# Patient Record
Sex: Female | Born: 1950 | Race: White | Hispanic: No | Marital: Married | Smoking: Never smoker
Health system: Southern US, Community
[De-identification: ages and names within clinical notes are randomized; demographics above are authoritative.]

## PROBLEM LIST (undated history)

## (undated) DIAGNOSIS — E119 Type 2 diabetes mellitus without complications: Secondary | ICD-10-CM

## (undated) DIAGNOSIS — IMO0001 Reserved for inherently not codable concepts without codable children: Secondary | ICD-10-CM

## (undated) DIAGNOSIS — M1990 Unspecified osteoarthritis, unspecified site: Secondary | ICD-10-CM

## (undated) DIAGNOSIS — I5189 Other ill-defined heart diseases: Secondary | ICD-10-CM

## (undated) DIAGNOSIS — F329 Major depressive disorder, single episode, unspecified: Secondary | ICD-10-CM

## (undated) DIAGNOSIS — F419 Anxiety disorder, unspecified: Secondary | ICD-10-CM

## (undated) DIAGNOSIS — Z789 Other specified health status: Secondary | ICD-10-CM

## (undated) DIAGNOSIS — I10 Essential (primary) hypertension: Secondary | ICD-10-CM

## (undated) DIAGNOSIS — G4730 Sleep apnea, unspecified: Secondary | ICD-10-CM

## (undated) DIAGNOSIS — G7000 Myasthenia gravis without (acute) exacerbation: Secondary | ICD-10-CM

## (undated) DIAGNOSIS — H269 Unspecified cataract: Secondary | ICD-10-CM

## (undated) DIAGNOSIS — F32A Depression, unspecified: Secondary | ICD-10-CM

## (undated) DIAGNOSIS — E785 Hyperlipidemia, unspecified: Secondary | ICD-10-CM

## (undated) DIAGNOSIS — T7840XA Allergy, unspecified, initial encounter: Secondary | ICD-10-CM

## (undated) DIAGNOSIS — K219 Gastro-esophageal reflux disease without esophagitis: Secondary | ICD-10-CM

## (undated) HISTORY — DX: Essential (primary) hypertension: I10

## (undated) HISTORY — PX: MANDIBLE SURGERY: SHX707

## (undated) HISTORY — DX: Unspecified osteoarthritis, unspecified site: M19.90

## (undated) HISTORY — DX: Gastro-esophageal reflux disease without esophagitis: K21.9

## (undated) HISTORY — PX: TONSILECTOMY, ADENOIDECTOMY, BILATERAL MYRINGOTOMY AND TUBES: SHX2538

## (undated) HISTORY — DX: Myasthenia gravis without (acute) exacerbation: G70.00

## (undated) HISTORY — PX: EYE SURGERY: SHX253

## (undated) HISTORY — PX: TUBAL LIGATION: SHX77

## (undated) HISTORY — PX: BUNIONECTOMY: SHX129

## (undated) HISTORY — DX: Anxiety disorder, unspecified: F41.9

## (undated) HISTORY — DX: Other specified health status: Z78.9

## (undated) HISTORY — DX: Allergy, unspecified, initial encounter: T78.40XA

## (undated) HISTORY — DX: Other ill-defined heart diseases: I51.89

## (undated) HISTORY — DX: Depression, unspecified: F32.A

## (undated) HISTORY — DX: Sleep apnea, unspecified: G47.30

## (undated) HISTORY — DX: Type 2 diabetes mellitus without complications: E11.9

## (undated) HISTORY — DX: Unspecified cataract: H26.9

## (undated) HISTORY — DX: Major depressive disorder, single episode, unspecified: F32.9

## (undated) HISTORY — PX: APPENDECTOMY: SHX54

## (undated) HISTORY — DX: Hyperlipidemia, unspecified: E78.5

## (undated) HISTORY — DX: Reserved for inherently not codable concepts without codable children: IMO0001

---

## 2014-02-25 LAB — HM COLONOSCOPY

## 2016-01-26 LAB — HM MAMMOGRAPHY

## 2017-08-15 ENCOUNTER — Encounter: Payer: Self-pay | Admitting: Family Medicine

## 2017-10-30 ENCOUNTER — Other Ambulatory Visit: Payer: Self-pay

## 2017-10-30 ENCOUNTER — Encounter: Payer: Self-pay | Admitting: Family Medicine

## 2017-10-30 ENCOUNTER — Ambulatory Visit (INDEPENDENT_AMBULATORY_CARE_PROVIDER_SITE_OTHER): Payer: Medicare Other | Admitting: Family Medicine

## 2017-10-30 VITALS — BP 134/72 | HR 70 | Temp 98.2°F | Resp 16 | Ht 63.0 in | Wt 199.0 lb

## 2017-10-30 DIAGNOSIS — E782 Mixed hyperlipidemia: Secondary | ICD-10-CM | POA: Diagnosis not present

## 2017-10-30 DIAGNOSIS — E119 Type 2 diabetes mellitus without complications: Secondary | ICD-10-CM | POA: Diagnosis not present

## 2017-10-30 DIAGNOSIS — I519 Heart disease, unspecified: Secondary | ICD-10-CM | POA: Diagnosis not present

## 2017-10-30 DIAGNOSIS — Z6835 Body mass index (BMI) 35.0-35.9, adult: Secondary | ICD-10-CM

## 2017-10-30 DIAGNOSIS — G4730 Sleep apnea, unspecified: Secondary | ICD-10-CM | POA: Diagnosis not present

## 2017-10-30 DIAGNOSIS — E1169 Type 2 diabetes mellitus with other specified complication: Secondary | ICD-10-CM | POA: Insufficient documentation

## 2017-10-30 DIAGNOSIS — D229 Melanocytic nevi, unspecified: Secondary | ICD-10-CM | POA: Diagnosis not present

## 2017-10-30 DIAGNOSIS — Z0000 Encounter for general adult medical examination without abnormal findings: Secondary | ICD-10-CM | POA: Diagnosis not present

## 2017-10-30 DIAGNOSIS — Z789 Other specified health status: Secondary | ICD-10-CM

## 2017-10-30 DIAGNOSIS — I10 Essential (primary) hypertension: Secondary | ICD-10-CM | POA: Diagnosis not present

## 2017-10-30 DIAGNOSIS — R8290 Unspecified abnormal findings in urine: Secondary | ICD-10-CM | POA: Diagnosis not present

## 2017-10-30 DIAGNOSIS — F339 Major depressive disorder, recurrent, unspecified: Secondary | ICD-10-CM | POA: Diagnosis not present

## 2017-10-30 DIAGNOSIS — R1310 Dysphagia, unspecified: Secondary | ICD-10-CM

## 2017-10-30 DIAGNOSIS — G7000 Myasthenia gravis without (acute) exacerbation: Secondary | ICD-10-CM | POA: Diagnosis not present

## 2017-10-30 DIAGNOSIS — E669 Obesity, unspecified: Secondary | ICD-10-CM | POA: Insufficient documentation

## 2017-10-30 DIAGNOSIS — IMO0001 Reserved for inherently not codable concepts without codable children: Secondary | ICD-10-CM | POA: Insufficient documentation

## 2017-10-30 DIAGNOSIS — I5189 Other ill-defined heart diseases: Secondary | ICD-10-CM | POA: Insufficient documentation

## 2017-10-30 DIAGNOSIS — E785 Hyperlipidemia, unspecified: Secondary | ICD-10-CM | POA: Insufficient documentation

## 2017-10-30 LAB — URINALYSIS, ROUTINE W REFLEX MICROSCOPIC
BACTERIA UA: NONE SEEN /HPF
Bilirubin Urine: NEGATIVE
GLUCOSE, UA: NEGATIVE
HGB URINE DIPSTICK: NEGATIVE
KETONES UR: NEGATIVE
NITRITE: NEGATIVE
PH: 7 (ref 5.0–8.0)
PROTEIN: NEGATIVE
RBC / HPF: NONE SEEN /HPF (ref 0–2)
Specific Gravity, Urine: 1.015 (ref 1.001–1.03)

## 2017-10-30 LAB — MICROSCOPIC MESSAGE

## 2017-10-30 MED ORDER — LOSARTAN POTASSIUM 25 MG PO TABS: 25.0000 mg | ORAL_TABLET | Freq: Every day | ORAL | 1 refills | Status: DC

## 2017-10-30 MED ORDER — BUPROPION HCL ER (XL) 150 MG PO TB24: ORAL_TABLET | ORAL | 1 refills | Status: DC

## 2017-10-30 MED ORDER — METFORMIN HCL ER 500 MG PO TB24: 500.0000 mg | ORAL_TABLET | Freq: Every day | ORAL | 1 refills | Status: DC

## 2017-10-30 MED ORDER — METOPROLOL SUCCINATE ER 25 MG PO TB24: 25.0000 mg | ORAL_TABLET | Freq: Every day | ORAL | 3 refills | Status: DC

## 2017-10-30 MED ORDER — HYOSCYAMINE SULFATE 0.125 MG SL SUBL
0.1250 mg | SUBLINGUAL_TABLET | Freq: Four times a day (QID) | SUBLINGUAL | 0 refills | Status: DC | PRN
Start: 2017-10-30 — End: 2018-02-06

## 2017-10-30 MED ORDER — BLOOD GLUCOSE SYSTEM PAK KIT: PACK | 1 refills | Status: DC

## 2017-10-30 MED ORDER — BLOOD GLUCOSE TEST VI STRP
ORAL_STRIP | 1 refills | Status: DC
Start: 2017-10-30 — End: 2018-04-07

## 2017-10-30 NOTE — Assessment & Plan Note (Signed)
Sleep apnea referral to the pulmonary continue with CPAP therapy.

## 2017-10-30 NOTE — Patient Instructions (Addendum)
Previous PCP - [facility]- Dr. Donneta [facility]berg[facility] [place] We will call with urine results  Referral to GI  Referral to Dermatology  Dr. [place] Crane  Referral to Dr. Halford Chessman ( April)  New diabetes meter  email your [email or web address] F/U 3 months

## 2017-10-30 NOTE — Assessment & Plan Note (Signed)
Diabetes mellitus we will check her fasting fasting labs.  She is currently on metformin.  She is open to going back on statin if needed.  She does have underlying hyperlipidemia.

## 2017-10-30 NOTE — Progress Notes (Signed)
Subjective:   Patient presents for Medicare Annual/Subsequent preventive examination.    Pt here for annual wellness and to establish care  Previous PCP - [name]- [facility] [name]   Cardiologist- Diagnosed with diastolic dysfunction, after episodes of chest pain, had Echo and stress test which did not show any CAD.  Was prescribed losartan and Metoprolol. Also has mild HTN, diagnosed 3 years ago      Neurology-  Seen due to Diplopia, Myasthenia Gravis diagnosed 4-5 years, was on Mestinon but stopped this year, wants to see how vision has been . Has been following with eye doctor, now has appt to establish with Dr. [name] in [place], also has dry syndrome   Over past 1-2 years, had had episodes of spasm in throat, will often choke on food or her pills  has not had EGD performed.       Dermatology- Gets skin check every 2 years, no previous cancer   Gastroenterology- Had colonoscopy  In past few years, has history of polyps, no family history of colon cancer, has chronic rectal pain, has internal hemorrhoids, given  hyoscyomine  Takes magnesium tablets for constipation     Diabetes Mellitus- taking Metformin 500mg  once a day , CBG range 135, needs new meter Hyperlipidemia- has known very high Triglycerides, comes down with diet, runs in family  Was on Livalo, stopped due to concern for side effects    Has had some cloudiness with urine and urine feels warm   Depression- intermittantly on wellbutrin for past 5 years for stressors ,currently taking     OSA- has Sleep apnea on CPAP ,needs referral to Dr. [name] on toe   History of Bells Palsy   Prevention- does Thermogram for mammography, has history cyst   Previous PAP Smears were normal , last done 1-2 years ago    Bone Density- UTD   Immunizations Declines   Review Past Medical/Family/Social: Per EMR   Risk Factors  Current exercise habits: walking  Dietary issues discussed: Yes   Cardiac risk  factors: Obesity (BMI >= 30 kg/m2). DM. Diastolic dysfunction   Depression Screen  (Note: if answer to either of the following is "Yes", a more complete depression screening is indicated)  Over the past two weeks, have you felt down, depressed or hopeless? No Over the past two weeks, have you felt little interest or pleasure in doing things? No Have you lost interest or pleasure in daily life? No Do you often feel hopeless? No Do you cry easily over simple problems? No   Activities of Daily Living  In your present state of health, do you have any difficulty performing the following activities?:  Driving? No  Managing money? No  Feeding yourself? No  Getting from bed to chair? No  Climbing a flight of stairs? No  Preparing food and eating?: No  Bathing or showering? No  Getting dressed: No  Getting to the toilet? No  Using the toilet:No  Moving around from place to place: No  In the past year have you fallen or had a near fall?:No  Are you sexually active? No  Do you have more than one partner? No   Hearing Difficulties: No  Do you often ask people to speak up or repeat themselves? No  Do you experience ringing or noises in your ears? No Do you have difficulty understanding soft or whispered voices? No  Do you feel that you have a problem with memory? No Do  you often misplace items? No  Do you feel safe at home? Yes  Cognitive Testing  Alert? Yes Normal Appearance?Yes  Oriented to person? Yes Place? Yes  Time? Yes  Recall of three objects? Yes  Can perform simple calculations? Yes  Displays appropriate judgment?Yes  Can read the correct time from a watch face?Yes      Screening Tests / Date  DECLINES SHOTS Colonoscopy UTD per patient                     Zostavax  Mammogram  Per above  Influenza Vaccine  Tetanus/tdap  ROS: GEN- denies fatigue, fever, weight loss,weakness, recent illness HEENT- denies eye drainage, change in vision, nasal discharge, CVS- denies  chest pain, palpitations RESP- denies SOB, cough, wheeze ABD- denies N/V, change in stools, abd pain GU- denies dysuria, hematuria, dribbling, incontinence MSK- denies joint pain, muscle aches, injury Neuro- denies headache, dizziness, syncope, seizure activity  PHYSICAL: GEN- NAD, alert and oriented x3 HEENT- PERRL, EOMI, non injected sclera, pink conjunctiva, MMM, oropharynx clear Neck- Supple, no thryomegaly CVS- RRR, no murmur RESP-CTAB ABD-NABS,soft,NT,ND Skin- multiple Nevi, Seb Ker on back Psych- normal affect and mood  EXT- No edema, Right foot- corn 3rd digit mild TTP Pulses- Radial, DP- 2+   Assessment:    Annual wellness medicare exam   Plan:    During the course of the visit the patient was educated and counseled about appropriate screening and preventive services including:  Obtain records from previous PCP Declines immunizations.  She does not like traditional mammograms therefore will be getting thermograms.  She is going to email me the results of her previous thermograms  Diabetes mellitus we will check her fasting fasting labs.  She is currently on metformin.  She is open to going back on statin if needed.  She does have underlying hyperlipidemia.  Hypertension blood pressure is controlled.  Diastolic dysfunction no sign of heart failure.  Depression mood is stable with the Wellbutrin will continue current dose.  Sleep apnea referral to the pulmonary continue with CPAP therapy.  Multiple nevi will set up with dermatologist for her yearly checks.  She is a corn on her toe we discussed going to podiatry she wants to hold off on this.  She is going to use moleskin she has already try the salicylic acid it has not helped.  Concerned about the dysphagia, possible releated to Myasthenia, but needs EGD , may have stricture, or barrets   Advanced directives placed on file, Jehovah Witness  Diet review for nutrition referral? Yes ____ Not Indicated __x__   Patient Instructions (the written plan) was given to the patient.  Medicare Attestation  I have personally reviewed:  The patient's medical and social history  Their use of alcohol, tobacco or illicit drugs  Their current medications and supplements  The patient's functional ability including ADLs,fall risks, home safety risks, cognitive, and hearing and visual impairment  Diet and physical activities  Evidence for depression or mood disorders  The patient's weight, height, BMI, and visual acuity have been recorded in the chart. I have made referrals, counseling, and provided education to the patient based on review of the above and I have provided the patient with a written personalized care plan for preventive services.

## 2017-10-30 NOTE — Assessment & Plan Note (Signed)
Depression mood is stable with the Wellbutrin will continue current dose.

## 2017-10-30 NOTE — Assessment & Plan Note (Signed)
Diastolic dysfunction no sign of heart failure.

## 2017-10-31 LAB — COMPREHENSIVE METABOLIC PANEL
AG RATIO: 2.1 (calc) (ref 1.0–2.5)
ALKALINE PHOSPHATASE (APISO): 74 U/L (ref 33–130)
ALT: 23 U/L (ref 6–29)
AST: 22 U/L (ref 10–35)
Albumin: 4.6 g/dL (ref 3.6–5.1)
BILIRUBIN TOTAL: 0.6 mg/dL (ref 0.2–1.2)
BUN: 10 mg/dL (ref 7–25)
CALCIUM: 9.6 mg/dL (ref 8.6–10.4)
CO2: 26 mmol/L (ref 20–32)
Chloride: 103 mmol/L (ref 98–110)
Creat: 0.88 mg/dL (ref 0.50–0.99)
Globulin: 2.2 g/dL (calc) (ref 1.9–3.7)
Glucose, Bld: 111 mg/dL — ABNORMAL HIGH (ref 65–99)
POTASSIUM: 4 mmol/L (ref 3.5–5.3)
SODIUM: 141 mmol/L (ref 135–146)
TOTAL PROTEIN: 6.8 g/dL (ref 6.1–8.1)

## 2017-10-31 LAB — CBC WITH DIFFERENTIAL/PLATELET
BASOS PCT: 0.9 %
Basophils Absolute: 50 cells/uL (ref 0–200)
EOS PCT: 2.8 %
Eosinophils Absolute: 157 cells/uL (ref 15–500)
HEMATOCRIT: 41.9 % (ref 35.0–45.0)
Hemoglobin: 14.6 g/dL (ref 11.7–15.5)
LYMPHS ABS: 1770 {cells}/uL (ref 850–3900)
MCH: 31.8 pg (ref 27.0–33.0)
MCHC: 34.8 g/dL (ref 32.0–36.0)
MCV: 91.3 fL (ref 80.0–100.0)
MPV: 9.6 fL (ref 7.5–12.5)
Monocytes Relative: 9.1 %
NEUTROS ABS: 3114 {cells}/uL (ref 1500–7800)
NEUTROS PCT: 55.6 %
Platelets: 231 10*3/uL (ref 140–400)
RBC: 4.59 10*6/uL (ref 3.80–5.10)
RDW: 12.7 % (ref 11.0–15.0)
Total Lymphocyte: 31.6 %
WBC mixed population: 510 cells/uL (ref 200–950)
WBC: 5.6 10*3/uL (ref 3.8–10.8)

## 2017-10-31 LAB — URINE CULTURE
MICRO NUMBER: 81463529
SPECIMEN QUALITY: ADEQUATE

## 2017-10-31 LAB — HEMOGLOBIN A1C
Hgb A1c MFr Bld: 5.8 % of total Hgb — ABNORMAL HIGH (ref <5.7)
Mean Plasma Glucose: 120 (calc)
eAG (mmol/L): 6.6 (calc)

## 2017-10-31 LAB — SPECIMEN COMPROMISED

## 2017-10-31 LAB — LIPID PANEL
CHOL/HDL RATIO: 5.3 (calc) — AB (ref <5.0)
Cholesterol: 210 mg/dL — ABNORMAL HIGH (ref <200)
HDL: 40 mg/dL — ABNORMAL LOW (ref >50)
LDL CHOLESTEROL (CALC): 116 mg/dL — AB
NON-HDL CHOLESTEROL (CALC): 170 mg/dL — AB (ref <130)
TRIGLYCERIDES: 377 mg/dL — AB (ref <150)

## 2017-11-01 ENCOUNTER — Other Ambulatory Visit: Payer: Self-pay | Admitting: *Deleted

## 2017-11-01 MED ORDER — PITAVASTATIN CALCIUM 2 MG PO TABS: 1.0000 | ORAL_TABLET | Freq: Two times a day (BID) | ORAL | Status: DC

## 2017-11-02 ENCOUNTER — Encounter (INDEPENDENT_AMBULATORY_CARE_PROVIDER_SITE_OTHER): Payer: Self-pay | Admitting: Internal Medicine

## 2017-11-03 ENCOUNTER — Encounter: Payer: Self-pay | Admitting: Family Medicine

## 2017-11-07 DIAGNOSIS — H5213 Myopia, bilateral: Secondary | ICD-10-CM | POA: Diagnosis not present

## 2017-11-07 DIAGNOSIS — E119 Type 2 diabetes mellitus without complications: Secondary | ICD-10-CM | POA: Diagnosis not present

## 2017-11-07 DIAGNOSIS — Z7984 Long term (current) use of oral hypoglycemic drugs: Secondary | ICD-10-CM | POA: Diagnosis not present

## 2017-11-07 DIAGNOSIS — H52203 Unspecified astigmatism, bilateral: Secondary | ICD-10-CM | POA: Diagnosis not present

## 2017-11-07 DIAGNOSIS — H2513 Age-related nuclear cataract, bilateral: Secondary | ICD-10-CM | POA: Diagnosis not present

## 2017-11-16 ENCOUNTER — Encounter: Payer: Self-pay | Admitting: *Deleted

## 2017-11-16 DIAGNOSIS — Z1283 Encounter for screening for malignant neoplasm of skin: Secondary | ICD-10-CM | POA: Diagnosis not present

## 2017-11-16 DIAGNOSIS — M7130 Other bursal cyst, unspecified site: Secondary | ICD-10-CM | POA: Diagnosis not present

## 2017-11-16 DIAGNOSIS — D225 Melanocytic nevi of trunk: Secondary | ICD-10-CM | POA: Diagnosis not present

## 2017-11-16 DIAGNOSIS — L820 Inflamed seborrheic keratosis: Secondary | ICD-10-CM | POA: Diagnosis not present

## 2017-12-12 ENCOUNTER — Encounter: Payer: Self-pay | Admitting: Family Medicine

## 2017-12-12 ENCOUNTER — Ambulatory Visit (INDEPENDENT_AMBULATORY_CARE_PROVIDER_SITE_OTHER): Payer: Medicare Other | Admitting: Family Medicine

## 2017-12-12 ENCOUNTER — Other Ambulatory Visit: Payer: Self-pay

## 2017-12-12 ENCOUNTER — Ambulatory Visit (HOSPITAL_COMMUNITY)
Admission: RE | Admit: 2017-12-12 | Discharge: 2017-12-12 | Disposition: A | Discharge status: Home / Self Care | Payer: Medicare Other | Source: Ambulatory Visit | Attending: Family Medicine | Admitting: Family Medicine

## 2017-12-12 VITALS — BP 130/72 | HR 82 | Temp 98.1°F | Resp 16 | Ht 63.0 in | Wt 199.0 lb

## 2017-12-12 DIAGNOSIS — R0781 Pleurodynia: Secondary | ICD-10-CM | POA: Insufficient documentation

## 2017-12-12 DIAGNOSIS — R0789 Other chest pain: Secondary | ICD-10-CM

## 2017-12-12 MED ORDER — TIZANIDINE HCL 4 MG PO TABS: 4.0000 mg | ORAL_TABLET | Freq: Four times a day (QID) | ORAL | 0 refills | Status: DC | PRN

## 2017-12-12 MED ORDER — NAPROXEN 500 MG PO TABS
500.0000 mg | ORAL_TABLET | Freq: Two times a day (BID) | ORAL | 0 refills | Status: DC
Start: 2017-12-12 — End: 2017-12-19

## 2017-12-12 NOTE — Patient Instructions (Signed)
Get xray of ribs/lungs

## 2017-12-12 NOTE — Progress Notes (Signed)
    Subjective:    Patient ID: [name], female    DOB: May 15, 1951, 67 y.o.   MRN: [id number]  Patient presents for Rib Pain (x1week- mostly on L side, but is on R side as well)  Left side and back pain for the past 3 weeks, mostly over her ribs,  past few days pain has been constant   No fall, npo injury, , minimal coughing She has also had intermittant pain on right side in same region   No OTC meds taken , no use of heat or ice   She does get some pain when she takes a deep breath in the same area     Review Of Systems:  GEN- denies fatigue, fever, weight loss,weakness, recent illness HEENT- denies eye drainage, change in vision, nasal discharge, CVS- denies chest pain, palpitations RESP- denies SOB, cough, wheeze ABD- denies N/V, change in stools, abd pain GU- denies dysuria, hematuria, dribbling, incontinence MSK- + joint pain, muscle aches, injury Neuro- denies headache, dizziness, syncope, seizure activity       Objective:    BP 130/72   Pulse 82   Temp 98.1 F (36.7 C) (Oral)   Resp 16   Ht 5\' 3"  (1.6 m)   Wt 199 lb (90.3 kg)   SpO2 98%   BMI 35.25 kg/m  GEN- NAD, alert and oriented x3 CVS- RRR, no murmur RESP-CTAB MSK- TTP post left lower ribs around T10, point tenderness posteriorly, mild TTP anterior along same bone, non tender on right side, ABD-NABS,soft,NT,ND MSK- Spine NT, neg SLR EXT- No edema Pulses- Radial 2+        Assessment & Plan:      Problem List Items Addressed This Visit    None    Visit Diagnoses    Rib pain on left side    -  Primary   MSK pain greater on left side with point tenderness and no injury, obtain Xray of ribs, with pain with SOB, may be some spliting, will view lungs as well, treatment pending evaluation   Relevant Orders   DG Ribs Unilateral Left   DG Chest 2 View   Pain, chest wall       Relevant Orders   DG Chest 2 View      Note: This dictation was prepared with Dragon dictation along with  smaller phrase technology. Any transcriptional errors that result from this process are unintentional.

## 2017-12-12 NOTE — Addendum Note (Signed)
Addended by: Vic Blackbird F on: 12/12/2017 05:14 PM   Modules accepted: Orders

## 2017-12-19 ENCOUNTER — Ambulatory Visit (INDEPENDENT_AMBULATORY_CARE_PROVIDER_SITE_OTHER): Payer: Medicare Other | Admitting: Internal Medicine

## 2017-12-19 ENCOUNTER — Encounter (INDEPENDENT_AMBULATORY_CARE_PROVIDER_SITE_OTHER): Payer: Self-pay | Admitting: Internal Medicine

## 2017-12-19 ENCOUNTER — Encounter (INDEPENDENT_AMBULATORY_CARE_PROVIDER_SITE_OTHER): Payer: Self-pay | Admitting: *Deleted

## 2017-12-19 VITALS — BP 130/94 | HR 66 | Temp 98.1°F | Resp 18 | Ht 63.0 in | Wt 198.3 lb

## 2017-12-19 DIAGNOSIS — R1319 Other dysphagia: Secondary | ICD-10-CM

## 2017-12-19 DIAGNOSIS — R1310 Dysphagia, unspecified: Secondary | ICD-10-CM

## 2017-12-19 NOTE — Progress Notes (Signed)
Reason for consultation;  Throat spasm and dysphagia.  History of present illness:   Patient is 67 year old Caucasian female who is referred through courtesy of Dr. Vic Blackbird for GI evaluation. Patient states her symptoms started 1 year ago.  She has no difficulty with breakfast or lunch but at dinnertime she feels as if she has throat spasm and she is not able to swallow.  She has had few episodes of impaction relieved with regurgitation.  Usually food bolus passes down.  She experiences heartburn occasionally.  She denies chronic cough hoarseness or sore throat.  She has good appetite and has not lost any weight.  She denies melena or rectal bleeding.  Her bowels move daily. She has lost 15 pounds in last 1 year by trying. She has history of myasthenia gravis.  She was on therapy previous to be a presently on none.  She denies progressive weakness. She has a history of colonic polyps.  Her third colonoscopy was April 2015.   She takes hyoscyamine for rectal spasm. She is physically very active.  She walks 6-10 miles every week.   Current Medications: Outpatient Encounter Medications as of 12/19/2017  Medication Sig  . BIOTIN PO Take 2,500 mcg by mouth 2 (two) times daily.  . Blood Glucose Monitoring Suppl (BLOOD GLUCOSE SYSTEM PAK) KIT Use as directed to monitor FSBS 2x daily. Dx: E11.9  . buPROPion (WELLBUTRIN XL) 150 MG 24 hr tablet Wellbutrin XL 150 mg 24 hr tablet, extended release (Patient taking differently: Take 150 mg by mouth daily. Wellbutrin XL 150 mg 24 hr tablet, extended release)  . Cholecalciferol (VITAMIN D3) 1000 units CAPS Take by mouth daily.  . Glucose Blood (BLOOD GLUCOSE TEST STRIPS) STRP Use as directed to monitor FSBS 2x daily. Dx: E11.9  . hyoscyamine (LEVSIN SL) 0.125 MG SL tablet Place 1 tablet (0.125 mg total) under the tongue every 6 (six) hours as needed.  Marland Kitchen losartan (COZAAR) 25 MG tablet Take 1 tablet (25 mg total) by mouth daily.  . metoprolol succinate  (TOPROL-XL) 25 MG 24 hr tablet Take 1 tablet (25 mg total) by mouth daily.  . Multiple Vitamins-Minerals (MULTI COMPLETE PO) Take by mouth daily.  . NON FORMULARY Take by mouth daily.  . NON FORMULARY Take by mouth daily.  . NON FORMULARY Take by mouth.  . NON FORMULARY Take by mouth daily.  . NON FORMULARY Take by mouth daily.  . NON FORMULARY Take by mouth daily.  . Pitavastatin Calcium (LIVALO) 2 MG TABS Take 1 tablet (2 mg total) by mouth 2 (two) times daily.  Marland Kitchen tiZANidine (ZANAFLEX) 4 MG tablet Take 1 tablet (4 mg total) by mouth every 6 (six) hours as needed for muscle spasms. (Patient not taking: Reported on 12/19/2017)  . [DISCONTINUED] naproxen (NAPROSYN) 500 MG tablet Take 1 tablet (500 mg total) by mouth 2 (two) times daily with a meal. (Patient not taking: Reported on 12/19/2017)   No facility-administered encounter medications on file as of 12/19/2017.    Past medical history:  Diabetes mellitus diagnosed 10 years ago.  She is presently doing well on diet.  Recent A1c was 5.8. Myasthenia gravis was diagnosed 5 years ago when she presented with diplopia.  She is been off therapy for 8 months. Sleep apnea diagnosed 15 years ago. History of depression. Hypertension. Hyperlipidemia. Diastolic dysfunction.   Obesity. IBS/rectal spasm. History of colonic adenomas.  Appendectomy at age 78. Tonsillectomy at age 71. Bilateral bunionectomy 20 years ago. She also has undergone gum  surgery.   Allergies: Allergies  Allergen Reactions  . Prozac [Fluoxetine Hcl] Anaphylaxis  . Codeine Other (See Comments)    Intolerance   . Other     -mycin medications: has myasthenia gravis   . Sulfa Antibiotics Hives    Family history:   Father died of MI at age 67.. Mother lived to be 40.  She had chronic kidney disease and CHF. She has 2 brothers living.  One brother has type 1 diabetes.  One brother has been treated for lung carcinoma.  Third brother is disease.  He was  diabetic.  Social history:  Patient is married.  She has 4 children.  1 of her daughters has multiple health issues including diabetes mellitus.  She retired last year.  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  She has never smoked cigarettes and drinks alcohol occasionally.   Physical examination:  Blood pressure (!) 130/94, pulse 66, temperature 98.1 F (36.7 C), temperature source Oral, resp. rate 18, height '5\' 3"'$  (1.6 m), weight 198 lb 4.8 oz (89.9 kg). Patient is alert and in no acute distress. Conjunctiva is pink. Sclera is nonicteric Oropharyngeal mucosa is normal. Dentition in satisfactory condition. No neck masses or thyromegaly noted. Cardiac exam with regular rhythm normal S1 and S2. No murmur or gallop noted. Lungs are clear to auscultation. Abdomen is full.  Bowel sounds are normal.  On palpation abdomen is soft and nontender without organomegaly or masses. No LE edema or clubbing noted.  Labs/studies Results:  Lab data from 10/30/2017 reviewed WBC 5.6, H&H 14.6 and 41.9 and platelet count 231K.  Serum sodium 141, potassium 4.0, chloride 103, CO2 26, calcium 9.6 Bilirubin 0.6, AP 74, AST 22, ALT 23, total protein 6.8 with albumin of 4.6.  Hemoglobin A1c 5.9.   Assessment:  Patient is 67 year old Caucasian female who presents with 1 year history of throat spasm and dysphagia which almost always occurs with the evening meal and not with breakfast or lunch if any.  She does not have symptoms of GERD.  She has history of myasthenia gravis and presently on no therapy but not having any musculoskeletal or pharyngeal symptoms.  Nevertheless her pharyngeal/esophageal symptom may be manifestation of myasthenia gravis.  Less likely would be esophageal motility disorder or Zenker's diverticulum.  History of colonic polyps.  Last colonoscopy was in April  History of colonic polyps.  Last colonoscopy was in April 2015.Marland Kitchen  Next surveillance colonoscopy due in April  2020.   Recommendations:  Barium pill esophagogram. Further recommendations to follow.

## 2017-12-19 NOTE — Patient Instructions (Signed)
Barium Pill Esophagogram be scheduled. Physician will call with results of study and further recommendations.

## 2017-12-29 ENCOUNTER — Ambulatory Visit (HOSPITAL_COMMUNITY)
Admission: RE | Admit: 2017-12-29 | Discharge: 2017-12-29 | Disposition: A | Discharge status: Home / Self Care | Payer: Medicare Other | Source: Ambulatory Visit | Attending: Internal Medicine | Admitting: Internal Medicine

## 2017-12-29 DIAGNOSIS — K224 Dyskinesia of esophagus: Secondary | ICD-10-CM | POA: Diagnosis not present

## 2017-12-29 DIAGNOSIS — R1310 Dysphagia, unspecified: Secondary | ICD-10-CM | POA: Diagnosis not present

## 2017-12-29 DIAGNOSIS — R1319 Other dysphagia: Secondary | ICD-10-CM

## 2017-12-29 DIAGNOSIS — T17308A Unspecified foreign body in larynx causing other injury, initial encounter: Secondary | ICD-10-CM | POA: Diagnosis not present

## 2018-01-01 ENCOUNTER — Other Ambulatory Visit (INDEPENDENT_AMBULATORY_CARE_PROVIDER_SITE_OTHER): Payer: Self-pay | Admitting: Internal Medicine

## 2018-01-01 MED ORDER — PANTOPRAZOLE SODIUM 40 MG PO TBEC: 40.0000 mg | DELAYED_RELEASE_TABLET | Freq: Every day | ORAL | 5 refills | Status: DC

## 2018-01-29 ENCOUNTER — Ambulatory Visit: Payer: Medicare Other | Admitting: Family Medicine

## 2018-02-06 ENCOUNTER — Encounter: Payer: Self-pay | Admitting: Family Medicine

## 2018-02-06 ENCOUNTER — Other Ambulatory Visit: Payer: Self-pay

## 2018-02-06 ENCOUNTER — Ambulatory Visit (INDEPENDENT_AMBULATORY_CARE_PROVIDER_SITE_OTHER): Payer: Medicare Other | Admitting: Family Medicine

## 2018-02-06 VITALS — BP 128/64 | HR 80 | Temp 98.3°F | Resp 14 | Ht 63.0 in | Wt 200.0 lb

## 2018-02-06 DIAGNOSIS — F339 Major depressive disorder, recurrent, unspecified: Secondary | ICD-10-CM | POA: Diagnosis not present

## 2018-02-06 DIAGNOSIS — E119 Type 2 diabetes mellitus without complications: Secondary | ICD-10-CM

## 2018-02-06 DIAGNOSIS — Z6835 Body mass index (BMI) 35.0-35.9, adult: Secondary | ICD-10-CM | POA: Diagnosis not present

## 2018-02-06 DIAGNOSIS — K224 Dyskinesia of esophagus: Secondary | ICD-10-CM | POA: Diagnosis not present

## 2018-02-06 DIAGNOSIS — I10 Essential (primary) hypertension: Secondary | ICD-10-CM | POA: Diagnosis not present

## 2018-02-06 DIAGNOSIS — E782 Mixed hyperlipidemia: Secondary | ICD-10-CM | POA: Diagnosis not present

## 2018-02-06 DIAGNOSIS — R5383 Other fatigue: Secondary | ICD-10-CM | POA: Diagnosis not present

## 2018-02-06 NOTE — Progress Notes (Signed)
   Subjective:    Patient ID: [name], female    DOB: 05-Dec-1950, 67 y.o.   MRN: [id number]  Patient presents for Follow-up (is not fasting)  Here to follow-up chronic medical problems.  She establish care back in December Diabetes mellitus her last A1c was 5.8% in January so I discontinue the metformin she is not on any current medications is diet controlled  Hypertension blood pressure has been well controlled she is taking losartan 25 mg as well as metoprolol  Depression she is maintained on Wellbutrin which helps, but always tired, daughter has thyroid cancer, would like labs done  Lipidemia she is taking Livalo as prescribed now, last LDL 116 TG 377 in Jan   Eye exam UTD- Dr. Griffin Dakin by Dr. Laural Golden for eshopheal dysmotlity Given Protonix made her nauseous   Has appt in a few weeks for Sleep apnea   Thermogram done for breast cancer screening    Review Of Systems:  GEN- +fatigue, denies  fever, weight loss,weakness, recent illness HEENT- denies eye drainage, change in vision, nasal discharge, CVS- denies chest pain, palpitations RESP- denies SOB, cough, wheeze ABD- denies N/V, change in stools, abd pain GU- denies dysuria, hematuria, dribbling, incontinence MSK- denies joint pain, muscle aches, injury Neuro- denies headache, dizziness, syncope, seizure activity       Objective:    BP 128/64   Pulse 80   Temp 98.3 F (36.8 C) (Oral)   Resp 14   Ht 5\' 3"  (1.6 m)   Wt 200 lb (90.7 kg)   SpO2 97%   BMI 35.43 kg/m  GEN- NAD, alert and oriented x3 HEENT- PERRL, EOMI, non injected sclera, pink conjunctiva, MMM, oropharynx clear Neck- Supple, no thyromegaly CVS- RRR, no murmur RESP-CTAB Psych- normal affect and mood  EXT- No edema Pulses- Radial, DP- 2+        Assessment & Plan:   She will get me a copy of colonoscopy report    Problem List Items Addressed This Visit      Unprioritized   Hyperlipidemia   Relevant Orders   Lipid panel   Obesity    Continue work on dietary change,s lower carb, would benefit from some weight loss      Hypertension    Controlled For fatigue will check labs       Relevant Orders   TSH   Esophageal dysmotility    Advised she can try zantac until she contacts her GI      Diabetes mellitus, type II ([place]) - Primary    Obtain A1C off the metformin, and lipids, return for fasting labs       Relevant Orders   CBC with Differential/Platelet   Comprehensive metabolic panel   Hemoglobin A1c   [facility] DIABETES FOOT EXAM (Completed)   Depression, recurrent ([place])    Continue wellbutrin overall doing well       Other Visit Diagnoses    Other fatigue       Relevant Orders   TSH   T3, reverse   T3, free   T4, free      Note: This dictation was prepared with Dragon dictation along with smaller phrase technology. Any transcriptional errors that result from this process are unintentional.

## 2018-02-06 NOTE — Patient Instructions (Addendum)
Try the zantac 150mg  Return for fasting labs  We will do prior authorization for the wellbutrin F/U 4 months

## 2018-02-07 ENCOUNTER — Encounter: Payer: Self-pay | Admitting: Family Medicine

## 2018-02-07 ENCOUNTER — Other Ambulatory Visit: Payer: Medicare Other

## 2018-02-07 DIAGNOSIS — I10 Essential (primary) hypertension: Secondary | ICD-10-CM | POA: Diagnosis not present

## 2018-02-07 DIAGNOSIS — E782 Mixed hyperlipidemia: Secondary | ICD-10-CM | POA: Diagnosis not present

## 2018-02-07 DIAGNOSIS — R5383 Other fatigue: Secondary | ICD-10-CM | POA: Diagnosis not present

## 2018-02-07 DIAGNOSIS — K224 Dyskinesia of esophagus: Secondary | ICD-10-CM | POA: Insufficient documentation

## 2018-02-07 DIAGNOSIS — E119 Type 2 diabetes mellitus without complications: Secondary | ICD-10-CM | POA: Diagnosis not present

## 2018-02-07 NOTE — Assessment & Plan Note (Signed)
Controlled For fatigue will check labs

## 2018-02-07 NOTE — Assessment & Plan Note (Signed)
Advised she can try zantac until she contacts her GI

## 2018-02-07 NOTE — Assessment & Plan Note (Signed)
Continue work on dietary change,s lower carb, would benefit from some weight loss

## 2018-02-07 NOTE — Assessment & Plan Note (Signed)
Continue wellbutrin overall doing well

## 2018-02-07 NOTE — Assessment & Plan Note (Signed)
Obtain A1C off the metformin, and lipids, return for fasting labs

## 2018-02-10 LAB — COMPREHENSIVE METABOLIC PANEL
AG Ratio: 2.9 (calc) — ABNORMAL HIGH (ref 1.0–2.5)
ALT: 27 U/L (ref 6–29)
AST: 24 U/L (ref 10–35)
Albumin: 4.7 g/dL (ref 3.6–5.1)
Alkaline phosphatase (APISO): 71 U/L (ref 33–130)
BUN: 11 mg/dL (ref 7–25)
CALCIUM: 9.4 mg/dL (ref 8.6–10.4)
CO2: 30 mmol/L (ref 20–32)
Chloride: 103 mmol/L (ref 98–110)
Creat: 0.85 mg/dL (ref 0.50–0.99)
Globulin: 1.6 g/dL (calc) — ABNORMAL LOW (ref 1.9–3.7)
Glucose, Bld: 106 mg/dL — ABNORMAL HIGH (ref 65–99)
Potassium: 4.5 mmol/L (ref 3.5–5.3)
SODIUM: 141 mmol/L (ref 135–146)
TOTAL PROTEIN: 6.3 g/dL (ref 6.1–8.1)
Total Bilirubin: 0.5 mg/dL (ref 0.2–1.2)

## 2018-02-10 LAB — CBC WITH DIFFERENTIAL/PLATELET
Basophils Absolute: 51 cells/uL (ref 0–200)
Basophils Relative: 0.8 %
EOS PCT: 2 %
Eosinophils Absolute: 128 cells/uL (ref 15–500)
HCT: 41.9 % (ref 35.0–45.0)
Hemoglobin: 14.7 g/dL (ref 11.7–15.5)
LYMPHS ABS: 2022 {cells}/uL (ref 850–3900)
MCH: 32.4 pg (ref 27.0–33.0)
MCHC: 35.1 g/dL (ref 32.0–36.0)
MCV: 92.3 fL (ref 80.0–100.0)
MPV: 9.4 fL (ref 7.5–12.5)
Monocytes Relative: 8.6 %
NEUTROS PCT: 57 %
Neutro Abs: 3648 cells/uL (ref 1500–7800)
PLATELETS: 240 10*3/uL (ref 140–400)
RBC: 4.54 10*6/uL (ref 3.80–5.10)
RDW: 12.4 % (ref 11.0–15.0)
Total Lymphocyte: 31.6 %
WBC mixed population: 550 cells/uL (ref 200–950)
WBC: 6.4 10*3/uL (ref 3.8–10.8)

## 2018-02-10 LAB — T3, FREE: T3, Free: 2.7 pg/mL (ref 2.3–4.2)

## 2018-02-10 LAB — T4, FREE: Free T4: 1 ng/dL (ref 0.8–1.8)

## 2018-02-10 LAB — TSH: TSH: 3.63 m[IU]/L (ref 0.40–4.50)

## 2018-02-10 LAB — HEMOGLOBIN A1C
EAG (MMOL/L): 7 (calc)
HEMOGLOBIN A1C: 6 %{Hb} — AB (ref <5.7)
MEAN PLASMA GLUCOSE: 126 (calc)

## 2018-02-10 LAB — LIPID PANEL
Cholesterol: 164 mg/dL (ref <200)
HDL: 37 mg/dL — ABNORMAL LOW (ref >50)
Non-HDL Cholesterol (Calc): 127 mg/dL (calc) (ref <130)
Total CHOL/HDL Ratio: 4.4 (calc) (ref <5.0)
Triglycerides: 501 mg/dL — ABNORMAL HIGH (ref <150)

## 2018-02-10 LAB — T3, REVERSE: T3, Reverse: 17 ng/dL (ref 8–25)

## 2018-02-13 ENCOUNTER — Other Ambulatory Visit: Payer: Self-pay | Admitting: *Deleted

## 2018-02-13 ENCOUNTER — Telehealth (INDEPENDENT_AMBULATORY_CARE_PROVIDER_SITE_OTHER): Payer: Self-pay | Admitting: Internal Medicine

## 2018-02-13 MED ORDER — ICOSAPENT ETHYL 1 G PO CAPS: 2.0000 | ORAL_CAPSULE | Freq: Two times a day (BID) | ORAL | 3 refills | Status: DC

## 2018-02-13 NOTE — Telephone Encounter (Signed)
Patient came by office stated she needs a refill on her acid reflux medication - please call [phone]

## 2018-02-15 NOTE — Telephone Encounter (Signed)
Per Dr.Rehman the patient may try Zantac 150mg  - take 1 by mouth twice a day. Patient was called and made aware.

## 2018-02-20 ENCOUNTER — Ambulatory Visit: Payer: Medicare Other | Admitting: Pulmonary Disease

## 2018-02-20 ENCOUNTER — Encounter: Payer: Self-pay | Admitting: Pulmonary Disease

## 2018-02-20 VITALS — BP 120/86 | HR 81 | Ht 63.0 in | Wt 200.8 lb

## 2018-02-20 DIAGNOSIS — G4733 Obstructive sleep apnea (adult) (pediatric): Secondary | ICD-10-CM

## 2018-02-20 NOTE — Patient Instructions (Signed)
Will get a copy of your sleep study from [place]  Follow up in 1 year

## 2018-02-20 NOTE — Progress Notes (Signed)
[facility]  Chief Complaint  Patient presents with  . sleep consult    Pt referred by Dr. Buelah Manis MD. Pt is currently on CPAP machine, needing established since moved from [name];    Vital signs: BP 120/86 (BP Location: Left Arm, Cuff Size: Normal)   Pulse 81   Ht _0  (1.6 m)   Wt 200 lb 12.8 oz (91.1 kg)   SpO2 98%   BMI 35.57 kg/m   History of Present Illness: Carolyn [name] is a 67 y.o. female for evaluation of sleep problems.  She had sleep study while living in [place].  She was found to have sleep apnea and started on CPAP.  She needs to establish with new DME.  No issue with mask fit.  She goes to sleep between 10 and midnight.  She falls asleep after 20 minutes.  She wakes up 1 to 3  times to use the bathroom.  She takes a diuretic medication late in the evening  She gets out of bed at 7 am.  She feels tired in the morning.  She denies morning headache.  She does not use anything to help her fall sleep.  She drinks coffee in the morning to help get going.  She denies sleep walking, sleep talking, bruxism, or nightmares.  There is no history of restless legs.  She denies sleep hallucinations, sleep paralysis, or cataplexy.  The Epworth score is 10 out of 24.    Physical Exam:  General - pleasant Eyes - pupils reactive ENT - no sinus tenderness, no oral exudate, no LAN, MP 3 Cardiac - regular, no murmur Chest - no wheeze, rales Abd - soft, non tender Ext - no edema Skin - no rashes Neuro - normal strength Psych - normal mood  Assessment/Plan:  Obstructive sleep apnea. - she reports compliance with CPAP and benefit from therapy - need to set up new DME - will get copy of her CPAP download and sleep study report - We discussed how sleep apnea can affect various health problems, including risks for hypertension, cardiovascular disease, and diabetes.  We also discussed how sleep disruption can increase risks for accidents, such  as while driving.  Weight loss as a means of improving sleep apnea was also reviewed.  Additional treatment options discussed were CPAP therapy, oral appliance, and surgical intervention.  Hypertension. - advised her to try taking her diuretic medication earlier in the day so that she doesn't have to wake up as much to [place] the bathroom   Patient Instructions  Will get a copy of your sleep study from [place]  Follow up in 1 year    Chesley Mires, MD [place] 02/20/2018, 9:58 AM  Flow Sheet  Sleep tests:  Review of Systems: Constitutional: Negative for fever and unexpected weight change.  HENT: Positive for congestion. Negative for dental problem, ear pain, nosebleeds, postnasal drip, rhinorrhea, sinus pressure, sneezing, sore throat and trouble swallowing.   Eyes: Negative for redness and itching.  Respiratory: Negative for cough, chest tightness, shortness of breath and wheezing.   Cardiovascular: Negative for palpitations and leg swelling.  Gastrointestinal: Negative for nausea and vomiting.  Genitourinary: Positive for dysuria.  Musculoskeletal: Negative for joint swelling.  Skin: Negative for rash.  Allergic/Immunologic: Negative.  Negative for environmental allergies, food allergies and immunocompromised state.  Neurological: Positive for headaches.  Hematological: Does not bruise/bleed easily.  Psychiatric/Behavioral: Negative for dysphoric mood. The patient is nervous/anxious.    Past Medical  History: She  has a past medical history of Depression, Diabetes mellitus without complication ([place]), Diastolic dysfunction, Hyperlipidemia, Myasthenia gravis ([place]), Patient is Jehovah's Witness, and Sleep apnea.  Past Surgical History: She  has a past surgical history that includes Tonsilectomy, adenoidectomy, bilateral myringotomy and tubes; Appendectomy; Mandible surgery; and Bunionectomy.  Family History: Her family history includes Alcohol abuse in her  father; Arthritis in her mother; Depression in her mother; Diabetes in her brother, brother, brother, and mother; Heart disease in her brother; Heart disease (age of onset: 72) in her father; Hypertension in her father and mother.  Social History: She  reports that she has never smoked. She has never used smokeless tobacco. She reports that she drinks alcohol. She reports that she does not use drugs.  Medications: Allergies as of 02/20/2018      Reactions   Prozac [fluoxetine Hcl] Anaphylaxis   Codeine Other (See Comments)   Intolerance   Other    -mycin medications: has myasthenia gravis   Sulfa Antibiotics Hives      Medication List        Accurate as of 02/20/18  9:58 AM. Always use your most recent med list.          BIOTIN PO Take 2,500 mcg by mouth 2 (two) times daily.   Blood Glucose System Pak Kit Use as directed to monitor FSBS 2x daily. Dx: E11.9   BLOOD GLUCOSE TEST STRIPS Strp Use as directed to monitor FSBS 2x daily. Dx: E11.9   buPROPion 150 MG 24 hr tablet Commonly known as:  WELLBUTRIN XL Wellbutrin XL 150 mg 24 hr tablet, extended release   Icosapent Ethyl 1 g Caps Commonly known as:  VASCEPA Take 2 capsules (2 g total) by mouth 2 (two) times daily with a meal.   losartan 25 MG tablet Commonly known as:  COZAAR Take 1 tablet (25 mg total) by mouth daily.   metoprolol succinate 25 MG 24 hr tablet Commonly known as:  TOPROL-XL Take 1 tablet (25 mg total) by mouth daily.   MULTI COMPLETE PO Take by mouth daily.   NON FORMULARY Take by mouth daily.   NON FORMULARY Take by mouth daily.   NON FORMULARY Take by mouth.   NON FORMULARY Take by mouth daily.   NON FORMULARY Take by mouth daily.   NON FORMULARY Take by mouth daily.   Pitavastatin Calcium 2 MG Tabs Commonly known as:  LIVALO Take 1 tablet (2 mg total) by mouth 2 (two) times daily.   Vitamin D3 1000 units Caps Take by mouth daily.

## 2018-02-20 NOTE — Progress Notes (Signed)
   Subjective:    Patient ID: Carolyn [name], female    DOB: 1950/12/23, 67 y.o.   MRN: [id number]  HPI    Review of Systems  Constitutional: Negative for fever and unexpected weight change.  HENT: Positive for congestion. Negative for dental problem, ear pain, nosebleeds, postnasal drip, rhinorrhea, sinus pressure, sneezing, sore throat and trouble swallowing.   Eyes: Negative for redness and itching.  Respiratory: Negative for cough, chest tightness, shortness of breath and wheezing.   Cardiovascular: Negative for palpitations and leg swelling.  Gastrointestinal: Negative for nausea and vomiting.  Genitourinary: Positive for dysuria.  Musculoskeletal: Negative for joint swelling.  Skin: Negative for rash.  Allergic/Immunologic: Negative.  Negative for environmental allergies, food allergies and immunocompromised state.  Neurological: Positive for headaches.  Hematological: Does not bruise/bleed easily.  Psychiatric/Behavioral: Negative for dysphoric mood. The patient is nervous/anxious.        Objective:   Physical Exam        Assessment & Plan:

## 2018-02-21 ENCOUNTER — Encounter: Payer: Self-pay | Admitting: *Deleted

## 2018-02-23 ENCOUNTER — Telehealth: Payer: Self-pay | Admitting: Pulmonary Disease

## 2018-02-23 NOTE — Telephone Encounter (Signed)
Pt is returning call. Per pt, she had a HST done at a office in [place]. Pt states she has requested these records be faxed to our office. Cb is [phone].

## 2018-02-23 NOTE — Telephone Encounter (Signed)
Kelli have you received this HST? Please advise.

## 2018-02-23 NOTE — Telephone Encounter (Signed)
lmtcb for pt. Pt had consult with our office on 4/23 and I do not see where we ordered a HST or where a HST has been scanned into chart.

## 2018-02-24 ENCOUNTER — Telehealth: Payer: Self-pay | Admitting: Pulmonary Disease

## 2018-02-24 NOTE — Telephone Encounter (Signed)
CPAP 01/21/18 to 02/19/18 >> used on 30 of 30 nights with average 5 hrs 57 min.  Average AHI 0.9 with CPAP 7 cm H2O.   Please let her know that CPAP report shows good control of sleep apnea on current settings.

## 2018-02-26 NOTE — Telephone Encounter (Signed)
LMTCB x1 on preferred phone number listed for patient.  

## 2018-02-26 NOTE — Telephone Encounter (Signed)
Pt is calling back [phone]

## 2018-02-26 NOTE — Telephone Encounter (Signed)
Called and spoke with patient. She is aware. Nothing further needed.

## 2018-02-28 NOTE — Telephone Encounter (Signed)
LVM for patient regarding HST in [place]; at this time have not rec'd document at this time

## 2018-03-05 NOTE — Telephone Encounter (Signed)
LVM for patient to return call regarding we have not yet rec'd the HST [place] results at this time. X1

## 2018-03-05 NOTE — Telephone Encounter (Signed)
Kelli please advise on this, thanks.

## 2018-03-06 NOTE — Telephone Encounter (Signed)
Called and spoke with the patient regarding HST records. Pt advised to call [facility] at phone [phone]. Called and spoke with Janett Billow at [place]; with Dr. Justus Memory MD office Bennie Pierini that we are in need of copy of HST results and report for this patient. She will fax this information over to our office this week. Will follow up to see if we have rec'd this information later in the week.

## 2018-03-09 NOTE — Telephone Encounter (Signed)
No sign of HST in Dr Juanetta Gosling lookat or in [facility] - unable to speak with someone  Left detailed message informing medical records of [place] conversation earlier this week with Janett Billow at that office and that records still have not been received  Routing back to [place] to follow up

## 2018-03-12 NOTE — Telephone Encounter (Signed)
Called and spoke with [name] with [name] at phone [phone]. AdvisedSamantha that we are in need of copy of HST results from 2018 results & report for this patient. She advised that pt had in lab sleep study in 2006, and titration study in 2016. She will fax this information over to our office this morning.

## 2018-03-12 NOTE — Telephone Encounter (Signed)
rec'd fax from [facility] today regarding in lab study from 2006. Nothing further needed at this time.

## 2018-04-07 ENCOUNTER — Other Ambulatory Visit: Payer: Self-pay | Admitting: Family Medicine

## 2018-04-12 ENCOUNTER — Telehealth: Payer: Self-pay | Admitting: *Deleted

## 2018-04-12 NOTE — Telephone Encounter (Signed)
Received request from pharmacy for PA on Wellbutrin.  PA submitted.   Dx: F33.9- depression.

## 2018-04-12 NOTE — Telephone Encounter (Signed)
[facility] is reviewing your PA request. Typically an electronic response will be received within 72 hours. To check for an update later, open this request from your dashboard.    You may close this dialog and return to your dashboard to perform other tasks.

## 2018-04-16 MED ORDER — BUPROPION HCL 75 MG PO TABS: 75.0000 mg | ORAL_TABLET | Freq: Two times a day (BID) | ORAL | 3 refills | Status: DC

## 2018-04-16 NOTE — Telephone Encounter (Signed)
Call placed to patient and patient made aware.   Patient is agreeable to BID dosing with Wellbutrin SR.

## 2018-04-16 NOTE — Telephone Encounter (Signed)
Prescription sent to pharmacy.

## 2018-04-16 NOTE — Telephone Encounter (Signed)
wellbutrin 75mg  BID

## 2018-04-16 NOTE — Telephone Encounter (Signed)
Call pt, she is aware of the insurance problem The PA did not go through Does she want to keep paying for the xl OR change to the BID dosing

## 2018-04-16 NOTE — Telephone Encounter (Signed)
Received PA determination.   PA denied.   Formulary alternatives include: Bupropion SR Duloxetine Mirtazapine  MD please advise.

## 2018-04-18 ENCOUNTER — Other Ambulatory Visit: Payer: Self-pay

## 2018-04-18 ENCOUNTER — Telehealth: Payer: Self-pay | Admitting: *Deleted

## 2018-04-18 ENCOUNTER — Ambulatory Visit (INDEPENDENT_AMBULATORY_CARE_PROVIDER_SITE_OTHER): Payer: Medicare Other | Admitting: Family Medicine

## 2018-04-18 ENCOUNTER — Encounter: Payer: Self-pay | Admitting: Family Medicine

## 2018-04-18 VITALS — BP 118/68 | HR 70 | Temp 98.3°F | Resp 14 | Ht 63.0 in | Wt 203.0 lb

## 2018-04-18 DIAGNOSIS — F339 Major depressive disorder, recurrent, unspecified: Secondary | ICD-10-CM

## 2018-04-18 MED ORDER — WELLBUTRIN SR 100 MG PO TB12: 100.0000 mg | ORAL_TABLET | Freq: Two times a day (BID) | ORAL | 6 refills | Status: DC

## 2018-04-18 NOTE — Telephone Encounter (Signed)
Patient seen in office for medication management.   Appeal called to [facility] for Wellbutrin XL 150mg .

## 2018-04-18 NOTE — Progress Notes (Signed)
   Subjective:    Patient ID: Carolyn [name], female    DOB: 11/16/1950, 67 y.o.   MRN: [id number]  Patient presents for Medication Management (Welbutrin)  Here to follow-up her medications.  We have been having difficulty trying to get her brand-name Wellbutrin covered.  She has been on the extended release and has done quite well with this throughout the years however insurance will not cover.  Tried to switch her to the SR twice a day 75 mg however they did not make this in the brand-name.  With previous generic she has had failure of treatment or nausea upset stomach.  Her mood has been stable with brand-name Wellbutrin.    Started diet- the Next 56 days to help with her cholesterol and blood sugar.  We reviewed her medications.       Review Of Systems:  GEN- denies fatigue, fever, weight loss,weakness, recent illness HEENT- denies eye drainage, change in vision, nasal discharge, CVS- denies chest pain, palpitations RESP- denies SOB, cough, wheeze ABD- denies N/V, change in stools, abd pain GU- denies dysuria, hematuria, dribbling, incontinence MSK- denies joint pain, muscle aches, injury Neuro- denies headache, dizziness, syncope, seizure activity       Objective:    BP 118/68   Pulse 70   Temp 98.3 F (36.8 C) (Oral)   Resp 14   Ht 5\' 3"  (1.6 m)   Wt 203 lb (92.1 kg)   SpO2 98%   BMI 35.96 kg/m  GEN- NAD, alert and oriented x3 Psych-normal affect and mood       Assessment & Plan:      Problem List Items Addressed This Visit      Unprioritized   Depression, recurrent (HCC) - Primary    Benefit from staying on her extended release Wellbutrin 150 mg twice a day.  For some reason this is not covered after a second medical review appeal then she will take Wellbutrin brand-name 100 mg twice a day.  She is aware that this will be a slightly higher dose.      Relevant Medications   WELLBUTRIN SR 100 MG 12 hr tablet      Note: This dictation was prepared with  Dragon dictation along with smaller phrase technology. Any transcriptional errors that result from this process are unintentional.

## 2018-04-18 NOTE — Assessment & Plan Note (Signed)
Benefit from staying on her extended release Wellbutrin 150 mg twice a day.  For some reason this is not covered after a second medical review appeal then she will take Wellbutrin brand-name 100 mg twice a day.  She is aware that this will be a slightly higher dose.

## 2018-04-18 NOTE — Patient Instructions (Signed)
F/U as previous 

## 2018-04-19 MED ORDER — BUPROPION HCL ER (XL) 150 MG PO TB24: 150.0000 mg | ORAL_TABLET | Freq: Every day | ORAL | 3 refills | Status: DC

## 2018-04-19 NOTE — Telephone Encounter (Signed)
Received call from [facility].   Was advised that appeal has been approved 04/12/2018- 10/30/2018. Appeal ABB- [id number].

## 2018-04-19 NOTE — Telephone Encounter (Signed)
Call placed to pharmacy. Was advised that medication continues to reject stating PA is required.   Call placed to Prior Authorization Department at [phone]. Was advised that medication that was approved is noted as SR and not XL. New PA completed. [facility] is reviewing your PA request. Typically an electronic response will be received within 72 hours.

## 2018-04-20 NOTE — Telephone Encounter (Signed)
Received PA determination for Wellbutrin XL 150mg .   [id number] approved through 10/30/2018.  Pharmacy made aware.   Call placed to patient and patient made aware.

## 2018-04-20 NOTE — Telephone Encounter (Signed)
Received fax requesting additional information: Has patient tried and failed Bupropion XL.   Clinical information called to [facility].

## 2018-04-30 ENCOUNTER — Telehealth: Payer: Self-pay | Admitting: Pulmonary Disease

## 2018-04-30 NOTE — Telephone Encounter (Signed)
Spoke with patient. She stated that [facility] is needing a copy of her SS from 2016 to be sent to them. Advised patient that I found her SS in media and would fax it to [place]. She verbalized understanding. Nothing else needed at time of call.   Will close this encounter.

## 2018-04-30 NOTE — Telephone Encounter (Signed)
LMTCB

## 2018-05-01 NOTE — Telephone Encounter (Signed)
Called and spoke with [place] with [facility] regarding pt's cpap titration results. Carolyn [name] is needing a copy of the sleep study diagnostics results. There is no diagnostic results on the scan at this time from the cpap titration study. Carolyn [name] stated that she will call the patient with this information and let pt know. Nothing further needed at this time per [facility].

## 2018-05-01 NOTE — Telephone Encounter (Signed)
Danae Chen, [facility] returned call, [phone].

## 2018-05-01 NOTE — Telephone Encounter (Signed)
Attempted to call [facility] but no answer. Left message for them to return call.

## 2018-05-17 DIAGNOSIS — G4733 Obstructive sleep apnea (adult) (pediatric): Secondary | ICD-10-CM | POA: Diagnosis not present

## 2018-05-18 ENCOUNTER — Other Ambulatory Visit: Payer: Self-pay | Admitting: Family Medicine

## 2018-05-29 ENCOUNTER — Ambulatory Visit (INDEPENDENT_AMBULATORY_CARE_PROVIDER_SITE_OTHER): Payer: Medicare Other | Admitting: Family Medicine

## 2018-05-29 ENCOUNTER — Encounter: Payer: Self-pay | Admitting: Family Medicine

## 2018-05-29 ENCOUNTER — Other Ambulatory Visit: Payer: Self-pay

## 2018-05-29 VITALS — BP 110/62 | HR 70 | Temp 98.1°F | Resp 14 | Ht 63.0 in | Wt 200.0 lb

## 2018-05-29 DIAGNOSIS — E782 Mixed hyperlipidemia: Secondary | ICD-10-CM

## 2018-05-29 DIAGNOSIS — J0100 Acute maxillary sinusitis, unspecified: Secondary | ICD-10-CM

## 2018-05-29 MED ORDER — FLUCONAZOLE 150 MG PO TABS: 150.0000 mg | ORAL_TABLET | Freq: Once | ORAL | 0 refills | Status: AC

## 2018-05-29 MED ORDER — AMOXICILLIN 875 MG PO TABS: 875.0000 mg | ORAL_TABLET | Freq: Two times a day (BID) | ORAL | 0 refills | Status: DC

## 2018-05-29 NOTE — Patient Instructions (Addendum)
Take antibiotics Use nasal spray F/U as prevous  Schedule LAB VISIT FOR CHOLESTEROL IN NEXT 1-2 WEEKS

## 2018-05-29 NOTE — Progress Notes (Signed)
   Subjective:    Patient ID: Carolyn [name], female    DOB: 1951-04-13, 67 y.o.   MRN: [id number]  Patient presents for Illness (x weeks- sinus pressure, sore throat, HA, nasal congestion)   Sinus pain and pressure for past few weeks, sore throat and earache. Has tried OTC meds- allergy med, nasal sprays. No fever.Has mild cough from drainage.   No vomiting    Needs recheck on TG, were  501, Vascepa added to her [place]   at last check Review Of Systems:  GEN- denies fatigue, fever, weight loss,weakness, recent illness HEENT- denies eye drainage, change in vision,+ nasal discharge, CVS- denies chest pain, palpitations RESP- denies SOB, cough, wheeze ABD- denies N/V, change in stools, abd pain GU- denies dysuria, hematuria, dribbling, incontinence MSK- denies joint pain, muscle aches, injury Neuro- denies headache, dizziness, syncope, seizure activity       Objective:    BP 110/62   Pulse 70   Temp 98.1 F (36.7 C) (Oral)   Resp 14   Ht 5\' 3"  (1.6 m)   Wt 200 lb (90.7 kg)   SpO2 96%   BMI 35.43 kg/m  GEN- NAD, alert and oriented x3 HEENT- PERRL, EOMI, non injected sclera, pink conjunctiva, MMM, oropharynx mild injection, TM clear bilat no effusion,  + maxillary/frontal  sinus tenderness, inflammed turbinates,  Nasal drainage  Neck- Supple, + shotty cervical LAD CVS- RRR, no murmur RESP-CTAB EXT- No edema Pulses- Radial 2+          Assessment & Plan:      Problem List Items Addressed This Visit      Unprioritized   Hyperlipidemia   Relevant Orders   Lipid panel    Other Visit Diagnoses    Acute maxillary sinusitis, recurrence not specified    -  Primary   Treat with amoxicillin, nasal spray, diflucan due to yeast infection   Relevant Medications   amoxicillin (AMOXIL) 875 MG tablet   fluconazole (DIFLUCAN) 150 MG tablet      Note: This dictation was prepared with Dragon dictation along with smaller phrase technology. Any transcriptional errors that  result from this process are unintentional.

## 2018-06-01 ENCOUNTER — Other Ambulatory Visit: Payer: Medicare Other

## 2018-06-01 DIAGNOSIS — E782 Mixed hyperlipidemia: Secondary | ICD-10-CM | POA: Diagnosis not present

## 2018-06-01 LAB — LIPID PANEL
CHOL/HDL RATIO: 3.5 (calc) (ref <5.0)
CHOLESTEROL: 141 mg/dL (ref <200)
HDL: 40 mg/dL — ABNORMAL LOW (ref >50)
LDL CHOLESTEROL (CALC): 68 mg/dL
Non-HDL Cholesterol (Calc): 101 mg/dL (calc) (ref <130)
Triglycerides: 238 mg/dL — ABNORMAL HIGH (ref <150)

## 2018-06-04 ENCOUNTER — Encounter: Payer: Self-pay | Admitting: *Deleted

## 2018-06-13 ENCOUNTER — Other Ambulatory Visit: Payer: Self-pay | Admitting: Family Medicine

## 2018-06-15 ENCOUNTER — Other Ambulatory Visit: Payer: Self-pay | Admitting: Family Medicine

## 2018-06-28 ENCOUNTER — Other Ambulatory Visit: Payer: Self-pay | Admitting: Family Medicine

## 2018-07-15 ENCOUNTER — Other Ambulatory Visit: Payer: Self-pay | Admitting: Family Medicine

## 2018-08-14 ENCOUNTER — Other Ambulatory Visit: Payer: Self-pay | Admitting: Family Medicine

## 2018-09-13 ENCOUNTER — Other Ambulatory Visit: Payer: Self-pay | Admitting: Family Medicine

## 2018-10-06 ENCOUNTER — Other Ambulatory Visit: Payer: Self-pay | Admitting: Family Medicine

## 2018-10-16 ENCOUNTER — Other Ambulatory Visit: Payer: Self-pay | Admitting: *Deleted

## 2018-10-16 ENCOUNTER — Ambulatory Visit: Payer: Medicare Other | Admitting: Family Medicine

## 2018-10-16 DIAGNOSIS — Z0000 Encounter for general adult medical examination without abnormal findings: Secondary | ICD-10-CM

## 2018-10-16 DIAGNOSIS — Z780 Asymptomatic menopausal state: Secondary | ICD-10-CM

## 2018-10-16 DIAGNOSIS — Z13820 Encounter for screening for osteoporosis: Secondary | ICD-10-CM

## 2018-10-17 ENCOUNTER — Ambulatory Visit: Payer: Medicare Other | Admitting: Family Medicine

## 2018-10-18 ENCOUNTER — Telehealth: Payer: Self-pay | Admitting: *Deleted

## 2018-10-18 NOTE — Telephone Encounter (Signed)
Received request from pharmacy for PA on name brand Wellbutrin XL.   PA submitted.   Dx: F33.09- recurrent depression  Carolyn [name] has been on brand-name Wellbutrin Xl for some time, and has done quite well with this throughout the years. She has tried to switch to the SR twice a day 75 mg, however they did not make this in the brand-name.  With previous generic she has had failure of treatment, and nausea/ upset stomach.  Her mood has been stable with brand-name Wellbutrin.

## 2018-10-18 NOTE — Telephone Encounter (Signed)
[facility] is reviewing your PA request. Typically an electronic response will be received within 72 hours. To check for an update later, open this request from your dashboard.    You may close this dialog and return to your dashboard to perform other tasks.

## 2018-10-19 NOTE — Telephone Encounter (Signed)
Received PA determination.   PA denied. Per denial, patient must try and fail, or have specific reason as to why she cannot use formulary alternatives: Duloxetine Mirtazapine  MD please advise.   [facility] Appeals 1- [phone]~ telephone [phone]~ fax

## 2018-10-19 NOTE — Telephone Encounter (Signed)
Pt has been stable on wellbutrin brand, change in medication will cause deterioration to mental health Remeron has potential to cause weight gain

## 2018-10-20 ENCOUNTER — Other Ambulatory Visit: Payer: Self-pay | Admitting: Family Medicine

## 2018-10-25 ENCOUNTER — Encounter: Payer: Self-pay | Admitting: *Deleted

## 2018-10-25 NOTE — Telephone Encounter (Signed)
Appeal faxed

## 2018-10-26 ENCOUNTER — Other Ambulatory Visit: Payer: Self-pay | Admitting: *Deleted

## 2018-10-26 MED ORDER — BUPROPION HCL ER (XL) 150 MG PO TB24: 150.0000 mg | ORAL_TABLET | Freq: Every day | ORAL | 3 refills | Status: DC

## 2018-10-26 NOTE — Telephone Encounter (Signed)
Received call from [facility].   Was advised that Appeal has been approved through 10/31/2019.

## 2018-11-04 ENCOUNTER — Other Ambulatory Visit: Payer: Self-pay | Admitting: Family Medicine

## 2018-11-07 DIAGNOSIS — H52203 Unspecified astigmatism, bilateral: Secondary | ICD-10-CM | POA: Diagnosis not present

## 2018-11-07 DIAGNOSIS — H5213 Myopia, bilateral: Secondary | ICD-10-CM | POA: Diagnosis not present

## 2018-11-07 DIAGNOSIS — E119 Type 2 diabetes mellitus without complications: Secondary | ICD-10-CM | POA: Diagnosis not present

## 2018-11-07 DIAGNOSIS — H524 Presbyopia: Secondary | ICD-10-CM | POA: Diagnosis not present

## 2018-11-07 DIAGNOSIS — H2513 Age-related nuclear cataract, bilateral: Secondary | ICD-10-CM | POA: Diagnosis not present

## 2018-11-07 LAB — HM DIABETES EYE EXAM

## 2018-11-09 ENCOUNTER — Encounter: Payer: Self-pay | Admitting: *Deleted

## 2018-11-09 ENCOUNTER — Ambulatory Visit (HOSPITAL_COMMUNITY)
Admission: RE | Admit: 2018-11-09 | Discharge: 2018-11-09 | Disposition: A | Discharge status: Home / Self Care | Payer: Medicare Other | Source: Ambulatory Visit | Attending: Family Medicine | Admitting: Family Medicine

## 2018-11-09 DIAGNOSIS — Z0000 Encounter for general adult medical examination without abnormal findings: Secondary | ICD-10-CM | POA: Diagnosis not present

## 2018-11-09 DIAGNOSIS — Z780 Asymptomatic menopausal state: Secondary | ICD-10-CM | POA: Diagnosis present

## 2018-11-09 DIAGNOSIS — Z13820 Encounter for screening for osteoporosis: Secondary | ICD-10-CM | POA: Diagnosis not present

## 2018-11-15 ENCOUNTER — Encounter: Payer: Self-pay | Admitting: *Deleted

## 2018-11-19 ENCOUNTER — Other Ambulatory Visit: Payer: Self-pay | Admitting: Family Medicine

## 2018-11-21 ENCOUNTER — Ambulatory Visit (INDEPENDENT_AMBULATORY_CARE_PROVIDER_SITE_OTHER): Payer: Medicare Other | Admitting: Family Medicine

## 2018-11-21 ENCOUNTER — Encounter: Payer: Self-pay | Admitting: Family Medicine

## 2018-11-21 VITALS — BP 152/94 | HR 73 | Temp 98.3°F | Resp 18 | Ht 63.0 in | Wt 195.0 lb

## 2018-11-21 DIAGNOSIS — R002 Palpitations: Secondary | ICD-10-CM

## 2018-11-21 DIAGNOSIS — I10 Essential (primary) hypertension: Secondary | ICD-10-CM

## 2018-11-21 DIAGNOSIS — I5189 Other ill-defined heart diseases: Secondary | ICD-10-CM | POA: Diagnosis not present

## 2018-11-21 DIAGNOSIS — F339 Major depressive disorder, recurrent, unspecified: Secondary | ICD-10-CM

## 2018-11-21 MED ORDER — WELLBUTRIN XL 150 MG PO TB24: 150.0000 mg | ORAL_TABLET | Freq: Every day | ORAL | 3 refills | Status: DC

## 2018-11-21 NOTE — Patient Instructions (Addendum)
F/U as previous  Referral to cardiology

## 2018-11-21 NOTE — Assessment & Plan Note (Signed)
Called pharmacy to correct problem and get override to restart her brand name Wellbutrin

## 2018-11-21 NOTE — Progress Notes (Signed)
   Subjective:    Patient ID: Carolyn [name], female    DOB: 10-14-1951, 68 y.o.   MRN: [id number]  Patient presents for Medication Management (Wants name brand Wellbutrin - Husband picked up generic and she can not get name brand at this time cause that was a 90 day supply)   Pt here to discuss her medications. Not doing well on the generic of wellbutrin, more depressed, oversleeping, but still very fatigued, not anxious appearing, does not feel herself  There has been a mixup at the pharmacy if she is supposed to be on name brand Wellbutrin which is helped control her symptoms.  Admits that she has had intermittent episodes of palpitations over the past few months.  She had this when she lived up [place] which is where and she was eventually put on her metoprolol.  She also has diastolic dysfunction.  She would like to reestablish with a cardiologist.  We did discuss going to a higher dose of metoprolol however she said this caused significant fatigue in the past and she would rather see the cardiologist first before changing her medications.  She denies any current chest pain or shortness of breath.     Review Of Systems:  GEN- denies fatigue, fever, weight loss,weakness, recent illness HEENT- denies eye drainage, change in vision, nasal discharge, CVS- denies chest pain,+ palpitations RESP- denies SOB, cough, wheeze ABD- denies N/V, change in stools, abd pain GU- denies dysuria, hematuria, dribbling, incontinence MSK- denies joint pain, muscle aches, injury Neuro- denies headache, dizziness, syncope, seizure activity       Objective:    BP (!) 152/94   Pulse 73   Temp 98.3 F (36.8 C) (Oral)   Resp 18   Ht 5\' 3"  (1.6 m)   Wt 195 lb (88.5 kg)   SpO2 97%   BMI 34.54 kg/m  GEN- NAD, alert and oriented x3 HEENT- PERRL, EOMI, non injected sclera, pink conjunctiva, MMM, oropharynx clear Neck- Supple, no thyromegaly CVS- RRR, no murmur RESP-CTAB Psych- depressed affect, fatigued  appearing,not anxious, no SI, well groomed, good eye contact EXT- No edema Pulses- Radial 2+        Assessment & Plan:      Problem List Items Addressed This Visit      Unprioritized   Depression, recurrent ([place])    Called pharmacy to correct problem and get override to restart her brand name Wellbutrin      Relevant Medications   WELLBUTRIN XL 150 MG 24 hr tablet   Diastolic dysfunction    Referral to cardiology, declines change in medication      Relevant Orders   Ambulatory referral to Cardiology   Hypertension - Primary    Typically controlled but upset right now due to difficulty with medications No change, recheck at CPE this month      Relevant Orders   Ambulatory referral to Cardiology    Other Visit Diagnoses    Palpitations       Relevant Orders   Ambulatory referral to Cardiology      Note: This dictation was prepared with Dragon dictation along with smaller phrase technology. Any transcriptional errors that result from this process are unintentional.

## 2018-11-21 NOTE — Assessment & Plan Note (Signed)
Typically controlled but upset right now due to difficulty with medications No change, recheck at CPE this month

## 2018-11-21 NOTE — Assessment & Plan Note (Signed)
Referral to cardiology, declines change in medication

## 2018-11-22 ENCOUNTER — Telehealth: Payer: Self-pay | Admitting: *Deleted

## 2018-11-22 NOTE — Telephone Encounter (Signed)
Patient seen in office.   States that patient spouse went to pick up medications and accidentally picked up 90 day supply of generic Wellbutrin. Reports that she has been taking medication, but is having increased SE from generic medication.   Call placed to pharmacy and spoke with [facility]. Medication is being rejected. Call placed to [facility] and spoke with [facility]- 1- [phone]~ telephone. Reports that override is required and pharmacy needs to contact help desk.   Call placed to pharmacy and advised Nicki Reaper that pharmacy help desk number is [phone].   Scott contacted help desk and override obtained.   Noted that prescription remians $100/ month or $500/90 days.   Contacted [facility] and Tier Exception started.

## 2018-11-23 DIAGNOSIS — G4733 Obstructive sleep apnea (adult) (pediatric): Secondary | ICD-10-CM | POA: Diagnosis not present

## 2018-11-26 NOTE — Telephone Encounter (Signed)
Received Tier Exception determination.   Exception denied as medications not on formulary list approved for coverage do not qualify for cost lowering amount.

## 2018-11-28 ENCOUNTER — Ambulatory Visit (INDEPENDENT_AMBULATORY_CARE_PROVIDER_SITE_OTHER): Payer: Medicare Other | Admitting: Family Medicine

## 2018-11-28 ENCOUNTER — Other Ambulatory Visit: Payer: Self-pay

## 2018-11-28 ENCOUNTER — Encounter: Payer: Self-pay | Admitting: Family Medicine

## 2018-11-28 VITALS — BP 130/64 | HR 70 | Temp 98.1°F | Resp 16 | Ht 63.0 in | Wt 194.0 lb

## 2018-11-28 DIAGNOSIS — I10 Essential (primary) hypertension: Secondary | ICD-10-CM

## 2018-11-28 DIAGNOSIS — IMO0001 Reserved for inherently not codable concepts without codable children: Secondary | ICD-10-CM

## 2018-11-28 DIAGNOSIS — F339 Major depressive disorder, recurrent, unspecified: Secondary | ICD-10-CM

## 2018-11-28 DIAGNOSIS — Z1159 Encounter for screening for other viral diseases: Secondary | ICD-10-CM

## 2018-11-28 DIAGNOSIS — E782 Mixed hyperlipidemia: Secondary | ICD-10-CM

## 2018-11-28 DIAGNOSIS — E119 Type 2 diabetes mellitus without complications: Secondary | ICD-10-CM | POA: Diagnosis not present

## 2018-11-28 DIAGNOSIS — Z789 Other specified health status: Secondary | ICD-10-CM

## 2018-11-28 DIAGNOSIS — Z0000 Encounter for general adult medical examination without abnormal findings: Secondary | ICD-10-CM | POA: Diagnosis not present

## 2018-11-28 MED ORDER — PITAVASTATIN CALCIUM 2 MG PO TABS: 1.0000 | ORAL_TABLET | Freq: Every day | ORAL | 2 refills | Status: DC

## 2018-11-28 NOTE — Patient Instructions (Addendum)
Schedule theramogram We will call with lab resuts  F/U 4 months

## 2018-11-28 NOTE — Progress Notes (Signed)
Subjective:   Patient presents for Medicare Annual/Subsequent preventive examination.   Pt here for wellness exam  Medications reviewed  Urinary frequency, wears  Liner for the leaking   She does have some constipaton- uses magnesium and psyllium, if more than 2 days then takes extra magnesium     Review Past Medical/Family/Social: Per EMR    Risk Factors  Current exercise habits:  Walks/exericse a few days a week  Dietary issues discussed: Yes  Cardiac risk factors: Obesity (BMI >= 30 kg/m2). HTN, hYPERLIPIDEMIA  Depression Screen  (Note: if answer to either of the following is "Yes", a more complete depression screening is indicated)  Over the past two weeks, have you felt down, depressed or hopeless? No Over the past two weeks, have you felt little interest or pleasure in doing things? No Have you lost interest or pleasure in daily life? No Do you often feel hopeless? No Do you cry easily over simple problems? No   Activities of Daily Living  In your present state of health, do you have any difficulty performing the following activities?:  Driving? No  Managing money? No  Feeding yourself? No  Getting from bed to chair? No  Climbing a flight of stairs? No  Preparing food and eating?: No  Bathing or showering? No  Getting dressed: No  Getting to the toilet? No  Using the toilet:No  Moving around from place to place: No  In the past year have you fallen or had a near fall?:No  Are you sexually active? No  Do you have more than one partner? No   Hearing Difficulties: No  Do you often ask people to speak up or repeat themselves? No  Do you experience ringing or noises in your ears? No Do you have difficulty understanding soft or whispered voices? No  Do you feel that you have a problem with memory? No Do you often misplace items? No  Do you feel safe at home? Yes  Cognitive Testing  Alert? Yes Normal Appearance?Yes  Oriented to person? Yes Place? Yes  Time? Yes   Recall of three objects? Yes  Can perform simple calculations? Yes  Displays appropriate judgment?Yes  Can read the correct time from a watch face?Yes   List the Names of Other Physician/Practitioners you currently use:   Opthalmology Dr. [name]  Screening Tests / Date Colonoscopy           UTD          Zostavax Declines Mammogram - wants thermogram Influenza Vaccine Declines  Tetanus/tdap Declines Pneumonia- declines   ROS: GEN- denies fatigue, fever, weight loss,weakness, recent illness HEENT- denies eye drainage, change in vision, nasal discharge, CVS- denies chest pain, palpitations RESP- denies SOB, cough, wheeze ABD- denies N/V, change in stools, abd pain GU- denies dysuria, hematuria, dribbling, incontinence MSK- denies joint pain, muscle aches, injury Neuro- denies headache, dizziness, syncope, seizure activity  GEN- NAD, alert and oriented x3 HEENT- PERRL, EOMI, non injected sclera, pink conjunctiva, MMM, oropharynx clear Neck- Supple, no thryomegaly,no bruit CVS- RRR, no murmur RESP-CTAB ABD-NABS,soft,NT,ND EXT- No edema Pulses- Radial, DP- 2+    Assessment:    Annual wellness medicare exam   Plan:    During the course of the visit the patient was educated and counseled about appropriate screening and preventive services including:   CPE done- declines immunizations, does not want to take a lot of medications   HTN- controlled  Hyperlipidemia- check fasting labs, recommend she take both omega 3 and  the livalo  MDD- much improved now she is back on her wellbutrin   Audit C/Fall screen negative  Bone density UTD Mammogram- pt wants thermogram study which she is going to get in [place] in a few months.  DM- diet controlled, recheck A1C   No blood products - Religous/ has living will  Diet review for nutrition referral? Yes ____ Not Indicated __x__  Patient Instructions (the written plan) was given to the patient.  Medicare Attestation   I have personally reviewed:  The patient's medical and social history  Their use of alcohol, tobacco or illicit drugs  Their current medications and supplements  The patient's functional ability including ADLs,fall risks, home safety risks, cognitive, and hearing and visual impairment  Diet and physical activities  Evidence for depression or mood disorders  The patient's weight, height, BMI, and visual acuity have been recorded in the chart. I have made referrals, counseling, and provided education to the patient based on review of the above and I have provided the patient with a written personalized care plan for preventive services.

## 2018-11-29 LAB — LIPID PANEL
CHOLESTEROL: 208 mg/dL — AB (ref <200)
HDL: 42 mg/dL — ABNORMAL LOW (ref >50)
LDL Cholesterol (Calc): 130 mg/dL (calc) — ABNORMAL HIGH
Non-HDL Cholesterol (Calc): 166 mg/dL (calc) — ABNORMAL HIGH (ref <130)
Total CHOL/HDL Ratio: 5 (calc) — ABNORMAL HIGH (ref <5.0)
Triglycerides: 216 mg/dL — ABNORMAL HIGH (ref <150)

## 2018-11-29 LAB — CBC WITH DIFFERENTIAL/PLATELET
Absolute Monocytes: 455 cells/uL (ref 200–950)
BASOS PCT: 0.8 %
Basophils Absolute: 40 cells/uL (ref 0–200)
EOS PCT: 3 %
Eosinophils Absolute: 150 cells/uL (ref 15–500)
HCT: 43.7 % (ref 35.0–45.0)
Hemoglobin: 15.2 g/dL (ref 11.7–15.5)
Lymphs Abs: 1670 cells/uL (ref 850–3900)
MCH: 32.2 pg (ref 27.0–33.0)
MCHC: 34.8 g/dL (ref 32.0–36.0)
MCV: 92.6 fL (ref 80.0–100.0)
MPV: 9.5 fL (ref 7.5–12.5)
Monocytes Relative: 9.1 %
Neutro Abs: 2685 cells/uL (ref 1500–7800)
Neutrophils Relative %: 53.7 %
Platelets: 262 10*3/uL (ref 140–400)
RBC: 4.72 10*6/uL (ref 3.80–5.10)
RDW: 12.2 % (ref 11.0–15.0)
TOTAL LYMPHOCYTE: 33.4 %
WBC: 5 10*3/uL (ref 3.8–10.8)

## 2018-11-29 LAB — HEMOGLOBIN A1C
HEMOGLOBIN A1C: 6.1 %{Hb} — AB (ref <5.7)
MEAN PLASMA GLUCOSE: 128 (calc)
eAG (mmol/L): 7.1 (calc)

## 2018-11-29 LAB — COMPREHENSIVE METABOLIC PANEL
AG Ratio: 2.2 (calc) (ref 1.0–2.5)
ALT: 20 U/L (ref 6–29)
AST: 19 U/L (ref 10–35)
Albumin: 4.9 g/dL (ref 3.6–5.1)
Alkaline phosphatase (APISO): 68 U/L (ref 33–130)
BUN: 11 mg/dL (ref 7–25)
CO2: 24 mmol/L (ref 20–32)
Calcium: 10 mg/dL (ref 8.6–10.4)
Chloride: 106 mmol/L (ref 98–110)
Creat: 0.78 mg/dL (ref 0.50–0.99)
Globulin: 2.2 g/dL (calc) (ref 1.9–3.7)
Glucose, Bld: 121 mg/dL — ABNORMAL HIGH (ref 65–99)
Potassium: 4.2 mmol/L (ref 3.5–5.3)
SODIUM: 142 mmol/L (ref 135–146)
TOTAL PROTEIN: 7.1 g/dL (ref 6.1–8.1)
Total Bilirubin: 0.9 mg/dL (ref 0.2–1.2)

## 2018-11-29 LAB — HEPATITIS C ANTIBODY
Hepatitis C Ab: NONREACTIVE
SIGNAL TO CUT-OFF: 0.01 (ref <1.00)

## 2018-12-06 ENCOUNTER — Encounter: Payer: Self-pay | Admitting: Family Medicine

## 2018-12-25 ENCOUNTER — Ambulatory Visit: Payer: Medicare Other | Admitting: Cardiovascular Disease

## 2019-01-01 ENCOUNTER — Other Ambulatory Visit: Payer: Self-pay | Admitting: Family Medicine

## 2019-01-01 ENCOUNTER — Encounter: Payer: Self-pay | Admitting: Cardiovascular Disease

## 2019-01-01 ENCOUNTER — Ambulatory Visit: Payer: Medicare Other | Admitting: Cardiovascular Disease

## 2019-01-01 VITALS — BP 138/76 | HR 78 | Ht 62.5 in | Wt 194.0 lb

## 2019-01-01 DIAGNOSIS — E7800 Pure hypercholesterolemia, unspecified: Secondary | ICD-10-CM | POA: Diagnosis not present

## 2019-01-01 DIAGNOSIS — Z01818 Encounter for other preprocedural examination: Secondary | ICD-10-CM | POA: Diagnosis not present

## 2019-01-01 DIAGNOSIS — R0789 Other chest pain: Secondary | ICD-10-CM

## 2019-01-01 DIAGNOSIS — I10 Essential (primary) hypertension: Secondary | ICD-10-CM

## 2019-01-01 DIAGNOSIS — I5189 Other ill-defined heart diseases: Secondary | ICD-10-CM

## 2019-01-01 DIAGNOSIS — R002 Palpitations: Secondary | ICD-10-CM

## 2019-01-01 MED ORDER — METOPROLOL TARTRATE 100 MG PO TABS: ORAL_TABLET | ORAL | 0 refills | Status: DC

## 2019-01-01 NOTE — Progress Notes (Signed)
CARDIOLOGY CONSULT NOTE  Patient ID: Carolyn [name] MRN: [id number] DOB/AGE: Jan 23, 1951 68 y.o.  Admit date: (Not on file) Primary Physician: Alycia Rossetti, MD Referring Physician: Alycia Rossetti, MD  Reason for Consultation: Palpitations and chest tightness  HPI: Carolyn [name] is a 68 y.o. female who is being seen today for the evaluation of palpitations and chest tightness at the request of Alycia Rossetti, MD.   CBC and comprehensive metabolic panel were normal on 11/28/2018.  A1c 6.1%.  Lipid panel reflective of hyperlipidemia with total cholesterol 208, HDL 42, triglycerides 216, LDL 130.  I reviewed an echocardiogram report dated 08/14/2015 performed in [place] which demonstrated normal left ventricular systolic function, EF 55 to 70%, grade 1 diastolic dysfunction, and mild left atrial enlargement.  ECG performed today which I ordered and personally interpreted demonstrated sinus rhythm with late R wave transition.  She and her husband moved to [place] from [place] 2 years ago as they are [employer] and wanted to do volunteer work.  She is retired from Engineer, mining.    She had been experiencing episodic palpitations and chest discomfort there and was seen by cardiologist who started Toprol-XL.  She is undergone stress test in the past.  Toprol-XL was increased but she became fatigued so the dose was reduced back to 25 mg daily and losartan was added for blood pressure control.  He also started Vascepa.  She has noticed increasing retrosternal chest discomfort over the past 6 months.  Episodes may last minutes and can occur both at rest and with exertion.  Symptoms are alleviated with rest.  She also has GERD and age-related esophageal dysmotility.  She said her symptoms of chest tightness are different from these.  Family history: Mother had angina and CHF and died at the age of 13.  Father died of an MI at age 77.  He was a smoker.    Allergies    Allergen Reactions  . Prozac [Fluoxetine Hcl] Anaphylaxis  . Codeine Other (See Comments)    Intolerance   . Other     -mycin medications: has myasthenia gravis   . Sulfa Antibiotics Hives    Current Outpatient Medications  Medication Sig Dispense Refill  . BIOTIN PO Take 2,500 mcg by mouth 2 (two) times daily.    . Blood Glucose Monitoring Suppl (BLOOD GLUCOSE SYSTEM PAK) KIT Use as directed to monitor FSBS 2x daily. Dx: E11.9 1 each 1  . Cholecalciferol (VITAMIN D3) 1000 units CAPS Take by mouth daily.    Marland Kitchen losartan (COZAAR) 25 MG tablet TAKE 1 TABLET(25 MG) BY MOUTH DAILY 90 tablet 0  . metoprolol succinate (TOPROL-XL) 25 MG 24 hr tablet TAKE 1 TABLET(25 MG) BY MOUTH DAILY 90 tablet 3  . Multiple Vitamins-Minerals (MULTI COMPLETE PO) Take by mouth daily.    . NON FORMULARY Take by mouth daily.    . NON FORMULARY Take by mouth daily.    . NON FORMULARY Take by mouth.    . NON FORMULARY Take by mouth daily.    Glory Rosebush VERIO test strip USE TO TEST BLOOD SUGAR TWICE DAILY 100 each 11  . Pitavastatin Calcium (LIVALO) 2 MG TABS Take 1 tablet (2 mg total) by mouth daily. 90 tablet 2  . VASCEPA 1 g CAPS TAKE 2 CAPSULES(2 GRAMS) BY MOUTH TWICE DAILY WITH A MEAL 120 capsule 3  . WELLBUTRIN XL 150 MG 24 hr tablet Take 1 tablet (150 mg total) by  mouth daily. 90 tablet 3   No current facility-administered medications for this visit.     Past Medical History:  Diagnosis Date  . Depression   . Diabetes mellitus without complication ([place])   . Diastolic dysfunction   . Hyperlipidemia   . Myasthenia gravis ([place])   . Patient is Jehovah's Witness   . Sleep apnea     Past Surgical History:  Procedure Laterality Date  . APPENDECTOMY    . BUNIONECTOMY    . MANDIBLE SURGERY    . TONSILECTOMY, ADENOIDECTOMY, BILATERAL MYRINGOTOMY AND TUBES      Social History   Socioeconomic History  . Marital status: Married    Spouse name: Legrand Como  . Number of children: 4  . Years of  education: some college  . Highest education level: Some college, no degree  Occupational History  . Occupation: Retired  Scientific laboratory technician  . Financial resource strain: Not hard at all  . Food insecurity:    Worry: Never true    Inability: Never true  . Transportation needs:    Medical: No    Non-medical: No  Tobacco Use  . Smoking status: Never Smoker  . Smokeless tobacco: Never Used  Substance and Sexual Activity  . Alcohol use: Yes    Frequency: Never    Comment: socially  . Drug use: No  . Sexual activity: Yes    Birth control/protection: Other-see comments    Comment: postmenopausal  Lifestyle  . Physical activity:    Days per week: 0 days    Minutes per session: 0 min  . Stress: Not at all  Relationships  . Social connections:    Talks on phone: Twice a week    Gets together: Twice a week    Attends religious service: More than 4 times per year    Active member of club or organization: No    Attends meetings of clubs or organizations: Never    Relationship status: Married  . Intimate partner violence:    Fear of current or ex partner: No    Emotionally abused: No    Physically abused: No    Forced sexual activity: No  Other Topics Concern  . Not on file  Social History Narrative  . Not on file      Current Meds  Medication Sig  . BIOTIN PO Take 2,500 mcg by mouth 2 (two) times daily.  . Blood Glucose Monitoring Suppl (BLOOD GLUCOSE SYSTEM PAK) KIT Use as directed to monitor FSBS 2x daily. Dx: E11.9  . Cholecalciferol (VITAMIN D3) 1000 units CAPS Take by mouth daily.  Marland Kitchen losartan (COZAAR) 25 MG tablet TAKE 1 TABLET(25 MG) BY MOUTH DAILY  . metoprolol succinate (TOPROL-XL) 25 MG 24 hr tablet TAKE 1 TABLET(25 MG) BY MOUTH DAILY  . Multiple Vitamins-Minerals (MULTI COMPLETE PO) Take by mouth daily.  . NON FORMULARY Take by mouth daily.  . NON FORMULARY Take by mouth daily.  . NON FORMULARY Take by mouth.  . NON FORMULARY Take by mouth daily.  Glory Rosebush VERIO  test strip USE TO TEST BLOOD SUGAR TWICE DAILY  . Pitavastatin Calcium (LIVALO) 2 MG TABS Take 1 tablet (2 mg total) by mouth daily.  Marland Kitchen VASCEPA 1 g CAPS TAKE 2 CAPSULES(2 GRAMS) BY MOUTH TWICE DAILY WITH A MEAL  . WELLBUTRIN XL 150 MG 24 hr tablet Take 1 tablet (150 mg total) by mouth daily.      Review of systems complete and found to be negative unless listed  above in HPI    Physical exam Blood pressure 140/82, pulse 78, height 5' 2.5" (1.588 m), weight 194 lb (88 kg), SpO2 98 %. General: NAD Neck: No JVD, no thyromegaly or thyroid nodule.  Lungs: Clear to auscultation bilaterally with normal respiratory effort. CV: Nondisplaced PMI. Regular rate and rhythm, normal S1/S2, no S3/S4, no murmur.  No peripheral edema.  No carotid bruit.    Abdomen: Soft, nontender, no distention.  Skin: Intact without lesions or rashes.  Neurologic: Alert and oriented x 3.  Psych: Normal affect. Extremities: No clubbing or cyanosis.  HEENT: Normal.   ECG: Most recent ECG reviewed.   Labs: Lab Results  Component Value Date/Time   K 4.2 11/28/2018 10:18 AM   BUN 11 11/28/2018 10:18 AM   CREATININE 0.78 11/28/2018 10:18 AM   ALT 20 11/28/2018 10:18 AM   TSH 3.63 02/07/2018 08:07 AM   HGB 15.2 11/28/2018 10:18 AM     Lipids: Lab Results  Component Value Date/Time   LDLCALC 130 (H) 11/28/2018 10:18 AM   CHOL 208 (H) 11/28/2018 10:18 AM   TRIG 216 (H) 11/28/2018 10:18 AM   HDL 42 (L) 11/28/2018 10:18 AM        ASSESSMENT AND PLAN:  1.  Palpitations: Currently on Toprol-XL 25 mg daily.  She seldom has palpitations.  2.  Hypercholesterolemia: Currently on Livalo 2 mg daily and Vascepa.  Lipid panel reviewed above.  3.  Hypertension: Blood pressure is borderline elevated.  No changes to therapy today.  4.  Chest discomfort/tightness: She meets criteria for typical angina.  I will obtain coronary CT angiography to evaluate for hemodynamically significant coronary artery disease and an  echocardiogram to evaluate cardiac structure and function.   Disposition: Follow up in 3 months  Signed: Kate Sable, M.D., F.A.C.C.  01/01/2019, 8:45 AM

## 2019-01-01 NOTE — Patient Instructions (Signed)
Medication Instructions: Your physician recommends that you continue on your current medications as directed. Please refer to the Current Medication list given to you today.   Labwork: BMET  Within 1 month of your Cardiac CT  Procedures/Testing: Please schedule cardiac ct   Your physician recommends that you continue on your current medications as directed. Please refer to the Current Medication list given to you today.   Follow-Up: 3 months with Dr.Koneswaran  Any Additional Special Instructions Will Be Listed Below (If Applicable).     If you need a refill on your cardiac medications before your next appointment, please call your pharmacy.

## 2019-01-07 ENCOUNTER — Other Ambulatory Visit: Payer: Self-pay

## 2019-01-07 ENCOUNTER — Telehealth: Payer: Self-pay | Admitting: *Deleted

## 2019-01-07 ENCOUNTER — Ambulatory Visit (HOSPITAL_COMMUNITY)
Admission: RE | Admit: 2019-01-07 | Discharge: 2019-01-07 | Disposition: A | Discharge status: Home / Self Care | Payer: Medicare Other | Source: Ambulatory Visit | Attending: Cardiovascular Disease | Admitting: Cardiovascular Disease

## 2019-01-07 DIAGNOSIS — R0789 Other chest pain: Secondary | ICD-10-CM

## 2019-01-07 NOTE — Telephone Encounter (Signed)
Called patient with test results. No answer. Left message to call back.  

## 2019-01-07 NOTE — Telephone Encounter (Signed)
-----   Message from Herminio Commons, MD sent at 01/07/2019 11:40 AM EDT ----- Overall pumping function is normal.

## 2019-01-07 NOTE — Progress Notes (Signed)
*  PRELIMINARY RESULTS* Echocardiogram 2D Echocardiogram has been performed.  Leavy Cella 01/07/2019, 9:38 AM

## 2019-01-23 ENCOUNTER — Other Ambulatory Visit: Payer: Self-pay

## 2019-01-23 ENCOUNTER — Encounter: Payer: Self-pay | Admitting: Family Medicine

## 2019-01-23 ENCOUNTER — Ambulatory Visit (INDEPENDENT_AMBULATORY_CARE_PROVIDER_SITE_OTHER): Payer: Medicare Other | Admitting: Family Medicine

## 2019-01-23 VITALS — BP 126/64 | HR 78 | Temp 98.4°F | Resp 14 | Ht 62.5 in | Wt 196.0 lb

## 2019-01-23 DIAGNOSIS — B029 Zoster without complications: Secondary | ICD-10-CM

## 2019-01-23 MED ORDER — VALACYCLOVIR HCL 1 G PO TABS: 1000.0000 mg | ORAL_TABLET | Freq: Three times a day (TID) | ORAL | 0 refills | Status: DC

## 2019-01-23 MED ORDER — TRAMADOL HCL 50 MG PO TABS: 50.0000 mg | ORAL_TABLET | Freq: Three times a day (TID) | ORAL | 0 refills | Status: DC | PRN

## 2019-01-23 NOTE — Patient Instructions (Addendum)
F/U as needed  Shingles  Shingles is an infection. It gives you a painful skin rash and blisters that have fluid in them. Shingles is caused by the same germ (virus) that causes chickenpox. Shingles only happens in people who:  Have had chickenpox.  Have been given a shot of medicine (vaccine) to protect against chickenpox. Shingles is rare in this group. The first symptoms of shingles may be itching, tingling, or pain in an area on your skin. A rash will show on your skin a few days or weeks later. The rash is likely to be on one side of your body. The rash usually has a shape like a belt or a band. Over time, the rash turns into fluid-filled blisters. The blisters will break open, change into scabs, and dry up. Medicines may:  Help with pain and itching.  Help you get better sooner.  Help to prevent long-term problems. Follow these instructions at home: Medicines  Take over-the-counter and prescription medicines only as told by your doctor.  Put on an anti-itch cream or numbing cream where you have a rash, blisters, or scabs. Do this as told by your doctor. Helping with itching and discomfort   Put cold, wet cloths (cold compresses) on the area of the rash or blisters as told by your doctor.  Cool baths can help you feel better. Try adding baking soda or dry oatmeal to the water to lessen itching. Do not bathe in hot water. Blister and rash care  Keep your rash covered with a loose bandage (dressing).  Wear loose clothing that does not rub on your rash.  Keep your rash and blisters clean. To do this, wash the area with mild soap and cool water as told by your doctor.  Check your rash every day for signs of infection. Check for: ? More redness, swelling, or pain. ? Fluid or blood. ? Warmth. ? Pus or a bad smell.  Do not scratch your rash. Do not pick at your blisters. To help you to not scratch: ? Keep your fingernails clean and cut short. ? Wear gloves or mittens when  you sleep, if scratching is a problem. General instructions  Rest as told by your doctor.  Keep all follow-up visits as told by your doctor. This is important.  Wash your hands often with soap and water. If soap and water are not available, use hand sanitizer. Doing this lowers your chance of getting a skin infection caused by germs (bacteria).  Your infection can cause chickenpox in people who have never had chickenpox or never got a shot of chickenpox vaccine. If you have blisters that did not change into scabs yet, try not to touch other people or be around other people, especially: ? Babies. ? Pregnant women. ? Children who have areas of red, itchy, or rough skin (eczema). ? Very old people who have transplants. ? People who have a long-term (chronic) sickness, like cancer or AIDS. Contact a doctor if:  Your pain does not get better with medicine.  Your pain does not get better after the rash heals.  You have any signs of infection in the rash area. These signs include: ? More redness, swelling, or pain around the rash. ? Fluid or blood coming from the rash. ? The rash area feeling warm to the touch. ? Pus or a bad smell coming from the rash. Get help right away if:  The rash is on your face or nose.  You have pain in your  face or pain by your eye.  You lose feeling on one side of your face.  You have trouble seeing.  You have ear pain, or you have ringing in your ear.  You have a loss of taste.  Your condition gets worse. Summary  Shingles gives you a painful skin rash and blisters that have fluid in them.  Shingles is an infection. It is caused by the same germ (virus) that causes chickenpox.  Keep your rash covered with a loose bandage (dressing). Wear loose clothing that does not rub on your rash.  If you have blisters that did not change into scabs yet, try not to touch other people or be around people. This information is not intended to replace advice  given to you by your health care provider. Make sure you discuss any questions you have with your health care provider. Document Released: 04/04/2008 Document Revised: 06/21/2017 Document Reviewed: 06/21/2017 Elsevier Interactive Patient Education  2019 [employer].

## 2019-01-23 NOTE — Progress Notes (Signed)
   Subjective:    Patient ID: Carolyn [name], female    DOB: 1951-05-09, 68 y.o.   MRN: [id number]  Patient presents for Rash (x4 days- pain and irritation to L shoulder blade- no blisters to area at this time)  Over the weekend started having left shoulder into left shoulder upper back, no specific injury ,used naprosyn which helped a little.      Monday noticed a bump in the same region where she had pain and now seems to be spreading, pain now going down the back of her arm   Used hydrocortisone cream yesterday    No fever, no respiratory         Review Of Systems:  GEN- denies fatigue, fever, weight loss,weakness, recent illness HEENT- denies eye drainage, change in vision, nasal discharge, CVS- denies chest pain, palpitations RESP- denies SOB, cough, wheeze ABD- denies N/V, change in stools, abd pain GU- denies dysuria, hematuria, dribbling, incontinence MSK- denies joint pain, muscle aches, injury Neuro- denies headache, dizziness, syncope, seizure activity       Objective:    BP 126/64   Pulse 78   Temp 98.4 F (36.9 C) (Oral)   Resp 14   Ht 5' 2.5" (1.588 m)   Wt 196 lb (88.9 kg)   SpO2 97%   BMI 35.28 kg/m  GEN- NAD, alert and oriented x3 HEENT- PERRL, EOMI, non injected sclera, pink conjunctiva, MMM, oropharynx clear Neck- Supple, no LAD CVS- RRR, no murmur RESP-CTAB Skin- left upper back coalesced small blisters with erythema surrounding few scattered lesions in the same dermatome towards the left posterior arm.  Mild tenderness to palpation no pustular lesions. MSK-full range of motion of left upper extremity        Assessment & Plan:      Problem List Items Addressed This Visit    None    Visit Diagnoses    Herpes zoster without complication    -  Primary   Treat with valtrex, ultram for pain, if nerve pain does not improve with treatment would add lyrica 50mg  BID    Relevant Medications   valACYclovir (VALTREX) 1000 MG tablet      Note:  This dictation was prepared with Dragon dictation along with smaller phrase technology. Any transcriptional errors that result from this process are unintentional.

## 2019-01-24 ENCOUNTER — Ambulatory Visit (HOSPITAL_COMMUNITY): Payer: Medicare Other

## 2019-02-22 ENCOUNTER — Ambulatory Visit: Payer: Medicare Other | Admitting: Pulmonary Disease

## 2019-02-25 ENCOUNTER — Other Ambulatory Visit (HOSPITAL_COMMUNITY)
Admission: RE | Admit: 2019-02-25 | Discharge: 2019-02-25 | Disposition: A | Discharge status: Home / Self Care | Payer: Medicare Other | Source: Ambulatory Visit | Attending: Cardiovascular Disease | Admitting: Cardiovascular Disease

## 2019-02-25 ENCOUNTER — Telehealth (HOSPITAL_COMMUNITY): Payer: Self-pay | Admitting: Emergency Medicine

## 2019-02-25 DIAGNOSIS — Z01818 Encounter for other preprocedural examination: Secondary | ICD-10-CM | POA: Diagnosis not present

## 2019-02-25 DIAGNOSIS — R0789 Other chest pain: Secondary | ICD-10-CM | POA: Insufficient documentation

## 2019-02-25 LAB — BASIC METABOLIC PANEL
Anion gap: 9 (ref 5–15)
BUN: 13 mg/dL (ref 8–23)
CO2: 28 mmol/L (ref 22–32)
Calcium: 9.7 mg/dL (ref 8.9–10.3)
Chloride: 103 mmol/L (ref 98–111)
Creatinine, Ser: 0.79 mg/dL (ref 0.44–1.00)
GFR calc Af Amer: >60 mL/min (ref >60)
GFR calc non Af Amer: >60 mL/min (ref >60)
Glucose, Bld: 114 mg/dL — ABNORMAL HIGH (ref 70–99)
Potassium: 3.7 mmol/L (ref 3.5–5.1)
Sodium: 140 mmol/L (ref 135–145)

## 2019-02-25 NOTE — Telephone Encounter (Signed)
Reaching out to patient to offer assistance regarding upcoming cardiac imaging study; pt verbalizes understanding of appt date/time, parking situation and where to check in, pre-test NPO status and medications ordered, and verified current allergies; name and call back number provided for further questions should they arise Marchia Bond RN Navigator Cardiac Imaging [facility] [phone] office [phone] cell  Pt denies symptoms of covid 19 such as fever, cough, shortness of breath;  Pt verbalized understanding of visitor policy Pt also verbalized understanding to hold her metoprolol succinate the day of the test and to take the metoprolol tartrate 2 hr prior to scan.

## 2019-02-26 ENCOUNTER — Telehealth: Payer: Self-pay | Admitting: Cardiovascular Disease

## 2019-02-26 ENCOUNTER — Ambulatory Visit (HOSPITAL_COMMUNITY)
Admission: RE | Admit: 2019-02-26 | Discharge: 2019-02-26 | Disposition: A | Discharge status: Home / Self Care | Payer: Medicare Other | Source: Ambulatory Visit | Attending: Cardiovascular Disease | Admitting: Cardiovascular Disease

## 2019-02-26 ENCOUNTER — Other Ambulatory Visit: Payer: Self-pay

## 2019-02-26 ENCOUNTER — Ambulatory Visit (HOSPITAL_COMMUNITY): Admission: RE | Admit: 2019-02-26 | Payer: Medicare Other | Source: Ambulatory Visit

## 2019-02-26 ENCOUNTER — Encounter: Payer: Medicare Other | Admitting: *Deleted

## 2019-02-26 DIAGNOSIS — E119 Type 2 diabetes mellitus without complications: Secondary | ICD-10-CM | POA: Diagnosis not present

## 2019-02-26 DIAGNOSIS — R0609 Other forms of dyspnea: Secondary | ICD-10-CM | POA: Diagnosis not present

## 2019-02-26 DIAGNOSIS — I10 Essential (primary) hypertension: Secondary | ICD-10-CM | POA: Diagnosis not present

## 2019-02-26 DIAGNOSIS — R0789 Other chest pain: Secondary | ICD-10-CM | POA: Diagnosis not present

## 2019-02-26 DIAGNOSIS — E785 Hyperlipidemia, unspecified: Secondary | ICD-10-CM | POA: Diagnosis not present

## 2019-02-26 DIAGNOSIS — E669 Obesity, unspecified: Secondary | ICD-10-CM | POA: Diagnosis not present

## 2019-02-26 MED ORDER — METOPROLOL TARTRATE 5 MG/5ML IV SOLN: 5.0000 mg | INTRAVENOUS | Status: DC | PRN

## 2019-02-26 MED ORDER — NITROGLYCERIN 0.4 MG SL SUBL
0.8000 mg | SUBLINGUAL_TABLET | Freq: Once | SUBLINGUAL | Status: AC
  Administered 2019-02-26: 0.8 mg via SUBLINGUAL

## 2019-02-26 MED ORDER — IOHEXOL 300 MG/ML  SOLN
80.0000 mL | Freq: Once | INTRAMUSCULAR | Status: AC | PRN
  Administered 2019-02-26: 80 mL via INTRAVENOUS

## 2019-02-26 MED ORDER — NITROGLYCERIN 0.4 MG SL SUBL
SUBLINGUAL_TABLET | SUBLINGUAL | Status: AC
  Filled 2019-02-26: qty 2

## 2019-02-26 NOTE — Research (Signed)
Signed         Show:Clear all      '[]'$ Hover for details CADFEM Informed Consent   Subject Name: Carolyn [name]  Subject met inclusion and exclusion criteria.  The informed consent form, study requirements and expectations were reviewed with the subject and questions and concerns were addressed prior to the signing of the consent form.  The subject verbalized understanding of the trail requirements.  The subject agreed to participate in the CADFEM trial and signed the informed consent.  The informed consent was obtained prior to performance of any protocol-specific procedures for the subject.  A copy of the signed informed consent was given to the subject and a copy was placed in the subject's medical record.

## 2019-02-26 NOTE — Telephone Encounter (Signed)
Patient returning someones call °

## 2019-02-26 NOTE — Telephone Encounter (Signed)
Spoke with staff. Noone called this pt.

## 2019-02-26 NOTE — Progress Notes (Signed)
CT scan completed. Tolerated well. D/c home walking. Awake and alert. In no distress

## 2019-03-04 ENCOUNTER — Encounter: Payer: Self-pay | Admitting: Nurse Practitioner

## 2019-03-04 ENCOUNTER — Ambulatory Visit (INDEPENDENT_AMBULATORY_CARE_PROVIDER_SITE_OTHER): Payer: Medicare Other | Admitting: Nurse Practitioner

## 2019-03-04 ENCOUNTER — Other Ambulatory Visit: Payer: Self-pay

## 2019-03-04 DIAGNOSIS — G4730 Sleep apnea, unspecified: Secondary | ICD-10-CM | POA: Diagnosis not present

## 2019-03-04 NOTE — Assessment & Plan Note (Signed)
Patient states that she has been doing well.  She wears her CPAP nightly.  She denies any issues with mask or pressure.  We were unable to get a download today but will contact the DME company to see if they could fax a copy.  Patient states that she does need supplies for her CPAP ordered.  She states that she does benefit from wearing the CPAP and is much less drowsy throughout the day.  Patient Instructions  Patient continues to benefit from CPAP with good compliance Will order new supplies Continue CPAP at current settings Continue current medications Goal of 4 hours or more usage per night Maintain healthy weight Do not drive if drowsy  Follow up: Follow up with Dr. Halford Chessman in 6 months or sooner if needed

## 2019-03-04 NOTE — Patient Instructions (Signed)
Patient continues to benefit from CPAP with good compliance Will order new supplies Continue CPAP at current settings Continue current medications Goal of 4 hours or more usage per night Maintain healthy weight Do not drive if drowsy  Follow up: Follow up with Dr. Halford Chessman in 6 months or sooner if needed

## 2019-03-04 NOTE — Progress Notes (Signed)
Virtual Visit via Telephone Note  I connected with Carolyn [name] on 03/04/19 at 11:30 AM EDT by telephone and verified that I am speaking with the correct person using two identifiers.  Location: Patient: home Provider: office   I discussed the limitations, risks, security and privacy concerns of performing an evaluation and management service by telephone and the availability of in person appointments. I also discussed with the patient that there may be a patient responsible charge related to this service. The patient expressed understanding and agreed to proceed.   History of Present Illness: 68 year old female with OSA who is followed by Dr. Halford Chessman.  Patient has a tele-visit today for a follow-up.  She was last seen in our office on 02/20/2018 by Dr. Halford Chessman.  She states that she has been doing well.  She wears her CPAP nightly.  She denies any issues with mask or pressure.  We were unable to get a download today but will contact the DME company to see if they could fax a copy.  Patient states that she does need supplies for her CPAP ordered.  She states that she does benefit from wearing the CPAP and is much less drowsy throughout the day. Denies f/c/s, n/v/d, hemoptysis, PND, leg swelling Denies chest pain or edema    Observations/Objective: HST 10/27/15 - AHI 45  Assessment and Plan: Patient states that she has been doing well.  She wears her CPAP nightly.  She denies any issues with mask or pressure.  We were unable to get a download today but will contact the DME company to see if they could fax a copy.  Patient states that she does need supplies for her CPAP ordered.  She states that she does benefit from wearing the CPAP and is much less drowsy throughout the day.  Patient Instructions  Patient continues to benefit from CPAP with good compliance Will order new supplies Continue CPAP at current settings Continue current medications Goal of 4 hours or more usage per night Maintain  healthy weight Do not drive if drowsy    Follow Up Instructions:  Follow up with Dr. Halford Chessman in 6 months or sooner if needed    I discussed the assessment and treatment plan with the patient. The patient was provided an opportunity to ask questions and all were answered. The patient agreed with the plan and demonstrated an understanding of the instructions.   The patient was advised to call back or seek an in-person evaluation if the symptoms worsen or if the condition fails to improve as anticipated.  I provided 23 minutes of non-face-to-face time during this encounter.   Fenton Foy, NP

## 2019-03-09 ENCOUNTER — Other Ambulatory Visit: Payer: Self-pay | Admitting: Family Medicine

## 2019-03-13 ENCOUNTER — Telehealth: Payer: Self-pay | Admitting: Pulmonary Disease

## 2019-03-13 DIAGNOSIS — G4733 Obstructive sleep apnea (adult) (pediatric): Secondary | ICD-10-CM

## 2019-03-13 NOTE — Telephone Encounter (Signed)
Order placed for new cpap supplies Pt contacted and made aware. Nothing further needed.

## 2019-03-14 DIAGNOSIS — G4733 Obstructive sleep apnea (adult) (pediatric): Secondary | ICD-10-CM | POA: Diagnosis not present

## 2019-03-26 IMAGING — DX DG RIBS 2V*L*
3 series · 3 of 3 positions shown · non-contrast
Comparison: 12/12/2017.

CLINICAL DATA: Left-sided rib pain.  No known injury.

EXAM:
LEFT RIBS - 2 VIEW

[rib pa (1 of 2)]
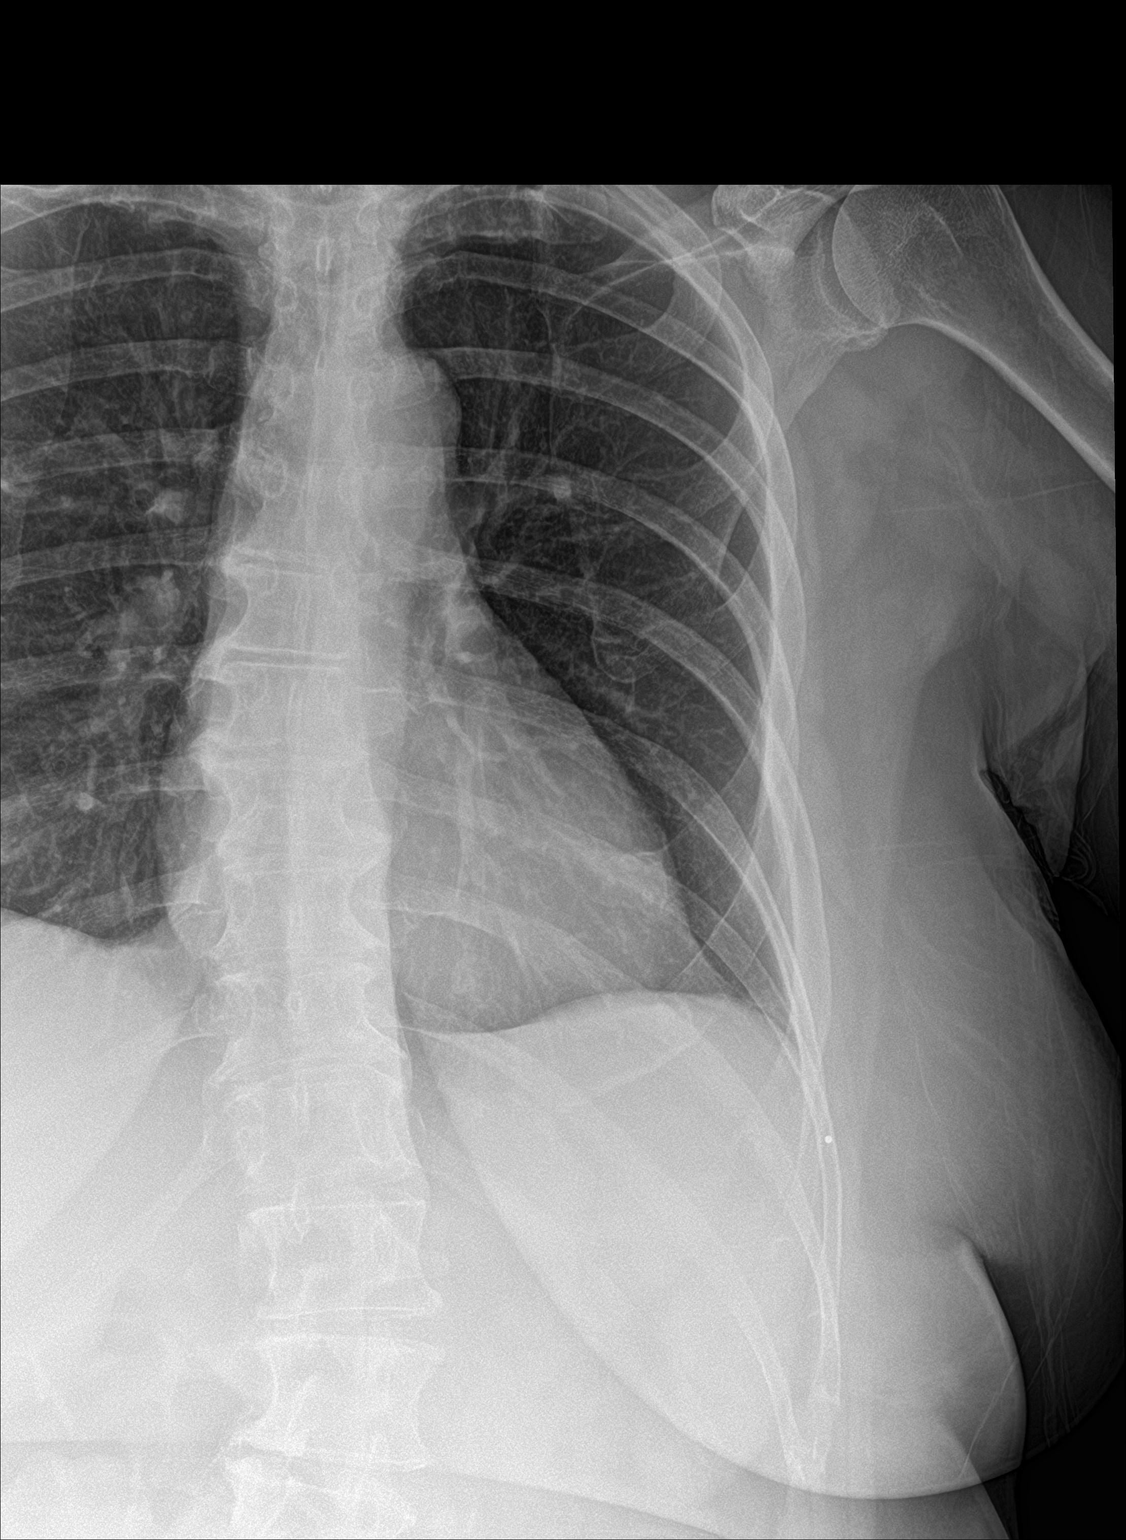

[rib pa obl]
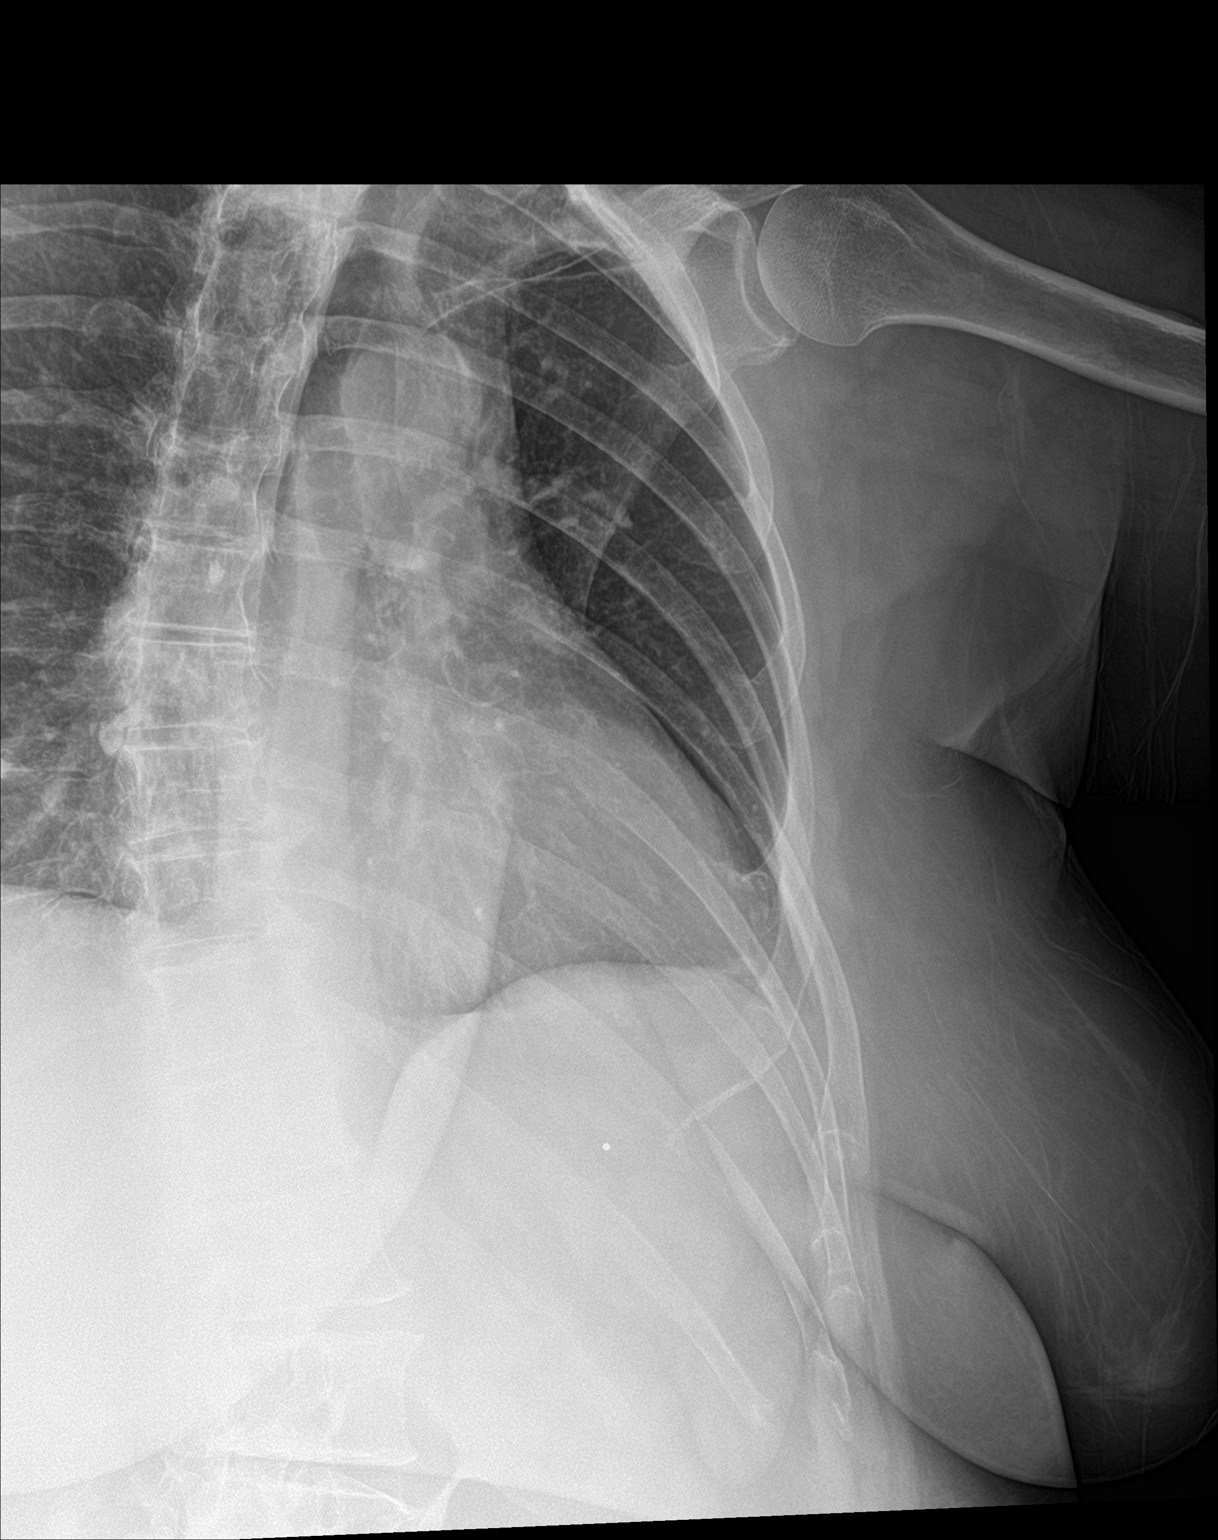

[rib pa (2 of 2)]
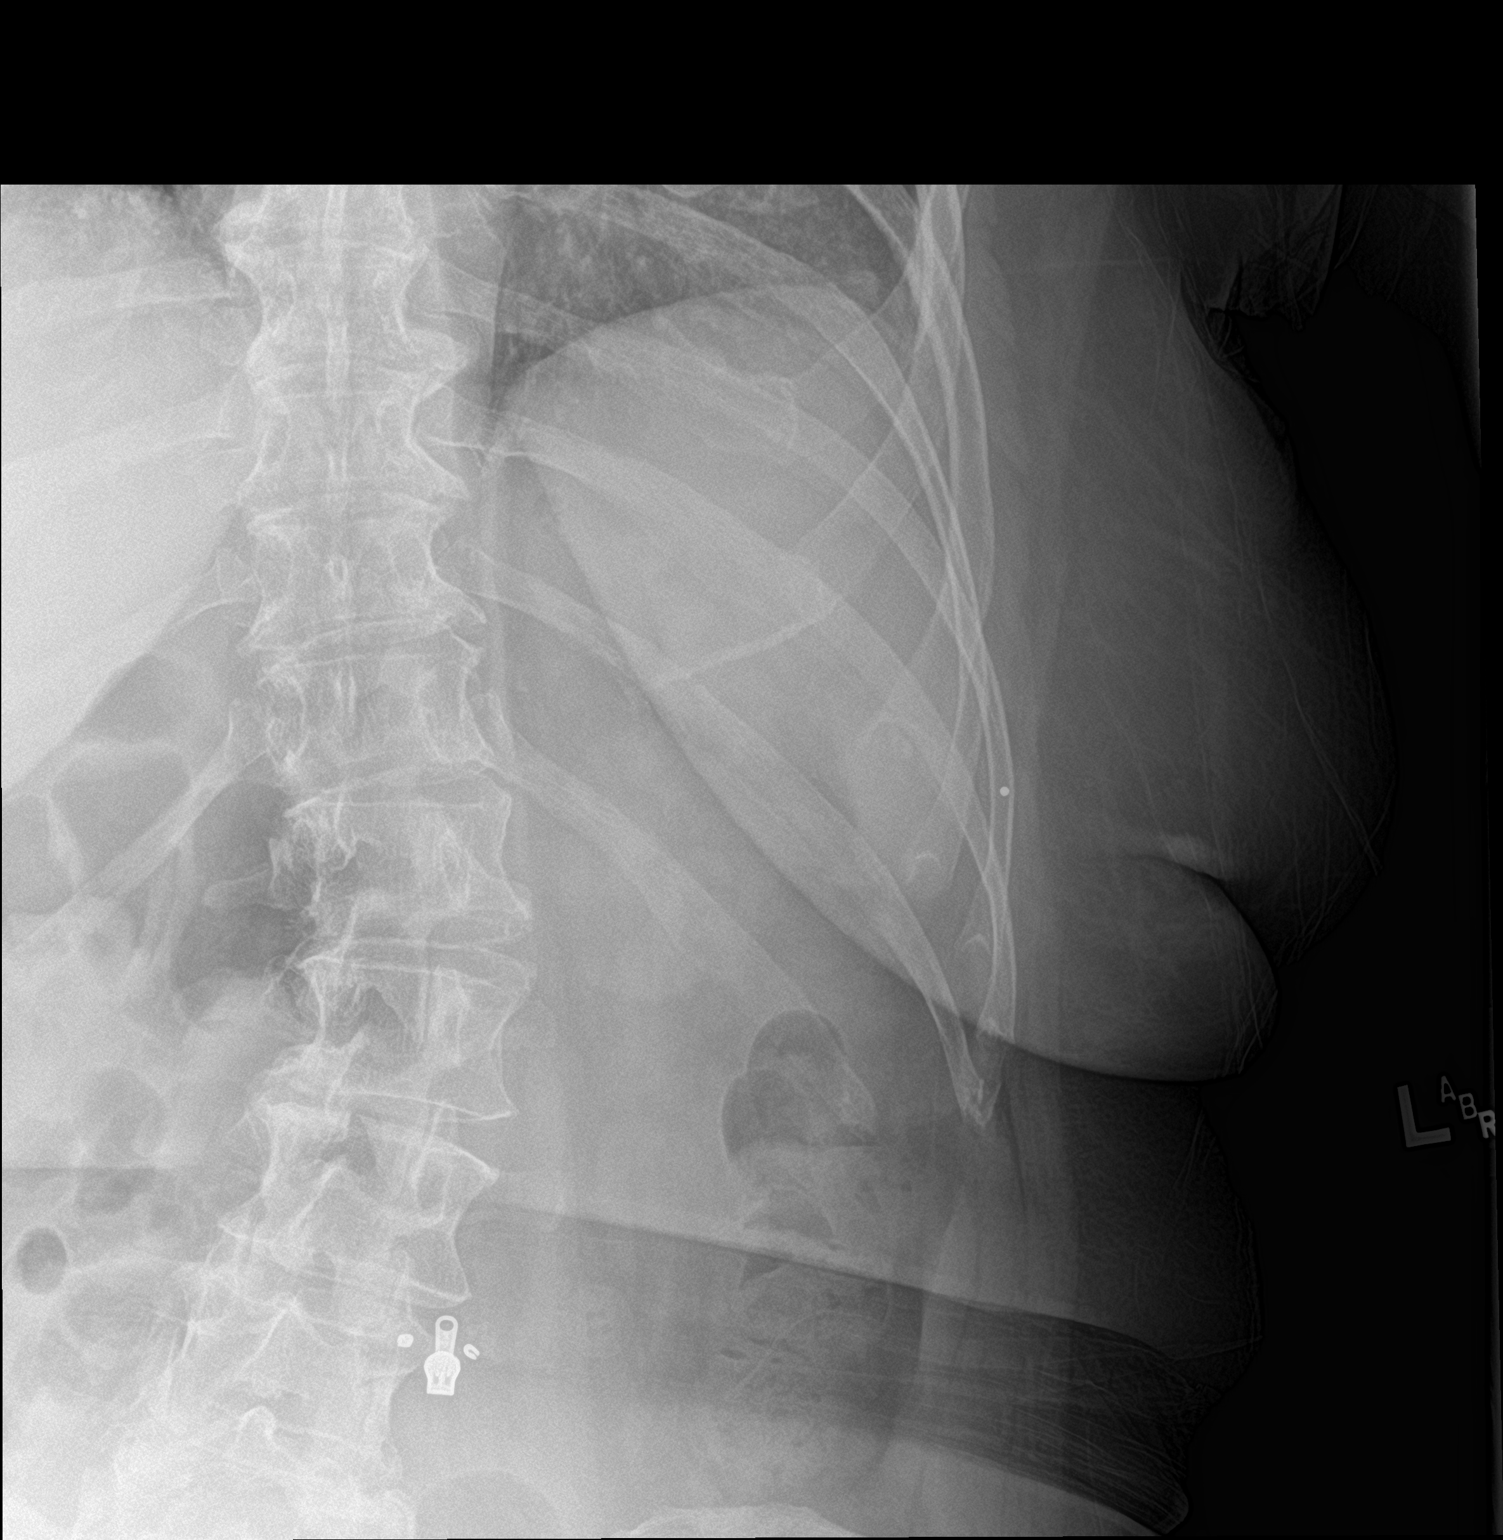

[3 of 3 positions shown; findings below may reference images not displayed]

FINDINGS: No acute or focal abnormality. No displaced rib fracture. No
pneumothorax. Thoracic spine scoliosis and degenerative change.
IMPRESSION: No acute or focal abnormality.

## 2019-03-29 ENCOUNTER — Ambulatory Visit: Payer: Medicare Other | Admitting: Family Medicine

## 2019-04-01 ENCOUNTER — Other Ambulatory Visit: Payer: Self-pay | Admitting: Family Medicine

## 2019-04-01 ENCOUNTER — Other Ambulatory Visit: Payer: Self-pay

## 2019-04-01 ENCOUNTER — Ambulatory Visit (INDEPENDENT_AMBULATORY_CARE_PROVIDER_SITE_OTHER): Payer: Medicare Other | Admitting: Family Medicine

## 2019-04-01 ENCOUNTER — Encounter: Payer: Self-pay | Admitting: Family Medicine

## 2019-04-01 VITALS — BP 104/70 | HR 72 | Temp 99.0°F | Resp 12 | Ht 63.0 in | Wt 201.0 lb

## 2019-04-01 DIAGNOSIS — I10 Essential (primary) hypertension: Secondary | ICD-10-CM | POA: Diagnosis not present

## 2019-04-01 DIAGNOSIS — E119 Type 2 diabetes mellitus without complications: Secondary | ICD-10-CM

## 2019-04-01 DIAGNOSIS — E782 Mixed hyperlipidemia: Secondary | ICD-10-CM | POA: Diagnosis not present

## 2019-04-01 DIAGNOSIS — K529 Noninfective gastroenteritis and colitis, unspecified: Secondary | ICD-10-CM | POA: Diagnosis not present

## 2019-04-01 LAB — URINALYSIS, ROUTINE W REFLEX MICROSCOPIC
Bilirubin Urine: NEGATIVE
Glucose, UA: NEGATIVE
Hgb urine dipstick: NEGATIVE
Ketones, ur: NEGATIVE
Leukocytes,Ua: NEGATIVE
Nitrite: NEGATIVE
Protein, ur: NEGATIVE
Specific Gravity, Urine: 1.025 (ref 1.001–1.03)
pH: 5.5 (ref 5.0–8.0)

## 2019-04-01 NOTE — Patient Instructions (Addendum)
F/U 4 months  Hold losartan until you are back to eating and drinking well

## 2019-04-01 NOTE — Progress Notes (Signed)
   Subjective:    Patient ID: Carolyn [name], female    DOB: [birth year], 68 y.o.   MRN: [id number]  Patient presents for Medication Management (Pt fasting) and Medication Refill N/V diarrhea, no cough or congestion, low grade fever  99.62F for past 3 days , son and daghter live with  Pushing fluids. Some abdominal cramping No non sick contacts ,able to tolerate bland foods  DM- CG this AM 121 for fasting labs she is not on any current medications.  HTN- taking BP meds , no SE of medications   No UTI symptoms   Review Of Systems:  GEN- denies fatigue, +fever, weight loss,weakness, recent illness HEENT- denies eye drainage, change in vision, nasal discharge, CVS- denies chest pain, palpitations RESP- denies SOB, cough, wheeze ABD- +N/V, +change in stools, +abd pain GU- denies dysuria, hematuria, dribbling, incontinence MSK- denies joint pain, muscle aches, injury Neuro- denies headache, dizziness, syncope, seizure activity       Objective:    BP 104/70   Pulse 72   Temp 99 F (37.2 C) (Oral)   Resp 12   Ht 5\' 3"  (1.6 m)   Wt 201 lb (91.2 kg)   SpO2 99%   BMI 35.61 kg/m  GEN- NAD, alert and oriented x3  HEENT- PERRL, EOMI, non injected sclera, pink conjunctiva, MMM, oropharynx clear Neck- Supple, no thyromegaly CVS- RRR, no murmur RESP-CTAB ABD-NABS,soft,ND, mild TTP diffusely, no guarding, no rebound  EXT- No edema Pulses- Radial, DP- 2+        Assessment & Plan:      Problem List Items Addressed This Visit      Unprioritized   Diabetes mellitus, type II ([place])    Diabetes has been diet controlled.  We will follow-up A1c goal to keep this less than 7%.      Relevant Orders   Hemoglobin A1c (Completed)   Microalbumin / creatinine urine ratio (Completed)   Hyperlipidemia   Relevant Orders   Lipid panel (Completed)   Hypertension - Primary    Pressures on the low normal side.  The setting of her current illness.  We will have her hold the losartan as she  has had nausea and vomiting do not want worsening renal function in this setting.      Relevant Orders   CBC with Differential/Platelet (Completed)   Comprehensive metabolic panel (Completed)    Other Visit Diagnoses    Gastroenteritis       concern for Viral gastroenteritis based on symptoms.  She does not have any URI symptoms at this time but if she does develop URi symptoms would test for COVID She is tolerating PO at this time, no acute abdomen, feels she is improving already   Relevant Orders   CBC with Differential/Platelet (Completed)   Comprehensive metabolic panel (Completed)   Urinalysis, Routine w reflex microscopic (Completed)      Note: This dictation was prepared with Dragon dictation along with smaller phrase technology. Any transcriptional errors that result from this process are unintentional.

## 2019-04-02 ENCOUNTER — Encounter: Payer: Self-pay | Admitting: Family Medicine

## 2019-04-02 LAB — CBC WITH DIFFERENTIAL/PLATELET
Absolute Monocytes: 825 cells/uL (ref 200–950)
Basophils Absolute: 38 cells/uL (ref 0–200)
Basophils Relative: 0.5 %
Eosinophils Absolute: 90 cells/uL (ref 15–500)
Eosinophils Relative: 1.2 %
HCT: 42.5 % (ref 35.0–45.0)
Hemoglobin: 14.8 g/dL (ref 11.7–15.5)
Lymphs Abs: 1530 cells/uL (ref 850–3900)
MCH: 32.4 pg (ref 27.0–33.0)
MCHC: 34.8 g/dL (ref 32.0–36.0)
MCV: 93 fL (ref 80.0–100.0)
MPV: 9.1 fL (ref 7.5–12.5)
Monocytes Relative: 11 %
Neutro Abs: 5018 cells/uL (ref 1500–7800)
Neutrophils Relative %: 66.9 %
Platelets: 211 10*3/uL (ref 140–400)
RBC: 4.57 10*6/uL (ref 3.80–5.10)
RDW: 12.5 % (ref 11.0–15.0)
Total Lymphocyte: 20.4 %
WBC: 7.5 10*3/uL (ref 3.8–10.8)

## 2019-04-02 LAB — COMPREHENSIVE METABOLIC PANEL
AG Ratio: 2 (calc) (ref 1.0–2.5)
ALT: 22 U/L (ref 6–29)
AST: 20 U/L (ref 10–35)
Albumin: 4.5 g/dL (ref 3.6–5.1)
Alkaline phosphatase (APISO): 61 U/L (ref 37–153)
BUN: 10 mg/dL (ref 7–25)
CO2: 27 mmol/L (ref 20–32)
Calcium: 9.3 mg/dL (ref 8.6–10.4)
Chloride: 105 mmol/L (ref 98–110)
Creat: 0.85 mg/dL (ref 0.50–0.99)
Globulin: 2.2 g/dL (calc) (ref 1.9–3.7)
Glucose, Bld: 141 mg/dL — ABNORMAL HIGH (ref 65–99)
Potassium: 4.1 mmol/L (ref 3.5–5.3)
Sodium: 141 mmol/L (ref 135–146)
Total Bilirubin: 0.8 mg/dL (ref 0.2–1.2)
Total Protein: 6.7 g/dL (ref 6.1–8.1)

## 2019-04-02 LAB — LIPID PANEL
Cholesterol: 152 mg/dL (ref <200)
HDL: 42 mg/dL — ABNORMAL LOW (ref >=50)
LDL Cholesterol (Calc): 76 mg/dL (calc)
Non-HDL Cholesterol (Calc): 110 mg/dL (calc) (ref <130)
Total CHOL/HDL Ratio: 3.6 (calc) (ref <5.0)
Triglycerides: 244 mg/dL — ABNORMAL HIGH (ref <150)

## 2019-04-02 LAB — HEMOGLOBIN A1C
Hgb A1c MFr Bld: 6.3 % of total Hgb — ABNORMAL HIGH (ref <5.7)
Mean Plasma Glucose: 134 (calc)
eAG (mmol/L): 7.4 (calc)

## 2019-04-02 LAB — MICROALBUMIN / CREATININE URINE RATIO
Creatinine, Urine: 358 mg/dL — ABNORMAL HIGH (ref 20–275)
Microalb Creat Ratio: 10 mcg/mg creat (ref <30)
Microalb, Ur: 3.6 mg/dL

## 2019-04-02 NOTE — Assessment & Plan Note (Signed)
Diabetes has been diet controlled.  We will follow-up A1c goal to keep this less than 7%.

## 2019-04-02 NOTE — Assessment & Plan Note (Signed)
Pressures on the low normal side.  The setting of her current illness.  We will have her hold the losartan as she has had nausea and vomiting do not want worsening renal function in this setting.

## 2019-04-04 ENCOUNTER — Telehealth: Payer: Self-pay | Admitting: Cardiovascular Disease

## 2019-04-04 NOTE — Telephone Encounter (Signed)
Virtual Visit Pre-Appointment Phone Call  "(Name), I am calling you today to discuss your upcoming appointment. We are currently trying to limit exposure to the virus that causes COVID-19 by seeing patients at home rather than in the office."  1. "What is the BEST phone number to call the day of the visit?" - include this in appointment notes  2. Do you have or have access to (through a family member/friend) a smartphone with video capability that we can use for your visit?" a. If yes - list this number in appt notes as cell (if different from BEST phone #) and list the appointment type as a VIDEO visit in appointment notes b. If no - list the appointment type as a PHONE visit in appointment notes  3. Confirm consent - "In the setting of the current Covid19 crisis, you are scheduled for a (phone or video) visit with your provider on (date) at (time).  Just as we do with many in-office visits, in order for you to participate in this visit, we must obtain consent.  If you'd like, I can send this to your mychart (if signed up) or email for you to review.  Otherwise, I can obtain your verbal consent now.  All virtual visits are billed to your insurance company just like a normal visit would be.  By agreeing to a virtual visit, we'd like you to understand that the technology does not allow for your provider to perform an examination, and thus may limit your provider's ability to fully assess your condition. If your provider identifies any concerns that need to be evaluated in person, we will make arrangements to do so.  Finally, though the technology is pretty good, we cannot assure that it will always work on either your or our end, and in the setting of a video visit, we may have to convert it to a phone-only visit.  In either situation, we cannot ensure that we have a secure connection.  Are you willing to proceed?" STAFF: Did the patient verbally acknowledge consent to telehealth visit? Document  YES/NO here: Yes  4. Advise patient to be prepared - "Two hours prior to your appointment, go ahead and check your blood pressure, pulse, oxygen saturation, and your weight (if you have the equipment to check those) and write them all down. When your visit starts, your provider will ask you for this information. If you have an Apple Watch or Kardia device, please plan to have heart rate information ready on the day of your appointment. Please have a pen and paper handy nearby the day of the visit as well."  5. Give patient instructions for MyChart download to smartphone OR [email or web address] as below if video visit (depending on what platform provider is using)  6. Inform patient they will receive a phone call 15 minutes prior to their appointment time (may be from unknown caller ID) so they should be prepared to answer    TELEPHONE CALL NOTE  Carolyn [name] has been deemed a candidate for a follow-up tele-health visit to limit community exposure during the Covid-19 pandemic. I spoke with the patient via phone to ensure availability of phone/video source, confirm preferred email & phone number, and discuss instructions and expectations.  I reminded Carolyn [name] to be prepared with any vital sign and/or heart rhythm information that could potentially be obtained via home monitoring, at the time of her visit. I reminded Carolyn [name] to expect a phone call prior to  her visit.  Terry L Goins 04/04/2019 1:30 PM

## 2019-04-09 ENCOUNTER — Other Ambulatory Visit: Payer: Self-pay

## 2019-04-09 ENCOUNTER — Encounter: Payer: Self-pay | Admitting: Cardiovascular Disease

## 2019-04-09 ENCOUNTER — Telehealth (INDEPENDENT_AMBULATORY_CARE_PROVIDER_SITE_OTHER): Payer: Medicare Other | Admitting: Cardiovascular Disease

## 2019-04-09 VITALS — BP 132/79 | HR 73 | Ht 63.0 in | Wt 197.0 lb

## 2019-04-09 DIAGNOSIS — R0789 Other chest pain: Secondary | ICD-10-CM

## 2019-04-09 DIAGNOSIS — I5189 Other ill-defined heart diseases: Secondary | ICD-10-CM

## 2019-04-09 DIAGNOSIS — R002 Palpitations: Secondary | ICD-10-CM

## 2019-04-09 DIAGNOSIS — I10 Essential (primary) hypertension: Secondary | ICD-10-CM

## 2019-04-09 DIAGNOSIS — E7800 Pure hypercholesterolemia, unspecified: Secondary | ICD-10-CM

## 2019-04-09 NOTE — Progress Notes (Signed)
Virtual Visit via Video Note   This visit type was conducted due to national recommendations for restrictions regarding the COVID-19 Pandemic (e.g. social distancing) in an effort to limit this patient's exposure and mitigate transmission in our community.  Due to her co-morbid illnesses, this patient is at least at moderate risk for complications without adequate follow up.  This format is felt to be most appropriate for this patient at this time.  All issues noted in this document were discussed and addressed.  A limited physical exam was performed with this format.  Please refer to the patient's chart for her consent to telehealth for [facility].   Date:  04/09/2019   ID:  Carolyn [name], DOB 01/06/1951, MRN [id number]  Patient Location: Home Provider Location: Home  PCP:  Alycia Rossetti, MD  Cardiologist:  Kate Sable, MD  Electrophysiologist:  None   Evaluation Performed:  Follow-Up Visit  Chief Complaint:  Palpitations and chest tightness  History of Present Illness:    Carolyn [name] is a 68 y.o. female with palpitations and chest tightness.  Coronary CT angiography demonstrated a calcium score of 0 with no blockages whatsoever.  She did have an episode of chest discomfort a few nights ago.  She does have a history of GERD and esophageal dysmotility.  She has several questions about her medications and whether or not she needs to stay on them such as her metoprolol, losartan, and statin.  The patient does not have symptoms concerning for COVID-19 infection (fever, chills, cough, or new shortness of breath).   Soc Hx: She and her husband moved to [place] from [place] over 2 years ago as they are [employer] and wanted to do volunteer work.  She is retired from Engineer, mining.    Past Medical History:  Diagnosis Date  . Depression   . Diabetes mellitus without complication ([place])   . Diastolic dysfunction   . Hyperlipidemia   . Myasthenia gravis  ([place])   . Patient is Jehovah's Witness   . Sleep apnea    Past Surgical History:  Procedure Laterality Date  . APPENDECTOMY    . BUNIONECTOMY    . MANDIBLE SURGERY    . TONSILECTOMY, ADENOIDECTOMY, BILATERAL MYRINGOTOMY AND TUBES       Current Meds  Medication Sig  . BIOTIN PO Take 2,500 mcg by mouth 2 (two) times daily.  . Blood Glucose Monitoring Suppl (BLOOD GLUCOSE SYSTEM PAK) KIT Use as directed to monitor FSBS 2x daily. Dx: E11.9  . Cholecalciferol (VITAMIN D3) 1000 units CAPS Take by mouth daily.  Marland Kitchen losartan (COZAAR) 25 MG tablet TAKE 1 TABLET(25 MG) BY MOUTH DAILY  . metoprolol succinate (TOPROL-XL) 25 MG 24 hr tablet TAKE 1 TABLET(25 MG) BY MOUTH DAILY  . Multiple Vitamins-Minerals (MULTI COMPLETE PO) Take by mouth daily.  . NON FORMULARY Take by mouth daily.  . NON FORMULARY Take by mouth daily.  . NON FORMULARY Take by mouth.  . NON FORMULARY Take by mouth daily.  Glory Rosebush VERIO test strip USE TO TEST BLOOD SUGAR TWICE DAILY  . Pitavastatin Calcium (LIVALO) 2 MG TABS Take 1 tablet (2 mg total) by mouth daily.  Marland Kitchen VASCEPA 1 g CAPS TAKE 2 CAPSULES BY MOUTH TWICE DAILY WITH A MEAL  . WELLBUTRIN XL 150 MG 24 hr tablet Take 1 tablet (150 mg total) by mouth daily.     Allergies:   Prozac [fluoxetine hcl]; Codeine; Other; and Sulfa antibiotics   Social History  Tobacco Use  . Smoking status: Never Smoker  . Smokeless tobacco: Never Used  Substance Use Topics  . Alcohol use: Yes    Frequency: Never    Comment: socially  . Drug use: No     Family Hx: The patient's family history includes Alcohol abuse in her father; Arthritis in her mother; Depression in her mother; Diabetes in her brother, brother, brother, and mother; Heart disease in her brother; Heart disease (age of onset: 40) in her father; Hypertension in her father and mother.  ROS:   Please see the history of present illness.     All other systems reviewed and are negative.   Prior CV studies:   The  following studies were reviewed today:  CCTA 02/26/19:  IMPRESSION: 1. Coronary calcium score of 0. This was 0 percentile for age and sex matched control.  2. Normal coronary origin with right dominance.  3. No evidence of CAD.   Echocardiogram 01/07/19:   1. The left ventricle has low normal systolic function, with an ejection fraction of 50-55%. The cavity size was normal. There is mild concentric left ventricular hypertrophy. Left ventricular diastolic Doppler parameters are consistent with impaired  relaxation. No evidence of left ventricular regional wall motion abnormalities.  2. The mitral valve is normal in structure.  3. The tricuspid valve is normal in structure.  4. The aortic valve is tricuspid.  5. No evidence of left ventricular regional wall motion abnormalities.  Labs/Other Tests and Data Reviewed:    EKG:  No ECG reviewed.  Recent Labs: 04/01/2019: ALT 22; BUN 10; Creat 0.85; Hemoglobin 14.8; Platelets 211; Potassium 4.1; Sodium 141   Recent Lipid Panel Lab Results  Component Value Date/Time   CHOL 152 04/01/2019 08:44 AM   TRIG 244 (H) 04/01/2019 08:44 AM   HDL 42 (L) 04/01/2019 08:44 AM   CHOLHDL 3.6 04/01/2019 08:44 AM   LDLCALC 76 04/01/2019 08:44 AM    Wt Readings from Last 3 Encounters:  04/09/19 197 lb (89.4 kg)  04/01/19 201 lb (91.2 kg)  01/23/19 196 lb (88.9 kg)     Objective:    Vital Signs:  BP 132/79   Pulse 73   Ht '5\' 3"'$  (1.6 m)   Wt 197 lb (89.4 kg)   BMI 34.90 kg/m    VITAL SIGNS:  reviewed  ASSESSMENT & PLAN:    1.  Palpitations: Currently on Toprol-XL 25 mg daily.  She seldom has palpitations. She says beta blockers make her feel fatigued. I told her she could consider stopping this.  2.  Hypercholesterolemia: Currently on Livalo 2 mg daily and Vascepa.  Lipid panel reviewed from 04/01/19. I recommended she stay on statin and Vascepa given her pre-diabetes.  3.  Hypertension: Blood pressure is normal.  No changes to therapy  today.  4.  Chest discomfort/tightness: Coronary CT angiography was entirely normal. Echocardiogram demonstrated normal LV systolic function. No further cardiac testing is indicated at this time. Symptoms may be related to a functional esophageal disorder, for which she could be seen by GI. I will defer this to her PCP.    COVID-19 Education: The signs and symptoms of COVID-19 were discussed with the patient and how to seek care for testing (follow up with PCP or arrange E-visit).  The importance of social distancing was discussed today.  Time:   Today, I have spent 15 minutes with the patient with telehealth technology discussing the above problems.     Medication Adjustments/Labs and Tests Ordered: Current  medicines are reviewed at length with the patient today.  Concerns regarding medicines are outlined above.   Tests Ordered: No orders of the defined types were placed in this encounter.   Medication Changes: No orders of the defined types were placed in this encounter.   Disposition:  Follow up prn  Signed, Kate Sable, MD  04/09/2019 11:53 AM    [place]

## 2019-04-09 NOTE — Progress Notes (Signed)
Medication Instructions:   Your physician recommends that you continue on your current medications as directed. Please refer to the Current Medication list given to you today.  Labwork:  none  Testing/Procedures:  none  Follow-Up:  Your physician recommends that you schedule a follow-up appointment in: as needed.  Any Other Special Instructions Will Be Listed Below (If Applicable).  If you need a refill on your cardiac medications before your next appointment, please call your pharmacy. 

## 2019-04-22 ENCOUNTER — Other Ambulatory Visit: Payer: Self-pay

## 2019-04-22 ENCOUNTER — Ambulatory Visit (INDEPENDENT_AMBULATORY_CARE_PROVIDER_SITE_OTHER): Payer: Medicare Other | Admitting: Family Medicine

## 2019-04-22 ENCOUNTER — Encounter: Payer: Self-pay | Admitting: Family Medicine

## 2019-04-22 VITALS — BP 122/64 | HR 70 | Temp 98.7°F | Resp 14 | Ht 63.0 in | Wt 202.0 lb

## 2019-04-22 DIAGNOSIS — E119 Type 2 diabetes mellitus without complications: Secondary | ICD-10-CM

## 2019-04-22 DIAGNOSIS — R51 Headache: Secondary | ICD-10-CM | POA: Diagnosis not present

## 2019-04-22 DIAGNOSIS — I10 Essential (primary) hypertension: Secondary | ICD-10-CM | POA: Diagnosis not present

## 2019-04-22 DIAGNOSIS — R519 Headache, unspecified: Secondary | ICD-10-CM

## 2019-04-22 MED ORDER — SUMATRIPTAN SUCCINATE 100 MG PO TABS: 100.0000 mg | ORAL_TABLET | ORAL | 0 refills | Status: DC | PRN

## 2019-04-22 NOTE — Patient Instructions (Addendum)
Take 1/2 tablet of the Toprol Try the imitrex Try allergy medication see if this helps  We will call with lab results  F/U pending results  For Mr. Chillemi- [phone] [facility]

## 2019-04-22 NOTE — Progress Notes (Signed)
Subjective:    Patient ID: Carolyn [name], female    DOB: November 02, 1950, 68 y.o.   MRN: [id number]  Patient presents for Frequent HA (x2 weeks- almost daily HA with intermittent severity- some nausea wth or without food) and FSBS Elevated (no change to diet or meds, but AM FSBS higher)  Past few weeks, has had headache almost every day  Had GI virla illness a few weeks ago. Initially thought it was her sinuses, but headache is mostly frontal and on top of head, but this AM was in posterior. No change in vision, has some nausea associated. She still has pressure in face, but no drainage, using nasal saline, states it is not any different than her usual and doesn't typically cause a HA   - took naprosyn   A1C 3 weeks ago 6.3% , CBG past few weeks 150-180  trying to drink more water, hs not chnaged her eating habits    Saw neurologist a few years ago for her Myasthenia Gravis, was given something for sleep and headaches, never diagnosed with migraines but she didn't follow up.    CPAP is working properly   No tick bites that she is aware of    Note also reviewed her cardiology visit recently.  They did discuss possibly decreasing her beta-blocker because of her fatigue but there was no mention of the headaches.  Also noted that they did not find any reason for her chest pain and recommend she go back to GI for possible EGD which was noted in the records last year for her esophageal dysmotility to hold off at this time.   Review Of Systems:  GEN-+fatigue, denies fever, weight loss,weakness, recent illness HEENT- denies eye drainage, change in vision, nasal discharge, CVS- denies chest pain, palpitations RESP- denies SOB, cough, wheeze ABD- denies N/V, change in stools, abd pain GU- denies dysuria, hematuria, dribbling, incontinence MSK- denies joint pain, muscle aches, injury Neuro-+ headache, denies dizziness, syncope, seizure activity       Objective:    BP 122/64   Pulse 70    Temp 98.7 F (37.1 C) (Oral)   Resp 14   Ht 5\' 3"  (1.6 m)   Wt 202 lb (91.6 kg)   SpO2 96%   BMI 35.78 kg/m  GEN- NAD, alert and oriented x3 HEENT- PERRL, EOMI, non injected sclera, pink conjunctiva, MMM, oropharynx clear Neck- Supple, no thyromegaly, no bruit  CVS- RRR, no murmur RESP-CTAB ABD-NABS,soft,NT,ND NEURO-CNII-XII in tact EXT- No edema Pulses- Radial, DP- 2+        Assessment & Plan:      Problem List Items Addressed This Visit      Unprioritized   Diabetes mellitus, type II ([facility])    Spike in CBG with no change in diet No source for infection Recheck A1C see if last was accurate       Relevant Orders   Hemoglobin A1c   Hypertension - Primary    Well controlled but with her headache, fatigue Decrease toprol to  12.5mg  once a day       Relevant Orders   Comprehensive metabolic panel   CBC with Differential/Platelet    Other Visit Diagnoses    Nonintractable headache, unspecified chronicity pattern, unspecified headache type       2 week HA, offered toradol but pt needed to drive, given imitrex, for possible migraine, treat allergies with oral anti-histamine, decrease Toprol per above No red flags on neurological exam  May need to return to  neurology, as she does have myasthenia gravis history but presented with ocular symptoms at that time   Relevant Medications   SUMAtriptan (IMITREX) 100 MG tablet      Note: This dictation was prepared with Dragon dictation along with smaller phrase technology. Any transcriptional errors that result from this process are unintentional.

## 2019-04-22 NOTE — Assessment & Plan Note (Signed)
Well controlled but with her headache, fatigue Decrease toprol to  12.5mg  once a day

## 2019-04-22 NOTE — Assessment & Plan Note (Signed)
Spike in CBG with no change in diet No source for infection Recheck A1C see if last was accurate

## 2019-04-23 LAB — HEMOGLOBIN A1C
Hgb A1c MFr Bld: 6.4 % of total Hgb — ABNORMAL HIGH (ref <5.7)
Mean Plasma Glucose: 137 (calc)
eAG (mmol/L): 7.6 (calc)

## 2019-04-23 LAB — CBC WITH DIFFERENTIAL/PLATELET
Absolute Monocytes: 507 cells/uL (ref 200–950)
Basophils Absolute: 51 cells/uL (ref 0–200)
Basophils Relative: 0.9 %
Eosinophils Absolute: 120 cells/uL (ref 15–500)
Eosinophils Relative: 2.1 %
HCT: 41.7 % (ref 35.0–45.0)
Hemoglobin: 14.4 g/dL (ref 11.7–15.5)
Lymphs Abs: 1921 cells/uL (ref 850–3900)
MCH: 32.3 pg (ref 27.0–33.0)
MCHC: 34.5 g/dL (ref 32.0–36.0)
MCV: 93.5 fL (ref 80.0–100.0)
MPV: 9.5 fL (ref 7.5–12.5)
Monocytes Relative: 8.9 %
Neutro Abs: 3101 cells/uL (ref 1500–7800)
Neutrophils Relative %: 54.4 %
Platelets: 229 10*3/uL (ref 140–400)
RBC: 4.46 10*6/uL (ref 3.80–5.10)
RDW: 12.3 % (ref 11.0–15.0)
Total Lymphocyte: 33.7 %
WBC: 5.7 10*3/uL (ref 3.8–10.8)

## 2019-04-23 LAB — COMPREHENSIVE METABOLIC PANEL
AG Ratio: 2 (calc) (ref 1.0–2.5)
ALT: 26 U/L (ref 6–29)
AST: 26 U/L (ref 10–35)
Albumin: 4.5 g/dL (ref 3.6–5.1)
Alkaline phosphatase (APISO): 63 U/L (ref 37–153)
BUN: 9 mg/dL (ref 7–25)
CO2: 27 mmol/L (ref 20–32)
Calcium: 9.6 mg/dL (ref 8.6–10.4)
Chloride: 105 mmol/L (ref 98–110)
Creat: 0.9 mg/dL (ref 0.50–0.99)
Globulin: 2.2 g/dL (calc) (ref 1.9–3.7)
Glucose, Bld: 144 mg/dL — ABNORMAL HIGH (ref 65–99)
Potassium: 4.5 mmol/L (ref 3.5–5.3)
Sodium: 141 mmol/L (ref 135–146)
Total Bilirubin: 0.9 mg/dL (ref 0.2–1.2)
Total Protein: 6.7 g/dL (ref 6.1–8.1)

## 2019-05-15 ENCOUNTER — Ambulatory Visit: Payer: Medicare Other | Admitting: Nurse Practitioner

## 2019-05-28 ENCOUNTER — Other Ambulatory Visit: Payer: Self-pay | Admitting: Family Medicine

## 2019-06-11 NOTE — Progress Notes (Signed)
Reviewed and agree with assessment/plan.   Zayne Marovich, MD [facility] 10/26/2016, 12:24 PM Pager:  [phone]  

## 2019-06-30 ENCOUNTER — Other Ambulatory Visit: Payer: Self-pay | Admitting: Family Medicine

## 2019-07-12 ENCOUNTER — Other Ambulatory Visit: Payer: Self-pay | Admitting: Family Medicine

## 2019-08-09 DIAGNOSIS — G4733 Obstructive sleep apnea (adult) (pediatric): Secondary | ICD-10-CM | POA: Diagnosis not present

## 2019-09-05 ENCOUNTER — Other Ambulatory Visit: Payer: Self-pay | Admitting: *Deleted

## 2019-09-05 MED ORDER — LIVALO 2 MG PO TABS: 1.0000 | ORAL_TABLET | Freq: Every day | ORAL | 2 refills | Status: DC

## 2019-10-15 ENCOUNTER — Encounter: Payer: Self-pay | Admitting: Family Medicine

## 2019-10-15 ENCOUNTER — Telehealth (INDEPENDENT_AMBULATORY_CARE_PROVIDER_SITE_OTHER): Payer: Medicare Other | Admitting: Family Medicine

## 2019-10-15 VITALS — Wt 203.0 lb

## 2019-10-15 DIAGNOSIS — E119 Type 2 diabetes mellitus without complications: Secondary | ICD-10-CM | POA: Diagnosis not present

## 2019-10-15 DIAGNOSIS — E782 Mixed hyperlipidemia: Secondary | ICD-10-CM | POA: Diagnosis not present

## 2019-10-15 DIAGNOSIS — I10 Essential (primary) hypertension: Secondary | ICD-10-CM | POA: Diagnosis not present

## 2019-10-15 DIAGNOSIS — Z6835 Body mass index (BMI) 35.0-35.9, adult: Secondary | ICD-10-CM

## 2019-10-15 NOTE — Progress Notes (Signed)
Virtual Visit via Video Note  I connected with Carolyn [name] on 10/15/19 at 12:02 PM EST by a video enabled telemedicine application and verified that I am speaking with the correct person using two identifiers.      Pt location: at home   Physician location:  In office, [facility], Vic Blackbird MD     On call: patient and physician   I discussed the limitations of evaluation and management by telemedicine and the availability of in person appointments. The patient expressed understanding and agreed to proceed.  History of Present Illness: Telehealth visit to follow-up chronic medical problems in the setting of COVID-19. HTN- currently on losartan 25mg  and Toprol 5 mg once a day.  Her blood pressures have been a little elevated over the past few months.  They range from 120-140/80-90.  No chest pain shortness of breath associated her headaches have actually improved.  She cannot tolerate the Imitrex but has not required anything for headaches the past couple months.  Concerned about her blood sugars.  Diabetes mellitus her A1c was last 6.4% in June.  She is not on any current medications.  She has been trying to work on dietary changes she has gained a couple of pounds.  Her fasting blood sugars have been from 160 to low 200s.  She has had some fatigue associated but thinks that this may be due to other things.  She did visit a naturopathic doctor about a month ago.  She was started on a couple of different supplements including a gallbladder supplement/digestive enzyme supplement/immune health supplement including elderberry and kidney activator.  She is continued on her omega-3. With regards to the nutrition she tried intermittent fasting but did not notice much difference.  She is now cutting out sugars as much as possible decreasing her bread pasta potatoes/carbs in general.  Hyperlipidemia she is still taking her Livalo and the omega-3 per above without any  difficulty.    Observations/Objective: No acute distress noted over the phone patient was able to visualize me that I was unable to visualize her.  Assessment and Plan: Diabetes mellitus concern for elevated fasting blood sugars that she may need to restart her Metformin.  If A1c is greater than 7% we will restart Metformin at 500 mg once a day.  Hyperlipidemia we will recheck her lipid panel as well as her liver function test.  Continue the Livalo and the omega-3  Hypertension blood pressures have been up and down.  We will continue Toprol and losartan for now check her renal function.  We will also check urine microalbumin.  She will continue to monitor at home. If consistently > 140/90    I would increase her losartan to 50 mg and leave the Toprol at the current dose because she has had some fatigue associated with her beta-blocker  Obesity she is working on dietary changes per above also on some supplements. Follow Up Instructions:    I discussed the assessment and treatment plan with the patient. The patient was provided an opportunity to ask questions and all were answered. The patient agreed with the plan and demonstrated an understanding of the instructions.   The patient was advised to call back or seek an in-person evaluation if the symptoms worsen or if the condition fails to improve as anticipated.  I provided 18  minutes of non-face-to-face time during this encounter. End Time: 12:20  Vic Blackbird, MD

## 2019-10-18 ENCOUNTER — Other Ambulatory Visit: Payer: Self-pay

## 2019-10-18 ENCOUNTER — Other Ambulatory Visit: Payer: Medicare Other

## 2019-10-18 DIAGNOSIS — I10 Essential (primary) hypertension: Secondary | ICD-10-CM

## 2019-10-18 DIAGNOSIS — E782 Mixed hyperlipidemia: Secondary | ICD-10-CM | POA: Diagnosis not present

## 2019-10-18 DIAGNOSIS — E119 Type 2 diabetes mellitus without complications: Secondary | ICD-10-CM | POA: Diagnosis not present

## 2019-10-19 LAB — MICROALBUMIN / CREATININE URINE RATIO
Creatinine, Urine: 21 mg/dL (ref 20–275)
Microalb Creat Ratio: 24 mcg/mg creat (ref <30)
Microalb, Ur: 0.5 mg/dL

## 2019-10-19 LAB — CBC WITH DIFFERENTIAL/PLATELET
Absolute Monocytes: 647 cells/uL (ref 200–950)
Basophils Absolute: 31 cells/uL (ref 0–200)
Basophils Relative: 0.5 %
Eosinophils Absolute: 214 cells/uL (ref 15–500)
Eosinophils Relative: 3.5 %
HCT: 46.6 % — ABNORMAL HIGH (ref 35.0–45.0)
Hemoglobin: 15.8 g/dL — ABNORMAL HIGH (ref 11.7–15.5)
Lymphs Abs: 2306 cells/uL (ref 850–3900)
MCH: 31.9 pg (ref 27.0–33.0)
MCHC: 33.9 g/dL (ref 32.0–36.0)
MCV: 94.1 fL (ref 80.0–100.0)
MPV: 9.6 fL (ref 7.5–12.5)
Monocytes Relative: 10.6 %
Neutro Abs: 2904 cells/uL (ref 1500–7800)
Neutrophils Relative %: 47.6 %
Platelets: 236 10*3/uL (ref 140–400)
RBC: 4.95 10*6/uL (ref 3.80–5.10)
RDW: 12.5 % (ref 11.0–15.0)
Total Lymphocyte: 37.8 %
WBC: 6.1 10*3/uL (ref 3.8–10.8)

## 2019-10-19 LAB — LIPID PANEL
Cholesterol: 139 mg/dL (ref <200)
HDL: 40 mg/dL — ABNORMAL LOW (ref >=50)
LDL Cholesterol (Calc): 68 mg/dL (calc)
Non-HDL Cholesterol (Calc): 99 mg/dL (calc) (ref <130)
Total CHOL/HDL Ratio: 3.5 (calc) (ref <5.0)
Triglycerides: 265 mg/dL — ABNORMAL HIGH (ref <150)

## 2019-10-19 LAB — HEMOGLOBIN A1C
Hgb A1c MFr Bld: 7.4 % of total Hgb — ABNORMAL HIGH (ref <5.7)
Mean Plasma Glucose: 166 (calc)
eAG (mmol/L): 9.2 (calc)

## 2019-10-19 LAB — COMPREHENSIVE METABOLIC PANEL
AG Ratio: 2 (calc) (ref 1.0–2.5)
ALT: 35 U/L — ABNORMAL HIGH (ref 6–29)
AST: 33 U/L (ref 10–35)
Albumin: 4.7 g/dL (ref 3.6–5.1)
Alkaline phosphatase (APISO): 73 U/L (ref 37–153)
BUN: 11 mg/dL (ref 7–25)
CO2: 23 mmol/L (ref 20–32)
Calcium: 9.8 mg/dL (ref 8.6–10.4)
Chloride: 104 mmol/L (ref 98–110)
Creat: 0.86 mg/dL (ref 0.50–0.99)
Globulin: 2.3 g/dL (calc) (ref 1.9–3.7)
Glucose, Bld: 173 mg/dL — ABNORMAL HIGH (ref 65–99)
Potassium: 4.5 mmol/L (ref 3.5–5.3)
Sodium: 141 mmol/L (ref 135–146)
Total Bilirubin: 0.8 mg/dL (ref 0.2–1.2)
Total Protein: 7 g/dL (ref 6.1–8.1)

## 2019-10-21 ENCOUNTER — Other Ambulatory Visit: Payer: Self-pay | Admitting: *Deleted

## 2019-10-21 MED ORDER — METFORMIN HCL 500 MG PO TABS: 500.0000 mg | ORAL_TABLET | Freq: Every day | ORAL | 3 refills | Status: DC

## 2019-11-11 ENCOUNTER — Other Ambulatory Visit: Payer: Self-pay | Admitting: Family Medicine

## 2019-11-15 ENCOUNTER — Encounter: Payer: Self-pay | Admitting: Family Medicine

## 2019-11-15 ENCOUNTER — Telehealth (INDEPENDENT_AMBULATORY_CARE_PROVIDER_SITE_OTHER): Payer: Medicare Other | Admitting: Family Medicine

## 2019-11-15 DIAGNOSIS — I10 Essential (primary) hypertension: Secondary | ICD-10-CM

## 2019-11-15 DIAGNOSIS — F339 Major depressive disorder, recurrent, unspecified: Secondary | ICD-10-CM

## 2019-11-15 DIAGNOSIS — J320 Chronic maxillary sinusitis: Secondary | ICD-10-CM

## 2019-11-15 DIAGNOSIS — Z20822 Contact with and (suspected) exposure to covid-19: Secondary | ICD-10-CM

## 2019-11-15 MED ORDER — LOSARTAN POTASSIUM 50 MG PO TABS: 50.0000 mg | ORAL_TABLET | Freq: Every day | ORAL | 2 refills | Status: DC

## 2019-11-15 MED ORDER — WELLBUTRIN XL 300 MG PO TB24: 300.0000 mg | ORAL_TABLET | Freq: Every day | ORAL | 2 refills | Status: DC

## 2019-11-15 NOTE — Progress Notes (Signed)
Virtual Visit via Video Note  I connected with Carolyn [name] on 11/15/19 at 12:38pm by a video enabled telemedicine application and verified that I am speaking with the correct person using two identifiers.    Pt location: at home   Physician location:  In office, [facility], Vic Blackbird MD     On call: patient and physician   I discussed the limitations of evaluation and management by telemedicine and the availability of in person appointments. The patient expressed understanding and agreed to proceed.  History of Present Illness: Video visit secondary to sick symptoms.  Patient has chronic sinusitis states she has chronic sinus pressure and drainage on and off.  She typically uses [employer].  The past few days however has had increased sinus pressure mostly around the left side of her face.  She took her temperature yesterday it was 96.78F which is a little lower than her typical around 1F.  She also felt achy had some GI upset no nausea vomiting or diarrhea associated though.  She has had some cough that is mostly from postnasal drip.  She has been sneezing and blowing her nose more as well.  No known sick contacts.  Pretension her blood pressure has been running up.  States that her top number has been in the 140s or higher.  She is taking losartan 25 mg once a day along with metoprolol 25 mg once a day.  She has not had any palpitations or chest pain.  Major depression- she has had increased stressors especially with staying home in the setting of the COVID-19 crisis and everything in the community things have been very difficult at times.  She would like to increase her Wellbutrin to the next step.  Currently taking 150 mg once a day.   Observations/Objective: Video-acute distress noted over video.  Normal work of breathing.  Normal speech.  Normal affect and mood.  Assessment and Plan: #1 sinusitis along with viral symptoms concerning for COVID-19 infection.   Recommend she get swab done.  She was given the information on how to set this up.  She can use Nettie pot which she did ask specifically about also recommend adding in antihistamine such as Claritin.  Keep yourself hydrated.  Keep a check on her temperature.   #2 major depression we will increase Wellbutrin to 300 mg extended release and see how she does.  We will follow-up in 4 weeks with regards to the medication. #3 hypertension increase losartan to 50 mg once a day continue metoprolol 25 mg once a day.  She will monitor her blood pressures we will see how her readings improved at her next visit in 4 weeks.  Follow Up Instructions: 1 MONTH Telehealth visit      I discussed the assessment and treatment plan with the patient. The patient was provided an opportunity to ask questions and all were answered. The patient agreed with the plan and demonstrated an understanding of the instructions.   The patient was advised to call back or seek an in-person evaluation if the symptoms worsen or if the condition fails to improve as anticipated.  I provided 20 minutes of non-face-to-face time during this encounter. End Time 12:58pm  Vic Blackbird, MD

## 2019-11-18 ENCOUNTER — Other Ambulatory Visit: Payer: Self-pay | Admitting: Family Medicine

## 2019-11-20 ENCOUNTER — Telehealth: Payer: Self-pay | Admitting: *Deleted

## 2019-11-20 NOTE — Telephone Encounter (Signed)
Received call from patient.   Reports that she was tested for COVID at [facility]. States that she received results and noted negative.   PCP made aware.

## 2019-12-13 ENCOUNTER — Telehealth (INDEPENDENT_AMBULATORY_CARE_PROVIDER_SITE_OTHER): Payer: Medicare Other | Admitting: Family Medicine

## 2019-12-13 ENCOUNTER — Other Ambulatory Visit: Payer: Self-pay | Admitting: Family Medicine

## 2019-12-13 ENCOUNTER — Encounter: Payer: Self-pay | Admitting: Family Medicine

## 2019-12-13 ENCOUNTER — Encounter: Payer: Self-pay | Admitting: *Deleted

## 2019-12-13 DIAGNOSIS — I10 Essential (primary) hypertension: Secondary | ICD-10-CM

## 2019-12-13 DIAGNOSIS — E782 Mixed hyperlipidemia: Secondary | ICD-10-CM

## 2019-12-13 DIAGNOSIS — E119 Type 2 diabetes mellitus without complications: Secondary | ICD-10-CM

## 2019-12-13 DIAGNOSIS — F339 Major depressive disorder, recurrent, unspecified: Secondary | ICD-10-CM

## 2019-12-13 DIAGNOSIS — G7000 Myasthenia gravis without (acute) exacerbation: Secondary | ICD-10-CM | POA: Diagnosis not present

## 2019-12-13 MED ORDER — LOSARTAN POTASSIUM 100 MG PO TABS: 100.0000 mg | ORAL_TABLET | Freq: Every day | ORAL | Status: DC

## 2019-12-13 NOTE — Progress Notes (Signed)
Virtual Visit via Video Note  I connected with Carolyn [name] on 12/13/19 at  2:00 PM EST by a video enabled telemedicine application and verified that I am speaking with the correct person using two identifiers.      Pt location: at home   Physician location:  In office, [facility], Vic Blackbird MD     On call: patient and physician   I discussed the limitations of evaluation and management by telemedicine and the availability of in person appointments. The patient expressed understanding and agreed to proceed.  History of Present Illness: Telehealth visit to follow-up medications.  Hypertension at her last couple of visit we have been monitoring her blood pressure.  She had noticed a trend upwards.  She was continued on Toprol and her losartan was increased to 50 mg once a day approximately 4 weeks ago.  Her blood pressure at home has still been elevated.  Most of them have been 140s to 150s over 90s to 100.  The highest was 156/100 this morning.  Though she does admit there is been some stress at the home today.  The lowest blood sugar she has had is 126/88.  Denies any chest pain shortness of breath   Diabetes mellitus restarted on Metformin 500 mg daily with breakfast after A1c returned at 7.4% in December CBGs have ranged mostly 160s fasting which is significant improvement.  No hypoglycemia symptoms  Depression she has an increasing stressors as well as the past few months.  I increase her Wellbutrin to 300 mg extended release.  She feels much better at the higher dose of Wellbutrin however she has noted an itching sensation on her upper back.  She does not have any rash or swelling.  Her mood improvement outweighs her wanting to stop the medication.  She has not had to use any Benadryl or any topical steroid.  Questions about Covid vaccine with regards to her history of myasthenia.  She would also like to have levels drawn to see if she still has antibodies still some  symptoms on and off but not consistent.   Reviewed last set of labs with pt  Observations/Objective: No acute distress noted over video.  Normal work of breathing.  Normal affect and mood  Assessment and Plan: Major depression continue with Wellbutrin 300 mg once a day.  For the mild itching she can use antiitch lotion or topical cortisone there are no hives to suggest a true medication allergy.  Diabetes mellitus continue Metformin 500 mg once a day she is working on dietary changes.  We will recheck her labs in a couple months  Hypertension increase losartan to 100 mg once a day.  She will send [place] blood pressure readings in 2 weeks.  Think she can safely get COVID-19 vaccine.  We will send some information via MyChart to her about scheduling this  Follow Up Instructions:    I discussed the assessment and treatment plan with the patient. The patient was provided an opportunity to ask questions and all were answered. The patient agreed with the plan and demonstrated an understanding of the instructions.   The patient was advised to call back or seek an in-person evaluation if the symptoms worsen or if the condition fails to improve as anticipated.  I provided 20 minutes of non-face-to-face time during this encounter. End 2:20pm  Vic Blackbird, MD

## 2019-12-16 DIAGNOSIS — G4733 Obstructive sleep apnea (adult) (pediatric): Secondary | ICD-10-CM | POA: Diagnosis not present

## 2019-12-31 ENCOUNTER — Encounter: Payer: Self-pay | Admitting: Family Medicine

## 2020-01-20 ENCOUNTER — Encounter: Payer: Self-pay | Admitting: Family Medicine

## 2020-01-22 ENCOUNTER — Encounter: Payer: Self-pay | Admitting: Family Medicine

## 2020-01-22 ENCOUNTER — Other Ambulatory Visit: Payer: Self-pay

## 2020-01-22 ENCOUNTER — Ambulatory Visit (INDEPENDENT_AMBULATORY_CARE_PROVIDER_SITE_OTHER): Payer: Medicare Other | Admitting: Family Medicine

## 2020-01-22 VITALS — BP 142/86 | HR 82 | Temp 97.3°F | Resp 18 | Ht 63.0 in | Wt 206.0 lb

## 2020-01-22 DIAGNOSIS — R5383 Other fatigue: Secondary | ICD-10-CM | POA: Diagnosis not present

## 2020-01-22 DIAGNOSIS — K224 Dyskinesia of esophagus: Secondary | ICD-10-CM

## 2020-01-22 DIAGNOSIS — E119 Type 2 diabetes mellitus without complications: Secondary | ICD-10-CM

## 2020-01-22 DIAGNOSIS — I10 Essential (primary) hypertension: Secondary | ICD-10-CM

## 2020-01-22 DIAGNOSIS — G7000 Myasthenia gravis without (acute) exacerbation: Secondary | ICD-10-CM

## 2020-01-22 DIAGNOSIS — F339 Major depressive disorder, recurrent, unspecified: Secondary | ICD-10-CM | POA: Diagnosis not present

## 2020-01-22 MED ORDER — WELLBUTRIN XL 150 MG PO TB24: ORAL_TABLET | ORAL | 3 refills | Status: DC

## 2020-01-22 NOTE — Progress Notes (Signed)
Subjective:    Patient ID: Carolyn [name], female    DOB: [birth year], 69 y.o.   MRN: [id number]  Patient presents for Hypertension and Depression Pt here to f/u medications  last 3 visits telehealth    Hypertension at her last couple of visit we have been monitoring her blood pressure.  She had noticed a trend upwards.  She was continued on Toprol and her losartan was increased to100 mg once a day approximately 4 weeks ago.  Her blood pressure at home has still been elevated.  Most of them have been 140s to 150s over 90s to 100. Marland Kitchen He has not had any chest pain or shortness of breath.  He did have one episode headache that lasted a few days but he think that was from grinding her teeth.  She is concerned that the higher dose of Wellbutrin may have caused her blood pressure to go up as it started trending up around the same time we will start tweaking her Wellbutrin.       Diabetes mellitus restarted on Metformin 500 mg daily with breakfast after A1c returned at 7.4% in December CBGs have been going up recently despite her trying to watch her diet.  She also states that her appetite has not been good things that she enjoyed eating she will often just want to spit out or her body just does not seem to tolerate and that upsets her.  She became tearful trying to describe these episodes.  She denies any pain with eating denies any burning sensation with reflux.  In general she just felt more depressed and sad.  On occasion feels anxiety.  Her sleep is fair she sleeps about 7 hours a night but still does not feel rested in the morning.  She cannot pinpoint 1 thing in particular that makes her feel depressed or sad states that there is nothing going on at home her routine is fairly the same.  She does not worry of the pandemic as she relies on her family to get her through.  But she just does not feel well in general.  She has noticed the past few weeks more depression despite being on the higher dose of  Wellbutrin.     Review Of Systems:  GEN- denies fatigue, fever, weight loss,weakness, recent illness HEENT- denies eye drainage, change in vision, nasal discharge, CVS- denies chest pain, palpitations RESP- denies SOB, cough, wheeze ABD- denies N/V, change in stools, abd pain GU- denies dysuria, hematuria, dribbling, incontinence MSK- denies joint pain, muscle aches, injury Neuro- denies headache, dizziness, syncope, seizure activity       Objective:    BP (!) 142/86   Pulse 82   Temp (!) 97.3 F (36.3 C) (Temporal)   Resp 18   Ht 5\' 3"  (1.6 m)   Wt 206 lb (93.4 kg)   SpO2 97%   BMI 36.49 kg/m  GEN- NAD, alert and oriented x3 HEENT- PERRL, EOMI, non injected sclera, pink conjunctiva, MMM, oropharynx clear Neck- Supple, no thyromegaly CVS- RRR, no murmur RESP-CTAB ABD-NABS,soft,NT,ND Psych tearful depressed appearing not anxious no suicidal ideations well-groomed good eye contact EXT- No edema Pulses- Radial, DP- 2+        Assessment & Plan:      Problem List Items Addressed This Visit      Unprioritized   Depression, recurrent ([place])    After further discussion she really is not noted any improvement in her mood with the higher dose of Wellbutrin and  concerned about side effects of the higher dose.  She will decrease her Wellbutrin back to 150 mg once a day.  We will see if that improves her blood pressure.  There is nothing particular found with regards to her blood pressure metabolically to help with her mood I recommend adding an adjunct medication at bedtime such as Lexapro.  She has other family members on this medication and tolerated.  We did discuss psychotherapy but she does not feel that she has anything to get counseling on at this time.      Relevant Medications   WELLBUTRIN XL 150 MG 24 hr tablet   Diabetes mellitus, type II (HCC)    Recheck A1c.  Goal is A1c less than 7%.      Relevant Orders   Hemoglobin A1c   Esophageal dysmotility     History of esophageal dysmotility.  But she is finding that she is having problems with some of her favorite foods but not necessarily pattern to the different foods that may cause some of the GI upset.      Hypertension - Primary    Blood pressure still mildly elevated today.  Will check metabolic panel TSH think this anything else, contributing to her increasing blood pressure.      Relevant Orders   CBC with Differential/Platelet   Comprehensive metabolic panel   TSH   Myasthenia gravis without acute exacerbation ([place])    She is concerned about possibility for myasthenia causing some of the issues as well.  She does have fatigue as well as muscle spasm and discomfort in various muscle groups.  She does not have any vision changes but she does notice some drooping of her right eyelid but this is also the side that she had Bell's palsy on.  She would like to have her antibodies rechecked.      Relevant Medications   WELLBUTRIN XL 150 MG 24 hr tablet   Other Relevant Orders   Acetylcholine receptor, binding   Sedimentation Rate   C-reactive protein    Other Visit Diagnoses    Fatigue, unspecified type          Note: This dictation was prepared with Dragon dictation along with smaller phrase technology. Any transcriptional errors that result from this process are unintentional.

## 2020-01-22 NOTE — Assessment & Plan Note (Signed)
After further discussion she really is not noted any improvement in her mood with the higher dose of Wellbutrin and concerned about side effects of the higher dose.  She will decrease her Wellbutrin back to 150 mg once a day.  We will see if that improves her blood pressure.  There is nothing particular found with regards to her blood pressure metabolically to help with her mood I recommend adding an adjunct medication at bedtime such as Lexapro.  She has other family members on this medication and tolerated.  We did discuss psychotherapy but she does not feel that she has anything to get counseling on at this time.

## 2020-01-22 NOTE — Assessment & Plan Note (Signed)
Recheck A1c.  Goal is A1c less than 7%.

## 2020-01-22 NOTE — Assessment & Plan Note (Signed)
History of esophageal dysmotility.  But she is finding that she is having problems with some of her favorite foods but not necessarily pattern to the different foods that may cause some of the GI upset.

## 2020-01-22 NOTE — Assessment & Plan Note (Signed)
Blood pressure still mildly elevated today.  Will check metabolic panel TSH think this anything else, contributing to her increasing blood pressure.

## 2020-01-22 NOTE — Patient Instructions (Addendum)
Decrease wellbutrin back to  150mg  once a day If labs are normal, I recommend adding lexapro 5mg  at bedtime Continue losartan 100mg  and Metoprolol 50mg  once a day F/U pending results

## 2020-01-22 NOTE — Assessment & Plan Note (Signed)
She is concerned about possibility for myasthenia causing some of the issues as well.  She does have fatigue as well as muscle spasm and discomfort in various muscle groups.  She does not have any vision changes but she does notice some drooping of her right eyelid but this is also the side that she had Bell's palsy on.  She would like to have her antibodies rechecked.

## 2020-01-23 LAB — COMPREHENSIVE METABOLIC PANEL
AG Ratio: 2.6 (calc) — ABNORMAL HIGH (ref 1.0–2.5)
ALT: 34 U/L — ABNORMAL HIGH (ref 6–29)
AST: 30 U/L (ref 10–35)
Albumin: 4.9 g/dL (ref 3.6–5.1)
Alkaline phosphatase (APISO): 71 U/L (ref 37–153)
BUN: 9 mg/dL (ref 7–25)
CO2: 23 mmol/L (ref 20–32)
Calcium: 9.7 mg/dL (ref 8.6–10.4)
Chloride: 104 mmol/L (ref 98–110)
Creat: 0.75 mg/dL (ref 0.50–0.99)
Globulin: 1.9 g/dL (calc) (ref 1.9–3.7)
Glucose, Bld: 162 mg/dL — ABNORMAL HIGH (ref 65–99)
Potassium: 4 mmol/L (ref 3.5–5.3)
Sodium: 140 mmol/L (ref 135–146)
Total Bilirubin: 0.7 mg/dL (ref 0.2–1.2)
Total Protein: 6.8 g/dL (ref 6.1–8.1)

## 2020-01-23 LAB — CBC WITH DIFFERENTIAL/PLATELET
Absolute Monocytes: 561 cells/uL (ref 200–950)
Basophils Absolute: 40 cells/uL (ref 0–200)
Basophils Relative: 0.6 %
Eosinophils Absolute: 370 cells/uL (ref 15–500)
Eosinophils Relative: 5.6 %
HCT: 44.8 % (ref 35.0–45.0)
Hemoglobin: 15.4 g/dL (ref 11.7–15.5)
Lymphs Abs: 1861 cells/uL (ref 850–3900)
MCH: 32.2 pg (ref 27.0–33.0)
MCHC: 34.4 g/dL (ref 32.0–36.0)
MCV: 93.7 fL (ref 80.0–100.0)
MPV: 10 fL (ref 7.5–12.5)
Monocytes Relative: 8.5 %
Neutro Abs: 3769 cells/uL (ref 1500–7800)
Neutrophils Relative %: 57.1 %
Platelets: 243 10*3/uL (ref 140–400)
RBC: 4.78 10*6/uL (ref 3.80–5.10)
RDW: 12.3 % (ref 11.0–15.0)
Total Lymphocyte: 28.2 %
WBC: 6.6 10*3/uL (ref 3.8–10.8)

## 2020-01-23 LAB — HEMOGLOBIN A1C
Hgb A1c MFr Bld: 6.8 % of total Hgb — ABNORMAL HIGH (ref <5.7)
Mean Plasma Glucose: 148 (calc)
eAG (mmol/L): 8.2 (calc)

## 2020-01-23 LAB — SEDIMENTATION RATE: Sed Rate: 2 mm/h (ref 0–30)

## 2020-01-23 LAB — C-REACTIVE PROTEIN: CRP: 1.6 mg/L (ref <8.0)

## 2020-01-23 LAB — TSH: TSH: 2.36 mIU/L (ref 0.40–4.50)

## 2020-01-24 ENCOUNTER — Other Ambulatory Visit: Payer: Self-pay | Admitting: *Deleted

## 2020-01-24 MED ORDER — ESCITALOPRAM OXALATE 5 MG PO TABS: 5.0000 mg | ORAL_TABLET | Freq: Every day | ORAL | 1 refills | Status: DC

## 2020-01-24 MED ORDER — METOPROLOL SUCCINATE ER 50 MG PO TB24: ORAL_TABLET | ORAL | 3 refills | Status: DC

## 2020-01-24 MED ORDER — LOSARTAN POTASSIUM 100 MG PO TABS: 100.0000 mg | ORAL_TABLET | Freq: Every day | ORAL | 3 refills | Status: DC

## 2020-01-29 ENCOUNTER — Encounter: Payer: Self-pay | Admitting: Family Medicine

## 2020-02-01 LAB — ACETYLCHOLINE RECEPTOR, BINDING: A CHR BINDING ABS: <0.3 nmol/L

## 2020-02-10 ENCOUNTER — Telehealth (INDEPENDENT_AMBULATORY_CARE_PROVIDER_SITE_OTHER): Payer: Medicare Other | Admitting: Family Medicine

## 2020-02-10 ENCOUNTER — Encounter: Payer: Self-pay | Admitting: Family Medicine

## 2020-02-10 DIAGNOSIS — I10 Essential (primary) hypertension: Secondary | ICD-10-CM

## 2020-02-10 DIAGNOSIS — F339 Major depressive disorder, recurrent, unspecified: Secondary | ICD-10-CM

## 2020-02-10 MED ORDER — WELLBUTRIN XL 150 MG PO TB24: ORAL_TABLET | ORAL | 3 refills | Status: DC

## 2020-02-10 NOTE — Progress Notes (Signed)
Virtual Visit via Video Note  I connected with Carolyn [name] on 02/10/20 at 2:56pm  by a video enabled telemedicine application and verified that I am speaking with the correct person using two identifiers.     Pt location: at home   Physician location:  In office, [facility], Vic Blackbird MD     On call: patient and physician   I discussed the limitations of evaluation and management by telemedicine and the availability of in person appointments. The patient expressed understanding and agreed to proceed.  History of Present Illness: Telehealth visit to follow-up medications in setting of COVID-19 Pandemic.  Our last visit was about 3 to 4 weeks ago.  Was concerned that the higher doses of Wellbutrin was causing adverse effects with her blood pressure was also not helping to control the mood very much.  At that time I reduce Wellbutrin back to 150 mg once a day.  Lexapro 5 mg was added to her at bedtime.  For blood pressure she was continued on metoprolol 50 mg and losartan 100 mg She has continued on Wellbutrin 300 mg to finish up that dose secondary to the cost.  This cost her about $600 a month.  She would like to go ahead and reduce down to 150mg  of Wellbutrin once a day as we discussed before she did not see any real improvement in her mood at  The higher dose.  She has noticed improvement with the addition of the Lexapro the past couple weeks.  Her sleep has improved and she feels less on edge and less sadness.  She does feel like it is making a difference.    Labs are reviewed prior to the visit as well A1c had improved and was at 6.8% he also had acetylcholine receptors done for previous history of myasthenia gravis but these came back normal.  I discussed referring back to neurology to help sort out if she does have this diagnosis but not since she is not having any current symptoms she would like to hold off at this time      Observations/Objective: NAD noted ,  well appearing  Psych- normal affect and mood, well groomed   Resp- Normal WOB   Assessment and Plan: Major depression disorder we will reduce Wellbutrin to 150 mg daily continue Lexapro at 5 mg at bedtime.  We will see how she does with this combination for the next few weeks.  She finds that her depression symptoms are increasing then the next that will be to increase the Lexapro to 10 mg at bedtime.  Hypertension she has been checking her blood pressure and this has improved.  States that her last blood pressure was 130's over 88 peer we will continue current regimen of metoprolol and losartan.  Adjustment in her depression medication and mood has improved her blood pressure.  Follow Up Instructions: F/U 2-3 months for meds     I discussed the assessment and treatment plan with the patient. The patient was provided an opportunity to ask questions and all were answered. The patient agreed with the plan and demonstrated an understanding of the instructions.   The patient was advised to call back or seek an in-person evaluation if the symptoms worsen or if the condition fails to improve as anticipated.  I provided  11  minutes of non-face-to-face time during this encounter. End Time 3:07pm   Vic Blackbird, MD

## 2020-02-18 DIAGNOSIS — H524 Presbyopia: Secondary | ICD-10-CM | POA: Diagnosis not present

## 2020-02-18 DIAGNOSIS — H52203 Unspecified astigmatism, bilateral: Secondary | ICD-10-CM | POA: Diagnosis not present

## 2020-02-18 DIAGNOSIS — H5213 Myopia, bilateral: Secondary | ICD-10-CM | POA: Diagnosis not present

## 2020-02-18 DIAGNOSIS — E1136 Type 2 diabetes mellitus with diabetic cataract: Secondary | ICD-10-CM | POA: Diagnosis not present

## 2020-02-18 DIAGNOSIS — H25813 Combined forms of age-related cataract, bilateral: Secondary | ICD-10-CM | POA: Diagnosis not present

## 2020-02-26 ENCOUNTER — Encounter: Payer: Self-pay | Admitting: Ophthalmology

## 2020-02-26 DIAGNOSIS — H538 Other visual disturbances: Secondary | ICD-10-CM | POA: Diagnosis not present

## 2020-03-19 ENCOUNTER — Other Ambulatory Visit: Payer: Self-pay

## 2020-03-19 ENCOUNTER — Encounter (INDEPENDENT_AMBULATORY_CARE_PROVIDER_SITE_OTHER): Payer: Self-pay | Admitting: Ophthalmology

## 2020-03-19 ENCOUNTER — Ambulatory Visit (INDEPENDENT_AMBULATORY_CARE_PROVIDER_SITE_OTHER): Payer: Medicare Other | Admitting: Ophthalmology

## 2020-03-19 DIAGNOSIS — H43812 Vitreous degeneration, left eye: Secondary | ICD-10-CM | POA: Diagnosis not present

## 2020-03-19 DIAGNOSIS — H47292 Other optic atrophy, left eye: Secondary | ICD-10-CM

## 2020-03-19 DIAGNOSIS — H35032 Hypertensive retinopathy, left eye: Secondary | ICD-10-CM | POA: Diagnosis not present

## 2020-03-19 DIAGNOSIS — E119 Type 2 diabetes mellitus without complications: Secondary | ICD-10-CM | POA: Diagnosis not present

## 2020-03-19 DIAGNOSIS — H43813 Vitreous degeneration, bilateral: Secondary | ICD-10-CM | POA: Insufficient documentation

## 2020-03-19 DIAGNOSIS — H2513 Age-related nuclear cataract, bilateral: Secondary | ICD-10-CM | POA: Diagnosis not present

## 2020-03-19 NOTE — Progress Notes (Signed)
03/19/2020     CHIEF COMPLAINT Patient presents for Flashes/floaters   HISTORY OF PRESENT ILLNESS: Carolyn [name] is a 69 y.o. female who presents to the clinic today for:   HPI    Flashes/floaters    In left eye.  This started 4 weeks ago.  Duration of 4 weeks.  Duration Intermittant.  Characterized as small and ring.  Since onset it is gradually worsening.  Associated Symptoms Flashes and Pain.  Context:  distance vision, near vision and mid-range vision.  Context: diabetes.  Treatments tried include no treatments.  Response to treatment was no improvement.  I, the attending physician,  performed the HPI with the patient and updated documentation appropriately.          Comments    Pt referred by Dr. [name] for seeing flashes of lights OS.  Pt states about 4 weeks ago she started seeing flashing half circles in outside quadrant of vision. Pt states it also looked like there was a veil in peripheral vision of OS. Pt states these were intermittent. Pt was seen by Dr. [name] about 3 weeks ago. Pt started having pain and achiness in OS starting last night. Pt states the pain was also intermittent. Pt states she notices the FOL and pain more when she bends over. BGL: 172 A1C: around 6.3       Last edited by Tilda Franco on 03/19/2020 10:14 AM. (History)      Referring physician: Rutherford Guys, [place],  [place] [id number]  HISTORICAL INFORMATION:   Selected notes from the MEDICAL RECORD NUMBER    Lab Results  Component Value Date   HGBA1C 6.8 (H) 01/22/2020     CURRENT MEDICATIONS: No current outpatient medications on file. (Ophthalmic Drugs)   No current facility-administered medications for this visit. (Ophthalmic Drugs)   Current Outpatient Medications (Other)  Medication Sig  . BIOTIN PO Take 2,500 mcg by mouth 2 (two) times daily.  . Blood Glucose Monitoring Suppl (BLOOD GLUCOSE SYSTEM PAK) KIT Use as directed to monitor FSBS 2x daily.  Dx: E11.9  . Cholecalciferol (VITAMIN D3) 1000 units CAPS Take by mouth daily.  Marland Kitchen escitalopram (LEXAPRO) 5 MG tablet Take 1 tablet (5 mg total) by mouth at bedtime.  Marland Kitchen losartan (COZAAR) 100 MG tablet Take 1 tablet (100 mg total) by mouth daily.  . metFORMIN (GLUCOPHAGE) 500 MG tablet Take 1 tablet (500 mg total) by mouth daily with breakfast.  . metoprolol succinate (TOPROL-XL) 50 MG 24 hr tablet TAKE 1 TABLET BY MOUTH DAILY  . Multiple Vitamins-Minerals (MULTI COMPLETE PO) Take by mouth daily.  . NON FORMULARY Take by mouth daily.  . NON FORMULARY Take by mouth daily.  . NON FORMULARY Take by mouth.  . NON FORMULARY Take by mouth daily.  Glory Rosebush VERIO test strip USE TO TEST BLOOD SUGAR TWICE DAILY  . Pitavastatin Calcium (LIVALO) 2 MG TABS Take 1 tablet (2 mg total) by mouth daily.  Marland Kitchen VASCEPA 1 g capsule TAKE 2 CAPSULES BY MOUTH TWICE DAILY WITH A MEAL  . WELLBUTRIN XL 150 MG 24 hr tablet TAKE 1 TABLET(150 MG) BY MOUTH DAILY   No current facility-administered medications for this visit. (Other)      REVIEW OF SYSTEMS: ROS    Positive for: Endocrine   Last edited by Tilda Franco on 03/19/2020 10:14 AM. (History)       ALLERGIES Allergies  Allergen Reactions  . Prozac [Fluoxetine Hcl] Anaphylaxis  .  Codeine Other (See Comments)    Intolerance   . Other     -mycin medications: has myasthenia gravis   . Sulfa Antibiotics Hives    PAST MEDICAL HISTORY Past Medical History:  Diagnosis Date  . Depression   . Diabetes mellitus without complication ([facility])   . Diastolic dysfunction   . Hyperlipidemia   . Myasthenia gravis ([facility])   . Patient is Jehovah's Witness   . Sleep apnea    Past Surgical History:  Procedure Laterality Date  . APPENDECTOMY    . BUNIONECTOMY    . MANDIBLE SURGERY    . TONSILECTOMY, ADENOIDECTOMY, BILATERAL MYRINGOTOMY AND TUBES      FAMILY HISTORY Family History  Problem Relation Age of Onset  . Arthritis Mother   . Depression Mother    . Diabetes Mother   . Hypertension Mother   . Alcohol abuse Father   . Hypertension Father   . Heart disease Father 21       Massive MI  . Diabetes Brother   . Diabetes Brother   . Heart disease Brother   . Diabetes Brother        Type I     SOCIAL HISTORY Social History   Tobacco Use  . Smoking status: Never Smoker  . Smokeless tobacco: Never Used  Substance Use Topics  . Alcohol use: Yes    Comment: socially  . Drug use: No         OPHTHALMIC EXAM:  Base Eye Exam    Visual Acuity    ETDRS       Tonometry (Tonopen, 10:20 AM)      Right Left   Pressure 9 10       Pupils      Pupils Dark Light Shape React APD   Right PERRL 5 3 Round Brisk None   Left PERRL 5 3 Round Brisk None       Visual Fields (Counting fingers)      Left Right    Full Full       Neuro/Psych    Oriented x3: Yes   Mood/Affect: Normal       Dilation    Both eyes: 1.0% Mydriacyl, 2.5% Phenylephrine @ 10:20 AM        Slit Lamp and Fundus Exam    External Exam      Right Left   External Normal Normal       Slit Lamp Exam      Right Left   Lids/Lashes Normal Normal   Conjunctiva/Sclera White and quiet White and quiet   Cornea Clear Clear   Anterior Chamber Deep and quiet Deep and quiet   Iris Round and reactive Round and reactive   Lens Nuclear sclerosis, Cortical cataract Nuclear sclerosis, Cortical cataract   Anterior Vitreous Normal Normal       Fundus Exam      Right Left   Posterior Vitreous Normal Posterior vitreous detachment   Disc Normal Normal   C/D Ratio 0.3-0.4 0.5   Macula Hard drusen, no hemorrhage, no macular thickening, no exudates Hard drusen, no hemorrhage, no macular thickening, no exudates   Vessels Normal,, , no DR Normal, no blockages, no retinal whitening, no DR   Periphery Normal Normal,            IMAGING AND PROCEDURES  Imaging and Procedures for 03/19/20  OCT, Retina - OU - Both Eyes       Right Eye Quality was good. Scan  locations  included subfoveal. Central Foveal Thickness: 254. Progression has no prior data. Findings include normal observations.   Left Eye Quality was good. Scan locations included subfoveal. Central Foveal Thickness: 249. Progression has no prior data.   Notes Microcystoid changes are noted temporal to the fovea which are of not of pathological nature in general.  With these findings due to me suggest possibility of MAC-TEL and I thus inquired about sleep apnea.  No active pathology.  There are no areas of retinal whitening nor apparent retinal ischemia.       Color Fundus Photography Optos - OU - Both Eyes       Right Eye Progression has no prior data. Disc findings include normal observations. Macula : normal observations. Vessels : normal observations. Periphery : normal observations.   Left Eye Progression has no prior data. Disc findings include normal observations, pallor, thinning of rim. Macula : normal observations. Vessels : normal observations. Periphery : normal observations.   Notes OS has very minor subtle temporal optic atrophy and thinning of the temporal rim.  There is a small adjacent Cilioretinal artery in this area.  This might be hypertension related to a small little area of my may be even a type of an anterior ischemic optic neuropathy nonarteritic.  I will asked the patient to continue with blood pressure monitoring in good control.  I would suggest the use of aspirin 325 mg once daily or at least once every other day                ASSESSMENT/PLAN:  Diabetes mellitus, type II ([place]) The patient has diabetes without any evidence of retinopathy. The patient advised to maintain good blood glucose control, excellent blood pressure control, and favorable levels of cholesterol, low density lipoprotein, and high density lipoproteins. Follow up in 1 year was recommended. Explained that fluctuations in visual acuity , or "out of focus", may result from large  variations of blood sugar control.      ICD-10-CM   1. 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 Posterior vitreous detachment of left eye  H43.812 OCT, Retina - OU - Both Eyes  3. Nuclear sclerotic cataract of both eyes  H25.13   4. Hypertensive retinopathy, grade 1, left  H35.032   5. Type 2 diabetes mellitus without complication, without long-term current use of insulin (HCC)  E11.9   6. 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  With these findings due to me suggest possibility of MAC-TEL and I thus inquired about sleep apnea.  2.OS has very minor subtle temporal optic atrophy and thinning of the temporal rim.  There is a small adjacent Cilioretinal artery in this area.  This might be hypertension related to a small little area of my may be even a type of an anterior ischemic optic neuropathy nonarteritic.  I will asked the patient to continue with blood pressure monitoring in good control.  I would suggest the use of aspirin 325 mg once daily or at least once every other day.  This small area of atrophy of the optic nerve is the only region that would fit with her symptom of a horizontal band of visual difficulty  3.  Will monitor this condition and look for new onset symptoms or progression.  Follow-up in 4 to 6  weeks.  Follow-up with her medical doctor for blood pressure assessment would also be appropriate.  4. Patient on CPAP for sleep apnea, continue.  Ophthalmic Meds Ordered this visit:  No orders of the defined types were placed in this encounter.      No follow-ups on file.  There are no Patient Instructions on file for this visit.   Explained the diagnoses, plan, and follow up with the patient and they expressed understanding.  Patient expressed understanding of the importance of  proper follow up care.   Clent Demark Serrina Minogue M.D. Diseases & Surgery of the Retina and Vitreous Retina & Diabetic [place] 03/19/20     Abbreviations: M myopia (nearsighted); A astigmatism; H hyperopia (farsighted); P presbyopia; Mrx spectacle prescription;  CTL contact lenses; OD right eye; OS left eye; OU both eyes  XT exotropia; ET esotropia; PEK punctate epithelial keratitis; PEE punctate epithelial erosions; DES dry eye syndrome; MGD meibomian gland dysfunction; ATs artificial tears; PFAT's preservative free artificial tears; [place] nuclear sclerotic cataract; PSC posterior subcapsular cataract; ERM epi-retinal membrane; PVD posterior vitreous detachment; RD retinal detachment; DM diabetes mellitus; DR diabetic retinopathy; NPDR non-proliferative diabetic retinopathy; PDR proliferative diabetic retinopathy; CSME clinically significant macular edema; DME diabetic macular edema; dbh dot blot hemorrhages; CWS cotton wool spot; POAG primary open angle glaucoma; C/D cup-to-disc ratio; HVF humphrey visual field; GVF goldmann visual field; OCT optical coherence tomography; IOP intraocular pressure; BRVO Branch retinal vein occlusion; CRVO central retinal vein occlusion; CRAO central retinal artery occlusion; BRAO branch retinal artery occlusion; RT retinal tear; SB scleral buckle; PPV pars plana vitrectomy; VH Vitreous hemorrhage; PRP panretinal laser photocoagulation; IVK intravitreal kenalog; VMT vitreomacular traction; MH Macular hole;  NVD neovascularization of the disc; NVE neovascularization elsewhere; AREDS age related eye disease study; ARMD age related macular degeneration; POAG primary open angle glaucoma; EBMD epithelial/anterior basement membrane dystrophy; ACIOL anterior chamber intraocular lens; IOL intraocular lens; PCIOL posterior chamber intraocular lens; Phaco/IOL phacoemulsification with intraocular lens placement; [place] photorefractive keratectomy; LASIK laser assisted in situ keratomileusis;  HTN hypertension; DM diabetes mellitus; COPD chronic obstructive pulmonary disease

## 2020-03-19 NOTE — Assessment & Plan Note (Signed)

## 2020-03-24 ENCOUNTER — Other Ambulatory Visit: Payer: Self-pay | Admitting: Family Medicine

## 2020-03-26 ENCOUNTER — Other Ambulatory Visit: Payer: Self-pay | Admitting: Family Medicine

## 2020-04-02 ENCOUNTER — Other Ambulatory Visit: Payer: Self-pay | Admitting: Family Medicine

## 2020-04-20 ENCOUNTER — Telehealth: Payer: Self-pay | Admitting: Family Medicine

## 2020-04-20 NOTE — Progress Notes (Signed)
  Chronic Care Management   Outreach Note  04/20/2020 Name: DAJSHA MASSARO MRN: [id number] DOB: [birth year]  Referred by: Alycia Rossetti, MD Reason for referral : No chief complaint on file.   An unsuccessful telephone outreach was attempted today. The patient was referred to the pharmacist for assistance with care management and care coordination.   Follow Up Plan:   [place]

## 2020-04-21 ENCOUNTER — Telehealth: Payer: Self-pay | Admitting: Family Medicine

## 2020-04-21 NOTE — Chronic Care Management (AMB) (Signed)
°  Chronic Care Management   Note  04/21/2020 Name: [name] MRN: [id number] DOB: 1951-04-29  Carolyn [name] is a 69 y.o. year old female who is a primary care patient of Ocost[place], Modena Nunnery, MD. I reached out to Carolyn [name] by phone today in response to a referral sent by [name]'s PCP, Buelah Manis, Modena Nunnery, MD.   [name] was given information about Chronic Care Management services today including:  1. CCM service includes personalized support from designated clinical staff supervised by her physician, including individualized plan of care and coordination with other care providers 2. 24/7 contact phone numbers for assistance for urgent and routine care needs. 3. Service will only be billed when office clinical staff spend 20 minutes or more in a month to coordinate care. 4. Only one practitioner may furnish and bill the service in a calendar month. 5. The patient may stop CCM services at any time (effective at the end of the month) by phone call to the office staff.   Patient agreed to services and verbal consent obtained.   Follow up plan:   [place]

## 2020-04-30 ENCOUNTER — Encounter (INDEPENDENT_AMBULATORY_CARE_PROVIDER_SITE_OTHER): Payer: Medicare Other | Admitting: Ophthalmology

## 2020-04-30 ENCOUNTER — Encounter (INDEPENDENT_AMBULATORY_CARE_PROVIDER_SITE_OTHER): Payer: Self-pay | Admitting: Ophthalmology

## 2020-04-30 ENCOUNTER — Other Ambulatory Visit: Payer: Self-pay

## 2020-04-30 ENCOUNTER — Ambulatory Visit (INDEPENDENT_AMBULATORY_CARE_PROVIDER_SITE_OTHER): Payer: Medicare Other | Admitting: Ophthalmology

## 2020-04-30 DIAGNOSIS — H35032 Hypertensive retinopathy, left eye: Secondary | ICD-10-CM

## 2020-04-30 DIAGNOSIS — H47292 Other optic atrophy, left eye: Secondary | ICD-10-CM | POA: Insufficient documentation

## 2020-04-30 DIAGNOSIS — H47012 Ischemic optic neuropathy, left eye: Secondary | ICD-10-CM | POA: Diagnosis not present

## 2020-04-30 MED ORDER — FLUORESCEIN SODIUM 10 % IV SOLN
500.0000 mg | INTRAVENOUS | Status: AC | PRN
  Administered 2020-04-30: 500 mg via INTRAVENOUS

## 2020-04-30 NOTE — Assessment & Plan Note (Signed)
Nonarteritic anterior ischemic optic neuropathy is the most likely cause of temporal optic pallor in the left eye in the absence of glaucoma. Some effect of sleep apnea albeit treated could be at play here.

## 2020-04-30 NOTE — Progress Notes (Signed)
04/30/2020     CHIEF COMPLAINT Patient presents for Retina Follow Up   HISTORY OF PRESENT ILLNESS: Carolyn [name] is a 69 y.o. female who presents to the clinic today for:   HPI    Retina Follow Up    Patient presents with  Other.  In both eyes.  Duration of 6 weeks.  Since onset it is stable.          Comments    6 week follow up - FP OU, Poss FFA L/R Patient denies change in vision and overall has no complaints.        Last edited by Gerda Diss on 04/30/2020  9:36 AM. (History)      Referring physician: Alycia Rossetti, MD [id number] [place],  [place] [id number]  HISTORICAL INFORMATION:   Selected notes from the MEDICAL RECORD NUMBER    Lab Results  Component Value Date   HGBA1C 6.8 (H) 01/22/2020     CURRENT MEDICATIONS: No current outpatient medications on file. (Ophthalmic Drugs)   No current facility-administered medications for this visit. (Ophthalmic Drugs)   Current Outpatient Medications (Other)  Medication Sig  . BIOTIN PO Take 2,500 mcg by mouth 2 (two) times daily.  . Blood Glucose Monitoring Suppl (BLOOD GLUCOSE SYSTEM PAK) KIT Use as directed to monitor FSBS 2x daily. Dx: E11.9  . Cholecalciferol (VITAMIN D3) 1000 units CAPS Take by mouth daily.  Marland Kitchen escitalopram (LEXAPRO) 5 MG tablet TAKE 1 TABLET(5 MG) BY MOUTH AT BEDTIME  . losartan (COZAAR) 100 MG tablet Take 1 tablet (100 mg total) by mouth daily.  Marland Kitchen losartan (COZAAR) 25 MG tablet TAKE 1 TABLET(25 MG) BY MOUTH DAILY  . metFORMIN (GLUCOPHAGE) 500 MG tablet Take 1 tablet (500 mg total) by mouth daily with breakfast.  . metoprolol succinate (TOPROL-XL) 50 MG 24 hr tablet TAKE 1 TABLET BY MOUTH DAILY  . Multiple Vitamins-Minerals (MULTI COMPLETE PO) Take by mouth daily.  . NON FORMULARY Take by mouth daily.  . NON FORMULARY Take by mouth daily.  . NON FORMULARY Take by mouth.  . NON FORMULARY Take by mouth daily.  Glory Rosebush VERIO test strip USE TO TEST BLOOD SUGAR TWICE DAILY  .  Pitavastatin Calcium (LIVALO) 2 MG TABS Take 1 tablet (2 mg total) by mouth daily.  Marland Kitchen VASCEPA 1 g capsule TAKE 2 CAPSULES BY MOUTH TWICE DAILY WITH A MEAL  . WELLBUTRIN XL 150 MG 24 hr tablet TAKE 1 TABLET(150 MG) BY MOUTH DAILY   No current facility-administered medications for this visit. (Other)      REVIEW OF SYSTEMS:    ALLERGIES Allergies  Allergen Reactions  . Prozac [Fluoxetine Hcl] Anaphylaxis  . Codeine Other (See Comments)    Intolerance   . Other     -mycin medications: has myasthenia gravis   . Sulfa Antibiotics Hives    PAST MEDICAL HISTORY Past Medical History:  Diagnosis Date  . Depression   . Diabetes mellitus without complication ([facility])   . Diastolic dysfunction   . Hyperlipidemia   . Myasthenia gravis ([facility])   . Patient is Jehovah's Witness   . Sleep apnea    Past Surgical History:  Procedure Laterality Date  . APPENDECTOMY    . BUNIONECTOMY    . MANDIBLE SURGERY    . TONSILECTOMY, ADENOIDECTOMY, BILATERAL MYRINGOTOMY AND TUBES      FAMILY HISTORY Family History  Problem Relation Age of Onset  . Arthritis Mother   . Depression Mother   .  Diabetes Mother   . Hypertension Mother   . Alcohol abuse Father   . Hypertension Father   . Heart disease Father 65       Massive MI  . Diabetes Brother   . Diabetes Brother   . Heart disease Brother   . Diabetes Brother        Type I     SOCIAL HISTORY Social History   Tobacco Use  . Smoking status: Never Smoker  . Smokeless tobacco: Never Used  Vaping Use  . Vaping Use: Never used  Substance Use Topics  . Alcohol use: Yes    Comment: socially  . Drug use: No         OPHTHALMIC EXAM:  Base Eye Exam    Visual Acuity (Snellen - Linear)      Right Left   Dist cc 20/60+2 20/25-2   Dist ph cc 20/40        Tonometry (Tonopen, 9:43 AM)      Right Left   Pressure 13 13       Pupils      Pupils Dark Light Shape React APD   Right PERRL 5 3 Round Brisk None   Left PERRL 5 3 Round  Brisk None       Visual Fields (Counting fingers)      Left Right    Full Full       Extraocular Movement      Right Left    Full Full       Neuro/Psych    Oriented x3: Yes   Mood/Affect: Normal       Dilation    Both eyes: 1.0% Mydriacyl, 2.5% Phenylephrine @ 9:44 AM        Slit Lamp and Fundus Exam    External Exam      Right Left   External Normal Normal       Slit Lamp Exam      Right Left   Lids/Lashes Normal Normal   Conjunctiva/Sclera White and quiet White and quiet   Cornea Clear Clear   Anterior Chamber Deep and quiet Deep and quiet   Iris Round and reactive Round and reactive   Vitreous Normal Normal          IMAGING AND PROCEDURES  Imaging and Procedures for 04/30/20  Color Fundus Photography Optos - OU - Both Eyes       Right Eye Progression has been stable. Disc findings include normal observations. Macula : normal observations. Vessels : normal observations. Periphery : normal observations.   Left Eye Progression has been stable. Disc findings include pallor, thinning of rim. Macula : normal observations. Vessels : normal observations. Periphery : normal observations.   Notes OS, no change since last visit, with temporal optic atrophy.       Fluorescein Angiography Optos (Transit OS)       Injection:  500 mg Fluorescein Sodium 10 % injection   NDC: [phone]   Route: Intravenous, Site: Right ArmRight Eye   Progression has no prior data. Mid/Late phase findings include normal observations.   Left Eye   Progression has no prior data. Early phase findings include normal observations. Mid/Late phase findings include normal observations. Choroidal neovascularization is not present.   Notes OS with normal optic nerve and macular perfusion.  There is no delayed perfusion.  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 Some effect of sleep apnea albeit treated could be at play here.  Hypertensive retinopathy, grade 1, left Blood pressure is reported to be improved but still running a little high.      ICD-10-CM   1. 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 Hypertensive retinopathy, grade 1, left  H35.032     1.  Ongoing mild symptoms of spot paracentral OS.  This is most likely resulting from the occurrence of optic atrophy in the temporal region of the left eye, in the region of the papillomacular bundle.  Differential diagnosis would include mild anterior ischemic optic neuropathy associated with her recent hypertension out-of-control, or this could also be sleep apnea induced but this is well treated at night.  Explained to the patient that no specific therapy is warranted since there is no retinal vascular abnormality on fluorescein angiography today. 2.  Enhanced and adequate control of blood pressure is her best protection against progression of anterior ischemic optic neuropathy, nonarteritic in the left or the fellow eye.  3.  Ophthalmic Meds Ordered this visit:  Meds ordered this encounter  Medications  . Fluorescein Sodium 10 % injection 500 mg       Return in about 4 months (around 08/31/2020) for DILATE OU, OCT.  There are no Patient Instructions on file for this visit.   Explained the diagnoses, plan, and follow up with the  patient and they expressed understanding.  Patient expressed understanding of the importance of proper follow up care.   Clent Demark Conner Muegge M.D. Diseases & Surgery of the Retina and Vitreous Retina & Diabetic [place] 04/30/20     Abbreviations: M myopia (nearsighted); A astigmatism; H hyperopia (farsighted); P presbyopia; Mrx spectacle prescription;  CTL contact lenses; OD right eye; OS left eye; OU both eyes  XT exotropia; ET esotropia; PEK punctate epithelial keratitis; PEE punctate epithelial erosions; DES dry eye syndrome; MGD meibomian gland dysfunction; ATs artificial tears; PFAT's preservative free artificial tears; [place] nuclear sclerotic cataract; PSC posterior subcapsular cataract; ERM epi-retinal membrane; PVD posterior vitreous detachment; RD retinal detachment; DM diabetes mellitus; DR diabetic retinopathy; NPDR non-proliferative diabetic retinopathy; PDR proliferative diabetic retinopathy; CSME clinically significant macular edema; DME diabetic macular edema; dbh dot blot hemorrhages; CWS cotton wool spot; POAG primary open angle glaucoma; C/D cup-to-disc ratio; HVF humphrey visual field; GVF goldmann visual field; OCT optical coherence tomography; IOP intraocular pressure; BRVO Branch retinal vein occlusion; CRVO central retinal vein occlusion; CRAO central retinal artery occlusion; BRAO branch retinal artery occlusion; RT retinal tear; SB scleral buckle; PPV pars plana vitrectomy; VH Vitreous hemorrhage; PRP panretinal laser photocoagulation; IVK intravitreal kenalog; VMT vitreomacular traction; MH Macular hole;  NVD neovascularization of the disc; NVE neovascularization elsewhere; AREDS age related eye disease study; ARMD age related macular degeneration; POAG primary open angle glaucoma; EBMD epithelial/anterior basement membrane dystrophy; ACIOL anterior chamber intraocular lens; IOL intraocular lens; PCIOL posterior chamber intraocular lens; Phaco/IOL phacoemulsification  with intraocular  lens placement; [place] photorefractive keratectomy; LASIK laser assisted in situ keratomileusis; HTN hypertension; DM diabetes mellitus; COPD chronic obstructive pulmonary disease

## 2020-04-30 NOTE — Assessment & Plan Note (Signed)
Blood pressure is reported to be improved but still running a little high.

## 2020-05-15 ENCOUNTER — Telehealth: Payer: Self-pay | Admitting: Family Medicine

## 2020-05-15 NOTE — Progress Notes (Signed)
Spoke with patient.  Rescheduled CCM appt to 06/19/2020 @ 11am due to provider scheduling conflict.  [place]

## 2020-05-26 ENCOUNTER — Other Ambulatory Visit: Payer: Self-pay | Admitting: Family Medicine

## 2020-06-01 ENCOUNTER — Telehealth: Payer: Self-pay | Admitting: Student

## 2020-06-01 NOTE — Telephone Encounter (Signed)
Pt complaining of sharp pains under her breast.   Please call [phone]   Thanks renee Former SK Pt Offered Pt appt 06-02-20/1:30 Pt declined appt stating she doesn't know whether it's that serious

## 2020-06-01 NOTE — Telephone Encounter (Signed)
Patient reports 1 month duration of sharp intermittent pain under her sternum.She states it is sore when she touches it. She has had a history of esophageal spasms and has not used PPI. She had a calcium score of 0 on cardiac CT and was made PRN by Dr.Koneswaran. She is going to touch base with her pcp.She declined apt at this time.

## 2020-06-16 ENCOUNTER — Ambulatory Visit (INDEPENDENT_AMBULATORY_CARE_PROVIDER_SITE_OTHER): Payer: Medicare Other | Admitting: Pulmonary Disease

## 2020-06-16 ENCOUNTER — Other Ambulatory Visit: Payer: Self-pay

## 2020-06-16 ENCOUNTER — Encounter: Payer: Self-pay | Admitting: Pulmonary Disease

## 2020-06-16 ENCOUNTER — Ambulatory Visit: Payer: Medicare Other

## 2020-06-16 VITALS — BP 138/82 | HR 66 | Temp 97.5°F | Ht 63.0 in | Wt 207.0 lb

## 2020-06-16 DIAGNOSIS — G4733 Obstructive sleep apnea (adult) (pediatric): Secondary | ICD-10-CM | POA: Diagnosis not present

## 2020-06-16 NOTE — Patient Instructions (Addendum)
Call in January 2022 to arrange for new CPAP machine  Follow up in March 2022

## 2020-06-16 NOTE — Progress Notes (Signed)
[facility]  Chief Complaint  Patient presents with  . Follow-up    sleep apnea    Constitutional:  BP 138/82 (BP Location: Left Arm, Cuff Size: Normal)   Pulse 66   Temp (!) 97.5 F (36.4 C) (Oral)   Ht _0  (1.6 m)   Wt 207 lb (93.9 kg)   SpO2 98% Comment: Room air  BMI 36.67 kg/m   Past Medical History:  Depression, DM, Diastolic CHF, HLD, Myasthenia gravis, Jehovah's witness  Summary:  Carolyn [name] is a 69 y.o. female with obstructive sleep apnea.  Subjective:   Uses CPAP nightly.  She will be due for a new machine in 2022.  Uses nasal pillows mask.  No issue with mask fit. Denies sinus congestion or dry mouth.  Physical Exam:   Appearance - well kempt   ENMT - no sinus tenderness, no oral exudate, no LAN, Mallampati 2 airway, no stridor  Respiratory - equal breath sounds bilaterally, no wheezing or rales  CV - s1s2 regular rate and rhythm, no murmurs  Ext - no clubbing, no edema  Skin - no rashes  Psych - normal mood and affect   Assessment/Plan:   Obstructive sleep apnea. - she is compliant with CPAP - she will bring copy of her download after she gets this printed at her DME - she will call in January 2022 to arrange for new CPAP machine   A total of  21 minutes spent addressing patient care issues on day of visit.  Follow up:  Patient Instructions  Call in January 2022 to arrange for new CPAP machine  Follow up in March 2022   Signature:  Chesley Mires, MD [place] Pager: [phone] 06/16/2020, 9:49 AM  Flow Sheet      Sleep tests:   HST 10/27/15 >> AHI 45  CPAP 01/21/18 to 02/19/18 >> used on 30 of 30 nights with average 5 hrs 57 min.  Average AHI 0.9 with CPAP 7 cm H2O.  Cardiac tests:   Echo 01/07/19 >> AHI 50 to 55%, mild LVH  Medications:   Allergies as of 06/16/2020      Reactions   Prozac [fluoxetine Hcl] Anaphylaxis   Codeine Other (See Comments)    Intolerance   Other    -mycin medications: has myasthenia gravis   Sulfa Antibiotics Hives      Medication List       Accurate as of June 16, 2020  9:49 AM. If you have any questions, ask your nurse or doctor.        BIOTIN PO Take 2,500 mcg by mouth 2 (two) times daily.   Blood Glucose System Pak Kit Use as directed to monitor FSBS 2x daily. Dx: E11.9   escitalopram 5 MG tablet Commonly known as: LEXAPRO TAKE 1 TABLET(5 MG) BY MOUTH AT BEDTIME   Livalo 2 MG Tabs Generic drug: Pitavastatin Calcium Take 1 tablet (2 mg total) by mouth daily.   losartan 100 MG tablet Commonly known as: COZAAR Take 1 tablet (100 mg total) by mouth daily. What changed: Another medication with the same name was removed. Continue taking this medication, and follow the directions you see here. Changed by: Chesley Mires, MD   metFORMIN 500 MG tablet Commonly known as: GLUCOPHAGE Take 1 tablet (500 mg total) by mouth daily with breakfast.   metoprolol succinate 50 MG 24 hr tablet Commonly known as: TOPROL-XL TAKE 1 TABLET BY MOUTH DAILY   MULTI COMPLETE  PO Take by mouth daily.   NON FORMULARY Take by mouth daily.   NON FORMULARY Take by mouth daily.   NON FORMULARY Take by mouth.   NON FORMULARY Take by mouth daily.   OneTouch Verio test strip Generic drug: glucose blood USE TO TEST BLOOD SUGAR TWICE DAILY   Vascepa 1 g capsule Generic drug: icosapent Ethyl TAKE 2 CAPSULES BY MOUTH TWICE DAILY WITH A MEAL   Vitamin D3 25 MCG (1000 UT) Caps Take by mouth daily.   Wellbutrin XL 150 MG 24 hr tablet Generic drug: buPROPion TAKE 1 TABLET(150 MG) BY MOUTH DAILY       Past Surgical History:  She  has a past surgical history that includes Tonsilectomy, adenoidectomy, bilateral myringotomy and tubes; Appendectomy; Mandible surgery; and Bunionectomy.  Family History:  Her family history includes Alcohol abuse in her father; Arthritis in her mother; Depression in her mother;  Diabetes in her brother, brother, brother, and mother; Heart disease in her brother; Heart disease (age of onset: 87) in her father; Hypertension in her father and mother.  Social History:  She  reports that she has never smoked. She has never used smokeless tobacco. She reports current alcohol use. She reports that she does not use drugs.

## 2020-06-17 NOTE — Chronic Care Management (AMB) (Signed)
Chronic Care Management Pharmacy  Name: Carolyn [name]  MRN: [id number] DOB: Apr 29, 1951  Chief Complaint/ HPI  Carolyn [name],  69 y.o. , female presents for their Initial CCM visit with the clinical pharmacist In office.  PCP : Alycia Rossetti, MD  Their chronic conditions include: Hypertension, Type II diabetes, depression, hyperlipidemia.  Office Visits: 01/22/2020 [facility]) -  Patient was having higher BP's so decreased Wellbutrin back to $Remov'150mg'OPVxJN$  XL daily  Added lexapro $RemoveBefor'5mg'LKTXmeGMkRaB$  hs  02/10/2020 [facility]) - video visit  Follow up, med changes were working well  Nothing changed at this visit  Medications: Outpatient Encounter Medications as of 06/19/2020  Medication Sig  . BIOTIN PO Take 2,500 mcg by mouth 2 (two) times daily.  . Blood Glucose Monitoring Suppl (BLOOD GLUCOSE SYSTEM PAK) KIT Use as directed to monitor FSBS 2x daily. Dx: E11.9  . Cholecalciferol (VITAMIN D3) 1000 units CAPS Take by mouth daily.  Marland Kitchen escitalopram (LEXAPRO) 5 MG tablet TAKE 1 TABLET(5 MG) BY MOUTH AT BEDTIME  . losartan (COZAAR) 100 MG tablet Take 1 tablet (100 mg total) by mouth daily.  . metFORMIN (GLUCOPHAGE) 500 MG tablet Take 1 tablet (500 mg total) by mouth daily with breakfast.  . metoprolol succinate (TOPROL-XL) 50 MG 24 hr tablet TAKE 1 TABLET BY MOUTH DAILY  . Multiple Vitamins-Minerals (MULTI COMPLETE PO) Take by mouth daily.  . NON FORMULARY Take by mouth daily.  . NON FORMULARY Take by mouth daily.  . NON FORMULARY Take by mouth.  . NON FORMULARY Take by mouth daily.  Glory Rosebush VERIO test strip USE TO TEST BLOOD SUGAR TWICE DAILY  . Pitavastatin Calcium (LIVALO) 2 MG TABS Take 1 tablet (2 mg total) by mouth daily.  . WELLBUTRIN XL 150 MG 24 hr tablet TAKE 1 TABLET(150 MG) BY MOUTH DAILY   No facility-administered encounter medications on file as of 06/19/2020.     Current Diagnosis/Assessment:   Emergency planning/management officer Strain: Low Risk   . Difficulty of Paying Living Expenses: Not  very hard    Goals Addressed            This Visit's Progress   . Pharmacy Care Plan:       CARE PLAN ENTRY (see longitudinal plan of care for additional care plan information)  Current Barriers:  . Chronic Disease Management support, education, and care coordination needs related to Hypertension, Hyperlipidemia, Diabetes, and Depression   Hypertension BP Readings from Last 3 Encounters:  06/16/20 138/82  01/22/20 (!) 142/86  04/22/19 122/64   . Pharmacist Clinical Goal(s): o Over the next 7 days, patient will work with PharmD and providers to maintain BP goal <130/80 . Current regimen:  o Losartan $RemoveBe'100mg'YTCRJyexp$  daily o Metoprolol Succinate XL $RemoveBefo'50mg'BxtloLgZSvX$  . Interventions: o Reviewed office Bps o Comprehensive medication review . Patient self care activities - Over the next 7 days, patient will: o Check BP periodically, document, and provide at future appointments o Ensure daily salt intake < 2300 mg/day  Hyperlipidemia Lab Results  Component Value Date/Time   LDLCALC 68 10/18/2019 08:07 AM   . Pharmacist Clinical Goal(s): o Over the next 7 days, patient will work with PharmD and providers to maintain LDL goal < 70 . Current regimen:  o Livalo $Remove'2mg'HMQlpSZ$  daily . Interventions: o Reviewed most recent lipid panel o Discussed dietary modifications to help with TG . Patient self care activities - Over the next 7 days, patient will: o Continue to focus on medication adherence by pill count o Focus  on diet and exercise to bring TG's closer to goal  Diabetes Lab Results  Component Value Date/Time   HGBA1C 6.8 (H) 01/22/2020 03:57 PM   HGBA1C 7.4 (H) 10/18/2019 08:07 AM   . Pharmacist Clinical Goal(s): o Over the next 7 days, patient will work with PharmD and providers to maintain A1c goal <7% . Current regimen:  o Metformin $RemoveBef'500mg'spUjoQekmG$  daily with breakfast . Interventions: o Reviewed home blood sugar readings o Reviewed last A1c o Recommended repeat A1c o Recommend twice daily monitoring  for one week o Counseled on diet and exercise modifications . Patient self care activities - Over the next 7 days, patient will: o Check blood sugar twice daily, document, and provide at future appointments o Contact provider with any episodes of hypoglycemia o Work on diet lower in carbohydrates and increase physical activity to 3 days weekly as tolerated  Depression . Pharmacist Clinical Goal(s) o Over the next 7 days, patient will work with PharmD and providers to optimize medication related to depression. . Current regimen:   Wellbutrin XL $RemoveBefor'150mg'UrMwFNVVeNXE$  daily  Lexapro $Remove'5mg'rINlwvq$  daily . Interventions: o Discussed current symptoms o Reviewed current medication adherence. . Patient self care activities - Over the next 7 days, patient will: o Monitor herself for withdrawal symptoms such as headache, nausea, etc. o Report any change in symptoms to providers   Initial goal documentation        Diabetes   Recent Relevant Labs: Lab Results  Component Value Date/Time   HGBA1C 6.8 (H) 01/22/2020 03:57 PM   HGBA1C 7.4 (H) 10/18/2019 08:07 AM   MICROALBUR 0.5 10/18/2019 08:07 AM   MICROALBUR 3.6 04/01/2019 09:04 AM     Checking BG: Daily  Recent FBG Readings: [phone]  Patient has failed these meds in past: none noted Patient is currently uncontrolled on the following medications:   Metformin $RemoveBe'500mg'XANCUGcol$  daily with breakfast  Last diabetic Foot exam:  Lab Results  Component Value Date/Time   HMDIABEYEEXA No Retinopathy 11/07/2018 12:00 AM    Last diabetic Eye exam: No results found for: HMDIABFOOTEX   Uncontrolled based on self reported fasting blood sugars.  States she has not been as active as she knows she should be as well as not as strict on her diet as she has in the past.  Only checking sugars once daily and was concerned about the high numbers.  Counseled on appropriate goals for fasting and post-prandial glucose.  Recommended increased monitoring for one week and work on diet and  exercise.  Recommend recheck A1c in a month or two.  Plan  Continue current medications, check blood sugars every morning and at night x 1 week, work on lifestyle mods.  F/u with PharmD in 1 week and make appointment for updated labs in a month or two.  Hypertension   Office blood pressures are  BP Readings from Last 3 Encounters:  06/16/20 138/82  01/22/20 (!) 142/86  04/22/19 122/64    Patient has failed these meds in the past: none noted  Patient checks BP at home infrequently  Patient home BP readings are ranging: no logs available Patient is currently controlled on the following medications:   Losartan $RemoveB'100mg'TZMrkmoX$  daily  Metoprolol Succinate XL $RemoveBefo'50mg'ryWjTWfHqja$   Pt reports blood pressure has much improved on lower dose of Wellbutrin.  Denies dizziness, headaches.  Compliant with all medications.  Metoprolol does make her feel sleepy, counseled that this should subside.  Plan  Continue current medications     and  Depression  Patient has failed these meds in past: Wellbutrin XL $RemoveBefor'300mg'FPnrpsUgPMKf$   Patient is currently controlled on the following medications:   Wellbutrin XL $RemoveBefor'150mg'ByVWDpFRkJmh$  daily  Lexapro $Remove'5mg'TYLdPxM$  daily  She reports her mood is much improved, she is only taking lexapro every other day currently.  Taking it daily caused her to not feel awake some mornings.  Counseled on potential for withdrawal symptoms taking it this way.  Plan  Continue current medications, follow up on withdrawal symptoms in one week.  Hyperlipidemia   LDL goal < 70  Lipid Panel     Component Value Date/Time   CHOL 139 10/18/2019 0807   TRIG 265 (H) 10/18/2019 0807   HDL 40 (L) 10/18/2019 0807   LDLCALC 68 10/18/2019 0807    Hepatic Function Latest Ref Rng & Units 01/22/2020 10/18/2019 04/22/2019  Total Protein 6.1 - 8.1 g/dL 6.8 7.0 6.7  AST 10 - 35 U/L 30 33 26  ALT 6 - 29 U/L 34(H) 35(H) 26  Total Bilirubin 0.2 - 1.2 mg/dL 0.7 0.8 0.9     The 10-year ASCVD risk score Mikey Bussing DC Jr., et al., 2013) is:  20.6%   Values used to calculate the score:     Age: 60 years     Sex: Female     Is Non-Hispanic African American: No     Diabetic: Yes     Tobacco smoker: No     Systolic Blood Pressure: 290 mmHg     Is BP treated: Yes     HDL Cholesterol: 40 mg/dL     Total Cholesterol: 139 mg/dL   Patient has failed these meds in past: none noted Patient is currently controlled on the following medications:  . Livalo $Remove'2mg'CXmMbsj$   Compliant with medication, denies myalgias.  Discussed elevated TG's.  Patient knows she needs to work on diet.  Counseled on dietary recommendations.  Recommend repeat lipid panel.  Plan  Continue current medications  Vaccines   Reviewed and discussed patient's vaccination history.    Immunization History  Administered Date(s) Administered  . Moderna SARS-COVID-2 Vaccination 04/14/2020, 05/12/2020    Plan  Recommended patient receive shingles, Tdap, pneumonia vaccine in office.   Medication Management   . Miscellaneous medications: o None . OTC's:  o Biotin o Vitamin D 3 . Patient currently uses [employer].  . Patient reports using no specific method to organize medications and promote adherence. . Patient denies missed doses of medication.   Beverly Milch, PharmD Clinical Pharmacist [place] [phone]

## 2020-06-18 ENCOUNTER — Other Ambulatory Visit: Payer: Self-pay | Admitting: *Deleted

## 2020-06-18 DIAGNOSIS — E119 Type 2 diabetes mellitus without complications: Secondary | ICD-10-CM

## 2020-06-18 DIAGNOSIS — I10 Essential (primary) hypertension: Secondary | ICD-10-CM

## 2020-06-18 DIAGNOSIS — G7000 Myasthenia gravis without (acute) exacerbation: Secondary | ICD-10-CM

## 2020-06-18 DIAGNOSIS — K224 Dyskinesia of esophagus: Secondary | ICD-10-CM

## 2020-06-18 DIAGNOSIS — Z780 Asymptomatic menopausal state: Secondary | ICD-10-CM

## 2020-06-18 DIAGNOSIS — I5189 Other ill-defined heart diseases: Secondary | ICD-10-CM

## 2020-06-18 DIAGNOSIS — F339 Major depressive disorder, recurrent, unspecified: Secondary | ICD-10-CM

## 2020-06-18 DIAGNOSIS — E782 Mixed hyperlipidemia: Secondary | ICD-10-CM

## 2020-06-18 DIAGNOSIS — R5383 Other fatigue: Secondary | ICD-10-CM

## 2020-06-19 ENCOUNTER — Ambulatory Visit: Payer: Medicare Other

## 2020-06-19 ENCOUNTER — Other Ambulatory Visit: Payer: Self-pay

## 2020-06-19 DIAGNOSIS — E119 Type 2 diabetes mellitus without complications: Secondary | ICD-10-CM

## 2020-06-19 DIAGNOSIS — F339 Major depressive disorder, recurrent, unspecified: Secondary | ICD-10-CM

## 2020-06-19 DIAGNOSIS — I10 Essential (primary) hypertension: Secondary | ICD-10-CM

## 2020-06-19 DIAGNOSIS — E782 Mixed hyperlipidemia: Secondary | ICD-10-CM

## 2020-06-19 NOTE — Patient Instructions (Addendum)
Visit Information Thank you for meeting with me today!  I look forward to working with you to help you meet all of your healthcare goals and answer any questions you may have.  Feel free to contact me anytime!  Goals Addressed            This Visit's Progress   . Pharmacy Care Plan:       CARE PLAN ENTRY (see longitudinal plan of care for additional care plan information)  Current Barriers:  . Chronic Disease Management support, education, and care coordination needs related to Hypertension, Hyperlipidemia, Diabetes, and Depression   Hypertension BP Readings from Last 3 Encounters:  06/16/20 138/82  01/22/20 (!) 142/86  04/22/19 122/64   . Pharmacist Clinical Goal(s): o Over the next 7 days, patient will work with PharmD and providers to maintain BP goal <130/80 . Current regimen:  o Losartan 100mg  daily o Metoprolol Succinate XL 50mg  . Interventions: o Reviewed office Bps o Comprehensive medication review . Patient self care activities - Over the next 7 days, patient will: o Check BP periodically, document, and provide at future appointments o Ensure daily salt intake < 2300 mg/day  Hyperlipidemia Lab Results  Component Value Date/Time   LDLCALC 68 10/18/2019 08:07 AM   . Pharmacist Clinical Goal(s): o Over the next 7 days, patient will work with PharmD and providers to maintain LDL goal < 70 . Current regimen:  o Livalo 2mg  daily . Interventions: o Reviewed most recent lipid panel o Discussed dietary modifications to help with TG . Patient self care activities - Over the next 7 days, patient will: o Continue to focus on medication adherence by pill count o Focus on diet and exercise to bring TG's closer to goal  Diabetes Lab Results  Component Value Date/Time   HGBA1C 6.8 (H) 01/22/2020 03:57 PM   HGBA1C 7.4 (H) 10/18/2019 08:07 AM   . Pharmacist Clinical Goal(s): o Over the next 7 days, patient will work with PharmD and providers to maintain A1c goal  <7% . Current regimen:  o Metformin 500mg  daily with breakfast . Interventions: o Reviewed home blood sugar readings o Reviewed last A1c o Recommended repeat A1c o Recommend twice daily monitoring for one week o Counseled on diet and exercise modifications . Patient self care activities - Over the next 7 days, patient will: o Check blood sugar twice daily, document, and provide at future appointments o Contact provider with any episodes of hypoglycemia o Work on diet lower in carbohydrates and increase physical activity to 3 days weekly as tolerated  Depression . Pharmacist Clinical Goal(s) o Over the next 7 days, patient will work with PharmD and providers to optimize medication related to depression. . Current regimen:   Wellbutrin XL 150mg  daily  Lexapro 5mg  daily . Interventions: o Discussed current symptoms o Reviewed current medication adherence. . Patient self care activities - Over the next 7 days, patient will: o Monitor herself for withdrawal symptoms such as headache, nausea, etc. o Report any change in symptoms to providers   Initial goal documentation        Ms. Gibbon was given information about Chronic Care Management services today including:  1. CCM service includes personalized support from designated clinical staff supervised by her physician, including individualized plan of care and coordination with other care providers 2. 24/7 contact phone numbers for assistance for urgent and routine care needs. 3. Standard insurance, coinsurance, copays and deductibles apply for chronic care management only during months in which  we provide at least 20 minutes of these services. Most insurances cover these services at 100%, however patients may be responsible for any copay, coinsurance and/or deductible if applicable. This service may help you avoid the need for more expensive face-to-face services. 4. Only one practitioner may furnish and bill the service in a  calendar month. 5. The patient may stop CCM services at any time (effective at the end of the month) by phone call to the office staff.  Patient agreed to services and verbal consent obtained.   The patient verbalized understanding of instructions provided today and agreed to receive a mailed copy of patient instruction and/or educational materials. Telephone follow up appointment with pharmacy team member scheduled for: 1 week  Beverly Milch, PharmD Clinical Pharmacist [facility] [phone]   Diabetes Mellitus and Nutrition, Adult When you have diabetes (diabetes mellitus), it is very important to have healthy eating habits because your blood sugar (glucose) levels are greatly affected by what you eat and drink. Eating healthy foods in the appropriate amounts, at about the same times every day, can help you:  Control your blood glucose.  Lower your risk of heart disease.  Improve your blood pressure.  Reach or maintain a healthy weight. Every person with diabetes is different, and each person has different needs for a meal plan. Your health care provider may recommend that you work with a diet and nutrition specialist (dietitian) to make a meal plan that is best for you. Your meal plan may vary depending on factors such as:  The calories you need.  The medicines you take.  Your weight.  Your blood glucose, blood pressure, and cholesterol levels.  Your activity level.  Other health conditions you have, such as heart or kidney disease. How do carbohydrates affect me? Carbohydrates, also called carbs, affect your blood glucose level more than any other type of food. Eating carbs naturally raises the amount of glucose in your blood. Carb counting is a method for keeping track of how many carbs you eat. Counting carbs is important to keep your blood glucose at a healthy level, especially if you use insulin or take certain oral diabetes medicines. It is  important to know how many carbs you can safely have in each meal. This is different for every person. Your dietitian can help you calculate how many carbs you should have at each meal and for each snack. Foods that contain carbs include:  Bread, cereal, rice, pasta, and crackers.  Potatoes and corn.  Peas, beans, and lentils.  Milk and yogurt.  Fruit and juice.  Desserts, such as cakes, cookies, ice cream, and candy. How does alcohol affect me? Alcohol can cause a sudden decrease in blood glucose (hypoglycemia), especially if you use insulin or take certain oral diabetes medicines. Hypoglycemia can be a life-threatening condition. Symptoms of hypoglycemia (sleepiness, dizziness, and confusion) are similar to symptoms of having too much alcohol. If your health care provider says that alcohol is safe for you, follow these guidelines:  Limit alcohol intake to no more than 1 drink per day for nonpregnant women and 2 drinks per day for men. One drink equals 12 oz of beer, 5 oz of wine, or 1 oz of hard liquor.  Do not drink on an empty stomach.  Keep yourself hydrated with water, diet soda, or unsweetened iced tea.  Keep in mind that regular soda, juice, and other mixers may contain a lot of sugar and must be counted as carbs.  What are tips for following this plan?  Reading food labels  Start by checking the serving size on the "Nutrition Facts" label of packaged foods and drinks. The amount of calories, carbs, fats, and other nutrients listed on the label is based on one serving of the item. Many items contain more than one serving per package.  Check the total grams (g) of carbs in one serving. You can calculate the number of servings of carbs in one serving by dividing the total carbs by 15. For example, if a food has 30 g of total carbs, it would be equal to 2 servings of carbs.  Check the number of grams (g) of saturated and trans fats in one serving. Choose foods that have low or  no amount of these fats.  Check the number of milligrams (mg) of salt (sodium) in one serving. Most people should limit total sodium intake to less than 2,300 mg per day.  Always check the nutrition information of foods labeled as "low-fat" or "nonfat". These foods may be higher in added sugar or refined carbs and should be avoided.  Talk to your dietitian to identify your daily goals for nutrients listed on the label. Shopping  Avoid buying canned, premade, or processed foods. These foods tend to be high in fat, sodium, and added sugar.  Shop around the outside edge of the grocery store. This includes fresh fruits and vegetables, bulk grains, fresh meats, and fresh dairy. Cooking  Use low-heat cooking methods, such as baking, instead of high-heat cooking methods like deep frying.  Cook using healthy oils, such as olive, canola, or sunflower oil.  Avoid cooking with butter, cream, or high-fat meats. Meal planning  Eat meals and snacks regularly, preferably at the same times every day. Avoid going long periods of time without eating.  Eat foods high in fiber, such as fresh fruits, vegetables, beans, and whole grains. Talk to your dietitian about how many servings of carbs you can eat at each meal.  Eat 4-6 ounces (oz) of lean protein each day, such as lean meat, chicken, fish, eggs, or tofu. One oz of lean protein is equal to: ? 1 oz of meat, chicken, or fish. ? 1 egg. ?  cup of tofu.  Eat some foods each day that contain healthy fats, such as avocado, nuts, seeds, and fish. Lifestyle  Check your blood glucose regularly.  Exercise regularly as told by your health care provider. This may include: ? 150 minutes of moderate-intensity or vigorous-intensity exercise each week. This could be brisk walking, biking, or water aerobics. ? Stretching and doing strength exercises, such as yoga or weightlifting, at least 2 times a week.  Take medicines as told by your health care  provider.  Do not use any products that contain nicotine or tobacco, such as cigarettes and e-cigarettes. If you need help quitting, ask your health care provider.  Work with a Social worker or diabetes educator to identify strategies to manage stress and any emotional and social challenges. Questions to ask a health care provider  Do I need to meet with a diabetes educator?  Do I need to meet with a dietitian?  What number can I call if I have questions?  When are the best times to check my blood glucose? Where to find more information:  American Diabetes Association: diabetes.org  Academy of Nutrition and Dietetics: www.eatright.[employer] of Diabetes and Digestive and Kidney Diseases (NIH): [employer] Summary  A healthy meal plan will help you  control your blood glucose and maintain a healthy lifestyle.  Working with a diet and nutrition specialist (dietitian) can help you make a meal plan that is best for you.  Keep in mind that carbohydrates (carbs) and alcohol have immediate effects on your blood glucose levels. It is important to count carbs and to use alcohol carefully. This information is not intended to replace advice given to you by your health care provider. Make sure you discuss any questions you have with your health care provider. Document Revised: 09/29/2017 Document Reviewed: 11/21/2016 Elsevier Patient Education  2020 [employer].

## 2020-06-23 ENCOUNTER — Telehealth: Payer: Self-pay | Admitting: Pulmonary Disease

## 2020-06-23 NOTE — Telephone Encounter (Signed)
CPAP 06/18/19 to 06/16/20 >> used on 364 of 365 nights with average 6 hrs 43 min.  Average AHI 0.8 with CPAP 7 cm H2O   Please let her know CPAP report shows good control of sleep apnea with current set up.

## 2020-06-23 NOTE — Telephone Encounter (Signed)
Tried calling the pt and there was no answer- LMTCB.  

## 2020-06-23 NOTE — Telephone Encounter (Signed)
Spoke with the pt and notified of results per Dr Halford Chessman and she verbalized understanding.

## 2020-06-29 NOTE — Chronic Care Management (AMB) (Signed)
Chronic Care Management   Follow Up Note   06/30/2020 Name: Carolyn [name] MRN: [id number] DOB: 04-04-1951  Referred by: Alycia Rossetti, MD Reason for referral : Chronic Care Management (Follow up call)   Carolyn [name] is a 69 y.o. year old female who is a primary care patient of [place], Modena Nunnery, MD. The CCM team was consulted for assistance with chronic disease management and care coordination needs.    Review of patient status, including review of consultants reports, relevant laboratory and other test results, and collaboration with appropriate care team members and the patient's provider was performed as part of comprehensive patient evaluation and provision of chronic care management services.    SDOH (Social Determinants of Health) assessments performed: No See Care Plan activities for detailed interventions related to [facility])     Outpatient Encounter Medications as of 06/30/2020  Medication Sig  . BIOTIN PO Take 2,500 mcg by mouth 2 (two) times daily.  . Blood Glucose Monitoring Suppl (BLOOD GLUCOSE SYSTEM PAK) KIT Use as directed to monitor FSBS 2x daily. Dx: E11.9  . Cholecalciferol (VITAMIN D3) 1000 units CAPS Take by mouth daily.  Marland Kitchen escitalopram (LEXAPRO) 5 MG tablet TAKE 1 TABLET(5 MG) BY MOUTH AT BEDTIME  . losartan (COZAAR) 100 MG tablet Take 1 tablet (100 mg total) by mouth daily.  . metFORMIN (GLUCOPHAGE) 500 MG tablet Take 1 tablet (500 mg total) by mouth daily with breakfast.  . metoprolol succinate (TOPROL-XL) 50 MG 24 hr tablet TAKE 1 TABLET BY MOUTH DAILY  . Multiple Vitamins-Minerals (MULTI COMPLETE PO) Take by mouth daily.  . NON FORMULARY Take by mouth daily.  . NON FORMULARY Take by mouth daily.  . NON FORMULARY Take by mouth.  . NON FORMULARY Take by mouth daily.  Glory Rosebush VERIO test strip USE TO TEST BLOOD SUGAR TWICE DAILY  . Pitavastatin Calcium (LIVALO) 2 MG TABS Take 1 tablet (2 mg total) by mouth daily.  . WELLBUTRIN XL 150 MG 24 hr tablet  TAKE 1 TABLET(150 MG) BY MOUTH DAILY   No facility-administered encounter medications on file as of 06/30/2020.      Goals Addressed            This Visit's Progress   . Pharmacy Care Plan:       CARE PLAN ENTRY (see longitudinal plan of care for additional care plan information)  Current Barriers:  . Chronic Disease Management support, education, and care coordination needs related to Hypertension, Hyperlipidemia, Diabetes, and Depression   Hypertension BP Readings from Last 3 Encounters:  06/16/20 138/82  01/22/20 (!) 142/86  04/22/19 122/64   . Pharmacist Clinical Goal(s): o Over the next 7 days, patient will work with PharmD and providers to maintain BP goal <130/80 . Current regimen:  o Losartan $RemoveBe'100mg'cRUHJHReQ$  daily o Metoprolol Succinate XL $RemoveBefo'50mg'bGNuUCMqvjD$  . Interventions: o Reviewed office Bps o Comprehensive medication review . Patient self care activities - Over the next 7 days, patient will: o Check BP periodically, document, and provide at future appointments o Ensure daily salt intake < 2300 mg/day  Hyperlipidemia Lab Results  Component Value Date/Time   LDLCALC 68 10/18/2019 08:07 AM   . Pharmacist Clinical Goal(s): o Over the next 7 days, patient will work with PharmD and providers to maintain LDL goal < 70 . Current regimen:  o Livalo $Remove'2mg'jfefpKV$  daily . Interventions: o Reviewed most recent lipid panel o Discussed dietary modifications to help with TG . Patient self care activities - Over the next  7 days, patient will: o Continue to focus on medication adherence by pill count o Focus on diet and exercise to bring TG's closer to goal  Diabetes Lab Results  Component Value Date/Time   HGBA1C 6.8 (H) 01/22/2020 03:57 PM   HGBA1C 7.4 (H) 10/18/2019 08:07 AM   . Pharmacist Clinical Goal(s): o Over the next 7 days, patient will work with PharmD and providers to maintain A1c goal <7% . Current regimen:  o Metformin $RemoveBef'500mg'rKuDpYomOD$  daily with breakfast . Interventions: o Reviewed  home blood sugar readings o Reviewed last A1c o Recommended repeat A1c o Recommend twice daily monitoring for one week o Counseled on diet and exercise modifications . Patient self care activities - Over the next 7 days, patient will: o Check blood sugar twice daily, document, and provide at future appointments o Contact provider with any episodes of hypoglycemia o Work on diet lower in carbohydrates and increase physical activity to 3 days weekly as tolerated o Schedule a visit with Dr. Buelah Manis for updated A1c  Depression . Pharmacist Clinical Goal(s) o Over the next 7 days, patient will work with PharmD and providers to optimize medication related to depression. . Current regimen:   Wellbutrin XL $RemoveBefor'150mg'bvaOvJHLsiJu$  daily  Lexapro $Remove'5mg'dCWSOlY$  daily . Interventions: o Discussed current symptoms o Reviewed current medication adherence. . Patient self care activities - Over the next 7 days, patient will: o Monitor herself for withdrawal symptoms such as headache, nausea, etc. o Report any change in symptoms to providers   Initial goal documentation       Diabetes   A1c goal <7%  Recent Relevant Labs: Lab Results  Component Value Date/Time   HGBA1C 6.8 (H) 01/22/2020 03:57 PM   HGBA1C 7.4 (H) 10/18/2019 08:07 AM   MICROALBUR 0.5 10/18/2019 08:07 AM   MICROALBUR 3.6 04/01/2019 09:04 AM    Last diabetic Eye exam:  Lab Results  Component Value Date/Time   HMDIABEYEEXA No Retinopathy 11/07/2018 12:00 AM    Last diabetic Foot exam: No results found for: HMDIABFOOTEX   Checking BG: Daily  Recent FBG Readings: 201, 190, 186  Recent HS BG readings: 187, 231, 248  Patient has failed these meds in past: none noted Patient is currently uncontrolled on the following medications: Marland Kitchen Metformin $RemoveBeforeDEI'500mg'icvnGzpGpAMcaFiW$  daily  Patient reports her back has been bothering her lately and she has not been able to focus on her diabetes.  Only checked twice daily for three days and numbers listed above.  Blood sugars  elevated considerably above goal.  Discussed need for updated labwork (A1c) and encouraged patient to make appointment with Dr. Buelah Manis for this ASAP.  Patient agreed to try again and monitor blood sugar twice daily for one week and I will check back in.  If patient remains elevated, may need to titrate dose of metformin until she regains control with diet and exercise. Plan  Continue current medications, monitor blood sugar twice daily for additional week, schedule appoint with Dr. Buelah Manis for updated A1c.  If A1c elevated at patient cannot control sugars with diet, recommend titrating dose of meformin depending on how elevated A1c is. Depression   Patient has failed these meds in past: none noted Patient is currently controlled on the following medications:  . Wellbutrin XL $RemoveBefor'150mg'wiNAucCeDsVB$  . Lexapro $RemoveB'5mg'gXiJvSXs$   Patient denies headaches as discussed previously.  Reports stable mood.  Plan  Continue current medications  Adherence/Care Gaps  Also utilized the [employer] bridge to analyze care gaps and review adherence data for STAR measures.  Care gaps: 3  No breast cancer screen  No AWV this year  BP > 140/90  Adherence Data 100% compliant with Metformin and Losartan with 0 days missed.  No yet under adherence measure for Livalo, has only been filled once this year.  Patient is due for updated lab work.  Beverly Milch, PharmD Clinical Pharmacist [facility] [phone]

## 2020-06-30 ENCOUNTER — Ambulatory Visit: Payer: Self-pay

## 2020-06-30 NOTE — Patient Instructions (Addendum)
Visit Information  Goals Addressed            This Visit's Progress   . Pharmacy Care Plan:       CARE PLAN ENTRY (see longitudinal plan of care for additional care plan information)  Current Barriers:  . Chronic Disease Management support, education, and care coordination needs related to Hypertension, Hyperlipidemia, Diabetes, and Depression   Hypertension BP Readings from Last 3 Encounters:  06/16/20 138/82  01/22/20 (!) 142/86  04/22/19 122/64   . Pharmacist Clinical Goal(s): o Over the next 7 days, patient will work with PharmD and providers to maintain BP goal <130/80 . Current regimen:  o Losartan 100mg  daily o Metoprolol Succinate XL 50mg  . Interventions: o Reviewed office Bps o Comprehensive medication review . Patient self care activities - Over the next 7 days, patient will: o Check BP periodically, document, and provide at future appointments o Ensure daily salt intake < 2300 mg/day  Hyperlipidemia Lab Results  Component Value Date/Time   LDLCALC 68 10/18/2019 08:07 AM   . Pharmacist Clinical Goal(s): o Over the next 7 days, patient will work with PharmD and providers to maintain LDL goal < 70 . Current regimen:  o Livalo 2mg  daily . Interventions: o Reviewed most recent lipid panel o Discussed dietary modifications to help with TG . Patient self care activities - Over the next 7 days, patient will: o Continue to focus on medication adherence by pill count o Focus on diet and exercise to bring TG's closer to goal  Diabetes Lab Results  Component Value Date/Time   HGBA1C 6.8 (H) 01/22/2020 03:57 PM   HGBA1C 7.4 (H) 10/18/2019 08:07 AM   . Pharmacist Clinical Goal(s): o Over the next 7 days, patient will work with PharmD and providers to maintain A1c goal <7% . Current regimen:  o Metformin 500mg  daily with breakfast . Interventions: o Reviewed home blood sugar readings o Reviewed last A1c o Recommended repeat A1c o Recommend twice daily  monitoring for one week o Counseled on diet and exercise modifications . Patient self care activities - Over the next 7 days, patient will: o Check blood sugar twice daily, document, and provide at future appointments o Contact provider with any episodes of hypoglycemia o Work on diet lower in carbohydrates and increase physical activity to 3 days weekly as tolerated o Schedule a visit with Dr. Buelah Manis for updated A1c  Depression . Pharmacist Clinical Goal(s) o Over the next 7 days, patient will work with PharmD and providers to optimize medication related to depression. . Current regimen:   Wellbutrin XL 150mg  daily  Lexapro 5mg  daily . Interventions: o Discussed current symptoms o Reviewed current medication adherence. . Patient self care activities - Over the next 7 days, patient will: o Monitor herself for withdrawal symptoms such as headache, nausea, etc. o Report any change in symptoms to providers   Initial goal documentation        The patient verbalized understanding of instructions provided today and agreed to receive a mailed copy of patient instruction and/or educational materials.  Telephone follow up appointment with pharmacy team member scheduled for: 1 week  Beverly Milch, PharmD Clinical Pharmacist [facility] [phone]   Diabetes Mellitus and Exercise Exercising regularly is important for your overall health, especially when you have diabetes (diabetes mellitus). Exercising is not only about losing weight. It has many other health benefits, such as increasing muscle strength and bone density and reducing body fat and stress. This leads  to improved fitness, flexibility, and endurance, all of which result in better overall health. Exercise has additional benefits for people with diabetes, including:  Reducing appetite.  Helping to lower and control blood glucose.  Lowering blood pressure.  Helping to control amounts of fatty  substances (lipids) in the blood, such as cholesterol and triglycerides.  Helping the body to respond better to insulin (improving insulin sensitivity).  Reducing how much insulin the body needs.  Decreasing the risk for heart disease by: ? Lowering cholesterol and triglyceride levels. ? Increasing the levels of good cholesterol. ? Lowering blood glucose levels. What is my activity plan? Your health care provider or certified diabetes educator can help you make a plan for the type and frequency of exercise (activity plan) that works for you. Make sure that you:  Do at least 150 minutes of moderate-intensity or vigorous-intensity exercise each week. This could be brisk walking, biking, or water aerobics. ? Do stretching and strength exercises, such as yoga or weightlifting, at least 2 times a week. ? Spread out your activity over at least 3 days of the week.  Get some form of physical activity every day. ? Do not go more than 2 days in a row without some kind of physical activity. ? Avoid being inactive for more than 30 minutes at a time. Take frequent breaks to walk or stretch.  Choose a type of exercise or activity that you enjoy, and set realistic goals.  Start slowly, and gradually increase the intensity of your exercise over time. What do I need to know about managing my diabetes?   Check your blood glucose before and after exercising. ? If your blood glucose is 240 mg/dL (13.3 mmol/L) or higher before you exercise, check your urine for ketones. If you have ketones in your urine, do not exercise until your blood glucose returns to normal. ? If your blood glucose is 100 mg/dL (5.6 mmol/L) or lower, eat a snack containing 15-20 grams of carbohydrate. Check your blood glucose 15 minutes after the snack to make sure that your level is above 100 mg/dL (5.6 mmol/L) before you start your exercise.  Know the symptoms of low blood glucose (hypoglycemia) and how to treat it. Your risk for  hypoglycemia increases during and after exercise. Common symptoms of hypoglycemia can include: ? Hunger. ? Anxiety. ? Sweating and feeling clammy. ? Confusion. ? Dizziness or feeling light-headed. ? Increased heart rate or palpitations. ? Blurry vision. ? Tingling or numbness around the mouth, lips, or tongue. ? Tremors or shakes. ? Irritability.  Keep a rapid-acting carbohydrate snack available before, during, and after exercise to help prevent or treat hypoglycemia.  Avoid injecting insulin into areas of the body that are going to be exercised. For example, avoid injecting insulin into: ? The arms, when playing tennis. ? The legs, when jogging.  Keep records of your exercise habits. Doing this can help you and your health care provider adjust your diabetes management plan as needed. Write down: ? Food that you eat before and after you exercise. ? Blood glucose levels before and after you exercise. ? The type and amount of exercise you have done. ? When your insulin is expected to peak, if you use insulin. Avoid exercising at times when your insulin is peaking.  When you start a new exercise or activity, work with your health care provider to make sure the activity is safe for you, and to adjust your insulin, medicines, or food intake as needed.  Drink  plenty of water while you exercise to prevent dehydration or heat stroke. Drink enough fluid to keep your urine clear or pale yellow. Summary  Exercising regularly is important for your overall health, especially when you have diabetes (diabetes mellitus).  Exercising has many health benefits, such as increasing muscle strength and bone density and reducing body fat and stress.  Your health care provider or certified diabetes educator can help you make a plan for the type and frequency of exercise (activity plan) that works for you.  When you start a new exercise or activity, work with your health care provider to make sure the  activity is safe for you, and to adjust your insulin, medicines, or food intake as needed. This information is not intended to replace advice given to you by your health care provider. Make sure you discuss any questions you have with your health care provider. Document Revised: 05/11/2017 Document Reviewed: 03/28/2016 Elsevier Patient Education  [place].

## 2020-07-20 ENCOUNTER — Other Ambulatory Visit: Payer: Self-pay | Admitting: Family Medicine

## 2020-08-04 ENCOUNTER — Other Ambulatory Visit: Payer: Self-pay | Admitting: Family Medicine

## 2020-08-12 DIAGNOSIS — G4733 Obstructive sleep apnea (adult) (pediatric): Secondary | ICD-10-CM | POA: Diagnosis not present

## 2020-08-24 ENCOUNTER — Ambulatory Visit (INDEPENDENT_AMBULATORY_CARE_PROVIDER_SITE_OTHER): Payer: Medicare Other | Admitting: Family Medicine

## 2020-08-24 ENCOUNTER — Encounter: Payer: Self-pay | Admitting: Family Medicine

## 2020-08-24 ENCOUNTER — Other Ambulatory Visit: Payer: Self-pay

## 2020-08-24 VITALS — BP 140/90 | HR 67 | Temp 98.1°F | Ht 63.0 in | Wt 203.6 lb

## 2020-08-24 DIAGNOSIS — R110 Nausea: Secondary | ICD-10-CM | POA: Diagnosis not present

## 2020-08-24 DIAGNOSIS — E1169 Type 2 diabetes mellitus with other specified complication: Secondary | ICD-10-CM | POA: Diagnosis not present

## 2020-08-24 DIAGNOSIS — M545 Low back pain, unspecified: Secondary | ICD-10-CM | POA: Diagnosis not present

## 2020-08-24 DIAGNOSIS — E782 Mixed hyperlipidemia: Secondary | ICD-10-CM | POA: Diagnosis not present

## 2020-08-24 DIAGNOSIS — Z6836 Body mass index (BMI) 36.0-36.9, adult: Secondary | ICD-10-CM

## 2020-08-24 DIAGNOSIS — R197 Diarrhea, unspecified: Secondary | ICD-10-CM

## 2020-08-24 DIAGNOSIS — I10 Essential (primary) hypertension: Secondary | ICD-10-CM | POA: Diagnosis not present

## 2020-08-24 LAB — URINALYSIS, ROUTINE W REFLEX MICROSCOPIC
Bacteria, UA: NONE SEEN /HPF
Bilirubin Urine: NEGATIVE
Glucose, UA: NEGATIVE
Hyaline Cast: NONE SEEN /LPF
Ketones, ur: NEGATIVE
Leukocytes,Ua: NEGATIVE
Nitrite: NEGATIVE
Protein, ur: NEGATIVE
Specific Gravity, Urine: 1.01 (ref 1.001–1.03)
pH: 6.5 (ref 5.0–8.0)

## 2020-08-24 LAB — MICROSCOPIC MESSAGE

## 2020-08-24 MED ORDER — MELOXICAM 7.5 MG PO TABS
7.5000 mg | ORAL_TABLET | Freq: Every day | ORAL | 0 refills | Status: DC
Start: 2020-08-24 — End: 2020-11-11

## 2020-08-24 MED ORDER — TIZANIDINE HCL 4 MG PO TABS
4.0000 mg | ORAL_TABLET | Freq: Four times a day (QID) | ORAL | 0 refills | Status: DC | PRN
Start: 2020-08-24 — End: 2020-09-08

## 2020-08-24 NOTE — Assessment & Plan Note (Addendum)
Check A1C and labs, adjust meds as needed , may need to D/C MTF due to diarrhea episodes

## 2020-08-24 NOTE — Progress Notes (Signed)
Subjective:    Patient ID: Carolyn [name], female    DOB: 1951/10/01, 69 y.o.   MRN: [id number]  Patient presents for Back Pain (Pt stated that she has been experiencing some back pain for the past week. She did not do anything to hurt it that she can remember)  Pt here with left mid  To lower back pain for the past week , she woke up with the back pain. No radiation to LE  No particular injury. She has tried ice and heating pad, she used muscle rub as well but nothing has really helped.She does have chronic back pain, but often treats at home.  No Dysuria, no blood in urine She has had some nausea with eating and some diarrhea as well in the evenings This has been intermitant for the past few weeks She has noticed some food intolerances   DM last A1C 6.8% in March, due for labs, she has been taking metformin   recently 160-170's in the pastmonth, prior to that in 200's , admits she was not eating correctly and when she started having the GI upset started to improve her diet   Hyperlipidemia taking livalo a few times a week, often forgets to take I t  Review Of Systems:  GEN- denies fatigue, fever, weight loss,weakness, recent illness HEENT- denies eye drainage, change in vision, nasal discharge, CVS- denies chest pain, palpitations RESP- denies SOB, cough, wheeze ABD- denies N/V, change in stools, abd pain GU- denies dysuria, hematuria, dribbling, incontinence MSK- +joint pain, muscle aches, injury Neuro- denies headache, dizziness, syncope, seizure activity       Objective:    BP 140/90 (BP Location: Right Arm, Patient Position: Standing, Cuff Size: Large)   Pulse 67   Temp 98.1 F (36.7 C) (Oral)   Ht 5\' 3"  (1.6 m)   Wt 203 lb 9.6 oz (92.4 kg)   SpO2 98%   BMI 36.07 kg/m  GEN- NAD, alert and oriented x3 , pt standing, uncomfortable appearing  CVS- RRR, no murmur RESP-CTAB ABD-NABS,soft,mild TTP epigastric, no rebound, no guarding, no CVA tenderness MSK- decreased  ROM spine, TTP left lumbar and throraic paraspinals, antalgic gait  Neuro- normal tone LE, sensation in tact, strength grossly in tact  EXT- No edema Pulses- Radial, DP- 2+        Assessment & Plan:      Problem List Items Addressed This Visit      Unprioritized   Diabetes mellitus, type II (HCC)    Check A1C and labs, adjust meds as needed , may need to D/C MTF due to diarrhea episodes       Relevant Orders   Comprehensive metabolic panel   CBC with Differential/Platelet   Hemoglobin A1c   Lipid panel   Microalbumin / creatinine urine ratio   Hyperlipidemia   Relevant Orders   Lipid panel   Hypertension    Elevated in setting of pain, no change to meds today       Obesity    Other Visit Diagnoses    Acute left-sided low back pain without sciatica    -  Primary   Check UA with other symptoms, exam more consistent with MSK pain, zanaflex, mobic given   Relevant Medications   tiZANidine (ZANAFLEX) 4 MG tablet   meloxicam (MOBIC) 7.5 MG tablet   Other Relevant Orders   Urinalysis, Routine w reflex microscopic (Completed)   Diarrhea, unspecified type       unclear cause, check labs ,  she is avoiding foods that trigger, also concern sugar may be quite elevated    Nausea       Relevant Orders   Lipase      Note: This dictation was prepared with Dragon dictation along with smaller phrase technology. Any transcriptional errors that result from this process are unintentional.

## 2020-08-24 NOTE — Assessment & Plan Note (Signed)
Elevated in setting of pain, no change to meds today

## 2020-08-25 LAB — HEMOGLOBIN A1C
Hgb A1c MFr Bld: 6.8 % of total Hgb — ABNORMAL HIGH (ref <5.7)
Mean Plasma Glucose: 148 (calc)
eAG (mmol/L): 8.2 (calc)

## 2020-08-25 LAB — CBC WITH DIFFERENTIAL/PLATELET
Absolute Monocytes: 558 cells/uL (ref 200–950)
Basophils Absolute: 107 cells/uL (ref 0–200)
Basophils Relative: 1.3 %
Eosinophils Absolute: 1320 cells/uL — ABNORMAL HIGH (ref 15–500)
Eosinophils Relative: 16.1 %
HCT: 45.6 % — ABNORMAL HIGH (ref 35.0–45.0)
Hemoglobin: 15.3 g/dL (ref 11.7–15.5)
Lymphs Abs: 2312 cells/uL (ref 850–3900)
MCH: 31.7 pg (ref 27.0–33.0)
MCHC: 33.6 g/dL (ref 32.0–36.0)
MCV: 94.4 fL (ref 80.0–100.0)
MPV: 9.4 fL (ref 7.5–12.5)
Monocytes Relative: 6.8 %
Neutro Abs: 3903 cells/uL (ref 1500–7800)
Neutrophils Relative %: 47.6 %
Platelets: 244 10*3/uL (ref 140–400)
RBC: 4.83 10*6/uL (ref 3.80–5.10)
RDW: 12.4 % (ref 11.0–15.0)
Total Lymphocyte: 28.2 %
WBC: 8.2 10*3/uL (ref 3.8–10.8)

## 2020-08-25 LAB — COMPREHENSIVE METABOLIC PANEL
AG Ratio: 1.9 (calc) (ref 1.0–2.5)
ALT: 29 U/L (ref 6–29)
AST: 31 U/L (ref 10–35)
Albumin: 4.6 g/dL (ref 3.6–5.1)
Alkaline phosphatase (APISO): 75 U/L (ref 37–153)
BUN: 11 mg/dL (ref 7–25)
CO2: 25 mmol/L (ref 20–32)
Calcium: 9.9 mg/dL (ref 8.6–10.4)
Chloride: 104 mmol/L (ref 98–110)
Creat: 0.77 mg/dL (ref 0.50–0.99)
Globulin: 2.4 g/dL (calc) (ref 1.9–3.7)
Glucose, Bld: 165 mg/dL — ABNORMAL HIGH (ref 65–99)
Potassium: 4.9 mmol/L (ref 3.5–5.3)
Sodium: 142 mmol/L (ref 135–146)
Total Bilirubin: 0.8 mg/dL (ref 0.2–1.2)
Total Protein: 7 g/dL (ref 6.1–8.1)

## 2020-08-25 LAB — LIPID PANEL
Cholesterol: 181 mg/dL (ref <200)
HDL: 40 mg/dL — ABNORMAL LOW (ref >=50)
LDL Cholesterol (Calc): 107 mg/dL (calc) — ABNORMAL HIGH
Non-HDL Cholesterol (Calc): 141 mg/dL (calc) — ABNORMAL HIGH (ref <130)
Total CHOL/HDL Ratio: 4.5 (calc) (ref <5.0)
Triglycerides: 227 mg/dL — ABNORMAL HIGH (ref <150)

## 2020-08-25 LAB — MICROALBUMIN / CREATININE URINE RATIO
Creatinine, Urine: 41 mg/dL (ref 20–275)
Microalb Creat Ratio: 20 mcg/mg creat (ref <30)
Microalb, Ur: 0.8 mg/dL

## 2020-08-25 LAB — LIPASE: Lipase: 34 U/L (ref 7–60)

## 2020-08-28 ENCOUNTER — Encounter: Payer: Self-pay | Admitting: Family Medicine

## 2020-08-28 ENCOUNTER — Telehealth: Payer: Self-pay | Admitting: Pharmacist

## 2020-08-28 NOTE — Progress Notes (Signed)
Chronic Care Management Pharmacy Assistant   Name: Carolyn [name]  MRN: [id number] DOB: 22-Nov-1950  Reason for Encounter: Disease State  Patient Questions:  1.  Have you seen any other providers since your last visit? Yes  2.  Any changes in your medicines or health? Yes    PCP : Alycia Rossetti, MD   Their chronic conditions include: Hypertension, Type II diabetes, depression, hyperlipidemia.  Office Visits: 08-24-2020: (PCP) Patient presented in the office c/o lower back pain for the past week. Patient does not recall initiating an injury. New medications indicated: Mobic 7.5 mg, Zanaflex 4 mg.  Allergies:   Allergies  Allergen Reactions  . Prozac [Fluoxetine Hcl] Anaphylaxis  . Codeine Other (See Comments)    Intolerance   . Other     -mycin medications: has myasthenia gravis   . Sulfa Antibiotics Hives    Medications: Outpatient Encounter Medications as of 08/28/2020  Medication Sig  . BIOTIN PO Take 2,500 mcg by mouth 2 (two) times daily.  . Blood Glucose Monitoring Suppl (BLOOD GLUCOSE SYSTEM PAK) KIT Use as directed to monitor FSBS 2x daily. Dx: E11.9  . Cholecalciferol (VITAMIN D3) 1000 units CAPS Take by mouth daily.  Marland Kitchen escitalopram (LEXAPRO) 5 MG tablet TAKE 1 TABLET(5 MG) BY MOUTH AT BEDTIME  . icosapent Ethyl (VASCEPA) 1 g capsule TAKE 2 CAPSULES BY MOUTH TWICE DAILY WITH A MEAL  . losartan (COZAAR) 100 MG tablet Take 1 tablet (100 mg total) by mouth daily.  . meloxicam (MOBIC) 7.5 MG tablet Take 1 tablet (7.5 mg total) by mouth daily.  . metFORMIN (GLUCOPHAGE) 500 MG tablet Take 1 tablet (500 mg total) by mouth daily with breakfast.  . metoprolol succinate (TOPROL-XL) 50 MG 24 hr tablet TAKE 1 TABLET BY MOUTH DAILY  . Multiple Vitamins-Minerals (MULTI COMPLETE PO) Take by mouth daily.  . NON FORMULARY Take by mouth daily.  . NON FORMULARY Take by mouth daily.  . NON FORMULARY Take by mouth.  . NON FORMULARY Take by mouth daily.  Glory Rosebush VERIO  test strip USE TO TEST BLOOD SUGAR TWICE DAILY  . Pitavastatin Calcium (LIVALO) 2 MG TABS Take 1 tablet (2 mg total) by mouth daily.  Marland Kitchen tiZANidine (ZANAFLEX) 4 MG tablet Take 1 tablet (4 mg total) by mouth every 6 (six) hours as needed for muscle spasms.  . WELLBUTRIN XL 150 MG 24 hr tablet TAKE 1 TABLET(150 MG) BY MOUTH DAILY   No facility-administered encounter medications on file as of 08/28/2020.    Current Diagnosis: Patient Active Problem List   Diagnosis Date Noted  . 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 Posterior vitreous detachment of left eye 03/19/2020  . Nuclear sclerotic cataract of both eyes 03/19/2020  . Hypertensive retinopathy, grade 1, left 03/19/2020  . Posterior vitreous detachment of both eyes 03/19/2020  . Esophageal dysmotility 02/07/2018  . Diabetes mellitus, type II ([place]) 10/30/2017  . Hypertension 10/30/2017  . Diastolic dysfunction 53/00/5110  . Depression, recurrent ([place]) 10/30/2017  . Obesity 10/30/2017  . Hyperlipidemia 10/30/2017  . Sleep apnea 10/30/2017  . Multiple nevi 10/30/2017  . Myasthenia gravis without acute exacerbation ([place]) 10/30/2017  . Patient is Jehovah's Witness 10/30/2017    Goals Addressed   None    Reviewed chart prior to disease state call. Spoke with patient regarding BP  Recent Office Vitals: BP Readings from Last 3 Encounters:  08/24/20 140/90  06/16/20 138/82  01/22/20 (!) 142/86   Pulse Readings from Last 3 Encounters:  08/24/20 67  06/16/20 66  01/22/20 82    Wt Readings from Last 3 Encounters:  08/24/20 203 lb 9.6 oz (92.4 kg)  06/16/20 207 lb (93.9 kg)  01/22/20 206 lb (93.4 kg)     Kidney Function Lab Results  Component Value Date/Time   CREATININE 0.77 08/24/2020 11:34 AM   CREATININE 0.75 01/22/2020 03:57 PM   GFRNONAA >60 02/25/2019 11:41 AM   GFRAA >60 02/25/2019 11:41 AM    BMP Latest Ref Rng & Units  08/24/2020 01/22/2020 10/18/2019  Glucose 65 - 99 mg/dL 165(H) 162(H) 173(H)  BUN 7 - 25 mg/dL _0 Creatinine 0.50 - 0.99 mg/dL 0.77 0.75 0.86  BUN/Creat Ratio 6 - 22 (calc) NOT APPLICABLE NOT APPLICABLE NOT APPLICABLE  Sodium 650 - 146 mmol/L 142 140 141  Potassium 3.5 - 5.3 mmol/L 4.9 4.0 4.5  Chloride 98 - 110 mmol/L 104 104 104  CO2 20 - 32 mmol/L _1 Calcium 8.6 - 10.4 mg/dL 9.9 9.7 9.8    . Current antihypertensive regimen:  ? Losartan 100 mg daily ? Metoprolol Succinate XL 50 mg  . How often are you checking your Blood Pressure? weekly   . Current home BP readings: 123/91 is the last reading she has recorded on 08-16-2020  . What recent interventions/DTPs have been made by any provider to improve Blood Pressure control since last CPP Visit: none  . Any recent hospitalizations or ED visits since last visit with CPP? No   . What diet changes have been made to improve Blood Pressure Control?  o None at this time  . What exercise is being done to improve your Blood Pressure Control?  o Patient states she use to walk a couple times a week but has not been able to lately because of back pain  During the disease state call I inquired how her back felt since her OV on 08-24-2020. She stated she felt better. She has stopped taking the Tizanidine because she did not like the way it made her feel.  Adherence Review: Is the patient currently on ACE/ARB medication? Yes Losartan 100 mg Does the patient have >5 day gap between last estimated fill dates? No CPP please confirm   Follow-Up:  Pharmacist Review and Scheduled Follow-Up With Clinical Pharmacist Tuesday December 14th at 2:30pm over the telephone.   Fanny Skates, [place] Pharmacist Assistant [phone]

## 2020-08-31 ENCOUNTER — Encounter (INDEPENDENT_AMBULATORY_CARE_PROVIDER_SITE_OTHER): Payer: Medicare Other | Admitting: Ophthalmology

## 2020-09-01 ENCOUNTER — Other Ambulatory Visit: Payer: Self-pay | Admitting: *Deleted

## 2020-09-01 ENCOUNTER — Encounter: Payer: Self-pay | Admitting: Family Medicine

## 2020-09-01 ENCOUNTER — Ambulatory Visit
Admission: RE | Admit: 2020-09-01 | Discharge: 2020-09-01 | Disposition: A | Discharge status: Home / Self Care | Payer: Medicare Other | Source: Ambulatory Visit | Attending: Family Medicine | Admitting: Family Medicine

## 2020-09-01 DIAGNOSIS — K760 Fatty (change of) liver, not elsewhere classified: Secondary | ICD-10-CM | POA: Insufficient documentation

## 2020-09-01 DIAGNOSIS — R110 Nausea: Secondary | ICD-10-CM

## 2020-09-01 DIAGNOSIS — R1032 Left lower quadrant pain: Secondary | ICD-10-CM | POA: Diagnosis not present

## 2020-09-01 DIAGNOSIS — M4186 Other forms of scoliosis, lumbar region: Secondary | ICD-10-CM | POA: Diagnosis not present

## 2020-09-01 MED ORDER — IOPAMIDOL (ISOVUE-300) INJECTION 61%
100.0000 mL | Freq: Once | INTRAVENOUS | Status: AC | PRN
  Administered 2020-09-01: 100 mL via INTRAVENOUS

## 2020-09-02 ENCOUNTER — Encounter (INDEPENDENT_AMBULATORY_CARE_PROVIDER_SITE_OTHER): Payer: Medicare Other | Admitting: Ophthalmology

## 2020-09-02 MED ORDER — HYDROCODONE-ACETAMINOPHEN 5-325 MG PO TABS: 1.0000 | ORAL_TABLET | Freq: Four times a day (QID) | ORAL | 0 refills | Status: DC | PRN

## 2020-09-08 ENCOUNTER — Encounter: Payer: Self-pay | Admitting: Family Medicine

## 2020-09-08 ENCOUNTER — Other Ambulatory Visit: Payer: Self-pay

## 2020-09-08 ENCOUNTER — Ambulatory Visit (INDEPENDENT_AMBULATORY_CARE_PROVIDER_SITE_OTHER): Payer: Medicare Other | Admitting: Family Medicine

## 2020-09-08 VITALS — BP 134/72 | HR 75 | Temp 97.7°F | Resp 14 | Ht 63.0 in | Wt 202.0 lb

## 2020-09-08 DIAGNOSIS — R079 Chest pain, unspecified: Secondary | ICD-10-CM | POA: Diagnosis not present

## 2020-09-08 DIAGNOSIS — E1169 Type 2 diabetes mellitus with other specified complication: Secondary | ICD-10-CM

## 2020-09-08 DIAGNOSIS — R1013 Epigastric pain: Secondary | ICD-10-CM

## 2020-09-08 DIAGNOSIS — N9489 Other specified conditions associated with female genital organs and menstrual cycle: Secondary | ICD-10-CM | POA: Diagnosis not present

## 2020-09-08 MED ORDER — DAPAGLIFLOZIN PROPANEDIOL 10 MG PO TABS: 10.0000 mg | ORAL_TABLET | Freq: Every day | ORAL | 3 refills | Status: DC

## 2020-09-08 NOTE — Progress Notes (Signed)
Subjective:    Patient ID: Carolyn [name], female    DOB: 10/14/1951, 69 y.o.   MRN: [id number]  Patient presents for Follow-up (abd pain/ pelvic congestion)   Pt here with ongoing abdominal discomfort and back pain.  No change in bowel movements, no urinary symptoms  only thing she noticed different is that she has also been getting epigastric pain but the pain moves around mostly was on her left side.  She does have underlying chronic back pain but this feels different from her chronic back pain she has not had the typical sciatica symptoms but she did have some left knee pain last week.  She used a hydrocodone with the pain is severe although in general it has improved some.  CT scan was obtained and did not show any acute abnormality in the outer region but did show degenerative changes in her spine also showed some pelvic congestion though she is not having significant pelvic or lower abdomen pain.   Metformin stopped due to diarrhea, CBG went up to  220 fasting A1C 6.8%  Also states last night she had episode of chest pain that went a little bit into her upper left shoulder but did not last very long. Review Of Systems:  GEN- denies fatigue, fever, weight loss,weakness, recent illness HEENT- denies eye drainage, change in vision, nasal discharge, CVS- denies chest pain, palpitations RESP- denies SOB, cough, wheeze ABD- denies N/V, change in stools, +abd pain GU- denies dysuria, hematuria, dribbling, incontinence MSK- +joint pain, muscle aches, injury Neuro- denies headache, dizziness, syncope, seizure activity       Objective:    BP 134/72   Pulse 75   Temp 97.7 F (36.5 C) (Temporal)   Resp 14   Ht 5\' 3"  (1.6 m)   Wt 202 lb (91.6 kg)   SpO2 96%   BMI 35.78 kg/m  GEN- NAD, alert and oriented x3 HEENT- PERRL, EOMI, non injected sclera, pink conjunctiva, MMM, oropharynx clear Neck- Supple, no thyromegaly CVS- RRR, no murmur RESP-CTAB ABD-NABS,soft,mild TTP  epigastric, no rebound, no guarding, no CVA tenderness, mild tenderness to palpation in the left upper quadrant  MSK- decreased ROM spine, TTP left lumbarparaspinals, non antalgic gait  Neuro- normal tone LE, sensation in tact, strength grossly in tact  EXT- No edema Pulses- Radial, DP- 2+  EKG normal sinus rhythm, no new ST changes      Assessment & Plan:      Problem List Items Addressed This Visit      Unprioritized   Diabetes mellitus, type II ([place]) - Primary    Her blood sugars are starting to rise now she is off of Metformin but discontinue the Metformin has helped with the diarrhea so she will remain off.  I have given her samples of Farxiga 10 mg to try   Epigastric abdominal pain ongoing left upper quadrant pain although intermittent in the setting of her chronic back pain is difficult to tease this out.  I Georgina Peer have her evaluated by gastroenterology as possible ulceration or gastritis is contributing to the pain.  We will have her discontinue NSAIDs as well.   Pelvic congestion she will be referred to gynecology for further evaluation   EKG no further chest pain advised to go to ER she does get recurrent chest pain.  EKG was unchanged from previous      Relevant Medications   dapagliflozin propanediol (FARXIGA) 10 MG TABS tablet    Other Visit Diagnoses    Epigastric  abdominal pain       Relevant Orders   Ambulatory referral to Gastroenterology   Chest pain, unspecified type       Relevant Orders   EKG 12-Lead (Completed)   Pelvic congestion syndrome       Relevant Orders   Ambulatory referral to Gynecology      Note: This dictation was prepared with Dragon dictation along with smaller phrase technology. Any transcriptional errors that result from this process are unintentional.

## 2020-09-08 NOTE — Patient Instructions (Signed)
Referral to GYN Referral to GI Avoid NSAIDS Try Wilder Glade 10mg  once a day in the morning for blood sugars F/U pending evaluations

## 2020-09-08 NOTE — Assessment & Plan Note (Signed)
Her blood sugars are starting to rise now she is off of Metformin but discontinue the Metformin has helped with the diarrhea so she will remain off.  I have given her samples of Farxiga 10 mg to try   Epigastric abdominal pain ongoing left upper quadrant pain although intermittent in the setting of her chronic back pain is difficult to tease this out.  [name] have her evaluated by gastroenterology as possible ulceration or gastritis is contributing to the pain.  We will have her discontinue NSAIDs as well.   Pelvic congestion she will be referred to gynecology for further evaluation   EKG no further chest pain advised to go to ER she does get recurrent chest pain.  EKG was unchanged from previous

## 2020-09-09 ENCOUNTER — Encounter (INDEPENDENT_AMBULATORY_CARE_PROVIDER_SITE_OTHER): Payer: Self-pay | Admitting: Gastroenterology

## 2020-09-17 ENCOUNTER — Other Ambulatory Visit: Payer: Self-pay

## 2020-09-17 ENCOUNTER — Encounter (INDEPENDENT_AMBULATORY_CARE_PROVIDER_SITE_OTHER): Payer: Self-pay | Admitting: Ophthalmology

## 2020-09-17 ENCOUNTER — Ambulatory Visit (INDEPENDENT_AMBULATORY_CARE_PROVIDER_SITE_OTHER): Payer: Medicare Other | Admitting: Ophthalmology

## 2020-09-17 DIAGNOSIS — G4733 Obstructive sleep apnea (adult) (pediatric): Secondary | ICD-10-CM | POA: Diagnosis not present

## 2020-09-17 DIAGNOSIS — H47292 Other optic atrophy, left eye: Secondary | ICD-10-CM | POA: Diagnosis not present

## 2020-09-17 DIAGNOSIS — H43812 Vitreous degeneration, left eye: Secondary | ICD-10-CM

## 2020-09-17 DIAGNOSIS — E1169 Type 2 diabetes mellitus with other specified complication: Secondary | ICD-10-CM

## 2020-09-17 NOTE — Assessment & Plan Note (Signed)
Compliant with CPAP use for the last 15 years

## 2020-09-17 NOTE — Patient Instructions (Signed)
Return to Dr. [name]

## 2020-09-17 NOTE — Progress Notes (Signed)
09/17/2020     CHIEF COMPLAINT Patient presents for Retina Follow Up   HISTORY OF PRESENT ILLNESS: Carolyn [name] is a 69 y.o. female who presents to the clinic today for:   HPI    Retina Follow Up    Patient presents with  Other.  In left eye.  This started 4 months ago.  Severity is mild.  Duration of 4 months.  Since onset it is stable.          Comments    4 Month F/U OU  Pt denies noticeable changes to [place] OU since last visit. Pt denies ocular pain, flashes of light, or floaters OU.         Last edited by Rockie Neighbours, [place] on 09/17/2020  1:09 PM. (History)      Referring physician: Alycia Rossetti, MD [place],  [place] [address]  HISTORICAL INFORMATION:   Selected notes from the MEDICAL RECORD NUMBER    Lab Results  Component Value Date   HGBA1C 6.8 (H) 08/24/2020     CURRENT MEDICATIONS: No current outpatient medications on file. (Ophthalmic Drugs)   No current facility-administered medications for this visit. (Ophthalmic Drugs)   Current Outpatient Medications (Other)  Medication Sig  . Blood Glucose Monitoring Suppl (BLOOD GLUCOSE SYSTEM PAK) KIT Use as directed to monitor FSBS 2x daily. Dx: E11.9  . Cholecalciferol (VITAMIN D3) 1000 units CAPS Take by mouth daily.  . dapagliflozin propanediol (FARXIGA) 10 MG TABS tablet Take 1 tablet (10 mg total) by mouth daily before breakfast.  . HYDROcodone-acetaminophen (NORCO) 5-325 MG tablet Take 1 tablet by mouth every 6 (six) hours as needed for moderate pain.  Marland Kitchen icosapent Ethyl (VASCEPA) 1 g capsule TAKE 2 CAPSULES BY MOUTH TWICE DAILY WITH A MEAL  . losartan (COZAAR) 100 MG tablet Take 1 tablet (100 mg total) by mouth daily.  . meloxicam (MOBIC) 7.5 MG tablet Take 1 tablet (7.5 mg total) by mouth daily.  . metFORMIN (GLUCOPHAGE) 500 MG tablet Take 1 tablet (500 mg total) by mouth daily with breakfast.  . metoprolol succinate (TOPROL-XL) 50 MG 24 hr tablet TAKE 1 TABLET BY MOUTH DAILY    . Multiple Vitamins-Minerals (MULTI COMPLETE PO) Take by mouth daily.  . NON FORMULARY Take by mouth daily.  . NON FORMULARY Take by mouth.  Glory Rosebush VERIO test strip USE TO TEST BLOOD SUGAR TWICE DAILY  . Pitavastatin Calcium (LIVALO) 2 MG TABS Take 1 tablet (2 mg total) by mouth daily.  . WELLBUTRIN XL 150 MG 24 hr tablet TAKE 1 TABLET(150 MG) BY MOUTH DAILY   No current facility-administered medications for this visit. (Other)      REVIEW OF SYSTEMS:    ALLERGIES Allergies  Allergen Reactions  . Prozac [Fluoxetine Hcl] Anaphylaxis  . Codeine Other (See Comments)    Intolerance   . Other     -mycin medications: has myasthenia gravis   . Sulfa Antibiotics Hives    PAST MEDICAL HISTORY Past Medical History:  Diagnosis Date  . Depression   . Diabetes mellitus without complication ([place])   . Diastolic dysfunction   . Hyperlipidemia   . Myasthenia gravis ([place])   . Patient is Jehovah's Witness   . Sleep apnea    Past Surgical History:  Procedure Laterality Date  . APPENDECTOMY    . BUNIONECTOMY    . MANDIBLE SURGERY    . TONSILECTOMY, ADENOIDECTOMY, BILATERAL MYRINGOTOMY AND TUBES  FAMILY HISTORY Family History  Problem Relation Age of Onset  . Arthritis Mother   . Depression Mother   . Diabetes Mother   . Hypertension Mother   . Alcohol abuse Father   . Hypertension Father   . Heart disease Father 69       Massive MI  . Diabetes Brother   . Diabetes Brother   . Heart disease Brother   . Diabetes Brother        Type I     SOCIAL HISTORY Social History   Tobacco Use  . Smoking status: Never Smoker  . Smokeless tobacco: Never Used  Vaping Use  . Vaping Use: Never used  Substance Use Topics  . Alcohol use: Yes    Comment: socially  . Drug use: No         OPHTHALMIC EXAM:  Base Eye Exam    Visual Acuity (ETDRS)      Right Left   Dist cc 20/30 20/20   Dist ph cc 20/25 +2    Correction: Glasses       Tonometry (Tonopen, 1:10  PM)      Right Left   Pressure 13 13       Pupils      Pupils Dark Light Shape React APD   Right PERRL 5 3 Round Brisk None   Left PERRL 5 3 Round Brisk None       Visual Fields (Counting fingers)      Left Right    Full Full       Extraocular Movement      Right Left    Full Full       Neuro/Psych    Oriented x3: Yes   Mood/Affect: Normal       Dilation    Both eyes: 1.0% Mydriacyl, 2.5% Phenylephrine @ 1:13 PM        Slit Lamp and Fundus Exam    External Exam      Right Left   External Normal Normal       Slit Lamp Exam      Right Left   Lids/Lashes Normal Normal   Conjunctiva/Sclera White and quiet White and quiet   Cornea Clear Clear   Anterior Chamber Deep and quiet Deep and quiet   Iris Round and reactive Round and reactive   Lens 2+ Nuclear sclerosis, 1+ Cortical cataract 2+ Nuclear sclerosis   Anterior Vitreous Normal Normal       Fundus Exam      Right Left   Posterior Vitreous Normal Normal   Disc Normal Temporal pallor, sloped temporal aspect of the nerve minor   C/D Ratio 0.45 0.5   Macula Normal Normal   Vessels Normal Normal   Periphery Normal Normal          IMAGING AND PROCEDURES  Imaging and Procedures for 09/17/20           ASSESSMENT/PLAN:  Sleep apnea Compliant with CPAP use for the last 15 years  Diabetes mellitus, type II ([place]) The patient has diabetes without any evidence of retinopathy. The patient advised to maintain good blood glucose control, excellent blood pressure control, and favorable levels of cholesterol, low density lipoprotein, and high density lipoproteins. Follow up in 1 year was recommended. Explained that fluctuations in visual acuity , or "out of focus", may result from large variations of blood sugar control.      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 Posterior vitreous detachment of left eye  H43.812 OCT, Retina - OU - Both  Eyes  3. Obstructive sleep apnea syndrome  G47.33   4. Type 2 diabetes mellitus with other specified complication, without long-term current use of insulin (HCC)  E11.69     1.  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  2.  She continues to be compliant with CPAP use.  I stressed the importance of continued CPAP monitoring, compliance so as to maximize nightly oxygenation of the optic nerves, macula and brain  3.  Ophthalmic Meds Ordered this visit:  No orders of the defined types were placed in this encounter.      Return in about 2 years (around 09/17/2022), or if symptoms worsen or fail to improve, for DILATE OU, COLOR FP.  Patient Instructions  Return to Dr. [name]    Explained the diagnoses, plan, and follow up with the patient and they expressed understanding.  Patient expressed understanding of the importance of proper follow up care.   Clent Demark Junella Domke M.D. Diseases & Surgery of the Retina and Vitreous [facility] [place] 09/17/20     Abbreviations: M myopia (nearsighted); A astigmatism; H hyperopia (farsighted); P presbyopia; Mrx spectacle prescription;  CTL contact lenses; OD right eye; OS left eye; OU both eyes  XT exotropia; ET esotropia; PEK punctate epithelial keratitis; PEE punctate epithelial erosions; DES dry eye syndrome; MGD meibomian gland dysfunction; ATs artificial tears; PFAT's preservative free artificial tears; [place] nuclear sclerotic cataract; PSC posterior subcapsular cataract; ERM epi-retinal membrane; PVD posterior vitreous detachment; RD retinal detachment; DM diabetes mellitus; DR diabetic retinopathy; NPDR non-proliferative diabetic retinopathy; PDR proliferative diabetic retinopathy; CSME clinically significant macular edema; DME diabetic macular edema; dbh dot blot hemorrhages; CWS cotton wool  spot; POAG primary open angle glaucoma; C/D cup-to-disc ratio; HVF humphrey visual field; GVF goldmann visual field; OCT optical coherence tomography; IOP intraocular pressure; BRVO Branch retinal vein occlusion; CRVO central retinal vein occlusion; CRAO central retinal artery occlusion; BRAO branch retinal artery occlusion; RT retinal tear; SB scleral buckle; PPV pars plana vitrectomy; VH Vitreous hemorrhage; PRP panretinal laser photocoagulation; IVK intravitreal kenalog; VMT vitreomacular traction; MH Macular hole;  NVD neovascularization of the disc; NVE neovascularization elsewhere; AREDS age related eye disease study; ARMD age related macular degeneration; POAG primary open angle glaucoma; EBMD epithelial/anterior basement membrane dystrophy; ACIOL anterior chamber intraocular lens; IOL intraocular lens; PCIOL posterior chamber intraocular lens; Phaco/IOL phacoemulsification with intraocular lens placement; [place] photorefractive keratectomy; LASIK laser assisted in situ keratomileusis; HTN hypertension; DM diabetes mellitus; COPD chronic obstructive pulmonary disease

## 2020-09-17 NOTE — Assessment & Plan Note (Signed)

## 2020-09-28 ENCOUNTER — Encounter: Payer: Self-pay | Admitting: Family Medicine

## 2020-09-28 MED ORDER — DAPAGLIFLOZIN PROPANEDIOL 10 MG PO TABS
10.0000 mg | ORAL_TABLET | Freq: Every day | ORAL | 3 refills | Status: DC
Start: 2020-09-28 — End: 2021-01-04

## 2020-10-14 ENCOUNTER — Other Ambulatory Visit: Payer: Self-pay

## 2020-10-14 ENCOUNTER — Telehealth (INDEPENDENT_AMBULATORY_CARE_PROVIDER_SITE_OTHER): Payer: Self-pay

## 2020-10-14 ENCOUNTER — Other Ambulatory Visit (INDEPENDENT_AMBULATORY_CARE_PROVIDER_SITE_OTHER): Payer: Self-pay

## 2020-10-14 ENCOUNTER — Ambulatory Visit (INDEPENDENT_AMBULATORY_CARE_PROVIDER_SITE_OTHER): Payer: Medicare Other | Admitting: Gastroenterology

## 2020-10-14 ENCOUNTER — Encounter (INDEPENDENT_AMBULATORY_CARE_PROVIDER_SITE_OTHER): Payer: Self-pay | Admitting: Gastroenterology

## 2020-10-14 ENCOUNTER — Encounter (INDEPENDENT_AMBULATORY_CARE_PROVIDER_SITE_OTHER): Payer: Self-pay

## 2020-10-14 DIAGNOSIS — G8929 Other chronic pain: Secondary | ICD-10-CM | POA: Diagnosis not present

## 2020-10-14 DIAGNOSIS — R1013 Epigastric pain: Secondary | ICD-10-CM | POA: Diagnosis not present

## 2020-10-14 DIAGNOSIS — K582 Mixed irritable bowel syndrome: Secondary | ICD-10-CM | POA: Diagnosis not present

## 2020-10-14 DIAGNOSIS — K581 Irritable bowel syndrome with constipation: Secondary | ICD-10-CM | POA: Insufficient documentation

## 2020-10-14 DIAGNOSIS — Z860100 Personal history of colon polyps, unspecified: Secondary | ICD-10-CM

## 2020-10-14 DIAGNOSIS — Z1211 Encounter for screening for malignant neoplasm of colon: Secondary | ICD-10-CM

## 2020-10-14 DIAGNOSIS — K589 Irritable bowel syndrome without diarrhea: Secondary | ICD-10-CM | POA: Insufficient documentation

## 2020-10-14 MED ORDER — NA SULFATE-K SULFATE-MG SULF 17.5-3.13-1.6 GM/177ML PO SOLN: 354.0000 mL | Freq: Once | ORAL | 0 refills | Status: AC

## 2020-10-14 MED ORDER — DICYCLOMINE HCL 10 MG PO CAPS: 10.0000 mg | ORAL_CAPSULE | Freq: Two times a day (BID) | ORAL | 1 refills | Status: DC | PRN

## 2020-10-14 NOTE — Telephone Encounter (Signed)
Carolyn [name], CMA  

## 2020-10-14 NOTE — Progress Notes (Signed)
Carolyn [name], M.D. Gastroenterology & Hepatology [facility] [address] [place], [place] [address]  Primary Care Physician: Alycia Rossetti, MD [address] [place] [address]  I will communicate my assessment and recommendations to the referring MD via EMR. "Note: Occasional unusual wording and randomly placed punctuation marks may result from the use of speech recognition technology to transcribe this document"  Problems: 1. Irritable bowel syndrome - mixed 2. Chronic epigastric abdominal pain  History of Present Illness: Carolyn [name] is a 69 y.o. female with PMH DM, depression HLD, MG and IBS, who presents for evaluation of episodes of abdominal pain.  The patient was last seen on 12/19/2017. At that time, the patient was evaluated for dysphagia, was ordered a barium esophagram which showed some laryngeal contrast penetration and mild age related dysmotility but no other abnormalities.  Patient reports that since October 2021 she has presented intermittent episodes of epigastric abdomen. She reports having recurrent episodes of epigastric and occasional L upper lumbar pain pain described as "recurrent ache", which was happening on a daily basis. Patient states that when she was taken off metformin recently she had improvement but no complete resolution of the pain - she is currently having the pain 2-3 times a week, 4/10 in intensity. She was prescribed Norco by her PCP with partial improvement of her pain, but does not take it frequently. Does not take NSAIDs regularly. The patient had some nausea with some episodes of vomiting very occasionally. She also reports having bloating of her abdomen intermittently. Denies following any specific diet at the moment.  She also reports having a lonstanding history of constipation, with some bouts of diarrhea in the past. She tried a diet in the past, which helped but did not  specify the details behind the diet. Has a BM every other day, usually takes prunes or magnesium to have BMs, but does not take it daily.  Had some dysphagia in the past that did not improve with PPI, she has stopped it. She states that for the last 6 months these symptoms have almost resolved, has them very infrequently.  Denies fever, chills, hematochezia, melena, hematemesis, jaundice, pruritus or weight loss.  As part of the investigations for her abdominal pain, she had a CT abdomen pelvis with IV contrast on 09/01/2020. Showing:  No intestinal cause of left lower quadrant pain identified. No evidence of diverticulosis or diverticulitis. Normal amount of fecal matter within the colon. Prominent gonadal veins, more prominent on the left.   Last EGD: never Last Colonoscopy: 2015 - one 5 mm polyp in DC, two 4-5 mm sigmoid colon, internal hemorrhoids  FHx: neg for any gastrointestinal/liver disease, no malignancies Social: neg smoking, alcohol or illicit drug use Surgical: appendectomy  Past Medical History: Past Medical History:  Diagnosis Date  . Depression   . Diabetes mellitus without complication ([place])   . Diastolic dysfunction   . Hyperlipidemia   . Myasthenia gravis ([place])   . Patient is Jehovah's Witness   . Sleep apnea     Past Surgical History: Past Surgical History:  Procedure Laterality Date  . APPENDECTOMY    . BUNIONECTOMY    . MANDIBLE SURGERY    . TONSILECTOMY, ADENOIDECTOMY, BILATERAL MYRINGOTOMY AND TUBES      Family History: Family History  Problem Relation Age of Onset  . Arthritis Mother   . Depression Mother   . Diabetes Mother   . Hypertension Mother   .  Alcohol abuse Father   . Hypertension Father   . Heart disease Father 55       Massive MI  . Diabetes Brother   . Diabetes Brother   . Heart disease Brother   . Diabetes Brother        Type I     Social History: Social History   Tobacco Use  Smoking Status Never Smoker  Smokeless  Tobacco Never Used   Social History   Substance and Sexual Activity  Alcohol Use Yes   Comment: socially   Social History   Substance and Sexual Activity  Drug Use No    Allergies: Allergies  Allergen Reactions  . Prozac [Fluoxetine Hcl] Anaphylaxis  . Codeine Other (See Comments)    Intolerance   . Other     -mycin medications: has myasthenia gravis   . Sulfa Antibiotics Hives    Medications: Current Outpatient Medications  Medication Sig Dispense Refill  . Blood Glucose Monitoring Suppl (BLOOD GLUCOSE SYSTEM PAK) KIT Use as directed to monitor FSBS 2x daily. Dx: E11.9 1 each 1  . Cholecalciferol (VITAMIN D3) 1000 units CAPS Take by mouth daily.    . dapagliflozin propanediol (FARXIGA) 10 MG TABS tablet Take 1 tablet (10 mg total) by mouth daily before breakfast. 30 tablet 3  . icosapent Ethyl (VASCEPA) 1 g capsule TAKE 2 CAPSULES BY MOUTH TWICE DAILY WITH A MEAL 120 capsule 3  . losartan (COZAAR) 100 MG tablet Take 1 tablet (100 mg total) by mouth daily. 90 tablet 3  . meloxicam (MOBIC) 7.5 MG tablet Take 1 tablet (7.5 mg total) by mouth daily. 30 tablet 0  . metoprolol succinate (TOPROL-XL) 50 MG 24 hr tablet TAKE 1 TABLET BY MOUTH DAILY 90 tablet 3  . Multiple Vitamins-Minerals (MULTI COMPLETE PO) Take by mouth daily.    . NON FORMULARY Take by mouth daily.    . NON FORMULARY Take by mouth.    Glory Rosebush VERIO test strip USE TO TEST BLOOD SUGAR TWICE DAILY 100 strip 3  . WELLBUTRIN XL 150 MG 24 hr tablet TAKE 1 TABLET(150 MG) BY MOUTH DAILY 90 tablet 3  . HYDROcodone-acetaminophen (NORCO) 5-325 MG tablet Take 1 tablet by mouth every 6 (six) hours as needed for moderate pain. (Patient not taking: Reported on 10/14/2020) 20 tablet 0   No current facility-administered medications for this visit.    Review of Systems: GENERAL: negative for malaise, night sweats HEENT: No changes in hearing or vision, no nose bleeds or other nasal problems. NECK: Negative for lumps,  goiter, pain and significant neck swelling RESPIRATORY: Negative for cough, wheezing CARDIOVASCULAR: Negative for chest pain, leg swelling, palpitations, orthopnea GI: SEE HPI MUSCULOSKELETAL: Negative for joint pain or swelling, back pain, and muscle pain. SKIN: Negative for lesions, rash PSYCH: Negative for sleep disturbance, mood disorder and recent psychosocial stressors. HEMATOLOGY Negative for prolonged bleeding, bruising easily, and swollen nodes. ENDOCRINE: Negative for cold or heat intolerance, polyuria, polydipsia and goiter. NEURO: negative for tremor, gait imbalance, syncope and seizures. The remainder of the review of systems is noncontributory.   Physical Exam: BP (!) 145/90 (BP Location: Left Arm, Patient Position: Sitting, Cuff Size: Large)   Pulse 67   Temp 98.4 F (36.9 C) (Oral)   Ht _0  (1.6 m)   Wt 203 lb (92.1 kg)   BMI 35.96 kg/m  GENERAL: The patient is AO x3, in no acute distress. Obese. HEENT: Head is normocephalic and atraumatic. EOMI are intact. Mouth is well  hydrated and without lesions. NECK: Supple. No masses LUNGS: Clear to auscultation. No presence of rhonchi/wheezing/rales. Adequate chest expansion HEART: RRR, normal s1 and s2. ABDOMEN: tender to palpation of ep[igastric area and periumbilical region, no guarding, no peritoneal signs, and nondistended. BS +. No masses. EXTREMITIES: Without any cyanosis, clubbing, rash, lesions or edema. NEUROLOGIC: AOx3, no focal motor deficit. SKIN: no jaundice, no rashes  Imaging/Labs: as above  I personally reviewed and interpreted the available labs, imaging and endoscopic files. No acute alterations found in the abdominal imaging.  Impression and Plan: Carolyn [name] is a 69 y.o. female with PMH DM, depression HLD, MG and IBS, who presents for evaluation of episodes of abdominal pain. The patient has had recurrent abdominal pain of unclear etiology. Cross sectional imaging was reviewed and no alterations  explain her abdominal pain. She has not  Presented any red flag signs. Will need to proceed with EGD to rule out organica causes including malignancy or PUD. I consider it is likely her symptoms are related to IBS-M as she has had a chronic history of change in BMs. Will benefit from Bentyl as needed for abdominal pain for now. Was also counseled to start Miralax daily to improve BM frequency and to implement a low FODMAP diet for IBS. Finally, she is due for repeat colonoscopy given history of colon polyps.  -Schedule EGD with SB biopsies and colonoscopy - Explained presumed etiology of IBS symptoms. Patient was counseled about the benefit of implementing a low FODMAP to improve symptoms and recurrent episodes. A dietary list was provided to the patient. Also, the patient was counseled about the benefit of avoiding stressing situations and potential environmental triggers leading to symptomatology. - Start Bentyl as needed for abdominal pain/bloating - Start taking Miralax 1 cap every day for one week. If bowel movements do not improve, increase to 1 cup every 12 hours. If after two weeks there is no improvement, increase to 1 cup every 8 hours - RTC 3 months  All questions were answered.      Harvel Quale, MD Gastroenterology and Hepatology [facility]

## 2020-10-14 NOTE — H&P (View-Only) (Signed)
[name], M.D. Gastroenterology & Hepatology [facility] [address] [place], [place] [address]  Primary Care Physician: Alycia Rossetti, MD [address] [place] [address]  I will communicate my assessment and recommendations to the referring MD via EMR. "Note: Occasional unusual wording and randomly placed punctuation marks may result from the use of speech recognition technology to transcribe this document"  Problems: 1. Irritable bowel syndrome - mixed 2. Chronic epigastric abdominal pain  History of Present Illness: Carolyn [name] is a 69 y.o. female with PMH DM, depression HLD, MG and IBS, who presents for evaluation of episodes of abdominal pain.  The patient was last seen on 12/19/2017. At that time, the patient was evaluated for dysphagia, was ordered a barium esophagram which showed some laryngeal contrast penetration and mild age related dysmotility but no other abnormalities.  Patient reports that since October 2021 she has presented intermittent episodes of epigastric abdomen. She reports having recurrent episodes of epigastric and occasional L upper lumbar pain pain described as "recurrent ache", which was happening on a daily basis. Patient states that when she was taken off metformin recently she had improvement but no complete resolution of the pain - she is currently having the pain 2-3 times a week, 4/10 in intensity. She was prescribed Norco by her PCP with partial improvement of her pain, but does not take it frequently. Does not take NSAIDs regularly. The patient had some nausea with some episodes of vomiting very occasionally. She also reports having bloating of her abdomen intermittently. Denies following any specific diet at the moment.  She also reports having a lonstanding history of constipation, with some bouts of diarrhea in the past. She tried a diet in the past, which helped but did not  specify the details behind the diet. Has a BM every other day, usually takes prunes or magnesium to have BMs, but does not take it daily.  Had some dysphagia in the past that did not improve with PPI, she has stopped it. She states that for the last 6 months these symptoms have almost resolved, has them very infrequently.  Denies fever, chills, hematochezia, melena, hematemesis, jaundice, pruritus or weight loss.  As part of the investigations for her abdominal pain, she had a CT abdomen pelvis with IV contrast on 09/01/2020. Showing:  No intestinal cause of left lower quadrant pain identified. No evidence of diverticulosis or diverticulitis. Normal amount of fecal matter within the colon. Prominent gonadal veins, more prominent on the left.   Last EGD: never Last Colonoscopy: 2015 - one 5 mm polyp in DC, two 4-5 mm sigmoid colon, internal hemorrhoids  FHx: neg for any gastrointestinal/liver disease, no malignancies Social: neg smoking, alcohol or illicit drug use Surgical: appendectomy  Past Medical History: Past Medical History:  Diagnosis Date  . Depression   . Diabetes mellitus without complication ([place])   . Diastolic dysfunction   . Hyperlipidemia   . Myasthenia gravis ([place])   . Patient is Jehovah's Witness   . Sleep apnea     Past Surgical History: Past Surgical History:  Procedure Laterality Date  . APPENDECTOMY    . BUNIONECTOMY    . MANDIBLE SURGERY    . TONSILECTOMY, ADENOIDECTOMY, BILATERAL MYRINGOTOMY AND TUBES      Family History: Family History  Problem Relation Age of Onset  . Arthritis Mother   . Depression Mother   . Diabetes Mother   . Hypertension Mother   .  Alcohol abuse Father   . Hypertension Father   . Heart disease Father 55       Massive MI  . Diabetes Brother   . Diabetes Brother   . Heart disease Brother   . Diabetes Brother        Type I     Social History: Social History   Tobacco Use  Smoking Status Never Smoker  Smokeless  Tobacco Never Used   Social History   Substance and Sexual Activity  Alcohol Use Yes   Comment: socially   Social History   Substance and Sexual Activity  Drug Use No    Allergies: Allergies  Allergen Reactions  . Prozac [Fluoxetine Hcl] Anaphylaxis  . Codeine Other (See Comments)    Intolerance   . Other     -mycin medications: has myasthenia gravis   . Sulfa Antibiotics Hives    Medications: Current Outpatient Medications  Medication Sig Dispense Refill  . Blood Glucose Monitoring Suppl (BLOOD GLUCOSE SYSTEM PAK) KIT Use as directed to monitor FSBS 2x daily. Dx: E11.9 1 each 1  . Cholecalciferol (VITAMIN D3) 1000 units CAPS Take by mouth daily.    . dapagliflozin propanediol (FARXIGA) 10 MG TABS tablet Take 1 tablet (10 mg total) by mouth daily before breakfast. 30 tablet 3  . icosapent Ethyl (VASCEPA) 1 g capsule TAKE 2 CAPSULES BY MOUTH TWICE DAILY WITH A MEAL 120 capsule 3  . losartan (COZAAR) 100 MG tablet Take 1 tablet (100 mg total) by mouth daily. 90 tablet 3  . meloxicam (MOBIC) 7.5 MG tablet Take 1 tablet (7.5 mg total) by mouth daily. 30 tablet 0  . metoprolol succinate (TOPROL-XL) 50 MG 24 hr tablet TAKE 1 TABLET BY MOUTH DAILY 90 tablet 3  . Multiple Vitamins-Minerals (MULTI COMPLETE PO) Take by mouth daily.    . NON FORMULARY Take by mouth daily.    . NON FORMULARY Take by mouth.    Glory Rosebush VERIO test strip USE TO TEST BLOOD SUGAR TWICE DAILY 100 strip 3  . WELLBUTRIN XL 150 MG 24 hr tablet TAKE 1 TABLET(150 MG) BY MOUTH DAILY 90 tablet 3  . HYDROcodone-acetaminophen (NORCO) 5-325 MG tablet Take 1 tablet by mouth every 6 (six) hours as needed for moderate pain. (Patient not taking: Reported on 10/14/2020) 20 tablet 0   No current facility-administered medications for this visit.    Review of Systems: GENERAL: negative for malaise, night sweats HEENT: No changes in hearing or vision, no nose bleeds or other nasal problems. NECK: Negative for lumps,  goiter, pain and significant neck swelling RESPIRATORY: Negative for cough, wheezing CARDIOVASCULAR: Negative for chest pain, leg swelling, palpitations, orthopnea GI: SEE HPI MUSCULOSKELETAL: Negative for joint pain or swelling, back pain, and muscle pain. SKIN: Negative for lesions, rash PSYCH: Negative for sleep disturbance, mood disorder and recent psychosocial stressors. HEMATOLOGY Negative for prolonged bleeding, bruising easily, and swollen nodes. ENDOCRINE: Negative for cold or heat intolerance, polyuria, polydipsia and goiter. NEURO: negative for tremor, gait imbalance, syncope and seizures. The remainder of the review of systems is noncontributory.   Physical Exam: BP (!) 145/90 (BP Location: Left Arm, Patient Position: Sitting, Cuff Size: Large)   Pulse 67   Temp 98.4 F (36.9 C) (Oral)   Ht _0  (1.6 m)   Wt 203 lb (92.1 kg)   BMI 35.96 kg/m  GENERAL: The patient is AO x3, in no acute distress. Obese. HEENT: Head is normocephalic and atraumatic. EOMI are intact. Mouth is well  hydrated and without lesions. NECK: Supple. No masses LUNGS: Clear to auscultation. No presence of rhonchi/wheezing/rales. Adequate chest expansion HEART: RRR, normal s1 and s2. ABDOMEN: tender to palpation of ep[igastric area and periumbilical region, no guarding, no peritoneal signs, and nondistended. BS +. No masses. EXTREMITIES: Without any cyanosis, clubbing, rash, lesions or edema. NEUROLOGIC: AOx3, no focal motor deficit. SKIN: no jaundice, no rashes  Imaging/Labs: as above  I personally reviewed and interpreted the available labs, imaging and endoscopic files. No acute alterations found in the abdominal imaging.  Impression and Plan: Carolyn [name] is a 69 y.o. female with PMH DM, depression HLD, MG and IBS, who presents for evaluation of episodes of abdominal pain. The patient has had recurrent abdominal pain of unclear etiology. Cross sectional imaging was reviewed and no alterations  explain her abdominal pain. She has not  Presented any red flag signs. Will need to proceed with EGD to rule out organica causes including malignancy or PUD. I consider it is likely her symptoms are related to IBS-M as she has had a chronic history of change in BMs. Will benefit from Bentyl as needed for abdominal pain for now. Was also counseled to start Miralax daily to improve BM frequency and to implement a low FODMAP diet for IBS. Finally, she is due for repeat colonoscopy given history of colon polyps.  -Schedule EGD with SB biopsies and colonoscopy - Explained presumed etiology of IBS symptoms. Patient was counseled about the benefit of implementing a low FODMAP to improve symptoms and recurrent episodes. A dietary list was provided to the patient. Also, the patient was counseled about the benefit of avoiding stressing situations and potential environmental triggers leading to symptomatology. - Start Bentyl as needed for abdominal pain/bloating - Start taking Miralax 1 cap every day for one week. If bowel movements do not improve, increase to 1 cup every 12 hours. If after two weeks there is no improvement, increase to 1 cup every 8 hours - RTC 3 months  All questions were answered.      Harvel Quale, MD Gastroenterology and Hepatology [facility]

## 2020-10-14 NOTE — Patient Instructions (Addendum)
Schedule EGD with SB biopsies and colonoscopy Explained presumed etiology of IBS symptoms. Patient was counseled about the benefit of implementing a low FODMAP to improve symptoms and recurrent episodes. A dietary list was provided to the patient. Also, the patient was counseled about the benefit of avoiding stressing situations and potential environmental triggers leading to symptomatology. Start Bentyl as needed for abdominal pain/bloating Start taking Miralax 1 cap every day for one week. If bowel movements do not improve, increase to 1 cup every 12 hours. If after two weeks there is no improvement, increase to 1 cup every 8 hours'

## 2020-10-16 ENCOUNTER — Other Ambulatory Visit: Payer: Self-pay | Admitting: Family Medicine

## 2020-10-20 ENCOUNTER — Other Ambulatory Visit (INDEPENDENT_AMBULATORY_CARE_PROVIDER_SITE_OTHER): Payer: Self-pay

## 2020-11-06 ENCOUNTER — Telehealth: Payer: Self-pay | Admitting: Pulmonary Disease

## 2020-11-06 ENCOUNTER — Encounter: Payer: Self-pay | Admitting: Family Medicine

## 2020-11-06 DIAGNOSIS — G4733 Obstructive sleep apnea (adult) (pediatric): Secondary | ICD-10-CM

## 2020-11-09 DIAGNOSIS — G4733 Obstructive sleep apnea (adult) (pediatric): Secondary | ICD-10-CM | POA: Diagnosis not present

## 2020-11-09 NOTE — Telephone Encounter (Signed)
Attempted to call pt. Left message that we would send message to Dr Halford Chessman to ask about order for new CPAP machine.  Dr Halford Chessman, please advise if ok to send order for new CPAP to [facility].  Per OV 06/16/20: Uses CPAP nightly.  She will be due for a new machine in 2022.  Uses nasal pillows mask.  No issue with mask fit. Denies sinus congestion or dry mouth.

## 2020-11-10 ENCOUNTER — Other Ambulatory Visit (HOSPITAL_COMMUNITY)
Admission: RE | Admit: 2020-11-10 | Discharge: 2020-11-10 | Disposition: A | Discharge status: Home / Self Care | Payer: Medicare Other | Source: Ambulatory Visit | Attending: Gastroenterology | Admitting: Gastroenterology

## 2020-11-10 ENCOUNTER — Other Ambulatory Visit: Payer: Self-pay

## 2020-11-10 DIAGNOSIS — Z8261 Family history of arthritis: Secondary | ICD-10-CM | POA: Diagnosis not present

## 2020-11-10 DIAGNOSIS — Z811 Family history of alcohol abuse and dependence: Secondary | ICD-10-CM | POA: Diagnosis not present

## 2020-11-10 DIAGNOSIS — Z01812 Encounter for preprocedural laboratory examination: Secondary | ICD-10-CM | POA: Insufficient documentation

## 2020-11-10 DIAGNOSIS — K648 Other hemorrhoids: Secondary | ICD-10-CM | POA: Diagnosis not present

## 2020-11-10 DIAGNOSIS — Z882 Allergy status to sulfonamides status: Secondary | ICD-10-CM | POA: Diagnosis not present

## 2020-11-10 DIAGNOSIS — Z833 Family history of diabetes mellitus: Secondary | ICD-10-CM | POA: Diagnosis not present

## 2020-11-10 DIAGNOSIS — G7000 Myasthenia gravis without (acute) exacerbation: Secondary | ICD-10-CM | POA: Diagnosis not present

## 2020-11-10 DIAGNOSIS — K589 Irritable bowel syndrome without diarrhea: Secondary | ICD-10-CM | POA: Diagnosis not present

## 2020-11-10 DIAGNOSIS — Z888 Allergy status to other drugs, medicaments and biological substances status: Secondary | ICD-10-CM | POA: Diagnosis not present

## 2020-11-10 DIAGNOSIS — Z1211 Encounter for screening for malignant neoplasm of colon: Secondary | ICD-10-CM | POA: Diagnosis not present

## 2020-11-10 DIAGNOSIS — Z791 Long term (current) use of non-steroidal anti-inflammatories (NSAID): Secondary | ICD-10-CM | POA: Diagnosis not present

## 2020-11-10 DIAGNOSIS — Z7984 Long term (current) use of oral hypoglycemic drugs: Secondary | ICD-10-CM | POA: Diagnosis not present

## 2020-11-10 DIAGNOSIS — Z885 Allergy status to narcotic agent status: Secondary | ICD-10-CM | POA: Diagnosis not present

## 2020-11-10 DIAGNOSIS — Z20822 Contact with and (suspected) exposure to covid-19: Secondary | ICD-10-CM | POA: Insufficient documentation

## 2020-11-10 DIAGNOSIS — R1013 Epigastric pain: Secondary | ICD-10-CM | POA: Diagnosis not present

## 2020-11-10 DIAGNOSIS — Z86010 Personal history of colonic polyps: Secondary | ICD-10-CM | POA: Diagnosis not present

## 2020-11-10 DIAGNOSIS — K319 Disease of stomach and duodenum, unspecified: Secondary | ICD-10-CM | POA: Diagnosis not present

## 2020-11-10 DIAGNOSIS — Z8249 Family history of ischemic heart disease and other diseases of the circulatory system: Secondary | ICD-10-CM | POA: Diagnosis not present

## 2020-11-10 DIAGNOSIS — E119 Type 2 diabetes mellitus without complications: Secondary | ICD-10-CM | POA: Diagnosis not present

## 2020-11-10 DIAGNOSIS — Z79899 Other long term (current) drug therapy: Secondary | ICD-10-CM | POA: Diagnosis not present

## 2020-11-10 LAB — BASIC METABOLIC PANEL
Anion gap: 9 (ref 5–15)
BUN: 10 mg/dL (ref 8–23)
CO2: 26 mmol/L (ref 22–32)
Calcium: 9.4 mg/dL (ref 8.9–10.3)
Chloride: 105 mmol/L (ref 98–111)
Creatinine, Ser: 0.8 mg/dL (ref 0.44–1.00)
GFR, Estimated: >60 mL/min (ref >60)
Glucose, Bld: 177 mg/dL — ABNORMAL HIGH (ref 70–99)
Potassium: 4 mmol/L (ref 3.5–5.1)
Sodium: 140 mmol/L (ref 135–145)

## 2020-11-10 LAB — SARS CORONAVIRUS 2 (TAT 6-24 HRS): SARS Coronavirus 2: NEGATIVE

## 2020-11-10 NOTE — Telephone Encounter (Signed)
Spoke with the pt and notified of response per Dr Halford Chessman. Order was sent to [facility]. Pt aware to call for 31-90 day rov once she gets her machine.

## 2020-11-10 NOTE — Telephone Encounter (Signed)
Okay to send order to [facility] to arrange for new CPAP machine at 7 cm H2O with heated humidity.  She will need ROV 3 months after she gets new machine.

## 2020-11-11 ENCOUNTER — Encounter (HOSPITAL_COMMUNITY)
Admission: RE | Disposition: A | Discharge status: Home / Self Care | Payer: Self-pay | Source: Home / Self Care | Attending: Gastroenterology

## 2020-11-11 ENCOUNTER — Telehealth: Payer: Self-pay | Admitting: Pulmonary Disease

## 2020-11-11 ENCOUNTER — Encounter (HOSPITAL_COMMUNITY): Payer: Self-pay | Admitting: Gastroenterology

## 2020-11-11 ENCOUNTER — Other Ambulatory Visit: Payer: Self-pay

## 2020-11-11 ENCOUNTER — Ambulatory Visit (HOSPITAL_COMMUNITY)
Admission: RE | Admit: 2020-11-11 | Discharge: 2020-11-11 | Disposition: A | Discharge status: Home / Self Care | Payer: Medicare Other | Attending: Gastroenterology | Admitting: Gastroenterology

## 2020-11-11 ENCOUNTER — Ambulatory Visit (HOSPITAL_COMMUNITY): Payer: Medicare Other | Admitting: Anesthesiology

## 2020-11-11 DIAGNOSIS — Z885 Allergy status to narcotic agent status: Secondary | ICD-10-CM | POA: Diagnosis not present

## 2020-11-11 DIAGNOSIS — Z882 Allergy status to sulfonamides status: Secondary | ICD-10-CM | POA: Diagnosis not present

## 2020-11-11 DIAGNOSIS — Z86010 Personal history of colonic polyps: Secondary | ICD-10-CM | POA: Insufficient documentation

## 2020-11-11 DIAGNOSIS — K589 Irritable bowel syndrome without diarrhea: Secondary | ICD-10-CM | POA: Insufficient documentation

## 2020-11-11 DIAGNOSIS — Z09 Encounter for follow-up examination after completed treatment for conditions other than malignant neoplasm: Secondary | ICD-10-CM | POA: Diagnosis not present

## 2020-11-11 DIAGNOSIS — Z811 Family history of alcohol abuse and dependence: Secondary | ICD-10-CM | POA: Insufficient documentation

## 2020-11-11 DIAGNOSIS — I10 Essential (primary) hypertension: Secondary | ICD-10-CM | POA: Diagnosis not present

## 2020-11-11 DIAGNOSIS — Z833 Family history of diabetes mellitus: Secondary | ICD-10-CM | POA: Diagnosis not present

## 2020-11-11 DIAGNOSIS — Z8249 Family history of ischemic heart disease and other diseases of the circulatory system: Secondary | ICD-10-CM | POA: Diagnosis not present

## 2020-11-11 DIAGNOSIS — Z7984 Long term (current) use of oral hypoglycemic drugs: Secondary | ICD-10-CM | POA: Insufficient documentation

## 2020-11-11 DIAGNOSIS — R1013 Epigastric pain: Secondary | ICD-10-CM | POA: Diagnosis not present

## 2020-11-11 DIAGNOSIS — Z1211 Encounter for screening for malignant neoplasm of colon: Secondary | ICD-10-CM | POA: Insufficient documentation

## 2020-11-11 DIAGNOSIS — K3189 Other diseases of stomach and duodenum: Secondary | ICD-10-CM

## 2020-11-11 DIAGNOSIS — K319 Disease of stomach and duodenum, unspecified: Secondary | ICD-10-CM | POA: Insufficient documentation

## 2020-11-11 DIAGNOSIS — K648 Other hemorrhoids: Secondary | ICD-10-CM

## 2020-11-11 DIAGNOSIS — Z888 Allergy status to other drugs, medicaments and biological substances status: Secondary | ICD-10-CM | POA: Insufficient documentation

## 2020-11-11 DIAGNOSIS — Z791 Long term (current) use of non-steroidal anti-inflammatories (NSAID): Secondary | ICD-10-CM | POA: Diagnosis not present

## 2020-11-11 DIAGNOSIS — G7000 Myasthenia gravis without (acute) exacerbation: Secondary | ICD-10-CM | POA: Insufficient documentation

## 2020-11-11 DIAGNOSIS — Z8261 Family history of arthritis: Secondary | ICD-10-CM | POA: Insufficient documentation

## 2020-11-11 DIAGNOSIS — Z79899 Other long term (current) drug therapy: Secondary | ICD-10-CM | POA: Diagnosis not present

## 2020-11-11 DIAGNOSIS — E119 Type 2 diabetes mellitus without complications: Secondary | ICD-10-CM | POA: Diagnosis not present

## 2020-11-11 DIAGNOSIS — K2950 Unspecified chronic gastritis without bleeding: Secondary | ICD-10-CM | POA: Diagnosis not present

## 2020-11-11 HISTORY — PX: COLONOSCOPY WITH PROPOFOL: SHX5780

## 2020-11-11 HISTORY — PX: ESOPHAGOGASTRODUODENOSCOPY (EGD) WITH PROPOFOL: SHX5813

## 2020-11-11 HISTORY — PX: BIOPSY: SHX5522

## 2020-11-11 LAB — HM COLONOSCOPY

## 2020-11-11 LAB — GLUCOSE, CAPILLARY: Glucose-Capillary: 147 mg/dL — ABNORMAL HIGH (ref 70–99)

## 2020-11-11 SURGERY — COLONOSCOPY WITH PROPOFOL
Anesthesia: General

## 2020-11-11 MED ORDER — LACTATED RINGERS IV SOLN: INTRAVENOUS | Status: DC | PRN

## 2020-11-11 MED ORDER — PROPOFOL 10 MG/ML IV BOLUS
INTRAVENOUS | Status: DC | PRN
  Administered 2020-11-11: 100 mg via INTRAVENOUS
  Administered 2020-11-11: 150 ug/kg/min via INTRAVENOUS

## 2020-11-11 MED ORDER — LIDOCAINE VISCOUS HCL 2 % MT SOLN
OROMUCOSAL | Status: AC
  Filled 2020-11-11: qty 15

## 2020-11-11 MED ORDER — OMEPRAZOLE 40 MG PO CPDR: 40.0000 mg | DELAYED_RELEASE_CAPSULE | Freq: Every day | ORAL | 1 refills | Status: DC

## 2020-11-11 MED ORDER — GLYCOPYRROLATE 0.2 MG/ML IJ SOLN
0.2000 mg | Freq: Once | INTRAMUSCULAR | Status: AC
  Administered 2020-11-11: 0.2 mg via INTRAVENOUS

## 2020-11-11 MED ORDER — LIDOCAINE HCL (CARDIAC) PF 100 MG/5ML IV SOSY
PREFILLED_SYRINGE | INTRAVENOUS | Status: DC | PRN
  Administered 2020-11-11: 20 mg via INTRAVENOUS

## 2020-11-11 MED ORDER — PHENYLEPHRINE HCL (PRESSORS) 10 MG/ML IV SOLN
INTRAVENOUS | Status: DC | PRN
  Administered 2020-11-11 (×4): 100 ug via INTRAVENOUS

## 2020-11-11 MED ORDER — LACTATED RINGERS IV SOLN
Freq: Once | INTRAVENOUS | Status: AC
  Administered 2020-11-11: 1000 mL via INTRAVENOUS

## 2020-11-11 MED ORDER — GLYCOPYRROLATE 0.2 MG/ML IJ SOLN
INTRAMUSCULAR | Status: AC
  Filled 2020-11-11: qty 1

## 2020-11-11 MED ORDER — LIDOCAINE VISCOUS HCL 2 % MT SOLN
15.0000 mL | Freq: Once | OROMUCOSAL | Status: AC
  Administered 2020-11-11: 15 mL via OROMUCOSAL

## 2020-11-11 NOTE — Op Note (Signed)
[facility] Patient Name: Carolyn [name] Procedure Date: 11/11/2020 9:26 AM MRN: [id number] Date of Birth: [birth year] Attending MD: [name] ,  CSN: [id number] Age: 70 Admit Type: Outpatient Procedure:                Upper GI endoscopy Indications:              Epigastric abdominal pain Providers:                [name], Jeanann Lewandowsky. Sharon Seller, RN, Nelma Rothman, Technician Referring MD:              Medicines:                Monitored Anesthesia Care Complications:            No immediate complications. Estimated Blood Loss:     Estimated blood loss: none. Procedure:                Pre-Anesthesia Assessment:                           - Prior to the procedure, a History and Physical                            was performed, and patient medications, allergies                            and sensitivities were reviewed. The patient's                            tolerance of previous anesthesia was reviewed.                           - The risks and benefits of the procedure and the                            sedation options and risks were discussed with the                            patient. All questions were answered and informed                            consent was obtained.                           After obtaining informed consent, the endoscope was                            passed under direct vision. Throughout the                            procedure, the patient's blood pressure, pulse, and                            oxygen saturations were monitored continuously. The  [id number]-[id number] ([id number]) scope was introduced through the                            mouth, and advanced to the second part of duodenum.                            The upper GI endoscopy was accomplished without                            difficulty. The patient tolerated the procedure                            well. Scope In: 9:41:14 AM Scope  Out: 9:49:14 AM Total Procedure Duration: 0 hours 8 minutes 0 seconds  Findings:      The examined esophagus was normal.      A few localized small erosions with no bleeding and no stigmata of       recent bleeding were found in the gastric antrum. Biopsies were taken       with a cold forceps for Helicobacter pylori testing.      The examined duodenum was normal. Biopsies were taken with a cold       forceps for histology. Impression:               - Normal esophagus.                           - Erosive gastropathy with no bleeding and no                            stigmata of recent bleeding. Biopsied.                           - Normal examined duodenum. Biopsied. Moderate Sedation:      Per Anesthesia Care Recommendation:           - Discharge patient to home (ambulatory).                           - Resume previous diet.                           - Await pathology results.                           - Avoid taking aspirin 325 mg, ibuprofen, naproxen,                            or other non-steroidal anti-inflammatory drugs, if                            possible.                           - Use Prilosec (omeprazole) 40 mg PO daily for 3  months.                           - Check H. pylori serology. Procedure Code(s):        --- Professional ---                           [phone], GC, Esophagogastroduodenoscopy, flexible,                            transoral; with biopsy, single or multiple Diagnosis Code(s):        --- Professional ---                           K31.89, Other diseases of stomach and duodenum                           R10.13, Epigastric pain CPT copyright 2019 American Medical Association. All rights reserved. The codes documented in this report are preliminary and upon coder review may  be revised to meet current compliance requirements. [name], MD [name],  11/11/2020 10:18:17 AM This report has been signed  electronically. Number of Addenda: 0

## 2020-11-11 NOTE — Interval H&P Note (Signed)
History and Physical Interval Note:  11/11/2020 9:25 AM Carolyn [name] is a 70 y.o. female with PMH DM, depression HLD, MG and IBS-M, who comes to the hospital for evaluation of episodes of intermittent abdominal pain, as well as changes in her bowel movements.  Reports that she has had epigastric pain since October without any clear source.  Denies any nausea, vomiting, fever or chills, but has had intermittent longstanding history of constipation and diarrhea throughout her life.  She is currently taking MiraLAX and intermittently a low FODMAP diet.  No family history of colorectal cancer.  Last EGD: never Last Colonoscopy: 2015 - one 5 mm polyp in DC, two 4-5 mm sigmoid colon, internal hemorrhoids  BP (!) 154/82   Pulse 77   Temp 99.2 F (37.3 C) (Oral)   Resp 15   Ht 5\' 3"  (1.6 m)   Wt 87.5 kg   SpO2 100%   BMI 34.19 kg/m  GENERAL: The patient is AO x3, in no acute distress. Obese. HEENT: Head is normocephalic and atraumatic. EOMI are intact. Mouth is well hydrated and without lesions. NECK: Supple. No masses LUNGS: Clear to auscultation. No presence of rhonchi/wheezing/rales. Adequate chest expansion HEART: RRR, normal s1 and s2. ABDOMEN: Soft, nontender, no guarding, no peritoneal signs, and nondistended. BS +. No masses. EXTREMITIES: Without any cyanosis, clubbing, rash, lesions or edema. NEUROLOGIC: AOx3, no focal motor deficit. SKIN: no jaundice, no rashes   Carolyn [name]  has presented today for surgery, with the diagnosis of Epigastric pain, hx of colon polyps.  The various methods of treatment have been discussed with the patient and family. After consideration of risks, benefits and other options for treatment, the patient has consented to  Procedure(s) with comments: COLONOSCOPY WITH PROPOFOL (N/A) - 11:15 ESOPHAGOGASTRODUODENOSCOPY (EGD) WITH PROPOFOL (N/A) - to arrive at 0845 as a surgical intervention.  The patient's history has been reviewed, patient examined,  no change in status, stable for surgery.  I have reviewed the patient's chart and labs.  Questions were answered to the patient's satisfaction.     Carolyn [name]

## 2020-11-11 NOTE — Discharge Instructions (Signed)
You are being discharged to home.  Resume your previous diet.  We are waiting for your pathology results.  Do not take any aspirin 325 mg, ibuprofen (including Advil, Motrin or Nuprin), naproxen (including Aleve), or any other non-steroidal anti-inflammatory drugs, if possible.  Take Prilosec (omeprazole) 40 mg by mouth once a day for three months.  Your physician has recommended a repeat colonoscopy in 10 years for screening purposes.   Hemorrhoids Hemorrhoids are swollen veins that may develop:  In the butt (rectum). These are called internal hemorrhoids.  Around the opening of the butt (anus). These are called external hemorrhoids. Hemorrhoids can cause pain, itching, or bleeding. Most of the time, they do not cause serious problems. They usually get better with diet changes, lifestyle changes, and other home treatments. What are the causes? This condition may be caused by:  Having trouble pooping (constipation).  Pushing hard (straining) to poop.  Watery poop (diarrhea).  Pregnancy.  Being very overweight (obese).  Sitting for long periods of time.  Heavy lifting or other activity that causes you to strain.  Anal sex.  Riding a bike for a long period of time. What are the signs or symptoms? Symptoms of this condition include:  Pain.  Itching or soreness in the butt.  Bleeding from the butt.  Leaking poop.  Swelling in the area.  One or more lumps around the opening of your butt. How is this diagnosed? A doctor can often diagnose this condition by looking at the affected area. The doctor may also:  Do an exam that involves feeling the area with a gloved hand (digital rectal exam).  Examine the area inside your butt using a small tube (anoscope).  Order blood tests. This may be done if you have lost a lot of blood.  Have you get a test that involves looking inside the colon using a flexible tube with a camera on the end (sigmoidoscopy or colonoscopy). How  is this treated? This condition can usually be treated at home. Your doctor may tell you to change what you eat, make lifestyle changes, or try home treatments. If these do not help, procedures can be done to remove the hemorrhoids or make them smaller. These may involve:  Placing rubber bands at the base of the hemorrhoids to cut off their blood supply.  Injecting medicine into the hemorrhoids to shrink them.  Shining a type of light energy onto the hemorrhoids to cause them to fall off.  Doing surgery to remove the hemorrhoids or cut off their blood supply. Follow these instructions at home: Eating and drinking  Eat foods that have a lot of fiber in them. These include whole grains, beans, nuts, fruits, and vegetables.  Ask your doctor about taking products that have added fiber (fibersupplements).  Reduce the amount of fat in your diet. You can do this by: ? Eating low-fat dairy products. ? Eating less red meat. ? Avoiding processed foods.  Drink enough fluid to keep your pee (urine) pale yellow.   Managing pain and swelling  Take a warm-water bath (sitz bath) for 20 minutes to ease pain. Do this 3-4 times a day. You may do this in a bathtub or using a portable sitz bath that fits over the toilet.  If told, put ice on the painful area. It may be helpful to use ice between your warm baths. ? Put ice in a plastic bag. ? Place a towel between your skin and the bag. ? Leave the ice on  for 20 minutes, 2-3 times a day.   General instructions  Take over-the-counter and prescription medicines only as told by your doctor. ? Medicated creams and medicines may be used as told.  Exercise often. Ask your doctor how much and what kind of exercise is best for you.  Go to the bathroom when you have the urge to poop. Do not wait.  Avoid pushing too hard when you poop.  Keep your butt dry and clean. Use wet toilet paper or moist towelettes after pooping.  Do not sit on the toilet for a  long time.  Keep all follow-up visits as told by your doctor. This is important. Contact a doctor if you:  Have pain and swelling that do not get better with treatment or medicine.  Have trouble pooping.  Cannot poop.  Have pain or swelling outside the area of the hemorrhoids. Get help right away if you have:  Bleeding that will not stop. Summary  Hemorrhoids are swollen veins in the butt or around the opening of the butt.  They can cause pain, itching, or bleeding.  Eat foods that have a lot of fiber in them. These include whole grains, beans, nuts, fruits, and vegetables.  Take a warm-water bath (sitz bath) for 20 minutes to ease pain. Do this 3-4 times a day. This information is not intended to replace advice given to you by your health care provider. Make sure you discuss any questions you have with your health care provider. Document Revised: 10/25/2018 Document Reviewed: 03/08/2018 Elsevier Patient Education  2021 [place].  Upper Endoscopy, Adult, Care After This sheet gives you information about how to care for yourself after your procedure. Your health care provider may also give you more specific instructions. If you have problems or questions, contact your health care provider. What can I expect after the procedure? After the procedure, it is common to have:  A sore throat.  Mild stomach pain or discomfort.  Bloating.  Nausea. Follow these instructions at home:  Follow instructions from your health care provider about what to eat or drink after your procedure.  Return to your normal activities as told by your health care provider. Ask your health care provider what activities are safe for you.  Take over-the-counter and prescription medicines only as told by your health care provider.  If you were given a sedative during the procedure, it can affect you for several hours. Do not drive or operate machinery until your health care provider says that it is  safe.  Keep all follow-up visits as told by your health care provider. This is important.   Contact a health care provider if you have:  A sore throat that lasts longer than one day.  Trouble swallowing. Get help right away if:  You vomit blood or your vomit looks like coffee grounds.  You have: ? A fever. ? Bloody, black, or tarry stools. ? A severe sore throat or you cannot swallow. ? Difficulty breathing. ? Severe pain in your chest or abdomen. Summary  After the procedure, it is common to have a sore throat, mild stomach discomfort, bloating, and nausea.  If you were given a sedative during the procedure, it can affect you for several hours. Do not drive or operate machinery until your health care provider says that it is safe.  Follow instructions from your health care provider about what to eat or drink after your procedure.  Return to your normal activities as told by your health  care provider. This information is not intended to replace advice given to you by your health care provider. Make sure you discuss any questions you have with your health care provider. Document Revised: 10/15/2019 Document Reviewed: 03/19/2018 Elsevier Patient Education  2021 [place].  Colonoscopy, Adult, Care After This sheet gives you information about how to care for yourself after your procedure. Your doctor may also give you more specific instructions. If you have problems or questions, call your doctor. What can I expect after the procedure? After the procedure, it is common to have:  A small amount of blood in your poop (stool) for 24 hours.  Some gas.  Mild cramping or bloating in your belly (abdomen). Follow these instructions at home: Eating and drinking  Drink enough fluid to keep your pee (urine) pale yellow.  Follow instructions from your doctor about what you cannot eat or drink.  Return to your normal diet as told by your doctor. Avoid heavy or fried foods that are  hard to digest.   Activity  Rest as told by your doctor.  Do not sit for a long time without moving. Get up to take short walks every 1-2 hours. This is important. Ask for help if you feel weak or unsteady.  Return to your normal activities as told by your doctor. Ask your doctor what activities are safe for you. To help cramping and bloating:  Try walking around.  Put heat on your belly as told by your doctor. Use the heat source that your doctor recommends, such as a moist heat pack or a heating pad. ? Put a towel between your skin and the heat source. ? Leave the heat on for 20-30 minutes. ? Remove the heat if your skin turns bright red. This is very important if you are unable to feel pain, heat, or cold. You may have a greater risk of getting burned.   General instructions  If you were given a medicine to help you relax (sedative) during your procedure, it can affect you for many hours. Do not drive or use machinery until your doctor says that it is safe.  For the first 24 hours after the procedure: ? Do not sign important documents. ? Do not drink alcohol. ? Do your daily activities more slowly than normal. ? Eat foods that are soft and easy to digest.  Take over-the-counter or prescription medicines only as told by your doctor.  Keep all follow-up visits as told by your doctor. This is important. Contact a doctor if:  You have blood in your poop 2-3 days after the procedure. Get help right away if:  You have more than a small amount of blood in your poop.  You see large clumps of tissue (blood clots) in your poop.  Your belly is swollen.  You feel like you may vomit (nauseous).  You vomit.  You have a fever.  You have belly pain that gets worse, and medicine does not help your pain. Summary  After the procedure, it is common to have a small amount of blood in your poop. You may also have mild cramping and bloating in your belly.  If you were given a medicine  to help you relax (sedative) during your procedure, it can affect you for many hours. Do not drive or use machinery until your doctor says that it is safe.  Get help right away if you have a lot of blood in your poop, feel like you may vomit, have a  fever, or have more belly pain. This information is not intended to replace advice given to you by your health care provider. Make sure you discuss any questions you have with your health care provider. Document Revised: 08/23/2019 Document Reviewed: 05/13/2019 Elsevier Patient Education  [place].

## 2020-11-11 NOTE — Telephone Encounter (Signed)
LMTC x 1 - Will ov note from 06/16/20 qualify pt?

## 2020-11-11 NOTE — Telephone Encounter (Signed)
LCBM x2

## 2020-11-11 NOTE — Op Note (Signed)
[facility] Patient Name: Carolyn [name] Procedure Date: 11/11/2020 9:49 AM MRN: [id number] Date of Birth: 06-Jul-1951 Attending MD: [name] ,  CSN: [id number] Age: 70 Admit Type: Outpatient Procedure:                Colonoscopy Indications:              High risk colon cancer surveillance: Personal                            history of colonic polyps Providers:                [name], Jeanann Lewandowsky. Sharon Seller, RN, Nelma Rothman, Technician Referring MD:              Medicines:                Monitored Anesthesia Care Complications:            No immediate complications. Estimated Blood Loss:     Estimated blood loss: none. Procedure:                Pre-Anesthesia Assessment:                           - Prior to the procedure, a History and Physical                            was performed, and patient medications, allergies                            and sensitivities were reviewed. The patient's                            tolerance of previous anesthesia was reviewed.                           - The risks and benefits of the procedure and the                            sedation options and risks were discussed with the                            patient. All questions were answered and informed                            consent was obtained.                           - ASA Grade Assessment: II - A patient with mild                            systemic disease.                           After obtaining informed consent, the colonoscope  was passed under direct vision. Throughout the                            procedure, the patient's blood pressure, pulse, and                            oxygen saturations were monitored continuously. The                            [id number]) scope was introduced through                            the anus and advanced to the the cecum, identified                            by  appendiceal orifice and ileocecal valve. The                            colonoscopy was performed without difficulty. The                            patient tolerated the procedure well. The quality                            of the bowel preparation was good. Scope withdrawal                            time was 10 minutes. Scope In: 9:54:36 AM Scope Out: 10:10:40 AM Scope Withdrawal Time: 0 hours 11 minutes 47 seconds  Total Procedure Duration: 0 hours 16 minutes 4 seconds  Findings:      The perianal and digital rectal examinations were normal.      The colon (entire examined portion) appeared normal.      Non-bleeding internal hemorrhoids were found during retroflexion. The       hemorrhoids were small. Impression:               - The entire examined colon is normal.                           - Non-bleeding internal hemorrhoids.                           - No specimens collected. Moderate Sedation:      Per Anesthesia Care Recommendation:           - Discharge patient to home (ambulatory).                           - Resume previous diet.                           - Repeat colonoscopy in 10 years for screening                            purposes. Procedure Code(s):        --- Professional ---  G0105, Colorectal cancer screening; colonoscopy on                            individual at high risk Diagnosis Code(s):        --- Professional ---                           Z86.010, Personal history of colonic polyps                           K64.8, Other hemorrhoids CPT copyright 2019 American Medical Association. All rights reserved. The codes documented in this report are preliminary and upon coder review may  be revised to meet current compliance requirements. [name], MD [name],  11/11/2020 10:21:10 AM This report has been signed electronically. Number of Addenda: 0

## 2020-11-11 NOTE — Anesthesia Preprocedure Evaluation (Addendum)
Anesthesia Evaluation  Patient identified by MRN, date of birth, ID band Patient awake    Reviewed: Allergy & Precautions, NPO status , Patient's Chart, lab work & pertinent test results, reviewed documented beta blocker date and time   History of Anesthesia Complications Negative for: history of anesthetic complications  Airway Mallampati: II  TM Distance: >3 FB Neck ROM: Full    Dental  (+) Dental Advisory Given, Teeth Intact   Pulmonary shortness of breath and with exertion, sleep apnea and Continuous Positive Airway Pressure Ventilation ,    Pulmonary exam normal breath sounds clear to auscultation       Cardiovascular METS: 3 - Mets hypertension, Pt. on medications and Pt. on home beta blockers Normal cardiovascular exam Rhythm:Regular Rate:Normal  1. The left ventricle has low normal systolic function, with an ejection  fraction of 50-55%. The cavity size was normal. There is mild concentric  left ventricular hypertrophy. Left ventricular diastolic Doppler  parameters are consistent with impaired  relaxation No evidence of left ventricular regional wall motion  abnormalities.  2. The mitral valve is normal in structure.  3. The tricuspid valve is normal in structure.  4. The aortic valve is tricuspid.  5. No evidence of left ventricular regional wall motion abnormalities.    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    Reproductive/Obstetrics negative OB ROS                            Anesthesia Physical Anesthesia Plan  ASA: III  Anesthesia Plan: General   Post-op Pain Management:    Induction: Intravenous  PONV Risk Score and Plan: TIVA  Airway Management Planned: Nasal Cannula, Natural Airway and Simple Face Mask  Additional Equipment:   Intra-op Plan:   Post-operative Plan:   Informed Consent: I have reviewed the patients History and Physical, chart, labs and discussed the procedure including the risks, benefits and alternatives for the proposed anesthesia with the patient or authorized representative who has indicated his/her understanding and acceptance.     Dental advisory given  Plan Discussed with: CRNA and Surgeon  Anesthesia Plan Comments: (Patient is Jehovah's Witness)       Anesthesia Quick Evaluation

## 2020-11-11 NOTE — Anesthesia Postprocedure Evaluation (Signed)
Anesthesia Post Note  Patient: TINISHA ETZKORN  Procedure(s) Performed: COLONOSCOPY WITH PROPOFOL (N/A ) ESOPHAGOGASTRODUODENOSCOPY (EGD) WITH PROPOFOL (N/A ) BIOPSY  Patient location during evaluation: Endoscopy Anesthesia Type: General Level of consciousness: awake, oriented, awake and alert and patient cooperative Pain management: pain level controlled Vital Signs Assessment: post-procedure vital signs reviewed and stable Respiratory status: spontaneous breathing, nonlabored ventilation and respiratory function stable Cardiovascular status: blood pressure returned to baseline and stable Postop Assessment: no headache and no backache Anesthetic complications: no   No complications documented.   Last Vitals:  Vitals:   11/11/20 0919  BP: (!) 154/82  Pulse: 77  Resp: 15  Temp: 37.3 C  SpO2: 100%    Last Pain:  Vitals:   11/11/20 1013  TempSrc: Oral  PainSc: 0-No pain                 Tacy Learn

## 2020-11-11 NOTE — Transfer of Care (Signed)
Immediate Anesthesia Transfer of Care Note  Patient: Carolyn [name]  Procedure(s) Performed: COLONOSCOPY WITH PROPOFOL (N/A ) ESOPHAGOGASTRODUODENOSCOPY (EGD) WITH PROPOFOL (N/A ) BIOPSY  Patient Location: Endoscopy Unit  Anesthesia Type:General  Level of Consciousness: awake, alert , oriented and patient cooperative  Airway & Oxygen Therapy: Patient Spontanous Breathing  Post-op Assessment: Report given to RN, Post -op Vital signs reviewed and stable and Patient moving all extremities  Post vital signs: Reviewed and stable  Last Vitals:  Vitals Value Taken Time  BP    Temp    Pulse    Resp    SpO2      Last Pain:  Vitals:   11/11/20 1013  TempSrc: Oral  PainSc: 0-No pain      Patients Stated Pain Goal: 5 (82/50/53 9767)  Complications: No complications documented.

## 2020-11-12 ENCOUNTER — Other Ambulatory Visit: Payer: Self-pay

## 2020-11-12 LAB — H. PYLORI ANTIBODY, IGG: H Pylori IgG: 0.22 Index Value (ref 0.00–0.79)

## 2020-11-12 LAB — SURGICAL PATHOLOGY

## 2020-11-12 NOTE — Telephone Encounter (Signed)
Spoke with Mariann Laster. She stated that the OV from 06/16/20 would be ok as long as they mentioned the patient is still using a cpap machine and would continue benefiting from it. OV notes have been printed. Will fax to [phone] Merleen Nicely.

## 2020-11-18 ENCOUNTER — Encounter (INDEPENDENT_AMBULATORY_CARE_PROVIDER_SITE_OTHER): Payer: Self-pay | Admitting: *Deleted

## 2020-11-19 ENCOUNTER — Encounter (HOSPITAL_COMMUNITY): Payer: Self-pay | Admitting: Gastroenterology

## 2020-11-20 ENCOUNTER — Telehealth: Payer: Self-pay | Admitting: Pulmonary Disease

## 2020-11-20 DIAGNOSIS — G4733 Obstructive sleep apnea (adult) (pediatric): Secondary | ICD-10-CM

## 2020-11-20 NOTE — Telephone Encounter (Signed)
Called patient but call went straight to VM. Left message for her to call back.  

## 2020-11-20 NOTE — Telephone Encounter (Signed)
Called patient again, call went straight to VM. Left a message for her to call back.

## 2020-11-24 NOTE — Telephone Encounter (Signed)
Patient is returning phone call. Patient phone number is [phone].

## 2020-11-24 NOTE — Telephone Encounter (Signed)
Called and spoke with patient who states that there was a recent order sent into [facility] for her CPAP machine. Patient states that she has always had an auto cpap machine and the order that was sent in is not for that machine and she is wondering why she can't continue, she would like to keep auto cpap for settings.  Order:Okay to send order to [facility] to arrange for new CPAP machine at 7 cm H2O with heated humidity.  She will need ROV 3 months after she gets new machine.  Dr. Halford Chessman please advise

## 2020-11-25 NOTE — Telephone Encounter (Signed)
Spoke with the pt and have made aware of response per Dr Halford Chessman  I have placed order for new CPAP settings  Nothing further needed

## 2020-11-25 NOTE — Telephone Encounter (Signed)
Based on her download from June 18, 2019 to June 16, 2020 she was using CPAP with fixed setting of 7 cm H2O (please see phone note from June 23, 2020).   If she would prefer to use an auto CPAP machine, then that is fine.  Please change order to get her set up with auto CPAP 5 to 10 cm H2O with heated humidity.

## 2020-11-27 DIAGNOSIS — G4733 Obstructive sleep apnea (adult) (pediatric): Secondary | ICD-10-CM | POA: Diagnosis not present

## 2020-11-30 DIAGNOSIS — R1032 Left lower quadrant pain: Secondary | ICD-10-CM | POA: Diagnosis not present

## 2020-12-07 ENCOUNTER — Other Ambulatory Visit: Payer: Self-pay | Admitting: Family Medicine

## 2020-12-11 ENCOUNTER — Other Ambulatory Visit: Payer: Self-pay | Admitting: Family Medicine

## 2020-12-28 ENCOUNTER — Other Ambulatory Visit: Payer: Medicare Other

## 2020-12-28 ENCOUNTER — Other Ambulatory Visit: Payer: Self-pay

## 2020-12-28 DIAGNOSIS — G4733 Obstructive sleep apnea (adult) (pediatric): Secondary | ICD-10-CM | POA: Diagnosis not present

## 2020-12-28 DIAGNOSIS — Z13220 Encounter for screening for lipoid disorders: Secondary | ICD-10-CM

## 2020-12-28 DIAGNOSIS — Z136 Encounter for screening for cardiovascular disorders: Secondary | ICD-10-CM | POA: Diagnosis not present

## 2020-12-28 DIAGNOSIS — Z6836 Body mass index (BMI) 36.0-36.9, adult: Secondary | ICD-10-CM

## 2020-12-28 DIAGNOSIS — E1169 Type 2 diabetes mellitus with other specified complication: Secondary | ICD-10-CM

## 2020-12-28 DIAGNOSIS — I10 Essential (primary) hypertension: Secondary | ICD-10-CM

## 2020-12-29 LAB — COMPLETE METABOLIC PANEL WITH GFR
AG Ratio: 2.1 (calc) (ref 1.0–2.5)
ALT: 26 U/L (ref 6–29)
AST: 25 U/L (ref 10–35)
Albumin: 4.6 g/dL (ref 3.6–5.1)
Alkaline phosphatase (APISO): 79 U/L (ref 37–153)
BUN: 11 mg/dL (ref 7–25)
CO2: 20 mmol/L (ref 20–32)
Calcium: 9.3 mg/dL (ref 8.6–10.4)
Chloride: 106 mmol/L (ref 98–110)
Creat: 0.87 mg/dL (ref 0.50–0.99)
GFR, Est African American: 79 mL/min/{1.73_m2} (ref >=60)
GFR, Est Non African American: 68 mL/min/{1.73_m2} (ref >=60)
Globulin: 2.2 g/dL (calc) (ref 1.9–3.7)
Glucose, Bld: 140 mg/dL — ABNORMAL HIGH (ref 65–99)
Potassium: 4.1 mmol/L (ref 3.5–5.3)
Sodium: 140 mmol/L (ref 135–146)
Total Bilirubin: 0.8 mg/dL (ref 0.2–1.2)
Total Protein: 6.8 g/dL (ref 6.1–8.1)

## 2020-12-29 LAB — LIPID PANEL
Cholesterol: 223 mg/dL — ABNORMAL HIGH (ref <200)
HDL: 37 mg/dL — ABNORMAL LOW (ref >=50)
LDL Cholesterol (Calc): 130 mg/dL (calc) — ABNORMAL HIGH
Non-HDL Cholesterol (Calc): 186 mg/dL (calc) — ABNORMAL HIGH (ref <130)
Total CHOL/HDL Ratio: 6 (calc) — ABNORMAL HIGH (ref <5.0)
Triglycerides: 392 mg/dL — ABNORMAL HIGH (ref <150)

## 2020-12-29 LAB — CBC WITH DIFFERENTIAL/PLATELET
Absolute Monocytes: 554 cells/uL (ref 200–950)
Basophils Absolute: 49 cells/uL (ref 0–200)
Basophils Relative: 1 %
Eosinophils Absolute: 152 cells/uL (ref 15–500)
Eosinophils Relative: 3.1 %
HCT: 45.5 % — ABNORMAL HIGH (ref 35.0–45.0)
Hemoglobin: 15.7 g/dL — ABNORMAL HIGH (ref 11.7–15.5)
Lymphs Abs: 1828 cells/uL (ref 850–3900)
MCH: 32.1 pg (ref 27.0–33.0)
MCHC: 34.5 g/dL (ref 32.0–36.0)
MCV: 93 fL (ref 80.0–100.0)
MPV: 9.6 fL (ref 7.5–12.5)
Monocytes Relative: 11.3 %
Neutro Abs: 2318 cells/uL (ref 1500–7800)
Neutrophils Relative %: 47.3 %
Platelets: 236 10*3/uL (ref 140–400)
RBC: 4.89 10*6/uL (ref 3.80–5.10)
RDW: 12 % (ref 11.0–15.0)
Total Lymphocyte: 37.3 %
WBC: 4.9 10*3/uL (ref 3.8–10.8)

## 2020-12-29 LAB — HEMOGLOBIN A1C
Hgb A1c MFr Bld: 6.8 % of total Hgb — ABNORMAL HIGH (ref <5.7)
Mean Plasma Glucose: 148 mg/dL
eAG (mmol/L): 8.2 mmol/L

## 2021-01-01 ENCOUNTER — Other Ambulatory Visit: Payer: Self-pay

## 2021-01-01 ENCOUNTER — Encounter: Payer: Self-pay | Admitting: Family Medicine

## 2021-01-01 ENCOUNTER — Ambulatory Visit (INDEPENDENT_AMBULATORY_CARE_PROVIDER_SITE_OTHER): Payer: Medicare Other | Admitting: Family Medicine

## 2021-01-01 VITALS — BP 124/80 | HR 98 | Temp 98.2°F | Resp 14 | Ht 63.0 in | Wt 197.0 lb

## 2021-01-01 DIAGNOSIS — F339 Major depressive disorder, recurrent, unspecified: Secondary | ICD-10-CM

## 2021-01-01 DIAGNOSIS — I10 Essential (primary) hypertension: Secondary | ICD-10-CM

## 2021-01-01 DIAGNOSIS — K760 Fatty (change of) liver, not elsewhere classified: Secondary | ICD-10-CM

## 2021-01-01 DIAGNOSIS — E1169 Type 2 diabetes mellitus with other specified complication: Secondary | ICD-10-CM

## 2021-01-01 DIAGNOSIS — I5189 Other ill-defined heart diseases: Secondary | ICD-10-CM

## 2021-01-01 DIAGNOSIS — E782 Mixed hyperlipidemia: Secondary | ICD-10-CM

## 2021-01-01 DIAGNOSIS — G4733 Obstructive sleep apnea (adult) (pediatric): Secondary | ICD-10-CM

## 2021-01-01 MED ORDER — LIVALO 2 MG PO TABS: ORAL_TABLET | ORAL | 3 refills | Status: DC

## 2021-01-01 NOTE — Progress Notes (Signed)
   Subjective:    Patient ID: Carolyn [name], female    DOB: 04-10-1951, 70 y.o.   MRN: [id number]  Patient presents for Follow-up (Has had labs/)  OSA- using CPAP , Dr. Halford Chessman   She has had Colonoscopy, EGD had erosive gastritis, given omeprazol e 40mg  once a day for 3 months  Colonosocpy showed internal hemorroids, he has ahd constipation- using magnesium supplement and prunes  To help   Dr. Zadie Rhine is following her eye condition for optic nerve   Livalo has not been refilled so she hasnt been taking and lipids have increased, she would like to restart  DM- A1C 6.8% , no hypoglycemia symptoms , taking farxiga  HTN taking metoprolol  And losartan   Review Of Systems:  GEN- denies fatigue, fever, weight loss,weakness, recent illness HEENT- denies eye drainage, change in vision, nasal discharge, CVS- denies chest pain, palpitations RESP- denies SOB, cough, wheeze ABD- denies N/V, change in stools, abd pain GU- denies dysuria, hematuria, dribbling, incontinence MSK- denies joint pain, muscle aches, injury Neuro- denies headache, dizziness, syncope, seizure activity       Objective:    BP 124/80   Pulse 98   Temp 98.2 F (36.8 C) (Temporal)   Resp 14   Ht 5\' 3"  (1.6 m)   Wt 197 lb (89.4 kg)   SpO2 98%   BMI 34.90 kg/m  GEN- NAD, alert and oriented x3 HEENT- PERRL, EOMI, non injected sclera, pink conjunctiva, MMM, oropharynx clear Neck- Supple, no thyromegaly CVS- RRR, no murmur RESP-CTAB ABD-NABS,soft,NT,ND EXT- No edema , no lesions on feet  Pulses- Radial, DP- 2+        Assessment & Plan:     Reviewed recent fasting labs at bedside  Problem List Items Addressed This Visit      Unprioritized   Depression, recurrent (HCC)    Controlled with wellbutrin , no changes       Relevant Medications   WELLBUTRIN XL 150 MG 24 hr tablet   Diabetes mellitus, type II (HCC)    Controlled no chanes       Relevant Medications   Pitavastatin Calcium (LIVALO) 2 MG  TABS   losartan (COZAAR) 100 MG tablet   dapagliflozin propanediol (FARXIGA) 10 MG TABS tablet   Diastolic dysfunction    Compensated, no changes, on Beta blocker       Fatty liver    Restart Livalo       Hyperlipidemia   Relevant Medications   Pitavastatin Calcium (LIVALO) 2 MG TABS   losartan (COZAAR) 100 MG tablet   metoprolol succinate (TOPROL-XL) 50 MG 24 hr tablet   Hypertension - Primary    Controlled , no changes       Relevant Medications   Pitavastatin Calcium (LIVALO) 2 MG TABS   losartan (COZAAR) 100 MG tablet   metoprolol succinate (TOPROL-XL) 50 MG 24 hr tablet   Sleep apnea    On CPAP therapy          Note: This dictation was prepared with Dragon dictation along with smaller phrase technology. Any transcriptional errors that result from this process are unintentional.

## 2021-01-01 NOTE — Patient Instructions (Addendum)
We will check on the livalo, I have sent to pharmacy again

## 2021-01-04 ENCOUNTER — Encounter: Payer: Self-pay | Admitting: Family Medicine

## 2021-01-04 MED ORDER — DAPAGLIFLOZIN PROPANEDIOL 10 MG PO TABS: 10.0000 mg | ORAL_TABLET | Freq: Every day | ORAL | 2 refills | Status: DC

## 2021-01-04 MED ORDER — LOSARTAN POTASSIUM 100 MG PO TABS: 100.0000 mg | ORAL_TABLET | Freq: Every day | ORAL | 3 refills | Status: DC

## 2021-01-04 MED ORDER — METOPROLOL SUCCINATE ER 50 MG PO TB24: ORAL_TABLET | ORAL | 3 refills | Status: DC

## 2021-01-04 MED ORDER — WELLBUTRIN XL 150 MG PO TB24: ORAL_TABLET | ORAL | 3 refills | Status: DC

## 2021-01-04 MED ORDER — OMEPRAZOLE 40 MG PO CPDR: 40.0000 mg | DELAYED_RELEASE_CAPSULE | Freq: Every day | ORAL | 1 refills | Status: DC

## 2021-01-04 NOTE — Assessment & Plan Note (Signed)
Controlled with wellbutrin , no changes

## 2021-01-04 NOTE — Assessment & Plan Note (Signed)
Restart Livalo

## 2021-01-04 NOTE — Assessment & Plan Note (Signed)
On CPAP therapy

## 2021-01-04 NOTE — Assessment & Plan Note (Signed)
Compensated, no changes, on Beta blocker

## 2021-01-04 NOTE — Assessment & Plan Note (Signed)
Controlled no chanes

## 2021-01-04 NOTE — Assessment & Plan Note (Signed)
Controlled, no changes. 

## 2021-01-18 ENCOUNTER — Ambulatory Visit (INDEPENDENT_AMBULATORY_CARE_PROVIDER_SITE_OTHER): Payer: Medicare Other | Admitting: Gastroenterology

## 2021-01-18 ENCOUNTER — Encounter (INDEPENDENT_AMBULATORY_CARE_PROVIDER_SITE_OTHER): Payer: Self-pay | Admitting: Gastroenterology

## 2021-01-18 ENCOUNTER — Other Ambulatory Visit: Payer: Self-pay

## 2021-01-18 VITALS — BP 141/86 | HR 80 | Temp 98.2°F | Ht 63.0 in | Wt 200.0 lb

## 2021-01-18 DIAGNOSIS — K582 Mixed irritable bowel syndrome: Secondary | ICD-10-CM | POA: Diagnosis not present

## 2021-01-18 NOTE — Progress Notes (Signed)
Carolyn [name], M.D. Gastroenterology & Hepatology [facility] [address] [place], [place] [address]  Primary [place]: [place] [address] [place] [address]  I will communicate my assessment and recommendations to the referring MD via EMR.  Problems: 1. Irritable bowel syndrome - mixed  History of Present Illness: Carolyn [name] is a 70 y.o. female with PMH DM, depression HLD, MG and IBS, who presents for follow up of constipation.  The patient was last seen on 10/14/2020. At that time, the patient was ordered to have an EGD and colonoscopy.  She was counseled to implement a low FODMAP diet, was started on Bentyl as needed for abdominal pain and she was counseled to start taking MiraLAX for her constipation.  After recent esophagogastroduodenospy, she was prescribed Prilosec 40 mg daily for 3 months.She is about to finish her Prilosec course.  Patient reports that she is trying to follow dietary changes in her low FODMAP diet but states "sometimes it difficult to follow it". Not taking any Miralax anymore as she has been able to have bowel movements with her current diet. Has a Bm almost every day. Denies having any bloating or abdominal pain.  Reports she is feeling better than prior. The patient denies having any nausea, vomiting, fever, chills, hematochezia, melena, hematemesis, abdominal distention, abdominal pain, diarrhea, jaundice, pruritus or weight loss.  Last EGD: 11/11/2020, there was presence of some gastric erosions in the antrum (biopsies taken to rule out H. pylori which were negative, no presence of GIM or dysplasia), normal duodenum (negative for celiac disease). Last Colonoscopy: 11/11/2020, normal colon with presence of internal hemorrhoids.  Past Medical History: Past Medical History:  Diagnosis Date  . Depression   . Diabetes mellitus without complication ([place])   . Diastolic  dysfunction   . Hyperlipidemia   . Myasthenia gravis ([place])   . Patient is Jehovah's Witness   . Sleep apnea     Past Surgical History: Past Surgical History:  Procedure Laterality Date  . APPENDECTOMY    . BIOPSY  11/11/2020   Procedure: BIOPSY;  Surgeon: Harvel Quale, MD;  [facility];  Service: Gastroenterology;;  duodenum gastric  . BUNIONECTOMY    . COLONOSCOPY WITH PROPOFOL N/A 11/11/2020   Procedure: COLONOSCOPY WITH PROPOFOL;  Surgeon: Harvel Quale, MD;  [facility];  Service: Gastroenterology;  Laterality: N/A;  11:15  . ESOPHAGOGASTRODUODENOSCOPY (EGD) WITH PROPOFOL N/A 11/11/2020   Procedure: ESOPHAGOGASTRODUODENOSCOPY (EGD) WITH PROPOFOL;  Surgeon: Harvel Quale, MD;  [facility];  Service: Gastroenterology;  Laterality: N/A;  to arrive at [place]  . MANDIBLE SURGERY    . TONSILECTOMY, ADENOIDECTOMY, BILATERAL MYRINGOTOMY AND TUBES      Family History: Family History  Problem Relation Age of Onset  . Arthritis Mother   . Depression Mother   . Diabetes Mother   . Hypertension Mother   . Alcohol abuse Father   . Hypertension Father   . Heart disease Father 32       Massive MI  . Diabetes Brother   . Diabetes Brother   . Heart disease Brother   . Diabetes Brother        Type I     Social History: Social History   Tobacco Use  Smoking Status Never Smoker  Smokeless Tobacco Never Used   Social History   Substance and Sexual Activity  Alcohol Use Yes   Comment: socially  Social History   Substance and Sexual Activity  Drug Use No    Allergies: Allergies  Allergen Reactions  . Prozac [Fluoxetine Hcl] Anaphylaxis  . Codeine Other (See Comments)    Intolerance   . Other     -mycin medications: has myasthenia gravis   . Sulfa Antibiotics Hives    Medications: Current Outpatient Medications  Medication Sig Dispense Refill  . Blood Glucose Monitoring Suppl (BLOOD GLUCOSE  SYSTEM PAK) KIT Use as directed to monitor FSBS 2x daily. Dx: E11.9 1 each 1  . Cholecalciferol (VITAMIN D3) 125 MCG (5000 UT) TABS Take 5,000 Units by mouth daily.    . dapagliflozin propanediol (FARXIGA) 10 MG TABS tablet Take 1 tablet (10 mg total) by mouth daily before breakfast. 90 tablet 2  . Glycerin-Hypromellose-PEG 400 (DRY EYE RELIEF DROPS) 0.2-0.2-1 % SOLN Place 1 drop into both eyes daily as needed (Dry eye).    Marland Kitchen icosapent Ethyl (VASCEPA) 1 g capsule TAKE 2 CAPSULES BY MOUTH TWICE DAILY WITH A MEAL 120 capsule 3  . losartan (COZAAR) 100 MG tablet Take 1 tablet (100 mg total) by mouth daily. 90 tablet 3  . metoprolol succinate (TOPROL-XL) 50 MG 24 hr tablet TAKE 1 TABLET BY MOUTH DAILY 90 tablet 3  . Multiple Vitamins-Minerals (MULTI COMPLETE) CAPS Take 1 capsule by mouth daily.    Marland Kitchen omeprazole (PRILOSEC) 40 MG capsule Take 1 capsule (40 mg total) by mouth daily. 90 capsule 1  . ONETOUCH VERIO test strip USE TO TEST BLOOD SUGAR TWICE DAILY 100 strip 3  . Pitavastatin Calcium (LIVALO) 2 MG TABS Take 1 tablet at bedtime for cholesterol 30 tablet 3  . WELLBUTRIN XL 150 MG 24 hr tablet TAKE 1 TABLET BY MOUTH ONCE DAILY 90 tablet 3  . dicyclomine (BENTYL) 10 MG capsule Take 1 capsule (10 mg total) by mouth every 12 (twelve) hours as needed for spasms. 60 capsule 1  . HYDROcodone-acetaminophen (NORCO/VICODIN) 5-325 MG tablet Take 1 tablet by mouth every 6 (six) hours as needed for moderate pain. (Patient not taking: Reported on 01/18/2021)    . magnesium gluconate (MAGONATE) 500 MG tablet Take 500 mg by mouth daily. (Patient not taking: Reported on 01/18/2021)     No current facility-administered medications for this visit.    Review of Systems: GENERAL: negative for malaise, night sweats HEENT: No changes in hearing or vision, no nose bleeds or other nasal problems. NECK: Negative for lumps, goiter, pain and significant neck swelling RESPIRATORY: Negative for cough,  wheezing CARDIOVASCULAR: Negative for chest pain, leg swelling, palpitations, orthopnea GI: SEE HPI MUSCULOSKELETAL: Negative for joint pain or swelling, back pain, and muscle pain. SKIN: Negative for lesions, rash PSYCH: Negative for sleep disturbance, mood disorder and recent psychosocial stressors. HEMATOLOGY Negative for prolonged bleeding, bruising easily, and swollen nodes. ENDOCRINE: Negative for cold or heat intolerance, polyuria, polydipsia and goiter. NEURO: negative for tremor, gait imbalance, syncope and seizures. The remainder of the review of systems is noncontributory.   Physical Exam: BP (!) 141/86 (BP Location: Left Arm, Patient Position: Sitting, Cuff Size: Large)   Pulse 80   Temp 98.2 F (36.8 C) (Oral)   Ht $R'5\' 3"'OM$  (1.6 m)   Wt 200 lb (90.7 kg)   BMI 35.43 kg/m  GENERAL: The patient is AO x3, in no acute distress. HEENT: Head is normocephalic and atraumatic. EOMI are intact. Mouth is well hydrated and without lesions. NECK: Supple. No masses LUNGS: Clear to auscultation. No presence of rhonchi/wheezing/rales. Adequate chest expansion  HEART: RRR, normal s1 and s2. ABDOMEN: Soft, nontender, no guarding, no peritoneal signs, and nondistended. BS +. No masses. EXTREMITIES: Without any cyanosis, clubbing, rash, lesions or edema. NEUROLOGIC: AOx3, no focal motor deficit. SKIN: no jaundice, no rashes  Imaging/Labs: as above  I personally reviewed and interpreted the available labs, imaging and endoscopic files.  Impression and Plan: Carolyn [name] is a 70 y.o. female with PMH DM, depression HLD, MG and IBS, who presents for follow up of constipation.  Patient has presented major improvement in her symptoms after following dietary changes.  I explained to the patient that if she does not need to take any medications to control her bloating and constipation, she does not need to take anything on a daily basis.  She can still take MiraLAX if she has any recurrent  symptoms but for now she needs to continue her current dietary regimen.  She is due for repeat colonoscopy in 2031.  -Continue current dietary changes - Can take MiraLAX as needed if you present recurrent constipation  All questions were answered.      Harvel Quale, MD Gastroenterology and Hepatology [facility]

## 2021-01-18 NOTE — Patient Instructions (Signed)
Continue current dietary changes Can take MiraLAX as needed if you present recurrent constipation

## 2021-01-26 ENCOUNTER — Ambulatory Visit: Payer: Medicare Other | Admitting: Physical Therapy

## 2021-01-28 ENCOUNTER — Encounter: Payer: Self-pay | Admitting: Internal Medicine

## 2021-01-28 ENCOUNTER — Ambulatory Visit (INDEPENDENT_AMBULATORY_CARE_PROVIDER_SITE_OTHER): Payer: Medicare Other | Admitting: Internal Medicine

## 2021-01-28 ENCOUNTER — Other Ambulatory Visit: Payer: Self-pay

## 2021-01-28 VITALS — BP 136/82 | HR 92 | Resp 18 | Ht 63.0 in | Wt 200.4 lb

## 2021-01-28 DIAGNOSIS — I10 Essential (primary) hypertension: Secondary | ICD-10-CM

## 2021-01-28 DIAGNOSIS — E1169 Type 2 diabetes mellitus with other specified complication: Secondary | ICD-10-CM | POA: Diagnosis not present

## 2021-01-28 DIAGNOSIS — Z7689 Persons encountering health services in other specified circumstances: Secondary | ICD-10-CM | POA: Diagnosis not present

## 2021-01-28 DIAGNOSIS — G4733 Obstructive sleep apnea (adult) (pediatric): Secondary | ICD-10-CM | POA: Diagnosis not present

## 2021-01-28 DIAGNOSIS — Z789 Other specified health status: Secondary | ICD-10-CM | POA: Diagnosis not present

## 2021-01-28 DIAGNOSIS — K582 Mixed irritable bowel syndrome: Secondary | ICD-10-CM | POA: Diagnosis not present

## 2021-01-28 DIAGNOSIS — E782 Mixed hyperlipidemia: Secondary | ICD-10-CM

## 2021-01-28 DIAGNOSIS — F339 Major depressive disorder, recurrent, unspecified: Secondary | ICD-10-CM

## 2021-01-28 DIAGNOSIS — IMO0001 Reserved for inherently not codable concepts without codable children: Secondary | ICD-10-CM

## 2021-01-28 NOTE — Assessment & Plan Note (Signed)
Diet modification and moderate exercise advised. 

## 2021-01-28 NOTE — Assessment & Plan Note (Signed)
Lab Results  Component Value Date   HGBA1C 6.8 (H) 12/28/2020   On Farxiga, did not tolerate Metformin Advised to follow diabetic diet On ARB and statin F/u CMP and lipid panel Diabetic foot exam: Today Diabetic eye exam: Advised to follow up with Ophthalmology for diabetic eye exam

## 2021-01-28 NOTE — Assessment & Plan Note (Signed)
Uses CPAP regularly °

## 2021-01-28 NOTE — Assessment & Plan Note (Signed)
On statin and Vascepa

## 2021-01-28 NOTE — Patient Instructions (Signed)
Please continue taking medications as prescribed.  Please continue to follow low carbohydrate diet and perform moderate exercise/walking at least 150 mins/week.

## 2021-01-28 NOTE — Progress Notes (Signed)
New Patient Office Visit  Subjective:  Patient ID: Carolyn [name], female    DOB: 1951-06-22  Age: 70 y.o. MRN: [id number]  CC:  Chief Complaint  Patient presents with  . New Patient (Initial Visit)    New patient former dr Buelah Manis pt just establishing care pt is due to get eye exam in April with dr [name] -    HPI Carolyn [name] is a 70 year old female with PMH of HTN, OSA, IBS, DM, HLD, depression and obesity who presents for establishing care.  She is a former patient of Dr. Buelah Manis.  She has been doing well overall.  HTN: BP is well-controlled. Takes medications regularly. Patient denies headache, dizziness, chest pain, dyspnea or palpitations.  DM: Her HbA1c was 6.8.  She has been taking [place].  She checks her blood glucose regularly.  She denies any polyuria or polydipsia.  OSA: Uses CPAP regularly.  She follows up with ophthalmology for diabetic eye exam.  She has history of hypertensive retinopathy, for which she was advised to take aspirin as prophylaxis.  She has history of IBS, for which she follows up with GI.  She has history of depression, for which she takes Wellbutrin (brand name).  She has recently stopped taking it and has been trying homeopathic medicine.  She is up-to-date with COVID vaccine.  She prefers not to take any other vaccines.  She is Jehovah's witness.  Past Medical History:  Diagnosis Date  . Depression   . Diabetes mellitus without complication ([place])   . Diastolic dysfunction   . Hyperlipidemia   . Myasthenia gravis ([place])   . Patient is Jehovah's Witness   . Sleep apnea     Past Surgical History:  Procedure Laterality Date  . APPENDECTOMY    . BIOPSY  11/11/2020   Procedure: BIOPSY;  Surgeon: Harvel Quale, MD;  [facility];  Service: Gastroenterology;;  duodenum gastric  . BUNIONECTOMY    . COLONOSCOPY WITH PROPOFOL N/A 11/11/2020   Procedure: COLONOSCOPY WITH PROPOFOL;  Surgeon: Harvel Quale,  MD;  [facility];  Service: Gastroenterology;  Laterality: N/A;  11:15  . ESOPHAGOGASTRODUODENOSCOPY (EGD) WITH PROPOFOL N/A 11/11/2020   Procedure: ESOPHAGOGASTRODUODENOSCOPY (EGD) WITH PROPOFOL;  Surgeon: Harvel Quale, MD;  [facility];  Service: Gastroenterology;  Laterality: N/A;  to arrive at [place]  . MANDIBLE SURGERY    . TONSILECTOMY, ADENOIDECTOMY, BILATERAL MYRINGOTOMY AND TUBES      Family History  Problem Relation Age of Onset  . Arthritis Mother   . Depression Mother   . Diabetes Mother   . Hypertension Mother   . Alcohol abuse Father   . Hypertension Father   . Heart disease Father 34       Massive MI  . Diabetes Brother   . Diabetes Brother   . Heart disease Brother   . Diabetes Brother        Type I     Social History   Socioeconomic History  . Marital status: Married    Spouse name: Legrand Como  . Number of children: 4  . Years of education: some college  . Highest education level: Some college, no degree  Occupational History  . Occupation: Retired  Tobacco Use  . Smoking status: Never Smoker  . Smokeless tobacco: Never Used  Vaping Use  . Vaping Use: Never used  Substance and Sexual Activity  . Alcohol use: Yes    Comment: socially  . Drug use: No  .  Sexual activity: Yes    Birth control/protection: Other-see comments    Comment: postmenopausal  Other Topics Concern  . Not on file  Social History Narrative  . Not on file   Social Determinants of Health   Financial Resource Strain: Low Risk   . Difficulty of Paying Living Expenses: Not very hard  Food Insecurity: Not on file  Transportation Needs: Not on file  Physical Activity: Not on file  Stress: Not on file  Social Connections: Not on file  Intimate Partner Violence: Not on file    ROS Review of Systems  Constitutional: Negative for chills and fever.  HENT: Negative for congestion, sinus pressure, sinus pain and sore throat.   Eyes: Negative for  pain and discharge.  Respiratory: Negative for cough and shortness of breath.   Cardiovascular: Negative for chest pain and palpitations.  Gastrointestinal: Negative for abdominal pain, constipation, diarrhea, nausea and vomiting.  Endocrine: Negative for polydipsia and polyuria.  Genitourinary: Negative for dysuria and hematuria.  Musculoskeletal: Negative for neck pain and neck stiffness.  Skin: Negative for rash.  Neurological: Negative for dizziness and weakness.  Psychiatric/Behavioral: Negative for agitation and behavioral problems.    Objective:   Today's Vitals: BP 136/82 (BP Location: Right Arm, Patient Position: Sitting, Cuff Size: Normal)   Pulse 92   Resp 18   Ht _0  (1.6 m)   Wt 200 lb 6.4 oz (90.9 kg)   SpO2 97%   BMI 35.50 kg/m   Physical Exam Vitals reviewed.  Constitutional:      General: She is not in acute distress.    Appearance: She is not diaphoretic.  HENT:     Head: Normocephalic and atraumatic.     Nose: Nose normal.     Mouth/Throat:     Mouth: Mucous membranes are moist.  Eyes:     General: No scleral icterus.    Extraocular Movements: Extraocular movements intact.  Cardiovascular:     Rate and Rhythm: Normal rate and regular rhythm.     Pulses: Normal pulses.     Heart sounds: No murmur heard.   Pulmonary:     Breath sounds: Normal breath sounds. No wheezing or rales.  Abdominal:     Palpations: Abdomen is soft.     Tenderness: There is no abdominal tenderness.  Musculoskeletal:     Cervical back: Neck supple. No tenderness.     Right lower leg: No edema.     Left lower leg: No edema.  Skin:    General: Skin is warm.     Findings: No rash.  Neurological:     General: No focal deficit present.     Mental Status: She is alert and oriented to person, place, and time.     Sensory: No sensory deficit.     Motor: No weakness.  Psychiatric:        Mood and Affect: Mood normal.        Behavior: Behavior normal.     Assessment &  Plan:   Problem List Items Addressed This Visit      Encounter to establish care   Care established Previous chart reviewed History and medications reviewed with the patient  Cardiovascular and Mediastinum   Hypertension - Primary    BP Readings from Last 1 Encounters:  01/28/21 136/82   Well-controlled with Losartan and Metoprolol Counseled for compliance with the medications Advised DASH diet and moderate exercise/walking, at least 150 mins/week         Respiratory  Sleep apnea    Uses CPAP regularly        Digestive   IBS (irritable bowel syndrome)    Follows up with GI Dicyclomine PRN for abdominal spasms        Endocrine   Diabetes mellitus, type II ([place])    Lab Results  Component Value Date   HGBA1C 6.8 (H) 12/28/2020   On Farxiga, did not tolerate Metformin Advised to follow diabetic diet On ARB and statin F/u CMP and lipid panel Diabetic foot exam: Today Diabetic eye exam: Advised to follow up with Ophthalmology for diabetic eye exam         Other   Depression, recurrent ([place])    Was on Wellbutrin Has been trying Homeopathic medication, unsure of the name      Obesity    Diet modification and moderate exercise advised      Hyperlipidemia    On statin and Vascepa      Patient is Jehovah's Witness    Other Visit Diagnoses            Outpatient Encounter Medications as of 01/28/2021  Medication Sig  . Blood Glucose Monitoring Suppl (BLOOD GLUCOSE SYSTEM PAK) KIT Use as directed to monitor FSBS 2x daily. Dx: E11.9  . Cholecalciferol (VITAMIN D3) 125 MCG (5000 UT) TABS Take 5,000 Units by mouth daily.  . dapagliflozin propanediol (FARXIGA) 10 MG TABS tablet Take 1 tablet (10 mg total) by mouth daily before breakfast.  . dicyclomine (BENTYL) 10 MG capsule Take 1 capsule (10 mg total) by mouth every 12 (twelve) hours as needed for spasms.  . Glycerin-Hypromellose-PEG 400 (DRY EYE RELIEF DROPS) 0.2-0.2-1 % SOLN Place 1 drop into both eyes  daily as needed (Dry eye).  Marland Kitchen icosapent Ethyl (VASCEPA) 1 g capsule TAKE 2 CAPSULES BY MOUTH TWICE DAILY WITH A MEAL  . losartan (COZAAR) 100 MG tablet Take 1 tablet (100 mg total) by mouth daily.  . metoprolol succinate (TOPROL-XL) 50 MG 24 hr tablet TAKE 1 TABLET BY MOUTH DAILY  . Multiple Vitamins-Minerals (MULTI COMPLETE) CAPS Take 1 capsule by mouth daily.  Glory Rosebush VERIO test strip USE TO TEST BLOOD SUGAR TWICE DAILY  . Pitavastatin Calcium (LIVALO) 2 MG TABS Take 1 tablet at bedtime for cholesterol  . WELLBUTRIN XL 150 MG 24 hr tablet TAKE 1 TABLET BY MOUTH ONCE DAILY (Patient not taking: Reported on 01/28/2021)  . [DISCONTINUED] HYDROcodone-acetaminophen (NORCO/VICODIN) 5-325 MG tablet Take 1 tablet by mouth every 6 (six) hours as needed for moderate pain. (Patient not taking: No sig reported)  . [DISCONTINUED] magnesium gluconate (MAGONATE) 500 MG tablet Take 500 mg by mouth daily. (Patient not taking: Reported on 01/18/2021)  . [DISCONTINUED] omeprazole (PRILOSEC) 40 MG capsule Take 1 capsule (40 mg total) by mouth daily. (Patient not taking: Reported on 01/28/2021)   No facility-administered encounter medications on file as of 01/28/2021.    Follow-up: Return in about 6 months (around 07/30/2021) for HTN and DM.   Lindell Spar, MD

## 2021-01-28 NOTE — Assessment & Plan Note (Signed)
Follows up with GI Dicyclomine PRN for abdominal spasms

## 2021-01-28 NOTE — Assessment & Plan Note (Addendum)
BP Readings from Last 1 Encounters:  01/28/21 136/82   Well-controlled with Losartan and Metoprolol Counseled for compliance with the medications Advised DASH diet and moderate exercise/walking, at least 150 mins/week

## 2021-01-28 NOTE — Assessment & Plan Note (Signed)
Was on Wellbutrin Has been trying Homeopathic medication, unsure of the name

## 2021-02-09 NOTE — Progress Notes (Signed)
Subjective:   Carolyn [name] is a 70 y.o. female who presents for Medicare Annual (Subsequent) preventive examination.  I connected with Reginald Mangels today by telephone and verified that I am speaking with the correct person using two identifiers. Location patient: home Location provider: work Persons participating in the virtual visit: patient, provider.   I discussed the limitations, risks, security and privacy concerns of performing an evaluation and management service by telephone and the availability of in person appointments. I also discussed with the patient that there may be a patient responsible charge related to this service. The patient expressed understanding and verbally consented to this telephonic visit.    Interactive audio and video telecommunications were attempted between this provider and patient, however failed, due to patient having technical difficulties OR patient did not have access to video capability.  We continued and completed visit with audio only.      Review of Systems    N/A  Cardiac Risk Factors include: advanced age (>72men, >42 women);hypertension;diabetes mellitus;dyslipidemia     Objective:    Today's Vitals   02/10/21 1308  PainSc: 0-No pain   There is no height or weight on file to calculate BMI.  Advanced Directives 02/10/2021 11/11/2020 11/28/2018  Does Patient Have a Medical Advance Directive? Yes Yes Yes  Type of Paramedic of [place];Living will [place];Living will -  Does patient want to make changes to medical advance directive? No - Patient declined - -  Copy of [place] in Chart? Yes - validated most recent copy scanned in chart (See row information) - -    Current Medications (verified) Outpatient Encounter Medications as of 02/10/2021  Medication Sig  . Blood Glucose Monitoring Suppl (BLOOD GLUCOSE SYSTEM PAK) KIT Use as directed to monitor FSBS 2x daily. Dx:  E11.9  . Cholecalciferol (VITAMIN D3) 125 MCG (5000 UT) TABS Take 5,000 Units by mouth daily.  . dapagliflozin propanediol (FARXIGA) 10 MG TABS tablet Take 1 tablet (10 mg total) by mouth daily before breakfast.  . Glycerin-Hypromellose-PEG 400 (DRY EYE RELIEF DROPS) 0.2-0.2-1 % SOLN Place 1 drop into both eyes daily as needed (Dry eye).  Marland Kitchen icosapent Ethyl (VASCEPA) 1 g capsule TAKE 2 CAPSULES BY MOUTH TWICE DAILY WITH A MEAL  . losartan (COZAAR) 100 MG tablet Take 1 tablet (100 mg total) by mouth daily.  . metoprolol succinate (TOPROL-XL) 50 MG 24 hr tablet TAKE 1 TABLET BY MOUTH DAILY  . Multiple Vitamins-Minerals (MULTI COMPLETE) CAPS Take 1 capsule by mouth daily.  Glory Rosebush VERIO test strip USE TO TEST BLOOD SUGAR TWICE DAILY  . Pitavastatin Calcium (LIVALO) 2 MG TABS Take 1 tablet at bedtime for cholesterol  . WELLBUTRIN XL 150 MG 24 hr tablet TAKE 1 TABLET BY MOUTH ONCE DAILY  . dicyclomine (BENTYL) 10 MG capsule Take 1 capsule (10 mg total) by mouth every 12 (twelve) hours as needed for spasms. (Patient not taking: Reported on 02/10/2021)   No facility-administered encounter medications on file as of 02/10/2021.    Allergies (verified) Prozac [fluoxetine hcl], Codeine, Other, and Sulfa antibiotics   History: Past Medical History:  Diagnosis Date  . Depression   . Diabetes mellitus without complication ([place])   . Diastolic dysfunction   . Hyperlipidemia   . Myasthenia gravis ([place])   . Patient is Jehovah's Witness   . Sleep apnea    Past Surgical History:  Procedure Laterality Date  . APPENDECTOMY    . BIOPSY  11/11/2020   Procedure: BIOPSY;  Surgeon: Harvel Quale, MD;  [facility];  Service: Gastroenterology;;  duodenum gastric  . BUNIONECTOMY    . COLONOSCOPY WITH PROPOFOL N/A 11/11/2020   Procedure: COLONOSCOPY WITH PROPOFOL;  Surgeon: Harvel Quale, MD;  [facility];  Service: Gastroenterology;  Laterality: N/A;  11:15  .  ESOPHAGOGASTRODUODENOSCOPY (EGD) WITH PROPOFOL N/A 11/11/2020   Procedure: ESOPHAGOGASTRODUODENOSCOPY (EGD) WITH PROPOFOL;  Surgeon: Harvel Quale, MD;  [facility];  Service: Gastroenterology;  Laterality: N/A;  to arrive at [place]  . MANDIBLE SURGERY    . TONSILECTOMY, ADENOIDECTOMY, BILATERAL MYRINGOTOMY AND TUBES     Family History  Problem Relation Age of Onset  . Arthritis Mother   . Depression Mother   . Diabetes Mother   . Hypertension Mother   . Alcohol abuse Father   . Hypertension Father   . Heart disease Father 19       Massive MI  . Diabetes Brother   . Diabetes Brother   . Heart disease Brother   . Diabetes Brother        Type I    Social History   Socioeconomic History  . Marital status: Married    Spouse name: Legrand Como  . Number of children: 4  . Years of education: some college  . Highest education level: Some college, no degree  Occupational History  . Occupation: Retired  Tobacco Use  . Smoking status: Never Smoker  . Smokeless tobacco: Never Used  Vaping Use  . Vaping Use: Never used  Substance and Sexual Activity  . Alcohol use: Yes    Comment: socially  . Drug use: No  . Sexual activity: Yes    Birth control/protection: Other-see comments    Comment: postmenopausal  Other Topics Concern  . Not on file  Social History Narrative  . Not on file   Social Determinants of Health   Financial Resource Strain: Low Risk   . Difficulty of Paying Living Expenses: Not very hard  Food Insecurity: No Food Insecurity  . Worried About Charity fundraiser in the Last Year: Never true  . Ran Out of Food in the Last Year: Never true  Transportation Needs: No Transportation Needs  . Lack of Transportation (Medical): No  . Lack of Transportation (Non-Medical): No  Physical Activity: Insufficiently Active  . Days of Exercise per Week: 2 days  . Minutes of Exercise per Session: 20 min  Stress: No Stress Concern Present  . Feeling of  Stress : Not at all  Social Connections: Moderately Integrated  . Frequency of Communication with Friends and Family: More than three times a week  . Frequency of Social Gatherings with Friends and Family: Three times a week  . Attends Religious Services: More than 4 times per year  . Active Member of Clubs or Organizations: No  . Attends Archivist Meetings: Never  . Marital Status: Married    Tobacco Counseling Counseling given: Not Answered   Clinical Intake:  Pre-visit preparation completed: Yes  Pain : No/denies pain Pain Score: 0-No pain     Nutritional Risks: None Diabetes: Yes CBG done?: No Did pt. bring in CBG monitor from home?: No  How often do you need to have someone help you when you read instructions, pamphlets, or other written materials from your doctor or pharmacy?: 1 - Never What is the last grade level you completed in school?: Some college  Diabetic?Yes Nutrition Risk Assessment:  Has the patient had any N/V/D within the last 2 months?  No  Does the patient have any non-healing wounds?  No  Has the patient had any unintentional weight loss or weight gain?  No   Diabetes:  Is the patient diabetic?  Yes  If diabetic, was a CBG obtained today?  No  Did the patient bring in their glucometer from home?  No  How often do you monitor your CBG's? Patient states checks glucose once per day.   Financial Strains and Diabetes Management:  Are you having any financial strains with the device, your supplies or your medication? No .  Does the patient want to be seen by Chronic Care Management for management of their diabetes?  No  Would the patient like to be referred to a Nutritionist or for Diabetic Management?  No   Diabetic Exams:  Diabetic Eye Exam: Completed 09/17/2020 Diabetic Foot Exam: Completed 01/28/2021   Interpreter Needed?: No  Information entered by :: [place] of Daily Living In your present state of health,  do you have any difficulty performing the following activities: 02/10/2021 01/28/2021  Hearing? Y N  Vision? Y N  Comment has blurry vision in right eye -  Difficulty concentrating or making decisions? Y N  Walking or climbing stairs? N Y  Dressing or bathing? N N  Doing errands, shopping? N N  Preparing Food and eating ? N -  Using the Toilet? N -  In the past six months, have you accidently leaked urine? Y -  Do you have problems with loss of bowel control? N -  Managing your Medications? N -  Managing your Finances? N -  Housekeeping or managing your Housekeeping? N -  Some recent data might be hidden    Patient Care Team: Lindell Spar, MD as PCP - General (Internal Medicine) Herminio Commons, MD (Inactive) as PCP - Cardiology (Cardiology) Edythe Clarity, [facility] as Pharmacist (Pharmacist)  Indicate any recent Medical Services you may have received from other than [facility] providers in the past year (date may be approximate).     Assessment:   This is a routine wellness examination for [place].  Hearing/Vision screen  Hearing Screening   '125Hz'$  $Remo'250Hz'zdTtx$'500Hz'$'1000Hz'$'2000Hz'$'3000Hz'$'4000Hz'$'6000Hz'$'8000Hz'$   Right ear:           Left ear:           Vision Screening Comments: Patient wears glasses. Gets eye exams once per year.   Dietary issues and exercise activities discussed: Current Exercise Habits: Home exercise routine, Type of exercise: walking, Time (Minutes): 20, Frequency (Times/Week): 2, Weekly Exercise (Minutes/Week): 40, Intensity: Mild, Exercise limited by: None identified  Goals    . Exercise 3x per week (30 min per time)    . Pharmacy Care Plan:     CARE PLAN ENTRY (see longitudinal plan of care for additional care plan information)  Current Barriers:  . Chronic Disease Management support, education, and care coordination needs related to Hypertension, Hyperlipidemia, Diabetes, and Depression   Hypertension BP Readings from Last 3 Encounters:  06/16/20 138/82   01/22/20 (!) 142/86  04/22/19 122/64   . Pharmacist Clinical Goal(s): o Over the next 7 days, patient will work with PharmD and providers to maintain BP goal <130/80 . Current regimen:  o Losartan $RemoveBe'100mg'PmkbGJBJl$  daily o Metoprolol Succinate XL $RemoveBefo'50mg'IVmGnpRxEIz$  . Interventions: o Reviewed office Bps o Comprehensive medication review . Patient self care activities - Over the next 7  days, patient will: o Check BP periodically, document, and provide at future appointments o Ensure daily salt intake < 2300 mg/day  Hyperlipidemia Lab Results  Component Value Date/Time   LDLCALC 68 10/18/2019 08:07 AM   . Pharmacist Clinical Goal(s): o Over the next 7 days, patient will work with PharmD and providers to maintain LDL goal < 70 . Current regimen:  o Livalo $Remove'2mg'TwDDsFU$  daily . Interventions: o Reviewed most recent lipid panel o Discussed dietary modifications to help with TG . Patient self care activities - Over the next 7 days, patient will: o Continue to focus on medication adherence by pill count o Focus on diet and exercise to bring TG's closer to goal  Diabetes Lab Results  Component Value Date/Time   HGBA1C 6.8 (H) 01/22/2020 03:57 PM   HGBA1C 7.4 (H) 10/18/2019 08:07 AM   . Pharmacist Clinical Goal(s): o Over the next 7 days, patient will work with PharmD and providers to maintain A1c goal <7% . Current regimen:  o Metformin $RemoveBef'500mg'zuWxkpSNlg$  daily with breakfast . Interventions: o Reviewed home blood sugar readings o Reviewed last A1c o Recommended repeat A1c o Recommend twice daily monitoring for one week o Counseled on diet and exercise modifications . Patient self care activities - Over the next 7 days, patient will: o Check blood sugar twice daily, document, and provide at future appointments o Contact provider with any episodes of hypoglycemia o Work on diet lower in carbohydrates and increase physical activity to 3 days weekly as tolerated o Schedule a visit with Dr. Buelah Manis for updated  A1c  Depression . Pharmacist Clinical Goal(s) o Over the next 7 days, patient will work with PharmD and providers to optimize medication related to depression. . Current regimen:   Wellbutrin XL $RemoveBefor'150mg'wMMLsZVPnVLK$  daily  Lexapro $Remove'5mg'RYYkCGl$  daily . Interventions: o Discussed current symptoms o Reviewed current medication adherence. . Patient self care activities - Over the next 7 days, patient will: o Monitor herself for withdrawal symptoms such as headache, nausea, etc. o Report any change in symptoms to providers   Initial goal documentation       Depression Screen PHQ 2/9 Scores 02/10/2021 01/28/2021 01/01/2021 09/08/2020 08/24/2020 04/22/2019 11/28/2018  PHQ - 2 Score 2 1 0 0 0 0 0  PHQ- 9 Score 3 - 0 0 - - -    Fall Risk Fall Risk  02/10/2021 01/28/2021 01/01/2021 09/08/2020 08/24/2020  Falls in the past year? 0 0 0 1 0  Number falls in past yr: 0 0 - 0 0  Injury with Fall? 0 0 - 0 0  Risk for fall due to : - No Fall Risks No Fall Risks - -  Follow up Falls evaluation completed;Falls prevention discussed Falls evaluation completed Falls evaluation completed Falls evaluation completed Falls evaluation completed    FALL RISK PREVENTION PERTAINING TO THE HOME:  Any stairs in or around the home? Yes  If so, are there any without handrails? No  Home free of loose throw rugs in walkways, pet beds, electrical cords, etc? Yes  Adequate lighting in your home to reduce risk of falls? Yes   ASSISTIVE DEVICES UTILIZED TO PREVENT FALLS:  Life alert? No  Use of a cane, walker or w/c? No  Grab bars in the bathroom? No  Shower chair or bench in shower? Yes  Elevated toilet seat or a handicapped toilet? No     Cognitive Function:     6CIT Screen 02/10/2021  What Year? 0 points  What month? 0 points  What time? 0 points  Count back from 20 0 points  Months in reverse 0 points  Repeat phrase 2 points  Total Score 2    Immunizations Immunization History  Administered Date(s) Administered  .  Moderna SARS-COV2 Booster Vaccination 11/14/2020  . Moderna Sars-Covid-2 Vaccination 04/14/2020, 05/12/2020    TDAP status: Due, Education has been provided regarding the importance of this vaccine. Advised may receive this vaccine at local pharmacy or Health Dept. Aware to provide a copy of the vaccination record if obtained from local pharmacy or Health Dept. Verbalized acceptance and understanding.  Flu Vaccine status: Declined, Education has been provided regarding the importance of this vaccine but patient still declined. Advised may receive this vaccine at local pharmacy or Health Dept. Aware to provide a copy of the vaccination record if obtained from local pharmacy or Health Dept. Verbalized acceptance and understanding.  Pneumococcal vaccine status: Due, Education has been provided regarding the importance of this vaccine. Advised may receive this vaccine at local pharmacy or Health Dept. Aware to provide a copy of the vaccination record if obtained from local pharmacy or Health Dept. Verbalized acceptance and understanding.  Covid-19 vaccine status: Completed vaccines  Qualifies for Shingles Vaccine? Yes   Zostavax completed No   Shingrix Completed?: No.    Education has been provided regarding the importance of this vaccine. Patient has been advised to call insurance company to determine out of pocket expense if they have not yet received this vaccine. Advised may also receive vaccine at local pharmacy or Health Dept. Verbalized acceptance and understanding.  Screening Tests Health Maintenance  Topic Date Due  . MAMMOGRAM  03/30/2021 (Originally 01/25/2018)  . TETANUS/TDAP  01/01/2022 (Originally 10/07/1970)  . PNA vac Low Risk Adult (1 of 2 - PCV13) 01/01/2022 (Originally 10/07/2016)  . INFLUENZA VACCINE  05/31/2021  . HEMOGLOBIN A1C  06/27/2021  . OPHTHALMOLOGY EXAM  09/17/2021  . FOOT EXAM  01/28/2022  . COLONOSCOPY (Pts 45-33yrs Insurance coverage will need to be confirmed)   11/11/2030  . DEXA SCAN  Completed  . COVID-19 Vaccine  Completed  . Hepatitis C Screening  Completed  . HPV VACCINES  Aged Out    Health Maintenance  There are no preventive care reminders to display for this patient.  Colorectal cancer screening: Type of screening: Colonoscopy. Completed 11/11/2020. Repeat every 10 years  Mammogram status: Ordered 01/26/2016. Pt provided with contact info and advised to call to schedule appt.   Bone Density status: Completed 11/09/2018. Results reflect: Bone density results: NORMAL. Repeat every 5 years.  Lung Cancer Screening: (Low Dose CT Chest recommended if Age 5-80 years, 30 pack-year currently smoking OR have quit w/in 15years.) does not qualify.   Lung Cancer Screening Referral: N/A   Additional Screening:  Hepatitis C Screening: does qualify; Completed 11/28/2018  Vision Screening: Recommended annual ophthalmology exams for early detection of glaucoma and other disorders of the eye. Is the patient up to date with their annual eye exam?  Yes  Who is the provider or what is the name of the office in which the patient attends annual eye exams? Dr. [name]  If pt is not established with a provider, would they like to be referred to a provider to establish care? No .   Dental Screening: Recommended annual dental exams for proper oral hygiene  Community Resource Referral / Chronic Care Management: CRR required this visit?  No   CCM required this visit?  No      Plan:  I have personally reviewed and noted the following in the patient's chart:   . Medical and social history . Use of alcohol, tobacco or illicit drugs  . Current medications and supplements . Functional ability and status . Nutritional status . Physical activity . Advanced directives . List of other physicians . Hospitalizations, surgeries, and ER visits in previous 12 months . Vitals . Screenings to include cognitive, depression, and falls . Referrals and  appointments  In addition, I have reviewed and discussed with patient certain preventive protocols, quality metrics, and best practice recommendations. A written personalized care plan for preventive services as well as general preventive health recommendations were provided to patient.     Ofilia Neas, LPN   [id number]   Nurse Notes: None

## 2021-02-10 ENCOUNTER — Telehealth (INDEPENDENT_AMBULATORY_CARE_PROVIDER_SITE_OTHER): Payer: Medicare Other

## 2021-02-10 DIAGNOSIS — Z0000 Encounter for general adult medical examination without abnormal findings: Secondary | ICD-10-CM

## 2021-02-10 NOTE — Patient Instructions (Signed)
Carolyn [name] , Thank you for taking time to come for your Medicare Wellness Visit. I appreciate your ongoing commitment to your health goals. Please review the following plan we discussed and let me know if I can assist you in the future.   Screening recommendations/referrals: Colonoscopy: Up to date, next due 11/11/2030 Mammogram: Currently due, if your Gynecologist does not order please let us know and we will gladly order for you.  Bone Density: No longer required  Recommended yearly ophthalmology/optometry visit for glaucoma screening and checkup Recommended yearly dental visit for hygiene and checkup  Vaccinations: Influenza vaccine: Patient declined  Pneumococcal vaccine: Patient declined  Tdap vaccine: Currently due, you may await and injury to receive or receive at you local pharmacy. Shingles vaccine: Currently due for Shingrix, if you would like to receive we recommend that you do so at your local pharmacy.    Advanced directives: Copies on file   Conditions/risks identified: None   Next appointment: 02/16/2022 @ 1:00 PM    Preventive Care 65 Years and Older, Female Preventive care refers to lifestyle choices and visits with your health care provider that can promote health and wellness. What does preventive care include?  A yearly physical exam. This is also called an annual well check.  Dental exams once or twice a year.  Routine eye exams. Ask your health care provider how often you should have your eyes checked.  Personal lifestyle choices, including:  Daily care of your teeth and gums.  Regular physical activity.  Eating a healthy diet.  Avoiding tobacco and drug use.  Limiting alcohol use.  Practicing safe sex.  Taking low-dose aspirin every day.  Taking vitamin and mineral supplements as recommended by your health care provider. What happens during an annual well check? The services and screenings done by your health care provider during your annual  well check will depend on your age, overall health, lifestyle risk factors, and family history of disease. Counseling  Your health care provider may ask you questions about your:  Alcohol use.  Tobacco use.  Drug use.  Emotional well-being.  Home and relationship well-being.  Sexual activity.  Eating habits.  History of falls.  Memory and ability to understand (cognition).  Work and work Statistician.  Reproductive health. Screening  You may have the following tests or measurements:  Height, weight, and BMI.  Blood pressure.  Lipid and cholesterol levels. These may be checked every 5 years, or more frequently if you are over 35 years old.  Skin check.  Lung cancer screening. You may have this screening every year starting at age 14 if you have a 30-pack-year history of smoking and currently smoke or have quit within the past 15 years.  Fecal occult blood test (FOBT) of the stool. You may have this test every year starting at age 31.  Flexible sigmoidoscopy or colonoscopy. You may have a sigmoidoscopy every 5 years or a colonoscopy every 10 years starting at age 14.  Hepatitis C blood test.  Hepatitis B blood test.  Sexually transmitted disease (STD) testing.  Diabetes screening. This is done by checking your blood sugar (glucose) after you have not eaten for a while (fasting). You may have this done every 1-3 years.  Bone density scan. This is done to screen for osteoporosis. You may have this done starting at age 60.  Mammogram. This may be done every 1-2 years. Talk to your health care provider about how often you should have regular mammograms. Talk with your  health care provider about your test results, treatment options, and if necessary, the need for more tests. Vaccines  Your health care provider may recommend certain vaccines, such as:  Influenza vaccine. This is recommended every year.  Tetanus, diphtheria, and acellular pertussis (Tdap, Td) vaccine.  You may need a Td booster every 10 years.  Zoster vaccine. You may need this after age 26.  Pneumococcal 13-valent conjugate (PCV13) vaccine. One dose is recommended after age 63.  Pneumococcal polysaccharide (PPSV23) vaccine. One dose is recommended after age 81. Talk to your health care provider about which screenings and vaccines you need and how often you need them. This information is not intended to replace advice given to you by your health care provider. Make sure you discuss any questions you have with your health care provider. Document Released: 11/13/2015 Document Revised: 07/06/2016 Document Reviewed: 08/18/2015 Elsevier Interactive Patient Education  2017 [place] Prevention in the Home Falls can cause injuries. They can happen to people of all ages. There are many things you can do to make your home safe and to help prevent falls. What can I do on the outside of my home?  Regularly fix the edges of walkways and driveways and fix any cracks.  Remove anything that might make you trip as you walk through a door, such as a raised step or threshold.  Trim any bushes or trees on the path to your home.  Use bright outdoor lighting.  Clear any walking paths of anything that might make someone trip, such as rocks or tools.  Regularly check to see if handrails are loose or broken. Make sure that both sides of any steps have handrails.  Any raised decks and porches should have guardrails on the edges.  Have any leaves, snow, or ice cleared regularly.  Use sand or salt on walking paths during winter.  Clean up any spills in your garage right away. This includes oil or grease spills. What can I do in the bathroom?  Use night lights.  Install grab bars by the toilet and in the tub and shower. Do not use towel bars as grab bars.  Use non-skid mats or decals in the tub or shower.  If you need to sit down in the shower, use a plastic, non-slip stool.  Keep the  floor dry. Clean up any water that spills on the floor as soon as it happens.  Remove soap buildup in the tub or shower regularly.  Attach bath mats securely with double-sided non-slip rug tape.  Do not have throw rugs and other things on the floor that can make you trip. What can I do in the bedroom?  Use night lights.  Make sure that you have a light by your bed that is easy to reach.  Do not use any sheets or blankets that are too big for your bed. They should not hang down onto the floor.  Have a firm chair that has side arms. You can use this for support while you get dressed.  Do not have throw rugs and other things on the floor that can make you trip. What can I do in the kitchen?  Clean up any spills right away.  Avoid walking on wet floors.  Keep items that you use a lot in easy-to-reach places.  If you need to reach something above you, use a strong step stool that has a grab bar.  Keep electrical cords out of the way.  Do not use  floor polish or wax that makes floors slippery. If you must use wax, use non-skid floor wax.  Do not have throw rugs and other things on the floor that can make you trip. What can I do with my stairs?  Do not leave any items on the stairs.  Make sure that there are handrails on both sides of the stairs and use them. Fix handrails that are broken or loose. Make sure that handrails are as long as the stairways.  Check any carpeting to make sure that it is firmly attached to the stairs. Fix any carpet that is loose or worn.  Avoid having throw rugs at the top or bottom of the stairs. If you do have throw rugs, attach them to the floor with carpet tape.  Make sure that you have a light switch at the top of the stairs and the bottom of the stairs. If you do not have them, ask someone to add them for you. What else can I do to help prevent falls?  Wear shoes that:  Do not have high heels.  Have rubber bottoms.  Are comfortable and fit  you well.  Are closed at the toe. Do not wear sandals.  If you use a stepladder:  Make sure that it is fully opened. Do not climb a closed stepladder.  Make sure that both sides of the stepladder are locked into place.  Ask someone to hold it for you, if possible.  Clearly mark and make sure that you can see:  Any grab bars or handrails.  First and last steps.  Where the edge of each step is.  Use tools that help you move around (mobility aids) if they are needed. These include:  Canes.  Walkers.  Scooters.  Crutches.  Turn on the lights when you go into a dark area. Replace any light bulbs as soon as they burn out.  Set up your furniture so you have a clear path. Avoid moving your furniture around.  If any of your floors are uneven, fix them.  If there are any pets around you, be aware of where they are.  Review your medicines with your doctor. Some medicines can make you feel dizzy. This can increase your chance of falling. Ask your doctor what other things that you can do to help prevent falls. This information is not intended to replace advice given to you by your health care provider. Make sure you discuss any questions you have with your health care provider. Document Released: 08/13/2009 Document Revised: 03/24/2016 Document Reviewed: 11/21/2014 Elsevier Interactive Patient Education  2017 [employer].

## 2021-02-17 DIAGNOSIS — H5213 Myopia, bilateral: Secondary | ICD-10-CM | POA: Diagnosis not present

## 2021-02-17 DIAGNOSIS — H52203 Unspecified astigmatism, bilateral: Secondary | ICD-10-CM | POA: Diagnosis not present

## 2021-02-17 DIAGNOSIS — Z7984 Long term (current) use of oral hypoglycemic drugs: Secondary | ICD-10-CM | POA: Diagnosis not present

## 2021-02-17 DIAGNOSIS — Z1231 Encounter for screening mammogram for malignant neoplasm of breast: Secondary | ICD-10-CM | POA: Diagnosis not present

## 2021-02-17 DIAGNOSIS — E1136 Type 2 diabetes mellitus with diabetic cataract: Secondary | ICD-10-CM | POA: Diagnosis not present

## 2021-02-17 DIAGNOSIS — H25813 Combined forms of age-related cataract, bilateral: Secondary | ICD-10-CM | POA: Diagnosis not present

## 2021-02-17 DIAGNOSIS — H524 Presbyopia: Secondary | ICD-10-CM | POA: Diagnosis not present

## 2021-02-17 LAB — HM DIABETES EYE EXAM

## 2021-02-17 LAB — HM MAMMOGRAPHY

## 2021-02-18 ENCOUNTER — Encounter: Payer: Self-pay | Admitting: *Deleted

## 2021-02-23 DIAGNOSIS — H25813 Combined forms of age-related cataract, bilateral: Secondary | ICD-10-CM | POA: Diagnosis not present

## 2021-02-27 DIAGNOSIS — G4733 Obstructive sleep apnea (adult) (pediatric): Secondary | ICD-10-CM | POA: Diagnosis not present

## 2021-03-01 DIAGNOSIS — G4733 Obstructive sleep apnea (adult) (pediatric): Secondary | ICD-10-CM | POA: Diagnosis not present

## 2021-03-02 ENCOUNTER — Encounter: Payer: Self-pay | Admitting: Physical Therapy

## 2021-03-02 ENCOUNTER — Other Ambulatory Visit: Payer: Self-pay

## 2021-03-02 ENCOUNTER — Ambulatory Visit: Payer: Medicare Other | Attending: Obstetrics and Gynecology | Admitting: Physical Therapy

## 2021-03-02 DIAGNOSIS — R293 Abnormal posture: Secondary | ICD-10-CM | POA: Diagnosis not present

## 2021-03-02 DIAGNOSIS — M6281 Muscle weakness (generalized): Secondary | ICD-10-CM | POA: Diagnosis not present

## 2021-03-03 NOTE — Therapy (Signed)
[facility] [address] [place], [place], [address] Phone: [phone]   Fax:  [phone]  Physical Therapy Evaluation  Patient Details  Name: Carolyn [name] MRN: [id number] Date of Birth: 05/30/1951 Referring Provider (PT): Marylynn Pearson, MD   Encounter Date: 03/02/2021   PT End of Session - 03/03/21 0928    Visit Number 1    Date for PT Re-Evaluation 05/25/21    Authorization Type [employer] medicare    PT Start Time 0849    PT Stop Time 0925    PT Time Calculation (min) 36 min    Activity Tolerance Patient tolerated treatment well    Behavior During Therapy [facility] for tasks assessed/performed           Past Medical History:  Diagnosis Date  . Depression   . Diabetes mellitus without complication ([place])   . Diastolic dysfunction   . Hyperlipidemia   . Myasthenia gravis ([place])   . Patient is Jehovah's Witness   . Sleep apnea     Past Surgical History:  Procedure Laterality Date  . APPENDECTOMY    . BIOPSY  11/11/2020   Procedure: BIOPSY;  Surgeon: Harvel Quale, MD;  Location: [facility];  Service: Gastroenterology;;  duodenum gastric  . BUNIONECTOMY    . COLONOSCOPY WITH PROPOFOL N/A 11/11/2020   Procedure: COLONOSCOPY WITH PROPOFOL;  Surgeon: Harvel Quale, MD;  Location: [facility];  Service: Gastroenterology;  Laterality: N/A;  11:15  . ESOPHAGOGASTRODUODENOSCOPY (EGD) WITH PROPOFOL N/A 11/11/2020   Procedure: ESOPHAGOGASTRODUODENOSCOPY (EGD) WITH PROPOFOL;  Surgeon: Harvel Quale, MD;  Location: [facility];  Service: Gastroenterology;  Laterality: N/A;  to arrive at [place]  . MANDIBLE SURGERY    . TONSILECTOMY, ADENOIDECTOMY, BILATERAL MYRINGOTOMY AND TUBES      There were no vitals filed for this visit.    Subjective Assessment - 03/02/21 0849    Subjective Pt states she is having no time to get to the bathroom and coughing, sneezing, bending all causes  leakage.  Full incontinence of bladder is occurring.  Pt states uses 3 pads when going out and is JIC peeing.  Pt is going every hour to the bathroom.    Currently in Pain? No/denies              [facility] PT Assessment - 03/03/21 0001      Assessment   Medical Diagnosis R32 (ICD-10-CM) - Unspecified urinary incontinence    Referring Provider (PT) Marylynn Pearson, MD    Onset Date/Surgical Date --   a lot more last year;   Prior Therapy No      Precautions   Precautions None      Balance Screen   Has the patient fallen in the past 6 months No      [place] residence    Living Arrangements Spouse/significant other      Prior Function   Level of [place] work    Leisure going places      Cognition   Overall Cognitive Status Within Functional Limits for tasks assessed      Functional Tests   Functional tests Single leg stance      Single Leg Stance   Comments trendelenburg bil      Posture/Postural Control   Posture/Postural Control Postural limitations    Postural Limitations Rounded Shoulders;Increased thoracic kyphosis;Anterior pelvic tilt      ROM /  Strength   AROM / PROM / Strength AROM;Strength      AROM   Overall AROM Comments lumbar flex 50%      Strength   Overall Strength Comments hip abd 4/5      Flexibility   Soft Tissue Assessment /Muscle Length yes    Hamstrings 60%                      Objective measurements completed on examination: See above findings.     Pelvic Floor Special Questions - 03/03/21 0001    Number of Pregnancies 6    Number of Vaginal Deliveries 4    Any difficulty with labor and deliveries Yes    Urinary Leakage Yes    Pelvic Floor Internal Exam pt identity confirmed and internal soft tissue performed    Exam Type Vaginal    Strength weak squeeze, no lift    Strength # of reps 4    Strength # of seconds 4                          PT Long Term Goals - 03/02/21 0930      PT LONG TERM GOAL #1   Title Pt will report at least 75% less volume of leakage    Baseline full incontinence    Time 12    Period Weeks    Status New    Target Date 05/25/21      PT LONG TERM GOAL #2   Title pt will be ind with advanced HEP    Time 12    Period Weeks    Status New    Target Date 05/25/21      PT LONG TERM GOAL #3   Title Pt will be able to void every 2-4 hours    Time 12    Period Weeks    Status New    Target Date 05/25/21      PT LONG TERM GOAL #4   Title Pt will be able to cough and sneeze at least 50% of the time without leakage due to    Time 12    Period Weeks    Status New    Target Date 05/25/21                  Plan - 03/02/21 8299    Clinical Impression Statement Pt presents to skilled PT due to leakge that has been worsening.  Pt has 2/5 MMT 4 sec, 4 quick, posture abnormality increased kyphosis and slight lateral shift left, anterior pelvic tilt, single leg stand trendelenburg bil. She has tight lumbar and hamstrings 60% . pt will benefit from skilled PT to address these impairments and improve funciton without leakage    Stability/Clinical Decision Making Stable/Uncomplicated    Clinical Decision Making Low    Rehab Potential Excellent    PT Frequency 1x / week    PT Duration 12 weeks    PT Treatment/Interventions Taping;Dry needling;Therapeutic activities;Therapeutic exercise;ADLs/Self Care Home Management;Biofeedback;Cryotherapy;Electrical Stimulation;Moist Heat;Manual techniques;Patient/family education    PT Next Visit Plan f/u on urge, isolate kegel muscles and basic ex's, h/s stretch           Patient will benefit from skilled therapeutic intervention in order to improve the following deficits and impairments:  Increased muscle spasms,Increased fascial restricitons,Decreased strength,Decreased coordination,Decreased range of motion,Impaired  flexibility,Postural dysfunction  Visit Diagnosis: Muscle weakness (generalized)  Abnormal posture  Problem List Patient Active Problem List   Diagnosis Date Noted  . IBS (irritable bowel syndrome) 10/14/2020  . History of colonic polyps 10/14/2020  . Fatty liver 09/01/2020  . 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 Posterior vitreous detachment of left eye 03/19/2020  . Hypertensive retinopathy, grade 1, left 03/19/2020  . Posterior vitreous detachment of both eyes 03/19/2020  . Esophageal dysmotility 02/07/2018  . Diabetes mellitus, type II ([place]) 10/30/2017  . Hypertension 10/30/2017  . Diastolic dysfunction 96/75/9163  . Depression, recurrent ([place]) 10/30/2017  . Obesity 10/30/2017  . Hyperlipidemia 10/30/2017  . Sleep apnea 10/30/2017  . Multiple nevi 10/30/2017  . Myasthenia gravis without acute exacerbation ([place]) 10/30/2017  . Patient is Jehovah's Witness 10/30/2017    Jule Ser, PT 03/03/2021, 11:01 AM  [facility] [address] [place], [place], [address] Phone: [phone]   Fax:  [phone]  Name: Carolyn [name] MRN: [id number] Date of Birth: 12-03-1950

## 2021-03-10 ENCOUNTER — Other Ambulatory Visit: Payer: Self-pay

## 2021-03-10 ENCOUNTER — Other Ambulatory Visit (HOSPITAL_COMMUNITY)
Admission: RE | Admit: 2021-03-10 | Discharge: 2021-03-10 | Disposition: A | Discharge status: Home / Self Care | Payer: Medicare Other | Source: Ambulatory Visit | Attending: Internal Medicine | Admitting: Internal Medicine

## 2021-03-10 ENCOUNTER — Encounter: Payer: Self-pay | Admitting: Internal Medicine

## 2021-03-10 ENCOUNTER — Ambulatory Visit (INDEPENDENT_AMBULATORY_CARE_PROVIDER_SITE_OTHER): Payer: Medicare Other | Admitting: Internal Medicine

## 2021-03-10 VITALS — BP 146/87 | HR 80 | Temp 98.0°F | Ht 63.0 in | Wt 202.0 lb

## 2021-03-10 DIAGNOSIS — L304 Erythema intertrigo: Secondary | ICD-10-CM | POA: Diagnosis not present

## 2021-03-10 DIAGNOSIS — N898 Other specified noninflammatory disorders of vagina: Secondary | ICD-10-CM

## 2021-03-10 DIAGNOSIS — E1169 Type 2 diabetes mellitus with other specified complication: Secondary | ICD-10-CM

## 2021-03-10 DIAGNOSIS — I10 Essential (primary) hypertension: Secondary | ICD-10-CM

## 2021-03-10 MED ORDER — LOSARTAN POTASSIUM-HCTZ 100-12.5 MG PO TABS: 1.0000 | ORAL_TABLET | Freq: Every day | ORAL | 3 refills | Status: DC

## 2021-03-10 MED ORDER — METOPROLOL SUCCINATE ER 25 MG PO TB24: 25.0000 mg | ORAL_TABLET | Freq: Every day | ORAL | 1 refills | Status: DC

## 2021-03-10 MED ORDER — KETOCONAZOLE 2 % EX CREA: 1.0000 "application " | TOPICAL_CREAM | Freq: Every day | CUTANEOUS | 0 refills | Status: DC

## 2021-03-10 NOTE — Progress Notes (Signed)
Established Patient Office Visit  Subjective:  Patient ID: Carolyn [name], female    DOB: Nov 11, 1950  Age: 70 y.o. MRN: [id number]  CC:  Chief Complaint  Patient presents with  . Hypertension    Wants to discuss bp medication Metoprolol. Feel like this is not helping and the cause of her fatigue.  . Fatigue    Ongoing for several months but has gotten worse.  . Vaginal Itching    Feels like this is being caused by her [place]. Has been to GYN and does not have any yeast, infections, etc. They told her this could be a side effect.    HPI Carolyn [name] is a 70 year old female with PMH of HTN, OSA, IBS, DM, HLD, depression and obesity who presents for review of her mediations.  She has been having worse fatigue since she started taking increased dose of Metoprolol. She tolerated 25 mg dose well. Her BP was elevated in the office, which she attributes to her being stressed with her fatigue. She has been taking Losartan regularly. She denies any headache, dizziness, chest pain, dyspnea or palpitations.  DM: She takes [place] regularly. But she has been having groin and perineal area itching. She had Ob/Gyn visit and was told she does not have fungal infection about a month ago. She is concerned about Wilder Glade as it is associated with vaginitis. Denies any fever, chills, dysuria, hematuria or pelvic pain currently. She prefers to stay with Wilder Glade if it is not the reason for her symptoms.  Past Medical History:  Diagnosis Date  . Depression   . Diabetes mellitus without complication ([place])   . Diastolic dysfunction   . Hyperlipidemia   . Myasthenia gravis ([place])   . Patient is Jehovah's Witness   . Sleep apnea     Past Surgical History:  Procedure Laterality Date  . APPENDECTOMY    . BIOPSY  11/11/2020   Procedure: BIOPSY;  Surgeon: Harvel Quale, MD;  [facility];  Service: Gastroenterology;;  duodenum gastric  . BUNIONECTOMY    . COLONOSCOPY WITH PROPOFOL  N/A 11/11/2020   Procedure: COLONOSCOPY WITH PROPOFOL;  Surgeon: Harvel Quale, MD;  [facility];  Service: Gastroenterology;  Laterality: N/A;  11:15  . ESOPHAGOGASTRODUODENOSCOPY (EGD) WITH PROPOFOL N/A 11/11/2020   Procedure: ESOPHAGOGASTRODUODENOSCOPY (EGD) WITH PROPOFOL;  Surgeon: Harvel Quale, MD;  [facility];  Service: Gastroenterology;  Laterality: N/A;  to arrive at [place]  . MANDIBLE SURGERY    . TONSILECTOMY, ADENOIDECTOMY, BILATERAL MYRINGOTOMY AND TUBES      Family History  Problem Relation Age of Onset  . Arthritis Mother   . Depression Mother   . Diabetes Mother   . Hypertension Mother   . Alcohol abuse Father   . Hypertension Father   . Heart disease Father 73       Massive MI  . Diabetes Brother   . Diabetes Brother   . Heart disease Brother   . Diabetes Brother        Type I     Social History   Socioeconomic History  . Marital status: Married    Spouse name: Legrand Como  . Number of children: 4  . Years of education: some college  . Highest education level: Some college, no degree  Occupational History  . Occupation: Retired  Tobacco Use  . Smoking status: Never Smoker  . Smokeless tobacco: Never Used  Vaping Use  . Vaping Use: Never used  Substance  and Sexual Activity  . Alcohol use: Yes    Comment: socially  . Drug use: No  . Sexual activity: Yes    Birth control/protection: Other-see comments    Comment: postmenopausal  Other Topics Concern  . Not on file  Social History Narrative  . Not on file   Social Determinants of Health   Financial Resource Strain: Low Risk   . Difficulty of Paying Living Expenses: Not very hard  Food Insecurity: No Food Insecurity  . Worried About Charity fundraiser in the Last Year: Never true  . Ran Out of Food in the Last Year: Never true  Transportation Needs: No Transportation Needs  . Lack of Transportation (Medical): No  . Lack of Transportation (Non-Medical):  No  Physical Activity: Insufficiently Active  . Days of Exercise per Week: 2 days  . Minutes of Exercise per Session: 20 min  Stress: No Stress Concern Present  . Feeling of Stress : Not at all  Social Connections: Moderately Integrated  . Frequency of Communication with Friends and Family: More than three times a week  . Frequency of Social Gatherings with Friends and Family: Three times a week  . Attends Religious Services: More than 4 times per year  . Active Member of Clubs or Organizations: No  . Attends Archivist Meetings: Never  . Marital Status: Married  Human resources officer Violence: Not At Risk  . Fear of Current or Ex-Partner: No  . Emotionally Abused: No  . Physically Abused: No  . Sexually Abused: No    Outpatient Medications Prior to Visit  Medication Sig Dispense Refill  . Blood Glucose Monitoring Suppl (BLOOD GLUCOSE SYSTEM PAK) KIT Use as directed to monitor FSBS 2x daily. Dx: E11.9 1 each 1  . Cholecalciferol (VITAMIN D3) 125 MCG (5000 UT) TABS Take 5,000 Units by mouth daily.    . dapagliflozin propanediol (FARXIGA) 10 MG TABS tablet Take 1 tablet (10 mg total) by mouth daily before breakfast. 90 tablet 2  . dicyclomine (BENTYL) 10 MG capsule Take 1 capsule (10 mg total) by mouth every 12 (twelve) hours as needed for spasms. 60 capsule 1  . Glycerin-Hypromellose-PEG 400 (DRY EYE RELIEF DROPS) 0.2-0.2-1 % SOLN Place 1 drop into both eyes daily as needed (Dry eye).    Marland Kitchen icosapent Ethyl (VASCEPA) 1 g capsule TAKE 2 CAPSULES BY MOUTH TWICE DAILY WITH A MEAL 120 capsule 3  . Multiple Vitamins-Minerals (MULTI COMPLETE) CAPS Take 1 capsule by mouth daily.    Glory Rosebush VERIO test strip USE TO TEST BLOOD SUGAR TWICE DAILY 100 strip 3  . WELLBUTRIN XL 150 MG 24 hr tablet TAKE 1 TABLET BY MOUTH ONCE DAILY 90 tablet 3  . losartan (COZAAR) 100 MG tablet Take 1 tablet (100 mg total) by mouth daily. 90 tablet 3  . metoprolol succinate (TOPROL-XL) 50 MG 24 hr tablet TAKE 1  TABLET BY MOUTH DAILY 90 tablet 3  . Pitavastatin Calcium (LIVALO) 2 MG TABS Take 1 tablet at bedtime for cholesterol (Patient not taking: Reported on 03/10/2021) 30 tablet 3   No facility-administered medications prior to visit.    Allergies  Allergen Reactions  . Prozac [Fluoxetine Hcl] Anaphylaxis  . Codeine Other (See Comments)    Intolerance   . Other     -mycin medications: has myasthenia gravis   . Sulfa Antibiotics Hives    ROS Review of Systems  Constitutional: Positive for fatigue. Negative for chills and fever.  Respiratory: Negative for cough and  shortness of breath.   Cardiovascular: Negative for chest pain and palpitations.  Gastrointestinal: Negative for constipation and diarrhea.  Endocrine: Negative for polydipsia and polyuria.  Genitourinary: Positive for vaginal discharge (Intermittent). Negative for dysuria and hematuria.  Musculoskeletal: Negative for neck pain and neck stiffness.  Neurological: Positive for weakness (Generalized). Negative for dizziness.  Psychiatric/Behavioral: Negative for agitation and behavioral problems. The patient is nervous/anxious.       Objective:    Physical Exam Vitals reviewed.  Constitutional:      General: She is not in acute distress.    Appearance: She is not diaphoretic.  HENT:     Head: Normocephalic and atraumatic.     Nose: Nose normal. No congestion.     Mouth/Throat:     Mouth: Mucous membranes are moist.     Pharynx: No posterior oropharyngeal erythema.  Eyes:     General: No scleral icterus.    Extraocular Movements: Extraocular movements intact.  Cardiovascular:     Rate and Rhythm: Normal rate and regular rhythm.     Pulses: Normal pulses.     Heart sounds: Normal heart sounds. No murmur heard.   Pulmonary:     Breath sounds: Normal breath sounds. No wheezing or rales.  Musculoskeletal:     Cervical back: Neck supple. No tenderness.     Right lower leg: No edema.     Left lower leg: No edema.   Skin:    General: Skin is warm.     Findings: No rash.  Neurological:     General: No focal deficit present.     Mental Status: She is alert and oriented to person, place, and time.  Psychiatric:        Mood and Affect: Mood normal.        Behavior: Behavior normal.     BP (!) 146/87 (BP Location: Left Arm, Patient Position: Sitting, Cuff Size: Large)   Pulse 80   Temp 98 F (36.7 C) (Temporal)   Ht $R'5\' 3"'Az$  (1.6 m)   Wt 202 lb (91.6 kg)   SpO2 97%   BMI 35.78 kg/m  Wt Readings from Last 3 Encounters:  03/10/21 202 lb (91.6 kg)  01/28/21 200 lb 6.4 oz (90.9 kg)  01/18/21 200 lb (90.7 kg)     There are no preventive care reminders to display for this patient.  There are no preventive care reminders to display for this patient.  Lab Results  Component Value Date   TSH 2.36 01/22/2020   Lab Results  Component Value Date   WBC 4.9 12/28/2020   HGB 15.7 (H) 12/28/2020   HCT 45.5 (H) 12/28/2020   MCV 93.0 12/28/2020   PLT 236 12/28/2020   Lab Results  Component Value Date   NA 140 12/28/2020   K 4.1 12/28/2020   CO2 20 12/28/2020   GLUCOSE 140 (H) 12/28/2020   BUN 11 12/28/2020   CREATININE 0.87 12/28/2020   BILITOT 0.8 12/28/2020   AST 25 12/28/2020   ALT 26 12/28/2020   PROT 6.8 12/28/2020   CALCIUM 9.3 12/28/2020   ANIONGAP 9 11/10/2020   Lab Results  Component Value Date   CHOL 223 (H) 12/28/2020   Lab Results  Component Value Date   HDL 37 (L) 12/28/2020   Lab Results  Component Value Date   LDLCALC 130 (H) 12/28/2020   Lab Results  Component Value Date   TRIG 392 (H) 12/28/2020   Lab Results  Component Value Date   CHOLHDL  6.0 (H) 12/28/2020   Lab Results  Component Value Date   HGBA1C 6.8 (H) 12/28/2020      Assessment & Plan:   Problem List Items Addressed This Visit      Cardiovascular and Mediastinum   Hypertension - Primary    BP Readings from Last 1 Encounters:  03/10/21 (!) 146/87   uncontrolled with Losartan 100 mg  QD and Metoprolol 50 mg QD Will switch to Losartan-HCTZ 100-12.5 mg QD Decrease dose of Metoprolol to 25 mg QD due to worse fatigue Counseled for compliance with the medications Advised DASH diet and moderate exercise/walking, at least 150 mins/week       Relevant Medications   metoprolol succinate (TOPROL-XL) 25 MG 24 hr tablet   losartan-hydrochlorothiazide (HYZAAR) 100-12.5 MG tablet     Endocrine   Diabetes mellitus, type II ([place])    Lab Results  Component Value Date   HGBA1C 6.8 (H) 12/28/2020   On Farxiga, did not tolerate Metformin Advised to follow diabetic diet On ARB and statin Will continue [place] for now until confirmed vaginitis with vaginal swab result Discussed about switching medication if she has recurrent vulvovaginitis, patient agrees.      Relevant Medications   losartan-hydrochlorothiazide (HYZAAR) 100-12.5 MG tablet   Other Relevant Orders   Ambulatory referral to Podiatry    Other Visit Diagnoses    Intertrigo    Groin area itching likely intertrigo Prescribed Ketoconazole cream    Relevant Medications   ketoconazole (NIZORAL) 2 % cream   Vaginal itching       Relevant Orders   UA/M w/rflx Culture, Routine   Cervicovaginal ancillary only      Meds ordered this encounter  Medications  . metoprolol succinate (TOPROL-XL) 25 MG 24 hr tablet    Sig: Take 1 tablet (25 mg total) by mouth daily. TAKE 1 TABLET BY MOUTH DAILY    Dispense:  90 tablet    Refill:  1    Dose Change  . losartan-hydrochlorothiazide (HYZAAR) 100-12.5 MG tablet    Sig: Take 1 tablet by mouth daily.    Dispense:  30 tablet    Refill:  3    Please cancel Losartan 100 mg.  . ketoconazole (NIZORAL) 2 % cream    Sig: Apply 1 application topically daily.    Dispense:  15 g    Refill:  0    Follow-up: Return in about 3 months (around 06/10/2021) for HTN and DM.    Lindell Spar, MD

## 2021-03-10 NOTE — Assessment & Plan Note (Signed)
BP Readings from Last 1 Encounters:  03/10/21 (!) 146/87   uncontrolled with Losartan 100 mg QD and Metoprolol 50 mg QD Will switch to Losartan-HCTZ 100-12.5 mg QD Decrease dose of Metoprolol to 25 mg QD due to worse fatigue Counseled for compliance with the medications Advised DASH diet and moderate exercise/walking, at least 150 mins/week

## 2021-03-10 NOTE — Patient Instructions (Addendum)
Please stop taking Losartan and start taking Losarta-HCTZ 100-12.5 mg once daily.  Please start taking Metoprolol 25 mg instead of 50 mg.  You are being referred to Podiatry.

## 2021-03-10 NOTE — Assessment & Plan Note (Addendum)
Lab Results  Component Value Date   HGBA1C 6.8 (H) 12/28/2020   On Farxiga, did not tolerate Metformin Advised to follow diabetic diet On ARB and statin Will continue [place] for now until confirmed vaginitis with vaginal swab result Discussed about switching medication if she has recurrent vulvovaginitis, patient agrees.

## 2021-03-12 ENCOUNTER — Other Ambulatory Visit: Payer: Self-pay | Admitting: Internal Medicine

## 2021-03-12 DIAGNOSIS — B3731 Acute candidiasis of vulva and vagina: Secondary | ICD-10-CM

## 2021-03-12 LAB — CERVICOVAGINAL ANCILLARY ONLY
Bacterial Vaginitis (gardnerella): NEGATIVE
Candida Glabrata: NEGATIVE
Candida Vaginitis: POSITIVE — AB
Chlamydia: NEGATIVE
Comment: NEGATIVE
Comment: NEGATIVE
Comment: NEGATIVE
Comment: NEGATIVE
Comment: NEGATIVE
Comment: NORMAL
Neisseria Gonorrhea: NEGATIVE
Trichomonas: NEGATIVE

## 2021-03-12 MED ORDER — FLUCONAZOLE 150 MG PO TABS: ORAL_TABLET | ORAL | 0 refills | Status: DC

## 2021-03-12 NOTE — Progress Notes (Signed)
Called patient to inform about candidal vaginitis. Sent Fluconazole. Discussed about Wilder Glade, agrees to continue as this is her first vaginitis episode.

## 2021-03-14 LAB — UA/M W/RFLX CULTURE, ROUTINE
Bilirubin, UA: NEGATIVE
Ketones, UA: NEGATIVE
Nitrite, UA: NEGATIVE
Protein,UA: NEGATIVE
RBC, UA: NEGATIVE
Specific Gravity, UA: 1.021 (ref 1.005–1.030)
Urobilinogen, Ur: 0.2 mg/dL (ref 0.2–1.0)
pH, UA: 6 (ref 5.0–7.5)

## 2021-03-14 LAB — URINE CULTURE, REFLEX

## 2021-03-14 LAB — MICROSCOPIC EXAMINATION
Bacteria, UA: NONE SEEN
Casts: NONE SEEN /lpf
RBC, Urine: NONE SEEN /hpf (ref 0–2)

## 2021-03-16 ENCOUNTER — Telehealth: Payer: Self-pay

## 2021-03-16 NOTE — Telephone Encounter (Signed)
Pt called and states she wanted to speak with you in regards to savings on her medication, she found a source and wanted to see if she qualifies for the savings program, please call.

## 2021-03-17 NOTE — Telephone Encounter (Signed)
Patient called in stating that is currently paying over $500 for her Wellbutrin brand name medication. She has found a pharmacy in [place] that is a mail order pharmacy that will charge her about $150 for 90 day supply. She would like to use them however she stated that she will need a hard copy prescription so that she may send to them as they do not have a way for [place] to send electronically. Patient would like to know if we could print a hard copy of the prescription for her to pick up. Please advise?

## 2021-03-17 NOTE — Telephone Encounter (Signed)
If she needs a brand name medication, we can give her the paper prescription if she prefers that.

## 2021-03-22 ENCOUNTER — Other Ambulatory Visit: Payer: Self-pay | Admitting: *Deleted

## 2021-03-22 MED ORDER — WELLBUTRIN XL 150 MG PO TB24: ORAL_TABLET | ORAL | 3 refills | Status: DC

## 2021-03-22 NOTE — Telephone Encounter (Signed)
LVM letting pt know she could pick up paper prescription up at front desk to send to [place] pharmacy

## 2021-03-22 NOTE — Telephone Encounter (Signed)
Patient picked up prescription.

## 2021-03-30 DIAGNOSIS — G4733 Obstructive sleep apnea (adult) (pediatric): Secondary | ICD-10-CM | POA: Diagnosis not present

## 2021-03-31 DIAGNOSIS — H2511 Age-related nuclear cataract, right eye: Secondary | ICD-10-CM | POA: Diagnosis not present

## 2021-03-31 DIAGNOSIS — H25011 Cortical age-related cataract, right eye: Secondary | ICD-10-CM | POA: Diagnosis not present

## 2021-04-12 ENCOUNTER — Ambulatory Visit: Payer: Medicare Other | Admitting: Physical Therapy

## 2021-04-14 DIAGNOSIS — H2512 Age-related nuclear cataract, left eye: Secondary | ICD-10-CM | POA: Diagnosis not present

## 2021-04-14 DIAGNOSIS — H25012 Cortical age-related cataract, left eye: Secondary | ICD-10-CM | POA: Diagnosis not present

## 2021-04-21 ENCOUNTER — Other Ambulatory Visit: Payer: Self-pay

## 2021-04-21 ENCOUNTER — Encounter: Payer: Self-pay | Admitting: *Deleted

## 2021-04-21 ENCOUNTER — Ambulatory Visit: Payer: Medicare Other | Attending: Obstetrics and Gynecology | Admitting: Physical Therapy

## 2021-04-21 DIAGNOSIS — M6281 Muscle weakness (generalized): Secondary | ICD-10-CM | POA: Diagnosis not present

## 2021-04-21 DIAGNOSIS — R293 Abnormal posture: Secondary | ICD-10-CM

## 2021-04-21 NOTE — Patient Instructions (Signed)
Access Code: [email or web address] URL: [email or web address] Date: 04/21/2021 Prepared by: Jari Favre  Exercises Table Lean - 3 x daily - 7 x weekly - 1 sets - 10 reps Seated Hamstring Stretch - 1 x daily - 7 x weekly - 1 sets - 3 reps - 30 sec hold Clamshell - 1 x daily - 7 x weekly - 2 sets - 10 reps Supine Figure 4 Piriformis Stretch - 1 x daily - 7 x weekly - 1 sets - 3 reps - 30 sec hold Supine Pelvic Floor Contraction - 3 x daily - 7 x weekly - 1 sets - 10 reps - 3 sec hold Hooklying Single Knee to Chest - 1 x daily - 7 x weekly - 3 sets - 10 reps

## 2021-04-21 NOTE — Therapy (Signed)
[facility] [address] [place], [place], [address] Phone: [phone]   Fax:  [phone]  Physical Therapy Treatment  Patient Details  Name: Carolyn [name] MRN: [id number] Date of Birth: Mar 10, 1951 Referring Provider (PT): Marylynn Pearson, MD   Encounter Date: 04/21/2021   PT End of Session - 04/21/21 1205     Visit Number 2    Date for PT Re-Evaluation 05/25/21    Authorization Type [employer] medicare    PT Start Time 1152    PT Stop Time 1229    PT Time Calculation (min) 37 min    Activity Tolerance Patient tolerated treatment well    Behavior During Therapy [facility] for tasks assessed/performed             Past Medical History:  Diagnosis Date   Depression    Diabetes mellitus without complication ([place])    Diastolic dysfunction    Hyperlipidemia    Myasthenia gravis ([place])    Patient is Jehovah's Witness    Sleep apnea     Past Surgical History:  Procedure Laterality Date   APPENDECTOMY     BIOPSY  11/11/2020   Procedure: BIOPSY;  Surgeon: Harvel Quale, MD;  [facility];  Service: Gastroenterology;;  duodenum gastric   BUNIONECTOMY     COLONOSCOPY WITH PROPOFOL N/A 11/11/2020   Procedure: COLONOSCOPY WITH PROPOFOL;  Surgeon: Harvel Quale, MD;  [facility];  Service: Gastroenterology;  Laterality: N/A;  11:15   ESOPHAGOGASTRODUODENOSCOPY (EGD) WITH PROPOFOL N/A 11/11/2020   Procedure: ESOPHAGOGASTRODUODENOSCOPY (EGD) WITH PROPOFOL;  Surgeon: Harvel Quale, MD;  [facility];  Service: Gastroenterology;  Laterality: N/A;  to arrive at [place]      There were no vitals filed for this visit.   Subjective Assessment - 04/21/21 1204     Subjective Pt states the urge technique maybe helped a little but not much    Currently in Pain? No/denies                                [facility] Adult PT Treatment/Exercise - 04/21/21 0001       Exercises   Exercises Lumbar      Lumbar Exercises: Stretches   Active Hamstring Stretch Right;Left;30 seconds    Single Knee to Chest Stretch Right;Left;20 seconds    Figure 4 Stretch 3 reps;20 seconds      Lumbar Exercises: Standing   Other Standing Lumbar Exercises lean on table - 10x max contract and rest 3 seconds      Lumbar Exercises: Supine   AB Set Limitations kegel in hooklying with and without ball squeeze 10x      Lumbar Exercises: Sidelying   Clam Right;Left;10 reps                    PT Education - 04/21/21 1230     Education Details Access Code: [email or web address]    Person(s) Educated Patient    Methods Explanation;Demonstration;Tactile cues;Verbal cues;Handout    Comprehension Verbalized understanding;Returned demonstration                 PT Long Term Goals - 04/21/21 1223       PT LONG TERM GOAL #1   Title Pt will report at least 75% less volume of leakage  Status On-going      PT LONG TERM GOAL #2   Title pt will be ind with advanced HEP    Status On-going      PT LONG TERM GOAL #3   Title Pt will be able to void every 2-4 hours    Status On-going      PT LONG TERM GOAL #4   Title Pt will be able to cough and sneeze at least 50% of the time without leakage due to    Status On-going                   Plan - 04/21/21 1227     Clinical Impression Statement Pt has not met goals today first treatment since eval.  She was able to correctly perform exerciises but fatigues quickly.  She was given 3 exercises with stretches to rest between sets.  Pt did better engaging pelvic floor without ball squeeze and needed cues to exhale with exertion.  pt will benefit from skilled PT to work towards functional goals.    PT Treatment/Interventions Taping;Dry needling;Therapeutic activities;Therapeutic exercise;ADLs/Self Care Home  Management;Biofeedback;Cryotherapy;Electrical Stimulation;Moist Heat;Manual techniques;Patient/family education    PT Next Visit Plan f/u on initial HEP; gluteal strength, lumbar and thoracic mobiliity stretches; progress core andpelvic with TC    PT Home Exercise Plan Access Code: [email or web address]    Consulted and Agree with Plan of Care Patient             Patient will benefit from skilled therapeutic intervention in order to improve the following deficits and impairments:  Increased muscle spasms, Increased fascial restricitons, Decreased strength, Decreased coordination, Decreased range of motion, Impaired flexibility, Postural dysfunction  Visit Diagnosis: Muscle weakness (generalized)  Abnormal posture     Problem List Patient Active Problem List   Diagnosis Date Noted   IBS (irritable bowel syndrome) 10/14/2020   History of colonic polyps 10/14/2020   Fatty liver 09/01/2020   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, 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   Posterior vitreous detachment of left eye 03/19/2020   Hypertensive retinopathy, grade 1, left 03/19/2020   Posterior vitreous detachment of both eyes 03/19/2020   Esophageal dysmotility 02/07/2018   Diabetes mellitus, type II ([place]) 10/30/2017   Hypertension 84/85/9276   Diastolic dysfunction 39/43/2003   Depression, recurrent ([place]) 10/30/2017   Obesity 10/30/2017   Hyperlipidemia 10/30/2017   Sleep apnea 10/30/2017   Multiple nevi 10/30/2017   Myasthenia gravis without acute exacerbation ([place]) 10/30/2017   Patient is Jehovah's Witness 10/30/2017    Camillo Flaming Bertie Simien, PT 04/21/2021, 12:33 PM  [facility] [address] [place], [place], [address] Phone: [phone]   Fax:  [phone]  Name: Carolyn [name] MRN: [id number] Date of Birth: October 30, 1951

## 2021-04-28 ENCOUNTER — Encounter: Payer: Self-pay | Admitting: Physical Therapy

## 2021-04-28 ENCOUNTER — Other Ambulatory Visit: Payer: Self-pay

## 2021-04-28 ENCOUNTER — Ambulatory Visit: Payer: Medicare Other | Admitting: Physical Therapy

## 2021-04-28 DIAGNOSIS — R293 Abnormal posture: Secondary | ICD-10-CM | POA: Diagnosis not present

## 2021-04-28 DIAGNOSIS — M6281 Muscle weakness (generalized): Secondary | ICD-10-CM

## 2021-04-28 NOTE — Therapy (Signed)
[facility] [address] [place], [place], [address] Phone: [phone]   Fax:  [phone]  Physical Therapy Treatment  Patient Details  Name: Carolyn [name] MRN: [id number] Date of Birth: 04-19-1951 Referring Provider (PT): Marylynn Pearson, MD   Encounter Date: 04/28/2021   PT End of Session - 04/28/21 1203     Visit Number 3    Date for PT Re-Evaluation 05/25/21    Authorization Type UHC medicare    PT Start Time 1147    PT Stop Time 1227    PT Time Calculation (min) 40 min    Activity Tolerance Patient tolerated treatment well    Behavior During Therapy [facility] for tasks assessed/performed             Past Medical History:  Diagnosis Date   Depression    Diabetes mellitus without complication ([place])    Diastolic dysfunction    Hyperlipidemia    Myasthenia gravis ([place])    Patient is Jehovah's Witness    Sleep apnea     Past Surgical History:  Procedure Laterality Date   APPENDECTOMY     BIOPSY  11/11/2020   Procedure: BIOPSY;  Surgeon: Harvel Quale, MD;  [facility];  Service: Gastroenterology;;  duodenum gastric   BUNIONECTOMY     COLONOSCOPY WITH PROPOFOL N/A 11/11/2020   Procedure: COLONOSCOPY WITH PROPOFOL;  Surgeon: Harvel Quale, MD;  [facility];  Service: Gastroenterology;  Laterality: N/A;  11:15   ESOPHAGOGASTRODUODENOSCOPY (EGD) WITH PROPOFOL N/A 11/11/2020   Procedure: ESOPHAGOGASTRODUODENOSCOPY (EGD) WITH PROPOFOL;  Surgeon: Harvel Quale, MD;  [facility];  Service: Gastroenterology;  Laterality: N/A;  to arrive at [place]      There were no vitals filed for this visit.   Subjective Assessment - 04/28/21 1203     Subjective Pt states she notices a little less leakage.  Maybe 10% less. I had 2x when I didn't make it to the bathroom  and those times I can't stop it once it starts    Currently in Pain? No/denies                               [facility] Adult PT Treatment/Exercise - 04/28/21 0001       Lumbar Exercises: Stretches   Active Hamstring Stretch Right;Left;30 seconds    Lower Trunk Rotation 5 reps;10 seconds    Other Lumbar Stretch Exercise open book pec stretch and thoracic rotation 10x both ways      Lumbar Exercises: Supine   AB Set Limitations kegel in hooklying with and without ball squeeze 10x   holding 5-6 seconds   Clam 20 reps   green loop   Bent Knee Raise 20 reps   green loop                   PT Education - 04/28/21 1231     Education Details Access Code: [email or web address]    Person(s) Educated Patient    Methods Explanation;Demonstration;Tactile cues;Verbal cues;Handout    Comprehension Verbalized understanding;Returned demonstration                 PT Long Term Goals - 04/28/21 1209       PT LONG TERM GOAL #1   Title Pt will report at least 75% less volume of  leakage    Status On-going      PT LONG TERM GOAL #2   Title pt will be ind with advanced HEP    Status On-going      PT LONG TERM GOAL #3   Title Pt will be able to void every 2-4 hours    Baseline every 1.5- 2 hours    Status Partially Met      PT LONG TERM GOAL #4   Title Pt will be able to cough and sneeze at least 50% of the time without leakage due to    Status On-going                   Plan - 04/28/21 1206     Clinical Impression Statement Pt reports she is 10% improved.  Pt was able to add stretches to improve overall trunk mobility needed to activate the core more completely.  Pt did well with exercise progressions.  She is doing better with urge to void techniques as well.  Pt is still doing basic strengthening and will benefit from skilled PT to successfully progress to more challenging exercises    PT Treatment/Interventions Taping;Dry needling;Therapeutic  activities;Therapeutic exercise;ADLs/Self Care Home Management;Biofeedback;Cryotherapy;Electrical Stimulation;Moist Heat;Manual techniques;Patient/family education    PT Next Visit Plan f/u on HEP, elevated sit to stand, mini squat, nustep for warm up, monitor knee pain    PT Home Exercise Plan Access Code: [email or web address]    Consulted and Agree with Plan of Care Patient             Patient will benefit from skilled therapeutic intervention in order to improve the following deficits and impairments:  Increased muscle spasms, Increased fascial restricitons, Decreased strength, Decreased coordination, Decreased range of motion, Impaired flexibility, Postural dysfunction  Visit Diagnosis: Muscle weakness (generalized)  Abnormal posture     Problem List Patient Active Problem List   Diagnosis Date Noted   IBS (irritable bowel syndrome) 10/14/2020   History of colonic polyps 10/14/2020   Fatty liver 09/01/2020   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, 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   Posterior vitreous detachment of left eye 03/19/2020   Hypertensive retinopathy, grade 1, left 03/19/2020   Posterior vitreous detachment of both eyes 03/19/2020   Esophageal dysmotility 02/07/2018   Diabetes mellitus, type II ([place]) 10/30/2017   Hypertension 24/82/5003   Diastolic dysfunction 70/48/8891   Depression, recurrent ([place]) 10/30/2017   Obesity 10/30/2017   Hyperlipidemia 10/30/2017   Sleep apnea 10/30/2017   Multiple nevi 10/30/2017   Myasthenia gravis without acute exacerbation ([place]) 10/30/2017   Patient is Jehovah's Witness 10/30/2017    Camillo Flaming Raigen Jagielski, PT 04/28/2021, 1:34 PM  [facility] [address] [place], [place], [address] Phone: [phone]   Fax:  [phone]  Name: Carolyn [name] MRN: [id number] Date of Birth: 15-Oct-1951

## 2021-04-28 NOTE — Patient Instructions (Signed)
Access Code: [email or web address] URL: [email or web address] Date: 04/28/2021 Prepared by: Jari Favre  Exercises Table Lean - 3 x daily - 7 x weekly - 1 sets - 10 reps Clamshell - 1 x daily - 7 x weekly - 2 sets - 10 reps Supine Pelvic Floor Contraction - 3 x daily - 7 x weekly - 1 sets - 10 reps - 3 sec hold Hooklying Single Knee to Chest - 1 x daily - 7 x weekly - 3 sets - 10 reps Supine Figure 4 Piriformis Stretch - 1 x daily - 7 x weekly - 1 sets - 3 reps - 30 sec hold Supine Lower Trunk Rotation - 1 x daily - 7 x weekly - 10 reps - 1 sets - 5 sec hold Sidelying Thoracic Rotation with Open Book - 1 x daily - 7 x weekly - 5 reps - 1 sets - 10 sec hold Open Book Chest Stretch on Towel Roll - 1 x daily - 7 x weekly - 3 sets - 10 reps Supine Hamstring Stretch - 1 x daily - 7 x weekly - 3 reps - 1 sets - 30 sec hold Hooklying Small March - 1 x daily - 7 x weekly - 10 reps - 2 sets Hooklying Clamshells with Resistance - 1 x daily - 7 x weekly - 2 sets - 10 reps

## 2021-04-29 DIAGNOSIS — G4733 Obstructive sleep apnea (adult) (pediatric): Secondary | ICD-10-CM | POA: Diagnosis not present

## 2021-05-05 ENCOUNTER — Other Ambulatory Visit: Payer: Self-pay

## 2021-05-05 ENCOUNTER — Ambulatory Visit: Payer: Medicare Other | Attending: Obstetrics and Gynecology | Admitting: Physical Therapy

## 2021-05-05 ENCOUNTER — Encounter: Payer: Self-pay | Admitting: Physical Therapy

## 2021-05-05 DIAGNOSIS — M6281 Muscle weakness (generalized): Secondary | ICD-10-CM | POA: Diagnosis not present

## 2021-05-05 DIAGNOSIS — R293 Abnormal posture: Secondary | ICD-10-CM

## 2021-05-05 NOTE — Therapy (Signed)
[facility] [address] [place], [place], [address] Phone: [phone]   Fax:  [phone]  Physical Therapy Treatment  Patient Details  Name: Carolyn [name] MRN: [id number] Date of Birth: [birth year] Referring Provider (PT): Marylynn Pearson, MD   Encounter Date: 05/05/2021   PT End of Session - 05/05/21 1139     Visit Number 4    Date for PT Re-Evaluation 05/25/21    Authorization Type UHC medicare    PT Start Time 1139    PT Stop Time 1218    PT Time Calculation (min) 39 min    Activity Tolerance Patient tolerated treatment well    Behavior During Therapy [facility] for tasks assessed/performed             Past Medical History:  Diagnosis Date   Depression    Diabetes mellitus without complication ([place])    Diastolic dysfunction    Hyperlipidemia    Myasthenia gravis ([place])    Patient is Jehovah's Witness    Sleep apnea     Past Surgical History:  Procedure Laterality Date   APPENDECTOMY     BIOPSY  11/11/2020   Procedure: BIOPSY;  Surgeon: Harvel Quale, MD;  [facility];  Service: Gastroenterology;;  duodenum gastric   BUNIONECTOMY     COLONOSCOPY WITH PROPOFOL N/A 11/11/2020   Procedure: COLONOSCOPY WITH PROPOFOL;  Surgeon: Harvel Quale, MD;  [facility];  Service: Gastroenterology;  Laterality: N/A;  11:15   ESOPHAGOGASTRODUODENOSCOPY (EGD) WITH PROPOFOL N/A 11/11/2020   Procedure: ESOPHAGOGASTRODUODENOSCOPY (EGD) WITH PROPOFOL;  Surgeon: Harvel Quale, MD;  [facility];  Service: Gastroenterology;  Laterality: N/A;  to arrive at [place]      There were no vitals filed for this visit.   Subjective Assessment - 05/05/21 1228     Subjective Much better.  No major leaks this week    Currently in Pain? No/denies                                [facility] Adult PT Treatment/Exercise - 05/05/21 0001       Lumbar Exercises: Stretches   Active Hamstring Stretch Right;Left;30 seconds    Single Knee to Chest Stretch Right;Left;20 seconds    Lower Trunk Rotation 5 reps;10 seconds    Figure 4 Stretch 3 reps;20 seconds      Lumbar Exercises: Aerobic   Nustep L1 x 5 min PT present for status update and cues to engage core      Lumbar Exercises: Standing   Shoulder Extension Strengthening;Both;20 reps;Theraband    Theraband Level (Shoulder Extension) Level 1 (Yellow)    Shoulder Extension Limitations fwd flex walk with yellow 20x    Other Standing Lumbar Exercises lean forward with 1lb weight lift      Lumbar Exercises: Supine   AB Set Limitations kegel in hooklying with and without ball squeeze 10x   holding 5-6 seconds, cue to breathe   Bridge with [employer] 10 reps                         PT Long Term Goals - 05/05/21 1200       PT LONG TERM GOAL #1   Title Pt will report at least 75% less volume of leakage  Baseline I haven't had any full incontinence for one week and it was almost every day    Status On-going      PT LONG TERM GOAL #2   Title pt will be ind with advanced HEP    Status On-going      PT LONG TERM GOAL #3   Title Pt will be able to void every 2-4 hours    Baseline every 2 hours unless I drink a lot at one time    Status Achieved      PT LONG TERM GOAL #4   Title Pt will be able to cough and sneeze at least 50% of the time without leakage due to    Baseline coughing causes leakage every time if I don't cross my legs, not if I do    Status Partially Met                   Plan - 05/05/21 1212     Clinical Impression Statement Pt continues to make progress and has not had any episodes of full incontinence this week. Pt progressed to being able to breathe with kegel in supine and doing some standing exercise. Pt did well with new exercises  and educated in using this postition for knack when coughing and sneezing.    PT Treatment/Interventions Taping;Dry needling;Therapeutic activities;Therapeutic exercise;ADLs/Self Care Home Management;Biofeedback;Cryotherapy;Electrical Stimulation;Moist Heat;Manual techniques;Patient/family education    PT Next Visit Plan f/u on HEP, nustep for warm up, monitor knee pain, elevated mini squat using table touch for reference; standing hip    PT Home Exercise Plan Access Code: [email or web address]    Consulted and Agree with Plan of Care Patient             Patient will benefit from skilled therapeutic intervention in order to improve the following deficits and impairments:  Increased muscle spasms, Increased fascial restricitons, Decreased strength, Decreased coordination, Decreased range of motion, Impaired flexibility, Postural dysfunction  Visit Diagnosis: Abnormal posture  Muscle weakness (generalized)     Problem List Patient Active Problem List   Diagnosis Date Noted   IBS (irritable bowel syndrome) 10/14/2020   History of colonic polyps 10/14/2020   Fatty liver 09/01/2020   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, 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   Posterior vitreous detachment of left eye 03/19/2020   Hypertensive retinopathy, grade 1, left 03/19/2020   Posterior vitreous detachment of both eyes 03/19/2020   Esophageal dysmotility 02/07/2018   Diabetes mellitus, type II ([place]) 10/30/2017   Hypertension 56/31/4970   Diastolic dysfunction 26/37/8588   Depression, recurrent ([place]) 10/30/2017   Obesity 10/30/2017   Hyperlipidemia 10/30/2017   Sleep apnea 10/30/2017   Multiple nevi 10/30/2017   Myasthenia gravis without acute exacerbation ([place]) 10/30/2017   Patient is Jehovah's Witness 10/30/2017    Camillo Flaming Vesna Kable, PT 05/05/2021, 12:31 PM  [facility] [address], [place], [place], [address] Phone: [phone]   Fax:  [phone]  Name: Carolyn [name] MRN: [id number] Date of Birth: 1951-02-19

## 2021-05-10 ENCOUNTER — Other Ambulatory Visit: Payer: Self-pay

## 2021-05-10 ENCOUNTER — Encounter: Payer: Self-pay | Admitting: Podiatry

## 2021-05-10 ENCOUNTER — Ambulatory Visit: Payer: Medicare Other | Admitting: Podiatry

## 2021-05-10 DIAGNOSIS — M779 Enthesopathy, unspecified: Secondary | ICD-10-CM | POA: Diagnosis not present

## 2021-05-10 DIAGNOSIS — M792 Neuralgia and neuritis, unspecified: Secondary | ICD-10-CM | POA: Insufficient documentation

## 2021-05-10 NOTE — Progress Notes (Signed)
This patient presents to the office with diffuse pain noted in her left forefoot greater than her right foot.  She denies trauma or injury to the feet.  She says her pain has been worsening the last few months.  She is concerned about her feet since she is diabetic.  She also says she has numbness radiating on the outside of her left foot.  This is occasional  pain present but is not severe.  She presents to the office for evaluation and treatment.    Vascular  Dorsalis pedis and posterior tibial pulses are palpable  B/L.  Capillary return  WNL.  Temperature gradient is  WNL.  Skin turgor  WNL  Sensorium  Senn Weinstein monofilament wire  WNL. Normal tactile sensation.  Nail Exam  Patient has normal nails with no evidence of bacterial or fungal infection.  Orthopedic  Exam  Muscle tone and muscle strength  WNL.  No limitations of motion feet  B/L.  No crepitus or joint effusion noted.  Foot type is unremarkable and digits show no abnormalities.  Bony prominences are unremarkable.  Hypermobile forefeet  B/L.  Palpable pain plantar pressure met heads  left.  Skin  No open lesions.  Normal skin texture and turgor.   Capsulitis left forefoot due to hypermobility.  Neuralgia.  IE.  Recommend spenco insoles to control hypermobility.  Discussed nerve pain left heel area.  RTC prn   Gardiner Barefoot DPM

## 2021-05-12 ENCOUNTER — Other Ambulatory Visit: Payer: Self-pay

## 2021-05-12 ENCOUNTER — Encounter: Payer: Self-pay | Admitting: Physical Therapy

## 2021-05-12 ENCOUNTER — Ambulatory Visit: Payer: Medicare Other | Admitting: Physical Therapy

## 2021-05-12 DIAGNOSIS — R293 Abnormal posture: Secondary | ICD-10-CM | POA: Diagnosis not present

## 2021-05-12 DIAGNOSIS — M6281 Muscle weakness (generalized): Secondary | ICD-10-CM | POA: Diagnosis not present

## 2021-05-12 NOTE — Therapy (Signed)
[facility] [address], [place], [place], [address] Phone: [phone]   Fax:  [phone]  Physical Therapy Treatment  Patient Details  Name: Carolyn [name] MRN: [id number] Date of Birth: 1951/04/14 Referring Provider (PT): Marylynn Pearson, MD   Encounter Date: 05/12/2021   PT End of Session - 05/12/21 1150     Visit Number 5    Date for PT Re-Evaluation 05/25/21    Authorization Type UHC medicare    PT Start Time 3013    PT Stop Time 1228    PT Time Calculation (min) 42 min    Activity Tolerance Patient tolerated treatment well    Behavior During Therapy [facility] for tasks assessed/performed             Past Medical History:  Diagnosis Date   Depression    Diabetes mellitus without complication ([place])    Diastolic dysfunction    Hyperlipidemia    Myasthenia gravis ([place])    Patient is Jehovah's Witness    Sleep apnea     Past Surgical History:  Procedure Laterality Date   APPENDECTOMY     BIOPSY  11/11/2020   Procedure: BIOPSY;  Surgeon: Harvel Quale, MD;  [facility];  Service: Gastroenterology;;  duodenum gastric   BUNIONECTOMY     COLONOSCOPY WITH PROPOFOL N/A 11/11/2020   Procedure: COLONOSCOPY WITH PROPOFOL;  Surgeon: Harvel Quale, MD;  [facility];  Service: Gastroenterology;  Laterality: N/A;  11:15   ESOPHAGOGASTRODUODENOSCOPY (EGD) WITH PROPOFOL N/A 11/11/2020   Procedure: ESOPHAGOGASTRODUODENOSCOPY (EGD) WITH PROPOFOL;  Surgeon: Harvel Quale, MD;  [facility];  Service: Gastroenterology;  Laterality: N/A;  to arrive at [place]      There were no vitals filed for this visit.   Subjective Assessment - 05/12/21 1149     Subjective Pt had a lot going on at home and stress so not as much of the exercises got done.  Pt is getting flooring  done due to water damage.    Currently in Pain? No/denies                               [facility] Adult PT Treatment/Exercise - 05/12/21 0001       Lumbar Exercises: Stretches   Active Hamstring Stretch Right;Left;30 seconds    Hip Flexor Stretch Right;Left;3 reps;20 seconds      Lumbar Exercises: Aerobic   Nustep L1 x 4 min PT present for status update and cues to engage core      Lumbar Exercises: Standing   Shoulder Extension Strengthening;Both;20 reps;Theraband    Theraband Level (Shoulder Extension) Level 3 (Green)    Shoulder Extension Limitations standing on foam mat    Other Standing Lumbar Exercises standing staggered on foam mat and lift 5lb    Other Standing Lumbar Exercises hip abduction tapping green cones front and side - 10x      Lumbar Exercises: Seated   Long Arc Quad on Chair Strengthening;Both;20 reps   ball squeeze with kegel   Sit to Stand 10 reps   tapping elevated mat table   Other Seated Lumbar Exercises horizontal abduction red and ER red band - 20x  PT Long Term Goals - 05/12/21 1151       PT LONG TERM GOAL #1   Title Pt will report at least 75% less volume of leakage    Baseline I had one episode of full incontinence      PT LONG TERM GOAL #2   Title pt will be ind with advanced HEP      PT LONG TERM GOAL #3   Title Pt will be able to void every 2-4 hours    Baseline every 2 hours unless I drink a lot at one time    Status Achieved      PT LONG TERM GOAL #4   Title Pt will be able to cough and sneeze at least 50% of the time without leakage due to    Baseline a little less maybe 50% less    Status Achieved                   Plan - 05/12/21 1227     Clinical Impression Statement Today's session focused on core with more advanced hip and UE exercises to add more challenge to the pelvic floor.  Pt did well but needed cues to keep pelvis neutral during the standing exercises and  shoulder and upper back cues for posture in sitting.  Pt will benefit from skilled PT as she is making slow and steady progress.  No new exercises added due to still working on the ones she has.    PT Treatment/Interventions Taping;Dry needling;Therapeutic activities;Therapeutic exercise;ADLs/Self Care Home Management;Biofeedback;Cryotherapy;Electrical Stimulation;Moist Heat;Manual techniques;Patient/family education    PT Next Visit Plan f/u on HEP, nustep for warm up, monitor knee pain, elevated mini squat using table touch for reference; standing hip    PT Home Exercise Plan Access Code: [email or web address]    Consulted and Agree with Plan of Care Patient             Patient will benefit from skilled therapeutic intervention in order to improve the following deficits and impairments:  Increased muscle spasms, Increased fascial restricitons, Decreased strength, Decreased coordination, Decreased range of motion, Impaired flexibility, Postural dysfunction  Visit Diagnosis: Abnormal posture  Muscle weakness (generalized)     Problem List Patient Active Problem List   Diagnosis Date Noted   Neuralgia 05/10/2021   IBS (irritable bowel syndrome) 10/14/2020   History of colonic polyps 10/14/2020   Fatty liver 09/01/2020   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, 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   Posterior vitreous detachment of left eye 03/19/2020   Hypertensive retinopathy, grade 1, left 03/19/2020   Posterior vitreous detachment of both eyes 03/19/2020   Esophageal dysmotility 02/07/2018   Diabetes mellitus, type II ([place]) 10/30/2017   Hypertension 25/03/3975   Diastolic dysfunction 73/41/9379   Depression, recurrent ([place]) 10/30/2017   Obesity 10/30/2017   Hyperlipidemia 10/30/2017   Sleep apnea 10/30/2017   Multiple nevi 10/30/2017   Myasthenia gravis without acute exacerbation ([place]) 10/30/2017   Patient is Jehovah's Witness 10/30/2017     Camillo Flaming Jenita [place], PT 05/12/2021, 12:55 PM  [facility] [address], [place], [place], [address] Phone: [phone]   Fax:  [phone]  Name: Carolyn [name] MRN: [id number] Date of Birth: Dec 11, 1950

## 2021-05-18 ENCOUNTER — Emergency Department (HOSPITAL_COMMUNITY): Payer: Medicare Other

## 2021-05-18 ENCOUNTER — Telehealth: Payer: Self-pay

## 2021-05-18 ENCOUNTER — Other Ambulatory Visit: Payer: Self-pay

## 2021-05-18 ENCOUNTER — Encounter (HOSPITAL_COMMUNITY): Payer: Self-pay

## 2021-05-18 ENCOUNTER — Emergency Department (HOSPITAL_COMMUNITY)
Admission: EM | Admit: 2021-05-18 | Discharge: 2021-05-18 | Disposition: A | Discharge status: Home / Self Care | Payer: Medicare Other | Attending: Emergency Medicine | Admitting: Emergency Medicine

## 2021-05-18 DIAGNOSIS — R5383 Other fatigue: Secondary | ICD-10-CM | POA: Insufficient documentation

## 2021-05-18 DIAGNOSIS — I10 Essential (primary) hypertension: Secondary | ICD-10-CM | POA: Diagnosis not present

## 2021-05-18 DIAGNOSIS — Z20822 Contact with and (suspected) exposure to covid-19: Secondary | ICD-10-CM | POA: Diagnosis not present

## 2021-05-18 DIAGNOSIS — I2699 Other pulmonary embolism without acute cor pulmonale: Secondary | ICD-10-CM

## 2021-05-18 DIAGNOSIS — Z7984 Long term (current) use of oral hypoglycemic drugs: Secondary | ICD-10-CM | POA: Diagnosis not present

## 2021-05-18 DIAGNOSIS — Z79899 Other long term (current) drug therapy: Secondary | ICD-10-CM | POA: Diagnosis not present

## 2021-05-18 DIAGNOSIS — E119 Type 2 diabetes mellitus without complications: Secondary | ICD-10-CM | POA: Diagnosis not present

## 2021-05-18 DIAGNOSIS — R0789 Other chest pain: Secondary | ICD-10-CM | POA: Diagnosis not present

## 2021-05-18 DIAGNOSIS — I2693 Single subsegmental pulmonary embolism without acute cor pulmonale: Secondary | ICD-10-CM | POA: Insufficient documentation

## 2021-05-18 DIAGNOSIS — R079 Chest pain, unspecified: Secondary | ICD-10-CM | POA: Diagnosis not present

## 2021-05-18 HISTORY — DX: Other pulmonary embolism without acute cor pulmonale: I26.99

## 2021-05-18 LAB — RESP PANEL BY RT-PCR (FLU A&B, COVID) ARPGX2
Influenza A by PCR: NEGATIVE
Influenza B by PCR: NEGATIVE
SARS Coronavirus 2 by RT PCR: NEGATIVE

## 2021-05-18 LAB — BASIC METABOLIC PANEL
Anion gap: 11 (ref 5–15)
BUN: 10 mg/dL (ref 8–23)
CO2: 24 mmol/L (ref 22–32)
Calcium: 9 mg/dL (ref 8.9–10.3)
Chloride: 101 mmol/L (ref 98–111)
Creatinine, Ser: 0.78 mg/dL (ref 0.44–1.00)
GFR, Estimated: >60 mL/min (ref >60)
Glucose, Bld: 141 mg/dL — ABNORMAL HIGH (ref 70–99)
Potassium: 4.4 mmol/L (ref 3.5–5.1)
Sodium: 136 mmol/L (ref 135–145)

## 2021-05-18 LAB — TROPONIN I (HIGH SENSITIVITY)
Troponin I (High Sensitivity): 2 ng/L (ref <18)
Troponin I (High Sensitivity): 2 ng/L (ref <18)

## 2021-05-18 LAB — CBC
HCT: 46.6 % — ABNORMAL HIGH (ref 36.0–46.0)
Hemoglobin: 16.1 g/dL — ABNORMAL HIGH (ref 12.0–15.0)
MCH: 32.3 pg (ref 26.0–34.0)
MCHC: 34.5 g/dL (ref 30.0–36.0)
MCV: 93.4 fL (ref 80.0–100.0)
Platelets: 204 10*3/uL (ref 150–400)
RBC: 4.99 MIL/uL (ref 3.87–5.11)
RDW: 12.4 % (ref 11.5–15.5)
WBC: 6.6 10*3/uL (ref 4.0–10.5)
nRBC: 0 % (ref 0.0–0.2)

## 2021-05-18 LAB — D-DIMER, QUANTITATIVE: D-Dimer, Quant: 0.8 ug/mL-FEU — ABNORMAL HIGH (ref 0.00–0.50)

## 2021-05-18 LAB — CBG MONITORING, ED: Glucose-Capillary: 133 mg/dL — ABNORMAL HIGH (ref 70–99)

## 2021-05-18 MED ORDER — IOHEXOL 350 MG/ML SOLN
75.0000 mL | Freq: Once | INTRAVENOUS | Status: AC | PRN
  Administered 2021-05-18: 75 mL via INTRAVENOUS

## 2021-05-18 MED ORDER — RIVAROXABAN 15 MG PO TABS
15.0000 mg | ORAL_TABLET | Freq: Two times a day (BID) | ORAL | Status: DC
Start: 2021-05-18 — End: 2021-05-18
  Administered 2021-05-18: 15 mg via ORAL
  Filled 2021-05-18: qty 1

## 2021-05-18 MED ORDER — ASPIRIN 81 MG PO CHEW
324.0000 mg | CHEWABLE_TABLET | Freq: Once | ORAL | Status: AC
  Administered 2021-05-18: 324 mg via ORAL
  Filled 2021-05-18: qty 4

## 2021-05-18 MED ORDER — RIVAROXABAN (XARELTO) VTE STARTER PACK (15 & 20 MG): ORAL_TABLET | ORAL | 0 refills | Status: DC

## 2021-05-18 MED ORDER — RIVAROXABAN 20 MG PO TABS: 20.0000 mg | ORAL_TABLET | Freq: Every day | ORAL | Status: DC

## 2021-05-18 MED ORDER — RIVAROXABAN (XARELTO) EDUCATION KIT FOR DVT/PE PATIENTS: PACK | Freq: Once | Status: DC

## 2021-05-18 NOTE — Telephone Encounter (Signed)
Thank you. She needs to r/o a heart issue for sure.

## 2021-05-18 NOTE — ED Notes (Signed)
Pharmacy called for discharge teaching on [place].

## 2021-05-18 NOTE — ED Triage Notes (Signed)
Chest and back pain since last night. Pt states she is weak and does not feel well.  Denies pain at this time.  Skin warm and dry,

## 2021-05-18 NOTE — Discharge Instructions (Signed)
The CT scan of the chest did show a small blood clot in the lungs.  Start taking the medications as we discussed.  Follow-up with your doctor to discuss further evaluation and determine the cause of this blood clot.  Return as needed for worsening symptoms.

## 2021-05-18 NOTE — Telephone Encounter (Signed)
Pt states she started hurting last night in her chest through to her back and she took an antacid and drinking water. This went on for about 4-5 hrs. And she finally was able to go to sleep propped up. She is having back pain and chest pain still and does not feel well, very fatigued. Her blood pressure was about 148/90 when she checked it. I advised she be seen at the ER for cardiac workup.

## 2021-05-18 NOTE — ED Provider Notes (Signed)
[place] Provider Note   CSN: [id number] Arrival date & time: 05/18/21  1023     History Chief Complaint  Patient presents with   Chest Pain    Carolyn [name] is a 70 y.o. female.   Chest Pain Associated symptoms: fatigue   Associated symptoms: no fever    HPI: A 70 year old patient with a history of treated diabetes, hypertension and hypercholesterolemia presents for evaluation of chest pain. Initial onset of pain was more than 6 hours ago. The patient's chest pain is not worse with exertion. The patient's chest pain is middle- or left-sided, is not well-localized, is not described as heaviness/pressure/tightness, is not sharp and does not radiate to the arms/jaw/neck. The patient does not complain of nausea and denies diaphoresis. The patient has no history of stroke, has no history of peripheral artery disease, has not smoked in the past 90 days, has no relevant family history of coronary artery disease (first degree relative at less than age 73) and does not have an elevated BMI (>=30).  Patient has noticed that her symptoms get worse when she tries to walk around. 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   Posterior vitreous detachment of left eye 03/19/2020   Hypertensive retinopathy, grade 1, left 03/19/2020   Posterior vitreous detachment of both eyes 03/19/2020   Esophageal dysmotility 02/07/2018   Diabetes mellitus, type II ([place]) 10/30/2017   Hypertension  25/85/2778   Diastolic dysfunction 24/23/5361   Depression, recurrent ([place]) 10/30/2017   Obesity 10/30/2017   Hyperlipidemia 10/30/2017   Sleep apnea 10/30/2017   Multiple nevi 10/30/2017   Myasthenia gravis without acute exacerbation ([place]) 10/30/2017   Patient is Jehovah's Witness 10/30/2017    Past Surgical History:  Procedure Laterality Date   APPENDECTOMY     BIOPSY  11/11/2020   Procedure: BIOPSY;  Surgeon: Harvel Quale, MD;  [facility];  Service: Gastroenterology;;  duodenum gastric   BUNIONECTOMY     COLONOSCOPY WITH PROPOFOL N/A 11/11/2020   Procedure: COLONOSCOPY WITH PROPOFOL;  Surgeon: Harvel Quale, MD;  [facility];  Service: Gastroenterology;  Laterality: N/A;  11:15   ESOPHAGOGASTRODUODENOSCOPY (EGD) WITH PROPOFOL N/A 11/11/2020   Procedure: ESOPHAGOGASTRODUODENOSCOPY (EGD) WITH PROPOFOL;  Surgeon: Harvel Quale, MD;  [facility];  Service: Gastroenterology;  Laterality: N/A;  to arrive at [place]       OB History   No obstetric history on file.     Family History  Problem Relation Age of Onset   Arthritis Mother    Depression Mother    Diabetes Mother    Hypertension Mother    Alcohol abuse Father    Hypertension Father    Heart disease Father 63       Massive MI   Diabetes Brother    Diabetes Brother    Heart disease Brother  Diabetes Brother        Type I     Social History   Tobacco Use   Smoking status: Never   Smokeless tobacco: Never  Vaping Use   Vaping Use: Never used  Substance Use Topics   Alcohol use: Yes    Comment: socially   Drug use: No    Home Medications Prior to Admission medications   Medication Sig Start Date End Date Taking? Authorizing Provider  Cholecalciferol (VITAMIN D3) 125 MCG (5000 UT) TABS Take 5,000 Units by mouth daily.   Yes [provider]   dapagliflozin propanediol (FARXIGA) 10 MG TABS tablet Take 1 tablet (10 mg total) by mouth daily before breakfast. 01/04/21  Yes [place], Modena Nunnery, MD  dicyclomine (BENTYL) 10 MG capsule Take 1 capsule (10 mg total) by mouth every 12 (twelve) hours as needed for spasms. 10/14/20  Yes Harvel Quale, MD  Glycerin-Hypromellose-PEG 400 (DRY EYE RELIEF DROPS) 0.2-0.2-1 % SOLN Place 1 drop into both eyes daily as needed (Dry eye).   Yes [provider]  icosapent Ethyl (VASCEPA) 1 g capsule TAKE 2 CAPSULES BY MOUTH TWICE DAILY WITH A MEAL Patient taking differently: Take 2 g by mouth 2 (two) times daily. 12/07/20  Yes [place], Modena Nunnery, MD  ketoconazole (NIZORAL) 2 % cream Apply 1 application topically daily. Patient taking differently: Apply 1 application topically daily as needed for irritation. 03/10/21  Yes Lindell Spar, MD  losartan-hydrochlorothiazide (HYZAAR) 100-12.5 MG tablet Take 1 tablet by mouth daily. 03/10/21  Yes Lindell Spar, MD  metoprolol succinate (TOPROL-XL) 25 MG 24 hr tablet Take 1 tablet (25 mg total) by mouth daily. TAKE 1 TABLET BY MOUTH DAILY Patient taking differently: Take 25 mg by mouth daily. 03/10/21  Yes Lindell Spar, MD  Multiple Vitamins-Minerals [facility] COMPLETE) CAPS Take 1 capsule by mouth daily.   Yes [provider]  RIVAROXABAN [employer]veda Reasons) VTE STARTER PACK (15 & 20 MG) Follow package directions: Take one $Remove'15mg'TyrjbJe$  tablet by mouth twice a day. On day 22, switch to one $Remo'20mg'BriPS$  tablet once a day. Take with food. 05/18/21  Yes Dorie Rank, MD  [facility] XL 150 MG 24 hr tablet TAKE 1 TABLET BY MOUTH ONCE DAILY Patient taking differently: Take 150 mg by mouth daily. 03/22/21  Yes Lindell Spar, MD  Blood Glucose Monitoring Suppl (BLOOD GLUCOSE SYSTEM PAK) KIT Use as directed to monitor FSBS 2x daily. Dx: E11.9 10/30/17   Alycia Rossetti, MD  fluconazole (DIFLUCAN) 150 MG tablet Take 1 tablet once by mouth. If persistent symptoms, repeat the dose  after 3 days. Patient not taking: No sig reported 03/12/21   Lindell Spar, MD  [facility] VERIO test strip USE TO TEST BLOOD SUGAR TWICE DAILY 07/20/20   Alycia Rossetti, MD  Pitavastatin Calcium (LIVALO) 2 MG TABS Take 1 tablet at bedtime for cholesterol Patient not taking: No sig reported 01/01/21   Alycia Rossetti, MD    Allergies    Prozac [fluoxetine hcl], Codeine, Other, and Sulfa antibiotics  Review of Systems   Review of Systems  Constitutional:  Positive for fatigue. Negative for fever.  Cardiovascular:  Positive for chest pain.   Physical Exam Updated Vital Signs BP 137/73   Pulse 66   Temp 98.4 F (36.9 C) (Oral)   Resp 18   Ht 1.6 m ($Remo'5\' 3"'ZlRXq$ )   Wt 95 kg   SpO2 96%   BMI 37.10 kg/m   Physical Exam  ED Results /  Procedures / Treatments   Labs (all labs ordered are listed, but only abnormal results are displayed) Labs Reviewed  BASIC METABOLIC PANEL - Abnormal; Notable for the following components:      Result Value   Glucose, Bld 141 (*)    All other components within normal limits  CBC - Abnormal; Notable for the following components:   Hemoglobin 16.1 (*)    HCT 46.6 (*)    All other components within normal limits  D-DIMER, QUANTITATIVE - Abnormal; Notable for the following components:   D-Dimer, Quant 0.80 (*)    All other components within normal limits  CBG MONITORING, ED - Abnormal; Notable for the following components:   Glucose-Capillary 133 (*)    All other components within normal limits  RESP PANEL BY RT-PCR (FLU A&B, COVID) ARPGX2  TROPONIN I (HIGH SENSITIVITY)  TROPONIN I (HIGH SENSITIVITY)    EKG EKG Interpretation  Date/Time:  Tuesday May 18 2021 10:39:43 EDT Ventricular Rate:  74 PR Interval:  169 QRS Duration: 84 QT Interval:  387 QTC Calculation: 430 R Axis:   29 Text Interpretation: Sinus rhythm Low voltage, precordial leads Borderline T abnormalities, anterior leads Baseline wander in lead(s) V5 No old tracing to compare  Confirmed by Dorie Rank [phone]) on 05/18/2021 3:27:36 PM  Radiology CT Angio Chest PE W and/or Wo Contrast  Result Date: 05/18/2021 CLINICAL DATA:  70 year old female with history of chest and back pain since yesterday evening. EXAM: CT ANGIOGRAPHY CHEST WITH CONTRAST TECHNIQUE: Multidetector CT imaging of the chest was performed using the standard protocol during bolus administration of intravenous contrast. Multiplanar CT image reconstructions and MIPs were obtained to evaluate the vascular anatomy. CONTRAST:  49mL OMNIPAQUE IOHEXOL 350 MG/ML SOLN COMPARISON:  Cardiac CT 02/26/2019. FINDINGS: Cardiovascular: There is a small nonobstructive filling defect in distal segmental and subsegmental sized branches to the right lower lobe best appreciated on axial images 184-189 of series 6. No other larger also central or lobar pulmonary emboli are noted. Heart size is normal. There is no significant pericardial fluid, thickening or pericardial calcification. Aortic atherosclerosis. Mediastinum/Nodes: No pathologically enlarged mediastinal or hilar lymph nodes. Please note that accurate exclusion of hilar adenopathy is limited on noncontrast CT scans. Esophagus is unremarkable in appearance. No axillary lymphadenopathy. Lungs/Pleura: No acute consolidative airspace disease. No pleural effusions. No suspicious appearing pulmonary nodules or masses are noted. Upper Abdomen: Unremarkable. Musculoskeletal: There are no aggressive appearing lytic or blastic lesions noted in the visualized portions of the skeleton. Review of the MIP images confirms the above findings. IMPRESSION: 1. Study is positive for a small nonobstructive distal segmental and subsegmental sized embolus in the right lower lobe. 2. No other acute findings are noted elsewhere in the thorax. 3. Aortic atherosclerosis. Aortic Atherosclerosis (ICD10-I70.0). Electronically Signed   By: Vinnie Langton M.D.   On: 05/18/2021 14:58   DG Chest Portable 1  View  Result Date: 05/18/2021 CLINICAL DATA:  Chest and back pain. EXAM: PORTABLE CHEST 1 VIEW COMPARISON:  Chest x-ray dated December 12, 2017. FINDINGS: The heart size and mediastinal contours are within normal limits. Both lungs are clear. The visualized skeletal structures are unremarkable. IMPRESSION: No active disease. Electronically Signed   By: Titus Dubin M.D.   On: 05/18/2021 11:56    Procedures Procedures   Medications Ordered in ED Medications  rivaroxaban (XARELTO) Education Kit for DVT/PE patients (has no administration in time range)  Rivaroxaban (XARELTO) tablet 15 mg (has no administration in time range)  Followed by  rivaroxaban (XARELTO) tablet 20 mg (has no administration in time range)  aspirin chewable tablet 324 mg (324 mg Oral Given 05/18/21 1144)  iohexol (OMNIPAQUE) 350 MG/ML injection 75 mL (75 mLs Intravenous Contrast Given 05/18/21 1418)    ED Course  I have reviewed the triage vital signs and the nursing notes.  Pertinent labs & imaging results that were available during my care of the patient were reviewed by me and considered in my medical decision making (see chart for details).  Clinical Course as of 05/18/21 1529  Tue May 18, 2021  1338 D-dimer slightly increased at 0.8. [JK]  0981 CBC metabolic panel normal.  Covid is negative.  troponin is normal [JK]  1510 Patient CT scan shows a small distal segmental and small segmental pulmonary embolism [JK]    Clinical Course User Index [JK] Dorie Rank, MD   MDM Rules/Calculators/A&P HEAR Score: 5                        Patient presented to the ED for evaluation of chest discomfort and some mild shortness of breath.  Patient appeared well on exam.  She is not tachycardic or tachypneic.  She has no oxygen requirement.  Acute coronary syndrome unstable angina was considered.  Pulmonary embolism was also a consideration.  X-ray did not show evidence of pneumonia.  Serial troponins were normal.  No signs to  suggest heart strain or acute coronary syndrome.  Patient's D-dimer was slightly elevated so a CT scan was performed patient does have a small PE noted on her CT scan.  Patient is not showing signs of heart strain.  The pulmonary embolism is very small.  She is asymptomatic right now.  Discussed options of inpatient treatment versus outpatient treatment and close follow-up.  I do think she is a candidate for oral anticoagulant treatment.  Patient is interested in going home.  Will discharge home on a course of xarelto.  First dose ordered here and will ask pharmacy to help educate Final Clinical Impression(s) / ED Diagnoses Final diagnoses:  Single subsegmental pulmonary embolism without acute cor pulmonale ([place])    Rx / DC Orders ED Discharge Orders          Ordered    RIVAROXABAN (XARELTO) VTE STARTER PACK (15 & 20 MG)        05/18/21 1521             Dorie Rank, MD 05/18/21 1529

## 2021-05-19 ENCOUNTER — Encounter: Payer: Medicare Other | Admitting: Physical Therapy

## 2021-05-26 ENCOUNTER — Other Ambulatory Visit: Payer: Self-pay

## 2021-05-26 ENCOUNTER — Encounter: Payer: Self-pay | Admitting: Physical Therapy

## 2021-05-26 ENCOUNTER — Ambulatory Visit: Payer: Medicare Other | Admitting: Physical Therapy

## 2021-05-26 DIAGNOSIS — R293 Abnormal posture: Secondary | ICD-10-CM

## 2021-05-26 DIAGNOSIS — M6281 Muscle weakness (generalized): Secondary | ICD-10-CM | POA: Diagnosis not present

## 2021-05-26 NOTE — Patient Instructions (Signed)
Access Code: [email or web address] URL: [email or web address] Date: 05/26/2021 Prepared by: Jari Favre  Exercises Table Lean - 3 x daily - 7 x weekly - 1 sets - 10 reps Clamshell - 1 x daily - 7 x weekly - 2 sets - 10 reps Supine Pelvic Floor Contraction - 3 x daily - 7 x weekly - 1 sets - 10 reps - 3 sec hold Hooklying Single Knee to Chest - 1 x daily - 7 x weekly - 3 sets - 10 reps Supine Figure 4 Piriformis Stretch - 1 x daily - 7 x weekly - 1 sets - 3 reps - 30 sec hold Supine Lower Trunk Rotation - 1 x daily - 7 x weekly - 10 reps - 1 sets - 5 sec hold Sidelying Thoracic Rotation with Open Book - 1 x daily - 7 x weekly - 5 reps - 1 sets - 10 sec hold Open Book Chest Stretch on Towel Roll - 1 x daily - 7 x weekly - 3 sets - 10 reps Supine Hamstring Stretch - 1 x daily - 7 x weekly - 3 reps - 1 sets - 30 sec hold Hooklying Small March - 1 x daily - 7 x weekly - 10 reps - 2 sets Hooklying Clamshells with Resistance - 1 x daily - 7 x weekly - 2 sets - 10 reps Staggered Stance Biceps Curl with Dumbbells - 1 x daily - 7 x weekly - 2 sets - 10 reps Mini Squat with Pelvic Floor Contraction - 1 x daily - 7 x weekly - 2 sets - 10 reps

## 2021-05-26 NOTE — Therapy (Signed)
[facility] [address], [place], [place], [address] Phone: [phone]   Fax:  [phone]  Physical Therapy Treatment  Patient Details  Name: Carolyn [name] MRN: [id number] Date of Birth: Aug 25, 1951 Referring Provider (PT): Marylynn Pearson, MD   Encounter Date: 05/26/2021   PT End of Session - 05/26/21 1148     Visit Number 6    Date for PT Re-Evaluation 07/21/21    Authorization Type [employer] medicare    PT Start Time 1143    PT Stop Time 2878    PT Time Calculation (min) 41 min    Activity Tolerance Patient tolerated treatment well    Behavior During Therapy [facility] for tasks assessed/performed             Past Medical History:  Diagnosis Date   Depression    Diabetes mellitus without complication ([place])    Diastolic dysfunction    Hyperlipidemia    Myasthenia gravis ([place])    Patient is Jehovah's Witness    Sleep apnea     Past Surgical History:  Procedure Laterality Date   APPENDECTOMY     BIOPSY  11/11/2020   Procedure: BIOPSY;  Surgeon: Harvel Quale, MD;  [facility];  Service: Gastroenterology;;  duodenum gastric   BUNIONECTOMY     COLONOSCOPY WITH PROPOFOL N/A 11/11/2020   Procedure: COLONOSCOPY WITH PROPOFOL;  Surgeon: Harvel Quale, MD;  [facility];  Service: Gastroenterology;  Laterality: N/A;  11:15   ESOPHAGOGASTRODUODENOSCOPY (EGD) WITH PROPOFOL N/A 11/11/2020   Procedure: ESOPHAGOGASTRODUODENOSCOPY (EGD) WITH PROPOFOL;  Surgeon: Harvel Quale, MD;  [facility];  Service: Gastroenterology;  Laterality: N/A;  to arrive at [place]      There were no vitals filed for this visit.   Subjective Assessment - 05/26/21 1148     Subjective Pt states she was in the ER for a pulmonary embolism and was resting for a few days after that.  Pt feels  better in general but hasn't noticed progress since everything going on with the blood clot last week. I tried to do the exercises when I could.    Currently in Pain? No/denies                               [facility] Adult PT Treatment/Exercise - 05/26/21 0001       Lumbar Exercises: Stretches   Active Hamstring Stretch Right;Left;30 seconds    Hip Flexor Stretch Right;Left;3 reps;20 seconds    Other Lumbar Stretch Exercise rotation stretches      Lumbar Exercises: Aerobic   Nustep L1 x 6 min PT present for status update and cues to engage core      Lumbar Exercises: Standing   Functional Squats 20 reps    Shoulder Extension Strengthening;Both;20 reps;Theraband    Theraband Level (Shoulder Extension) Level 3 (Green)    Shoulder Extension Limitations press forwards red band with staggared stance      Lumbar Exercises: Supine   Bent Knee Raise 20 reps   green loop   Bridge with Ball Squeeze 10 reps    Large Ball Abdominal Isometric Limitations weight yellow ball overhead in hooklying - 20x                    PT Education - 05/26/21 1237  Education Details Access Code: [email or web address]    Person(s) Educated Patient    Methods Explanation;Demonstration;Tactile cues;Verbal cues;Handout    Comprehension Verbalized understanding;Returned demonstration                 PT Long Term Goals - 05/26/21 1339       PT LONG TERM GOAL #1   Title Pt will report at least 75% less volume of leakage    Baseline Not sure today because things have been stressful, but definitely a lot better    Time 8    Period Weeks    Status On-going    Target Date 07/21/21      PT LONG TERM GOAL #2   Title pt will be ind with advanced HEP    Baseline still learning    Time 8    Period Weeks    Status On-going    Target Date 07/21/21      PT LONG TERM GOAL #3   Title Pt will be able to void every 2-4 hours    Status Achieved      PT LONG TERM GOAL #4   Title Pt will  be able to cough and sneeze at least 50% of the time without leakage due to    Baseline a little less maybe 50% less    Status Achieved                   Plan - 05/26/21 1341     Clinical Impression Statement Pt is still making progress with overall less leakage and finalizing her HEP.  Pt has had a little slower progress due to health condition and things going on at home.  She has made good progress despite that and has met 2 long term goals.  Pt is expected to continue to see progress towards reducing the amount of leakage and can continue to progress her final HEP.    PT Treatment/Interventions Taping;Dry needling;Therapeutic activities;Therapeutic exercise;ADLs/Self Care Home Management;Biofeedback;Cryotherapy;Electrical Stimulation;Moist Heat;Manual techniques;Patient/family education    PT Next Visit Plan f/u on HEP added mini squat, nustep for warm up, monitor knee pain, walking with sports cord    PT Home Exercise Plan Access Code: [email or web address]    Consulted and Agree with Plan of Care Patient             Patient will benefit from skilled therapeutic intervention in order to improve the following deficits and impairments:  Increased muscle spasms, Increased fascial restricitons, Decreased strength, Decreased coordination, Decreased range of motion, Impaired flexibility, Postural dysfunction  Visit Diagnosis: Abnormal posture  Muscle weakness (generalized)     Problem List Patient Active Problem List   Diagnosis Date Noted   Neuralgia 05/10/2021   IBS (irritable bowel syndrome) 10/14/2020   History of colonic polyps 10/14/2020   Fatty liver 09/01/2020   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, 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   Posterior vitreous detachment of left eye 03/19/2020   Hypertensive retinopathy, grade 1, left 03/19/2020   Posterior vitreous detachment of both eyes 03/19/2020   Esophageal dysmotility  02/07/2018   Diabetes mellitus, type II ([place]) 10/30/2017   Hypertension 01/74/9449   Diastolic dysfunction 67/59/1638   Depression, recurrent ([place]) 10/30/2017   Obesity 10/30/2017   Hyperlipidemia 10/30/2017   Sleep apnea 10/30/2017   Multiple nevi 10/30/2017   Myasthenia gravis without acute exacerbation ([place]) 10/30/2017   Patient is Jehovah's Witness 10/30/2017    Jule Ser, PT  05/26/2021, 1:59 PM  [facility] [address], [place], [place], [address] Phone: [phone]   Fax:  [phone]  Name: Carolyn [name] MRN: [id number] Date of Birth: 06-23-1951

## 2021-05-27 ENCOUNTER — Encounter: Payer: Self-pay | Admitting: Internal Medicine

## 2021-05-27 ENCOUNTER — Ambulatory Visit (INDEPENDENT_AMBULATORY_CARE_PROVIDER_SITE_OTHER): Payer: Medicare Other | Admitting: Internal Medicine

## 2021-05-27 VITALS — BP 114/73 | HR 72 | Temp 98.3°F | Resp 18 | Ht 63.0 in | Wt 201.4 lb

## 2021-05-27 DIAGNOSIS — I10 Essential (primary) hypertension: Secondary | ICD-10-CM | POA: Diagnosis not present

## 2021-05-27 DIAGNOSIS — Z09 Encounter for follow-up examination after completed treatment for conditions other than malignant neoplasm: Secondary | ICD-10-CM | POA: Diagnosis not present

## 2021-05-27 DIAGNOSIS — E1169 Type 2 diabetes mellitus with other specified complication: Secondary | ICD-10-CM

## 2021-05-27 DIAGNOSIS — I2693 Single subsegmental pulmonary embolism without acute cor pulmonale: Secondary | ICD-10-CM | POA: Diagnosis not present

## 2021-05-27 DIAGNOSIS — R5382 Chronic fatigue, unspecified: Secondary | ICD-10-CM

## 2021-05-27 NOTE — Patient Instructions (Signed)
Please continue to take medications as prescribed.  Please get fasting blood tests done within a week.  If you notice any signs of bleeding or dark stools, please get medical attention immediately.

## 2021-05-27 NOTE — Assessment & Plan Note (Signed)
CT chest PE reviewed from ER visit On Xarelto Unclear etiology, but fatigue from viral illness might be provoked less mobility Continue Xarelto for 6 months

## 2021-05-27 NOTE — Progress Notes (Signed)
Established Patient Office Visit  Subjective:  Patient ID: Carolyn [name], female    DOB: [birth year]  Age: 70 y.o. MRN: [id number]  CC:  Chief Complaint  Patient presents with   Follow-up    ER follow up pt was in ER 05-18-21 they found a blood clot pt has been better since going to ER has been having a sharp pain in chest periodically on the left side has been happening on and off for a while     HPI Carolyn [name] is a 70 year old female with PMH of HTN, PE, OSA, IBS, DM, HLD, depression and obesity who presents for follow up after recent visit for PE.  She has been taking Xarelto for PE and has been feeling better now. Denies any chest tightness or dyspnea. Denies any visible bleeding. She c/o fatigue for few days before the ER visit, and was less active at that time.  BP is well-controlled. Takes medications regularly. Patient denies headache, dizziness, chest pain, dyspnea or palpitations.  She has been taking [place] regularly. Last HbA1C was 6.8. She denies any polyuria or polyphagia.  Past Medical History:  Diagnosis Date   Depression    Diabetes mellitus without complication ([place])    Diastolic dysfunction    Hyperlipidemia    Myasthenia gravis ([place])    Patient is Jehovah's Witness    Sleep apnea     Past Surgical History:  Procedure Laterality Date   APPENDECTOMY     BIOPSY  11/11/2020   Procedure: BIOPSY;  Surgeon: Harvel Quale, MD;  [facility];  Service: Gastroenterology;;  duodenum gastric   BUNIONECTOMY     COLONOSCOPY WITH PROPOFOL N/A 11/11/2020   Procedure: COLONOSCOPY WITH PROPOFOL;  Surgeon: Harvel Quale, MD;  [facility];  Service: Gastroenterology;  Laterality: N/A;  11:15   ESOPHAGOGASTRODUODENOSCOPY (EGD) WITH PROPOFOL N/A 11/11/2020   Procedure: ESOPHAGOGASTRODUODENOSCOPY (EGD) WITH PROPOFOL;  Surgeon: Harvel Quale, MD;  [facility];  Service: Gastroenterology;  Laterality:  N/A;  to arrive at [place], ADENOIDECTOMY, BILATERAL MYRINGOTOMY AND TUBES      Family History  Problem Relation Age of Onset   Arthritis Mother    Depression Mother    Diabetes Mother    Hypertension Mother    Alcohol abuse Father    Hypertension Father    Heart disease Father 48       Massive MI   Diabetes Brother    Diabetes Brother    Heart disease Brother    Diabetes Brother        Type I     Social History   Socioeconomic History   Marital status: Married    Spouse name: Carolyn [name]   Number of children: 4   Years of education: some college   Highest education level: Some college, no degree  Occupational History   Occupation: Retired  Tobacco Use   Smoking status: Never   Smokeless tobacco: Never  Vaping Use   Vaping Use: Never used  Substance and Sexual Activity   Alcohol use: Yes    Comment: socially   Drug use: No   Sexual activity: Yes    Birth control/protection: Other-see comments    Comment: postmenopausal  Other Topics Concern   Not on file  Social History Narrative   Not on file   Social Determinants of Health   Financial Resource Strain: Low Risk    Difficulty of Paying  Living Expenses: Not very hard  Food Insecurity: No Food Insecurity   Worried About [place] in the Last Year: Never true   Ran Out of Food in the Last Year: Never true  Transportation Needs: No Transportation Needs   Lack of Transportation (Medical): No   Lack of Transportation (Non-Medical): No  Physical Activity: Insufficiently Active   Days of Exercise per Week: 2 days   Minutes of Exercise per Session: 20 min  Stress: No Stress Concern Present   Feeling of Stress : Not at all  Social Connections: Moderately Integrated   Frequency of Communication with Friends and Family: More than three times a week   Frequency of Social Gatherings with Friends and Family: Three times a week   Attends Religious Services: More than 4 times  per year   Active Member of Clubs or Organizations: No   Attends Archivist Meetings: Never   Marital Status: Married  Human resources officer Violence: Not At Risk   Fear of Current or Ex-Partner: No   Emotionally Abused: No   Physically Abused: No   Sexually Abused: No    Outpatient Medications Prior to Visit  Medication Sig Dispense Refill   Blood Glucose Monitoring Suppl (BLOOD GLUCOSE SYSTEM PAK) KIT Use as directed to monitor FSBS 2x daily. Dx: E11.9 1 each 1   Cholecalciferol (VITAMIN D3) 125 MCG (5000 UT) TABS Take 5,000 Units by mouth daily.     dapagliflozin propanediol (FARXIGA) 10 MG TABS tablet Take 1 tablet (10 mg total) by mouth daily before breakfast. 90 tablet 2   dicyclomine (BENTYL) 10 MG capsule Take 1 capsule (10 mg total) by mouth every 12 (twelve) hours as needed for spasms. 60 capsule 1   Glycerin-Hypromellose-PEG 400 (DRY EYE RELIEF DROPS) 0.2-0.2-1 % SOLN Place 1 drop into both eyes daily as needed (Dry eye).     icosapent Ethyl (VASCEPA) 1 g capsule TAKE 2 CAPSULES BY MOUTH TWICE DAILY WITH A MEAL (Patient taking differently: Take 2 g by mouth 2 (two) times daily.) 120 capsule 3   ketoconazole (NIZORAL) 2 % cream Apply 1 application topically daily. (Patient taking differently: Apply 1 application topically daily as needed for irritation.) 15 g 0   losartan-hydrochlorothiazide (HYZAAR) 100-12.5 MG tablet Take 1 tablet by mouth daily. 30 tablet 3   metoprolol succinate (TOPROL-XL) 25 MG 24 hr tablet Take 1 tablet (25 mg total) by mouth daily. TAKE 1 TABLET BY MOUTH DAILY (Patient taking differently: Take 25 mg by mouth daily.) 90 tablet 1   Multiple Vitamins-Minerals (MULTI COMPLETE) CAPS Take 1 capsule by mouth daily.     ONETOUCH VERIO test strip USE TO TEST BLOOD SUGAR TWICE DAILY 100 strip 3   Pitavastatin Calcium (LIVALO) 2 MG TABS Take 1 tablet at bedtime for cholesterol 30 tablet 3   RIVAROXABAN (XARELTO) VTE STARTER PACK (15 & 20 MG) Follow package  directions: Take one $Remove'15mg'utxBngi$  tablet by mouth twice a day. On day 22, switch to one $Remo'20mg'MusMF$  tablet once a day. Take with food. 51 each 0   WELLBUTRIN XL 150 MG 24 hr tablet TAKE 1 TABLET BY MOUTH ONCE DAILY (Patient taking differently: Take 150 mg by mouth daily.) 90 tablet 3   fluconazole (DIFLUCAN) 150 MG tablet Take 1 tablet once by mouth. If persistent symptoms, repeat the dose after 3 days. (Patient not taking: No sig reported) 2 tablet 0   No facility-administered medications prior to visit.    Allergies  Allergen Reactions  Prozac [Fluoxetine Hcl] Anaphylaxis   Codeine Other (See Comments)    Intolerance    Other     -mycin medications: has myasthenia gravis    Sulfa Antibiotics Hives    ROS Review of Systems  Constitutional:  Negative for chills and fever.  HENT:  Negative for congestion, sinus pressure, sinus pain and sore throat.   Eyes:  Negative for pain and discharge.  Respiratory:  Negative for cough and shortness of breath.   Cardiovascular:  Negative for chest pain and palpitations.  Gastrointestinal:  Negative for abdominal pain, constipation, diarrhea, nausea and vomiting.  Endocrine: Negative for polydipsia and polyuria.  Genitourinary:  Negative for dysuria and hematuria.  Musculoskeletal:  Negative for neck pain and neck stiffness.  Skin:  Negative for rash.  Neurological:  Negative for dizziness and weakness.  Psychiatric/Behavioral:  Negative for agitation and behavioral problems.      Objective:    Physical Exam Vitals reviewed.  Constitutional:      General: She is not in acute distress.    Appearance: She is not diaphoretic.  HENT:     Head: Normocephalic and atraumatic.     Nose: Nose normal.     Mouth/Throat:     Mouth: Mucous membranes are moist.  Eyes:     General: No scleral icterus.    Extraocular Movements: Extraocular movements intact.  Cardiovascular:     Rate and Rhythm: Normal rate and regular rhythm.     Pulses: Normal pulses.      Heart sounds: Normal heart sounds. No murmur heard. Pulmonary:     Breath sounds: Normal breath sounds. No wheezing or rales.  Abdominal:     Palpations: Abdomen is soft.     Tenderness: There is no abdominal tenderness.  Musculoskeletal:     Cervical back: Neck supple. No tenderness.     Right lower leg: No edema.     Left lower leg: No edema.  Skin:    General: Skin is warm.     Findings: No rash.  Neurological:     General: No focal deficit present.     Mental Status: She is alert and oriented to person, place, and time.     Sensory: No sensory deficit.     Motor: No weakness.  Psychiatric:        Mood and Affect: Mood normal.        Behavior: Behavior normal.    BP 114/73 (BP Location: Left Arm, Patient Position: Sitting, Cuff Size: Normal)   Pulse 72   Temp 98.3 F (36.8 C) (Oral)   Resp 18   Ht $R'5\' 3"'BY$  (1.6 m)   Wt 201 lb 6.4 oz (91.4 kg)   SpO2 97%   BMI 35.68 kg/m  Wt Readings from Last 3 Encounters:  05/27/21 201 lb 6.4 oz (91.4 kg)  05/18/21 209 lb 7 oz (95 kg)  03/10/21 202 lb (91.6 kg)     Health Maintenance Due  Topic Date Due   Zoster Vaccines- Shingrix (1 of 2) Never done   COVID-19 Vaccine (4 - Booster for Moderna series) 03/14/2021    There are no preventive care reminders to display for this patient.  Lab Results  Component Value Date   TSH 2.36 01/22/2020   Lab Results  Component Value Date   WBC 6.6 05/18/2021   HGB 16.1 (H) 05/18/2021   HCT 46.6 (H) 05/18/2021   MCV 93.4 05/18/2021   PLT 204 05/18/2021   Lab Results  Component Value Date  NA 136 05/18/2021   K 4.4 05/18/2021   CO2 24 05/18/2021   GLUCOSE 141 (H) 05/18/2021   BUN 10 05/18/2021   CREATININE 0.78 05/18/2021   BILITOT 0.8 12/28/2020   AST 25 12/28/2020   ALT 26 12/28/2020   PROT 6.8 12/28/2020   CALCIUM 9.0 05/18/2021   ANIONGAP 11 05/18/2021   Lab Results  Component Value Date   CHOL 223 (H) 12/28/2020   Lab Results  Component Value Date   HDL 37 (L)  12/28/2020   Lab Results  Component Value Date   LDLCALC 130 (H) 12/28/2020   Lab Results  Component Value Date   TRIG 392 (H) 12/28/2020   Lab Results  Component Value Date   CHOLHDL 6.0 (H) 12/28/2020   Lab Results  Component Value Date   HGBA1C 6.8 (H) 12/28/2020      Assessment & Plan:   Problem List Items Addressed This Visit       Cardiovascular and Mediastinum   Hypertension    BP Readings from Last 1 Encounters:  05/27/21 114/73  Well-controlled with  Losartan-HCTZ 100-12.5 mg QD and Metoprolol 25 mg QD Counseled for compliance with the medications Advised DASH diet and moderate exercise/walking, at least 150 mins/week      Single subsegmental pulmonary embolism without acute cor pulmonale (HCC)    CT chest PE reviewed from ER visit On Xarelto Unclear etiology, but fatigue from viral illness might be provoked less mobility Continue Xarelto for 6 months       Relevant Orders   CBC   Basic Metabolic Panel (BMET)     Endocrine   Diabetes mellitus, type II ([place])    Lab Results  Component Value Date   HGBA1C 6.8 (H) 12/28/2020  On Farxiga, did not tolerate Metformin Advised to follow diabetic diet On ARB and statin      Relevant Orders   Lipid Profile   Hemoglobin A1c   Other Visit Diagnoses     Encounter for examination following treatment at hospital    -  Primary ER chart reviewed including imaging On Xarelto for PE   Chronic fatigue   Check TSH and free T4 along with CMP     Relevant Orders   TSH + free T4      No orders of the defined types were placed in this encounter.   Follow-up: Return in about 4 months (around 09/27/2021) for DM and HTN.    Lindell Spar, MD

## 2021-05-27 NOTE — Assessment & Plan Note (Signed)
BP Readings from Last 1 Encounters:  05/27/21 114/73   Well-controlled with  Losartan-HCTZ 100-12.5 mg QD and Metoprolol 25 mg QD Counseled for compliance with the medications Advised DASH diet and moderate exercise/walking, at least 150 mins/week

## 2021-05-27 NOTE — Assessment & Plan Note (Signed)
Lab Results  Component Value Date   HGBA1C 6.8 (H) 12/28/2020   On Farxiga, did not tolerate Metformin Advised to follow diabetic diet On ARB and statin

## 2021-05-28 DIAGNOSIS — E1169 Type 2 diabetes mellitus with other specified complication: Secondary | ICD-10-CM | POA: Diagnosis not present

## 2021-05-29 LAB — LIPID PANEL
Chol/HDL Ratio: 5.5 ratio — ABNORMAL HIGH (ref 0.0–4.4)
Cholesterol, Total: 186 mg/dL (ref 100–199)
HDL: 34 mg/dL — ABNORMAL LOW (ref >39)
LDL Chol Calc (NIH): 83 mg/dL (ref 0–99)
Triglycerides: 425 mg/dL — ABNORMAL HIGH (ref 0–149)
VLDL Cholesterol Cal: 69 mg/dL — ABNORMAL HIGH (ref 5–40)

## 2021-05-29 LAB — CBC
Hematocrit: 48.2 % — ABNORMAL HIGH (ref 34.0–46.6)
Hemoglobin: 15.8 g/dL (ref 11.1–15.9)
MCH: 31.3 pg (ref 26.6–33.0)
MCHC: 32.8 g/dL (ref 31.5–35.7)
MCV: 95 fL (ref 79–97)
Platelets: 232 10*3/uL (ref 150–450)
RBC: 5.05 x10E6/uL (ref 3.77–5.28)
RDW: 12.8 % (ref 11.7–15.4)
WBC: 6.5 10*3/uL (ref 3.4–10.8)

## 2021-05-29 LAB — BASIC METABOLIC PANEL
BUN/Creatinine Ratio: 13 (ref 12–28)
BUN: 11 mg/dL (ref 8–27)
CO2: 23 mmol/L (ref 20–29)
Calcium: 9.7 mg/dL (ref 8.7–10.3)
Chloride: 98 mmol/L (ref 96–106)
Creatinine, Ser: 0.83 mg/dL (ref 0.57–1.00)
Glucose: 155 mg/dL — ABNORMAL HIGH (ref 65–99)
Potassium: 4.4 mmol/L (ref 3.5–5.2)
Sodium: 140 mmol/L (ref 134–144)
eGFR: 76 mL/min/{1.73_m2} (ref >59)

## 2021-05-29 LAB — HEMOGLOBIN A1C
Est. average glucose Bld gHb Est-mCnc: 148 mg/dL
Hgb A1c MFr Bld: 6.8 % — ABNORMAL HIGH (ref 4.8–5.6)

## 2021-05-29 LAB — TSH+FREE T4
Free T4: 1.4 ng/dL (ref 0.82–1.77)
TSH: 3.14 u[IU]/mL (ref 0.450–4.500)

## 2021-05-30 DIAGNOSIS — G4733 Obstructive sleep apnea (adult) (pediatric): Secondary | ICD-10-CM | POA: Diagnosis not present

## 2021-05-31 ENCOUNTER — Other Ambulatory Visit: Payer: Self-pay | Admitting: *Deleted

## 2021-05-31 MED ORDER — ONETOUCH VERIO VI STRP: ORAL_STRIP | 3 refills | Status: DC

## 2021-05-31 MED ORDER — ICOSAPENT ETHYL 1 G PO CAPS: 1.0000 g | ORAL_CAPSULE | Freq: Every day | ORAL | 0 refills | Status: DC

## 2021-06-02 ENCOUNTER — Ambulatory Visit: Payer: Medicare Other | Admitting: Physical Therapy

## 2021-06-07 ENCOUNTER — Encounter: Payer: Medicare Other | Admitting: Physical Therapy

## 2021-06-07 ENCOUNTER — Other Ambulatory Visit: Payer: Self-pay | Admitting: Internal Medicine

## 2021-06-07 ENCOUNTER — Other Ambulatory Visit: Payer: Self-pay | Admitting: *Deleted

## 2021-06-07 ENCOUNTER — Telehealth: Payer: Self-pay

## 2021-06-07 DIAGNOSIS — I2693 Single subsegmental pulmonary embolism without acute cor pulmonale: Secondary | ICD-10-CM

## 2021-06-07 MED ORDER — RIVAROXABAN 20 MG PO TABS
20.0000 mg | ORAL_TABLET | Freq: Every day | ORAL | 4 refills | Status: DC
Start: 2021-06-07 — End: 2021-11-15

## 2021-06-07 NOTE — Telephone Encounter (Signed)
Patient called said Dr Posey Pronto asked her to call when she gets low on this medicine to get a refill sent in. She received it from the ER provider.  Xarelto 20 mg   Pharmacy: [facility] Dr Linna Hoff

## 2021-06-10 ENCOUNTER — Ambulatory Visit: Payer: Medicare Other | Admitting: Internal Medicine

## 2021-06-21 DIAGNOSIS — G4733 Obstructive sleep apnea (adult) (pediatric): Secondary | ICD-10-CM | POA: Diagnosis not present

## 2021-06-23 ENCOUNTER — Ambulatory Visit: Payer: Medicare Other | Admitting: Physical Therapy

## 2021-06-30 DIAGNOSIS — G4733 Obstructive sleep apnea (adult) (pediatric): Secondary | ICD-10-CM | POA: Diagnosis not present

## 2021-07-18 ENCOUNTER — Other Ambulatory Visit: Payer: Self-pay | Admitting: Internal Medicine

## 2021-07-18 DIAGNOSIS — I10 Essential (primary) hypertension: Secondary | ICD-10-CM

## 2021-07-28 ENCOUNTER — Ambulatory Visit: Payer: Medicare Other | Admitting: Internal Medicine

## 2021-07-30 DIAGNOSIS — G4733 Obstructive sleep apnea (adult) (pediatric): Secondary | ICD-10-CM | POA: Diagnosis not present

## 2021-08-05 ENCOUNTER — Encounter: Payer: Self-pay | Admitting: Internal Medicine

## 2021-08-05 ENCOUNTER — Other Ambulatory Visit: Payer: Self-pay

## 2021-08-05 ENCOUNTER — Ambulatory Visit (INDEPENDENT_AMBULATORY_CARE_PROVIDER_SITE_OTHER): Payer: Medicare Other | Admitting: Internal Medicine

## 2021-08-05 ENCOUNTER — Ambulatory Visit: Payer: Medicare Other

## 2021-08-05 VITALS — Temp 99.2°F

## 2021-08-05 DIAGNOSIS — Z20822 Contact with and (suspected) exposure to covid-19: Secondary | ICD-10-CM

## 2021-08-05 MED ORDER — BENZONATATE 100 MG PO CAPS: 100.0000 mg | ORAL_CAPSULE | Freq: Two times a day (BID) | ORAL | 0 refills | Status: DC | PRN

## 2021-08-05 NOTE — Progress Notes (Signed)
Virtual Visit via Telephone Note   This visit type was conducted due to national recommendations for restrictions regarding the COVID-19 Pandemic (e.g. social distancing) in an effort to limit this patient's exposure and mitigate transmission in our community.  Due to her co-morbid illnesses, this patient is at least at moderate risk for complications without adequate follow up.  This format is felt to be most appropriate for this patient at this time.  The patient did not have access to video technology/had technical difficulties with video requiring transitioning to audio format only (telephone).  All issues noted in this document were discussed and addressed.  No physical exam could be performed with this format.  Evaluation Performed:  Follow-up visit  Date:  08/05/2021   ID:  Carolyn [name], DOB 1950-12-26, MRN [id number]  Patient Location: Home Provider Location: Office/Clinic  Participants: Patient Location of Patient: Home Location of Provider: Telehealth Consent was obtain for visit to be over via telehealth. I verified that I am speaking with the correct person using two identifiers.  PCP:  Lindell Spar, MD   Chief Complaint: Cough and sore throat  History of Present Illness:    Carolyn [name] is a 70 y.o. female who has a televisit for complaint of cough, sore throat and ear fullness since yesterday.  She denies any fever, chills, dyspnea or wheezing.  She tested negative for COVID at home.  Of note her husband tested positive for COVID at home 2 days ago.  The patient does have symptoms concerning for COVID-19 infection (fever, chills, cough, or new shortness of breath).   Past Medical, Surgical, Social History, Allergies, and Medications have been Reviewed.  Past Medical History:  Diagnosis Date   Depression    Diabetes mellitus without complication ([place])    Diastolic dysfunction    Hyperlipidemia    Myasthenia gravis ([place])    Patient is Jehovah's Witness     Sleep apnea    Past Surgical History:  Procedure Laterality Date   APPENDECTOMY     BIOPSY  11/11/2020   Procedure: BIOPSY;  Surgeon: Harvel Quale, MD;  [facility];  Service: Gastroenterology;;  duodenum gastric   BUNIONECTOMY     COLONOSCOPY WITH PROPOFOL N/A 11/11/2020   Procedure: COLONOSCOPY WITH PROPOFOL;  Surgeon: Harvel Quale, MD;  [facility];  Service: Gastroenterology;  Laterality: N/A;  11:15   ESOPHAGOGASTRODUODENOSCOPY (EGD) WITH PROPOFOL N/A 11/11/2020   Procedure: ESOPHAGOGASTRODUODENOSCOPY (EGD) WITH PROPOFOL;  Surgeon: Harvel Quale, MD;  [facility];  Service: Gastroenterology;  Laterality: N/A;  to arrive at [place], ADENOIDECTOMY, BILATERAL MYRINGOTOMY AND TUBES       Current Meds  Medication Sig   benzonatate (TESSALON) 100 MG capsule Take 1 capsule (100 mg total) by mouth 2 (two) times daily as needed for cough.   Blood Glucose Monitoring Suppl (BLOOD GLUCOSE SYSTEM PAK) KIT Use as directed to monitor FSBS 2x daily. Dx: E11.9   Cholecalciferol (VITAMIN D3) 125 MCG (5000 UT) TABS Take 5,000 Units by mouth daily.   dapagliflozin propanediol (FARXIGA) 10 MG TABS tablet Take 1 tablet (10 mg total) by mouth daily before breakfast.   dicyclomine (BENTYL) 10 MG capsule Take 1 capsule (10 mg total) by mouth every 12 (twelve) hours as needed for spasms.   glucose blood (ONETOUCH VERIO) test strip USE TO TEST BLOOD SUGAR TWICE DAILY   Glycerin-Hypromellose-PEG 400 (DRY EYE RELIEF  DROPS) 0.2-0.2-1 % SOLN Place 1 drop into both eyes daily as needed (Dry eye).   icosapent Ethyl (VASCEPA) 1 g capsule Take 1 capsule (1 g total) by mouth daily.   ketoconazole (NIZORAL) 2 % cream Apply 1 application topically daily. (Patient taking differently: Apply 1 application topically daily as needed for irritation.)   losartan-hydrochlorothiazide (HYZAAR) 100-12.5 MG tablet TAKE 1 TABLET  BY MOUTH DAILY   metoprolol succinate (TOPROL-XL) 25 MG 24 hr tablet Take 1 tablet (25 mg total) by mouth daily. TAKE 1 TABLET BY MOUTH DAILY (Patient taking differently: Take 25 mg by mouth daily.)   Multiple Vitamins-Minerals (MULTI COMPLETE) CAPS Take 1 capsule by mouth daily.   Pitavastatin Calcium (LIVALO) 2 MG TABS Take 1 tablet at bedtime for cholesterol   rivaroxaban (XARELTO) 20 MG TABS tablet Take 1 tablet (20 mg total) by mouth daily with supper.   WELLBUTRIN XL 150 MG 24 hr tablet TAKE 1 TABLET BY MOUTH ONCE DAILY (Patient taking differently: Take 150 mg by mouth daily.)     Allergies:   Prozac [fluoxetine hcl], Codeine, Other, and Sulfa antibiotics   ROS:   Please see the history of present illness.     All other systems reviewed and are negative.   Labs/Other Tests and Data Reviewed:    Recent Labs: 12/28/2020: ALT 26 05/28/2021: BUN 11; Creatinine, Ser 0.83; Hemoglobin 15.8; Platelets 232; Potassium 4.4; Sodium 140; TSH 3.140   Recent Lipid Panel Lab Results  Component Value Date/Time   CHOL 186 05/28/2021 09:18 AM   TRIG 425 (H) 05/28/2021 09:18 AM   HDL 34 (L) 05/28/2021 09:18 AM   CHOLHDL 5.5 (H) 05/28/2021 09:18 AM   CHOLHDL 6.0 (H) 12/28/2020 08:27 AM   LDLCALC 83 05/28/2021 09:18 AM   LDLCALC 130 (H) 12/28/2020 08:27 AM    Wt Readings from Last 3 Encounters:  05/27/21 201 lb 6.4 oz (91.4 kg)  05/18/21 209 lb 7 oz (95 kg)  03/10/21 202 lb (91.6 kg)     ASSESSMENT & PLAN:    Suspected COVID-19 infection Check COVID RT-PCR Tessalon as needed for cough Tylenol as needed for fever or myalgias Self quarantine for now  Time:   Today, I have spent 9 minutes reviewing the chart, including problem list, medications, and with the patient with telehealth technology discussing the above problems.   Medication Adjustments/Labs and Tests Ordered: Current medicines are reviewed at length with the patient today.  Concerns regarding medicines are outlined above.    Tests Ordered: No orders of the defined types were placed in this encounter.   Medication Changes: Meds ordered this encounter  Medications   benzonatate (TESSALON) 100 MG capsule    Sig: Take 1 capsule (100 mg total) by mouth 2 (two) times daily as needed for cough.    Dispense:  30 capsule    Refill:  0     Note: This dictation was prepared with Dragon dictation along with smaller phrase technology. Similar sounding words can be transcribed inadequately or may not be corrected upon review. Any transcriptional errors that result from this process are unintentional.      Disposition:  Follow up  Signed, Lindell Spar, MD  08/05/2021 1:06 PM     [facility]

## 2021-08-07 LAB — NOVEL CORONAVIRUS, NAA: SARS-CoV-2, NAA: NOT DETECTED

## 2021-08-07 LAB — SARS-COV-2, NAA 2 DAY TAT

## 2021-08-18 ENCOUNTER — Telehealth: Payer: Self-pay

## 2021-08-18 ENCOUNTER — Telehealth: Payer: Self-pay | Admitting: Internal Medicine

## 2021-08-18 NOTE — Telephone Encounter (Signed)
LVM for Scholar to call the office

## 2021-08-18 NOTE — Telephone Encounter (Signed)
NP Scholar w/ [employer] called on pt behalf with concerns of depression. Wanted to speak with someone about the concerns to get pt help.  Cll back - [phone]

## 2021-08-18 NOTE — Telephone Encounter (Signed)
Do we have a name for the NP for call back?

## 2021-08-18 NOTE — Telephone Encounter (Signed)
Scholar returning call call back # [phone]

## 2021-08-18 NOTE — Telephone Encounter (Signed)
Error

## 2021-08-18 NOTE — Telephone Encounter (Signed)
Scholar is the name the NP spelled out for me when I asked the name

## 2021-08-18 NOTE — Telephone Encounter (Signed)
LVM for Scholar NP to call us back

## 2021-08-18 NOTE — Telephone Encounter (Signed)
Scholar is the name the NP spelled out for me when I asked the name.

## 2021-08-19 ENCOUNTER — Ambulatory Visit (INDEPENDENT_AMBULATORY_CARE_PROVIDER_SITE_OTHER): Payer: Medicare Other | Admitting: Internal Medicine

## 2021-08-19 ENCOUNTER — Other Ambulatory Visit: Payer: Self-pay

## 2021-08-19 ENCOUNTER — Encounter: Payer: Self-pay | Admitting: Internal Medicine

## 2021-08-19 DIAGNOSIS — F339 Major depressive disorder, recurrent, unspecified: Secondary | ICD-10-CM

## 2021-08-19 DIAGNOSIS — F411 Generalized anxiety disorder: Secondary | ICD-10-CM | POA: Diagnosis not present

## 2021-08-19 MED ORDER — SERTRALINE HCL 50 MG PO TABS: 50.0000 mg | ORAL_TABLET | Freq: Every day | ORAL | 3 refills | Status: DC

## 2021-08-19 MED ORDER — HYDROXYZINE PAMOATE 25 MG PO CAPS: 25.0000 mg | ORAL_CAPSULE | Freq: Every evening | ORAL | 0 refills | Status: DC | PRN

## 2021-08-19 NOTE — Telephone Encounter (Signed)
Spoke with Scholar pt let her know she has been very depressed and not happy she is taking wellbutrin but this is not working   Please schedule pt a virtual visit with patel as work in today if able

## 2021-08-19 NOTE — Assessment & Plan Note (Signed)
[place] Office Visit from 08/19/2021 in [facility]  PHQ-9 Total Score 14     Uncontrolled with Wellbutrin Added Zoloft 50 mg QD Did not have benefit with Lexapro in the past Referred to [facility] therapy

## 2021-08-19 NOTE — Patient Instructions (Signed)
Please start taking Zoloft for depression.  Please take Vistaril as needed for insomnia.  Please maintain simple sleep hygiene. - Maintain dark and non-noisy environment in the bedroom. - Please use the bedroom for sleep and sexual activity only. - Do not use electronic devices in the bedroom. - Please take dinner at least 2 hours before bedtime. - Please avoid caffeinated products in the evening, including coffee, soft drinks. - Please try to maintain the regular sleep-wake cycle - Go to bed and wake up at the same time.   You are being referred to Behavioral health therapist.

## 2021-08-19 NOTE — Telephone Encounter (Signed)
Appt scheduled

## 2021-08-19 NOTE — Progress Notes (Signed)
Virtual Visit via Telephone Note   This visit type was conducted due to national recommendations for restrictions regarding the COVID-19 Pandemic (e.g. social distancing) in an effort to limit this patient's exposure and mitigate transmission in our community.  Due to her co-morbid illnesses, this patient is at least at moderate risk for complications without adequate follow up.  This format is felt to be most appropriate for this patient at this time.  The patient did not have access to video technology/had technical difficulties with video requiring transitioning to audio format only (telephone).  All issues noted in this document were discussed and addressed.  No physical exam could be performed with this format.   Evaluation Performed:  Follow-up visit  Date:  08/19/2021   ID:  Carolyn [name], DOB [birth year], MRN [id number]  Patient Location: Home Provider Location: Office/Clinic  Participants: Patient Location of Patient: Home Location of Provider: Telehealth Consent was obtain for visit to be over via telehealth. I verified that I am speaking with the correct person using two identifiers.  PCP:  Lindell Spar, MD   Chief Complaint:    History of Present Illness:    Carolyn [name] is a 70 y.o. female who has a televisit for complaint feeling depressed and having crying spells at times.  She has decreased appetite and weight loss for last few months.  She denies any SI or HI.  She feels anxious at times, especially at nighttime and has difficulty maintaining sleep.  She is taking Wellbutrin currently.  She has tried Lexapro in the past with minimal to no help.  The patient does not have symptoms concerning for COVID-19 infection (fever, chills, cough, or new shortness of breath).   Past Medical, Surgical, Social History, Allergies, and Medications have been Reviewed.  Past Medical History:  Diagnosis Date   Depression    Diabetes mellitus without complication ([place])     Diastolic dysfunction    Hyperlipidemia    Myasthenia gravis ([place])    Patient is Jehovah's Witness    Sleep apnea    Past Surgical History:  Procedure Laterality Date   APPENDECTOMY     BIOPSY  11/11/2020   Procedure: BIOPSY;  Surgeon: Harvel Quale, MD;  [facility];  Service: Gastroenterology;;  duodenum gastric   BUNIONECTOMY     COLONOSCOPY WITH PROPOFOL N/A 11/11/2020   Procedure: COLONOSCOPY WITH PROPOFOL;  Surgeon: Harvel Quale, MD;  Location[facility];  Service: Gastroenterology;  Laterality: N/A;  11:15   ESOPHAGOGASTRODUODENOSCOPY (EGD) WITH PROPOFOL N/A 11/11/2020   Procedure: ESOPHAGOGASTRODUODENOSCOPY (EGD) WITH PROPOFOL;  Surgeon: Harvel Quale, MD;  Location: [facility];  Service: Gastroenterology;  Laterality: N/A;  to arrive at [place], ADENOIDECTOMY, BILATERAL MYRINGOTOMY AND TUBES       Current Meds  Medication Sig   benzonatate (TESSALON) 100 MG capsule Take 1 capsule (100 mg total) by mouth 2 (two) times daily as needed for cough.   Blood Glucose Monitoring Suppl (BLOOD GLUCOSE SYSTEM PAK) KIT Use as directed to monitor FSBS 2x daily. Dx: E11.9   Cholecalciferol (VITAMIN D3) 125 MCG (5000 UT) TABS Take 5,000 Units by mouth daily.   dapagliflozin propanediol (FARXIGA) 10 MG TABS tablet Take 1 tablet (10 mg total) by mouth daily before breakfast.   dicyclomine (BENTYL) 10 MG capsule Take 1 capsule (10 mg total) by mouth every 12 (twelve) hours as needed for spasms.  glucose blood (ONETOUCH VERIO) test strip USE TO TEST BLOOD SUGAR TWICE DAILY   Glycerin-Hypromellose-PEG 400 (DRY EYE RELIEF DROPS) 0.2-0.2-1 % SOLN Place 1 drop into both eyes daily as needed (Dry eye).   icosapent Ethyl (VASCEPA) 1 g capsule Take 1 capsule (1 g total) by mouth daily.   ketoconazole (NIZORAL) 2 % cream Apply 1 application topically daily. (Patient taking differently: Apply 1 application topically  daily as needed for irritation.)   losartan-hydrochlorothiazide (HYZAAR) 100-12.5 MG tablet TAKE 1 TABLET BY MOUTH DAILY   metoprolol succinate (TOPROL-XL) 25 MG 24 hr tablet Take 1 tablet (25 mg total) by mouth daily. TAKE 1 TABLET BY MOUTH DAILY (Patient taking differently: Take 25 mg by mouth daily.)   Multiple Vitamins-Minerals (MULTI COMPLETE) CAPS Take 1 capsule by mouth daily.   Pitavastatin Calcium (LIVALO) 2 MG TABS Take 1 tablet at bedtime for cholesterol   rivaroxaban (XARELTO) 20 MG TABS tablet Take 1 tablet (20 mg total) by mouth daily with supper.   WELLBUTRIN XL 150 MG 24 hr tablet TAKE 1 TABLET BY MOUTH ONCE DAILY (Patient taking differently: Take 150 mg by mouth daily.)     Allergies:   Prozac [fluoxetine hcl], Codeine, Other, and Sulfa antibiotics   ROS:   Please see the history of present illness.     All other systems reviewed and are negative.   Labs/Other Tests and Data Reviewed:    Recent Labs: 12/28/2020: ALT 26 05/28/2021: BUN 11; Creatinine, Ser 0.83; Hemoglobin 15.8; Platelets 232; Potassium 4.4; Sodium 140; TSH 3.140   Recent Lipid Panel Lab Results  Component Value Date/Time   CHOL 186 05/28/2021 09:18 AM   TRIG 425 (H) 05/28/2021 09:18 AM   HDL 34 (L) 05/28/2021 09:18 AM   CHOLHDL 5.5 (H) 05/28/2021 09:18 AM   CHOLHDL 6.0 (H) 12/28/2020 08:27 AM   LDLCALC 83 05/28/2021 09:18 AM   LDLCALC 130 (H) 12/28/2020 08:27 AM    Wt Readings from Last 3 Encounters:  05/27/21 201 lb 6.4 oz (91.4 kg)  05/18/21 209 lb 7 oz (95 kg)  03/10/21 202 lb (91.6 kg)     ASSESSMENT & PLAN:    Depression, recurrent ([place]) [place] Office Visit from 08/19/2021 in [facility]  PHQ-9 Total Score 14      Uncontrolled with Wellbutrin Added Zoloft 50 mg QD Did not have benefit with Lexapro in the past Referred to [facility] therapy  GAD (generalized anxiety disorder) With insomnia Started Zoloft 50 mg QD Vistaril PRN   Time:   Today, I have spent 15  minutes reviewing the chart, including problem list, medications, and with the patient with telehealth technology discussing the above problems.   Medication Adjustments/Labs and Tests Ordered: Current medicines are reviewed at length with the patient today.  Concerns regarding medicines are outlined above.   Tests Ordered: No orders of the defined types were placed in this encounter.   Medication Changes: No orders of the defined types were placed in this encounter.    Note: This dictation was prepared with Dragon dictation along with smaller phrase technology. Similar sounding words can be transcribed inadequately or may not be corrected upon review. Any transcriptional errors that result from this process are unintentional.      Disposition:  Follow up  Signed, Lindell Spar, MD  08/19/2021 1:42 PM     [facility]

## 2021-08-19 NOTE — Assessment & Plan Note (Signed)
With insomnia Started Zoloft 50 mg QD Vistaril PRN

## 2021-08-19 NOTE — Telephone Encounter (Signed)
Scholar returning call, call back # [phone]. patient very unhappy and depressed.

## 2021-08-20 ENCOUNTER — Telehealth: Payer: Self-pay | Admitting: *Deleted

## 2021-08-20 NOTE — Telephone Encounter (Signed)
Pt started generic for zoloft last night at 7pm she was awake most of the night and this has made her feel terribly afraid of everything please advise

## 2021-08-26 ENCOUNTER — Other Ambulatory Visit: Payer: Self-pay | Admitting: Internal Medicine

## 2021-08-30 DIAGNOSIS — G4733 Obstructive sleep apnea (adult) (pediatric): Secondary | ICD-10-CM | POA: Diagnosis not present

## 2021-08-30 DIAGNOSIS — Z961 Presence of intraocular lens: Secondary | ICD-10-CM | POA: Diagnosis not present

## 2021-08-31 ENCOUNTER — Other Ambulatory Visit: Payer: Self-pay

## 2021-08-31 ENCOUNTER — Ambulatory Visit (INDEPENDENT_AMBULATORY_CARE_PROVIDER_SITE_OTHER): Payer: Medicare Other | Admitting: Licensed Clinical Social Worker

## 2021-08-31 DIAGNOSIS — F411 Generalized anxiety disorder: Secondary | ICD-10-CM

## 2021-08-31 DIAGNOSIS — F339 Major depressive disorder, recurrent, unspecified: Secondary | ICD-10-CM

## 2021-09-01 ENCOUNTER — Telehealth (INDEPENDENT_AMBULATORY_CARE_PROVIDER_SITE_OTHER): Payer: Medicare Other | Admitting: Licensed Clinical Social Worker

## 2021-09-01 DIAGNOSIS — F339 Major depressive disorder, recurrent, unspecified: Secondary | ICD-10-CM

## 2021-09-01 NOTE — BH Specialist Note (Signed)
[place] Initial TeleMedicine Clinical Assessment  MRN: [id number] NAME: [name] Date: 09/01/21  Start time: 58 End time: 950 Total time: 20  Types of Service: Telephone visit Referring Provider: Dr. Posey Pronto Reason for Visit today: begin [facility]  Patient/Family location: home [place] Provider location: home office All persons participating in visit: yes  I connected with patient and/or family via Telephone or Video Enabled Telemedicine Application  (Video is Caregility application) and verified that I am speaking with the correct person using two identifiers.   Discussed confidentiality: Yes   I discussed the limitations of telemedicine and the availability of in person appointments.  Discussed there is a possibility of technology failure and discussed alternative modes of communication if that failure occurs.  I discussed that engaging in this telemedicine visit, they consent to the provision of behavioral healthcare and the services will be billed under their insurance.  Patient and/or legal guardian expressed understanding and consented to Telemedicine visit: Yes   Treatment History Patient recently received Inpatient Treatment: No  Patient currently being seen by therapist/psychiatrist: No  Patient currently receiving the following services: no  Past Psychiatric History/Diagnosis/Hospitalization(s): Anxiety: No Bipolar Disorder: No Depression: Yes Mania: No Psychosis: No Schizophrenia: No Personality Disorder: No Hospitalization for psychiatric illness: No History of Electroconvulsive Shock Therapy: No Prior Suicide Attempts: No  Decreased need for sleep: No  Euphoria: No  Self Injurious behaviors: No  Family History of mental illness: No  Family History of substance abuse: No  Substance Abuse: No  DUI: No  Insomnia: No   History of violence: No  Physical, sexual or emotional abuse: No  Prior outpatient mental health therapy: No    Clinical Assessment  PHQ9 SCORE ONLY 09/01/2021 08/19/2021 08/05/2021  PHQ-9 Total Score 17 14 0    GAD 7 : Generalized Anxiety Score 09/01/2021 08/19/2021  Nervous, Anxious, on Edge 1 3  Control/stop worrying 2 3  Worry too much - different things 2 3  Trouble relaxing 1 3  Restless 0 0  Easily annoyed or irritable 0 0  Afraid - awful might happen 0 1  Total GAD 7 Score 6 13  Anxiety Difficulty Not difficult at all Somewhat difficult      Social Functioning Social maturity: WNL Social judgement: WNL  Stress Current stressors: unknown Familial stressors: none Sleep: fair Appetite: fair Coping ability: overwhelmed Patient taking medications as prescribed:  yes  Current medications:  Outpatient Encounter Medications as of 08/31/2021  Medication Sig   benzonatate (TESSALON) 100 MG capsule Take 1 capsule (100 mg total) by mouth 2 (two) times daily as needed for cough.   Blood Glucose Monitoring Suppl (BLOOD GLUCOSE SYSTEM PAK) KIT Use as directed to monitor FSBS 2x daily. Dx: E11.9   Cholecalciferol (VITAMIN D3) 125 MCG (5000 UT) TABS Take 5,000 Units by mouth daily.   dapagliflozin propanediol (FARXIGA) 10 MG TABS tablet Take 1 tablet (10 mg total) by mouth daily before breakfast.   dicyclomine (BENTYL) 10 MG capsule Take 1 capsule (10 mg total) by mouth every 12 (twelve) hours as needed for spasms.   glucose blood (ONETOUCH VERIO) test strip USE TO TEST BLOOD SUGAR TWICE DAILY   Glycerin-Hypromellose-PEG 400 (DRY EYE RELIEF DROPS) 0.2-0.2-1 % SOLN Place 1 drop into both eyes daily as needed (Dry eye).   hydrOXYzine (VISTARIL) 25 MG capsule Take 1 capsule (25 mg total) by mouth at bedtime as needed for anxiety (Insomnia).   icosapent Ethyl (VASCEPA) 1 g capsule TAKE 1 CAPSULE(1  GRAM) BY MOUTH DAILY   ketoconazole (NIZORAL) 2 % cream Apply 1 application topically daily. (Patient taking differently: Apply 1 application topically daily as needed for irritation.)    losartan-hydrochlorothiazide (HYZAAR) 100-12.5 MG tablet TAKE 1 TABLET BY MOUTH DAILY   metoprolol succinate (TOPROL-XL) 25 MG 24 hr tablet Take 1 tablet (25 mg total) by mouth daily. TAKE 1 TABLET BY MOUTH DAILY (Patient taking differently: Take 25 mg by mouth daily.)   Multiple Vitamins-Minerals (MULTI COMPLETE) CAPS Take 1 capsule by mouth daily.   Pitavastatin Calcium (LIVALO) 2 MG TABS Take 1 tablet at bedtime for cholesterol   rivaroxaban (XARELTO) 20 MG TABS tablet Take 1 tablet (20 mg total) by mouth daily with supper.   sertraline (ZOLOFT) 50 MG tablet Take 1 tablet (50 mg total) by mouth daily.   WELLBUTRIN XL 150 MG 24 hr tablet TAKE 1 TABLET BY MOUTH ONCE DAILY (Patient taking differently: Take 150 mg by mouth daily.)   No facility-administered encounter medications on file as of 08/31/2021.     Self-harm and/or Suicidal Behaviors Risk Assessment Self-harm risk factors: no Patient endorses recent self injurious thoughts and/or behaviors: No  Suicide ideations: No plan to harm self or others   Danger to Others Risk Assessment Danger to others risk factors: no Patient endorses recent thoughts of harming others: No    Substance Use Assessment Patient recently consumed alcohol: No  Patient recently used drugs: No  Patient is concerned about dependence or abuse of substances: No    Goals, Interventions and Follow-up Plan Goals: Increase healthy adjustment to current life circumstances Interventions: Mindfulness or Relaxation Training and Behavioral Activation Follow-up Plan:  VBH sessions weekly  Summary of Clinical Assessment Summary: Carolyn [name] is a 70 yr old woman who presents for initial assessment for depression.  Carolyn [name] reports that she has experienced sadness, crying spells for the past month and is unable to "shake it." She reports having a "Covid scare" about a month ago and her mood has been down.  She states that she did not have COVID but was experiencing the flu.   She has a history of depression for over 40 yrs but this "time things are different."  She is open to behavioral activitation. She is currently taking Wellbutrin with unknown benefit.   Carolyn South, LCSW

## 2021-09-01 NOTE — BH Specialist Note (Signed)
Virtual Behavioral Health Treatment Plan Team Note  MRN: [id number] NAME: Carolyn [name]  DATE: 09/03/21  Start time:   244p End time:  249 Total time:  5 min  Total number of Virtual [place] Treatment Team Plan encounters: 1/4  Treatment Team Attendees: Royal Piedra, LCSW & Dr. Modesta Messing  Diagnoses:    ICD-10-CM   1. Depression, recurrent ([place])  F33.9       Goals, Interventions and Follow-up Plan Goals: Increase healthy adjustment to current life circumstances Interventions: Mindfulness or Relaxation Training Behavioral Activation Medication Management Recommendations: no change Follow-up Plan: VBH sessions weekly  History of the present illness Presenting Problem/Current Symptoms: moving to senior living home  Psychiatric History  Depression: No Anxiety: No Mania: No Psychosis: No PTSD symptoms: No  Past Psychiatric History/Hospitalization(s): Hospitalization for psychiatric illness: No Prior Suicide Attempts: No Prior Self-injurious behavior: No  Psychosocial stressors moving to Senior living home  Self-harm Behaviors Risk Assessment   Screenings PHQ-9 Assessments:  Depression screen [facility] 2/9 09/01/2021 08/19/2021 08/05/2021  Decreased Interest 3 3 0  Down, Depressed, Hopeless 2 3 0  PHQ - 2 Score 5 6 0  Altered sleeping 2 0 -  Tired, decreased energy 2 3 -  Change in appetite 2 3 -  Feeling bad or failure about yourself  2 1 -  Trouble concentrating 2 1 -  Moving slowly or fidgety/restless 2 0 -  Suicidal thoughts 0 0 -  PHQ-9 Score 17 14 -  Difficult doing work/chores Very difficult Somewhat difficult -  Some recent data might be hidden   GAD-7 Assessments:  GAD 7 : Generalized Anxiety Score 09/01/2021 08/19/2021  Nervous, Anxious, on Edge 1 3  Control/stop worrying 2 3  Worry too much - different things 2 3  Trouble relaxing 1 3  Restless 0 0  Easily annoyed or irritable 0 0  Afraid - awful might happen 0 1  Total GAD 7 Score 6 13  Anxiety  Difficulty Not difficult at all Somewhat difficult    Past Medical History Past Medical History:  Diagnosis Date   Depression    Diabetes mellitus without complication ([facility])    Diastolic dysfunction    Hyperlipidemia    Myasthenia gravis ([place])    Patient is Jehovah's Witness    Sleep apnea     Vital signs: There were no vitals filed for this visit.  Allergies:  Allergies as of 09/01/2021 - Review Complete 08/19/2021  Allergen Reaction Noted   Prozac [fluoxetine hcl] Anaphylaxis 12/16/2015   Codeine Other (See Comments) 12/16/2015   Other  10/30/2017   Sulfa antibiotics Hives 12/16/2015    Medication History Current medications:  Outpatient Encounter Medications as of 09/01/2021  Medication Sig   benzonatate (TESSALON) 100 MG capsule Take 1 capsule (100 mg total) by mouth 2 (two) times daily as needed for cough.   Blood Glucose Monitoring Suppl (BLOOD GLUCOSE SYSTEM PAK) KIT Use as directed to monitor FSBS 2x daily. Dx: E11.9   Cholecalciferol (VITAMIN D3) 125 MCG (5000 UT) TABS Take 5,000 Units by mouth daily.   dapagliflozin propanediol (FARXIGA) 10 MG TABS tablet Take 1 tablet (10 mg total) by mouth daily before breakfast.   dicyclomine (BENTYL) 10 MG capsule Take 1 capsule (10 mg total) by mouth every 12 (twelve) hours as needed for spasms.   glucose blood (ONETOUCH VERIO) test strip USE TO TEST BLOOD SUGAR TWICE DAILY   Glycerin-Hypromellose-PEG 400 (DRY EYE RELIEF DROPS) 0.2-0.2-1 % SOLN Place 1 drop into both eyes  daily as needed (Dry eye).   hydrOXYzine (VISTARIL) 25 MG capsule Take 1 capsule (25 mg total) by mouth at bedtime as needed for anxiety (Insomnia).   icosapent Ethyl (VASCEPA) 1 g capsule TAKE 1 CAPSULE(1 GRAM) BY MOUTH DAILY   ketoconazole (NIZORAL) 2 % cream Apply 1 application topically daily. (Patient taking differently: Apply 1 application topically daily as needed for irritation.)   losartan-hydrochlorothiazide (HYZAAR) 100-12.5 MG tablet TAKE 1 TABLET BY  MOUTH DAILY   metoprolol succinate (TOPROL-XL) 25 MG 24 hr tablet Take 1 tablet (25 mg total) by mouth daily. TAKE 1 TABLET BY MOUTH DAILY (Patient taking differently: Take 25 mg by mouth daily.)   Multiple Vitamins-Minerals (MULTI COMPLETE) CAPS Take 1 capsule by mouth daily.   Pitavastatin Calcium (LIVALO) 2 MG TABS Take 1 tablet at bedtime for cholesterol   rivaroxaban (XARELTO) 20 MG TABS tablet Take 1 tablet (20 mg total) by mouth daily with supper.   sertraline (ZOLOFT) 50 MG tablet Take 1 tablet (50 mg total) by mouth daily.   WELLBUTRIN XL 150 MG 24 hr tablet TAKE 1 TABLET BY MOUTH ONCE DAILY (Patient taking differently: Take 150 mg by mouth daily.)   No facility-administered encounter medications on file as of 09/01/2021.     Scribe for Treatment Team: Lubertha South, LCSW

## 2021-09-01 NOTE — Progress Notes (Signed)
Virtual behavioral Health Initiative ([place]) Psychiatric Consultant Case Review   Carolyn [name] is a 70 y.o. year old female with a history of depression, recurrent with seasonal pattern, HTN, PE, OSA, IBS, DM, HLD.  Worsening in depressive symptoms in the context of having physical symptoms which was initially concerned of COVID.  She had negative result on repeated past.  She has been isolating, although she is looking forward to the upcoming birth of her grand child.  She reports good relationship with her daughter.  Sertraline was started in October.   Assessment/Provisional Diagnosis # MDD, recurrent No change in her medication given sertraline was added last month.   Recommendation  -Continue sertraline 50 mg daily -Continue bupropion 150 mg daily -Continue hydroxyzine 25 mg at night as needed for anxiety -BS specialist to work on mindfulness exercise, behavioral activation   Thank you for your consult. We will continue to follow the patient. Please contact [place]  for any questions or concerns.   The above treatment considerations and suggestions are based on consultation with the [facility] specialist and/or PCP and a review of information available in the shared registry and the patient's [place] Record (EHR). I have not personally examined the patient. All recommendations should be implemented with consideration of the patient's relevant prior history and current clinical status. Please feel free to call me with any questions about the care of this patient.

## 2021-09-09 ENCOUNTER — Ambulatory Visit (INDEPENDENT_AMBULATORY_CARE_PROVIDER_SITE_OTHER): Payer: Medicare Other | Admitting: Clinical

## 2021-09-09 ENCOUNTER — Other Ambulatory Visit: Payer: Self-pay

## 2021-09-09 DIAGNOSIS — F339 Major depressive disorder, recurrent, unspecified: Secondary | ICD-10-CM

## 2021-09-15 ENCOUNTER — Ambulatory Visit (INDEPENDENT_AMBULATORY_CARE_PROVIDER_SITE_OTHER): Payer: Medicare Other | Admitting: *Deleted

## 2021-09-15 ENCOUNTER — Other Ambulatory Visit: Payer: Self-pay

## 2021-09-15 DIAGNOSIS — Z23 Encounter for immunization: Secondary | ICD-10-CM

## 2021-09-21 ENCOUNTER — Other Ambulatory Visit: Payer: Self-pay | Admitting: Internal Medicine

## 2021-09-21 DIAGNOSIS — I10 Essential (primary) hypertension: Secondary | ICD-10-CM

## 2021-09-22 DIAGNOSIS — Z961 Presence of intraocular lens: Secondary | ICD-10-CM | POA: Diagnosis not present

## 2021-09-27 ENCOUNTER — Ambulatory Visit (INDEPENDENT_AMBULATORY_CARE_PROVIDER_SITE_OTHER): Payer: Medicare Other | Admitting: Internal Medicine

## 2021-09-27 ENCOUNTER — Other Ambulatory Visit: Payer: Self-pay

## 2021-09-27 ENCOUNTER — Encounter: Payer: Self-pay | Admitting: Internal Medicine

## 2021-09-27 VITALS — BP 133/81 | HR 95 | Ht 63.0 in | Wt 191.1 lb

## 2021-09-27 DIAGNOSIS — Z7901 Long term (current) use of anticoagulants: Secondary | ICD-10-CM | POA: Diagnosis not present

## 2021-09-27 DIAGNOSIS — I2693 Single subsegmental pulmonary embolism without acute cor pulmonale: Secondary | ICD-10-CM

## 2021-09-27 DIAGNOSIS — E782 Mixed hyperlipidemia: Secondary | ICD-10-CM | POA: Diagnosis not present

## 2021-09-27 DIAGNOSIS — E1169 Type 2 diabetes mellitus with other specified complication: Secondary | ICD-10-CM

## 2021-09-27 DIAGNOSIS — I10 Essential (primary) hypertension: Secondary | ICD-10-CM

## 2021-09-27 DIAGNOSIS — Z23 Encounter for immunization: Secondary | ICD-10-CM

## 2021-09-27 DIAGNOSIS — F339 Major depressive disorder, recurrent, unspecified: Secondary | ICD-10-CM | POA: Diagnosis not present

## 2021-09-27 LAB — POCT GLYCOSYLATED HEMOGLOBIN (HGB A1C): Hemoglobin A1C: 6.8 % — AB (ref 4.0–5.6)

## 2021-09-27 MED ORDER — LIVALO 2 MG PO TABS: ORAL_TABLET | ORAL | 1 refills | Status: DC

## 2021-09-27 NOTE — Assessment & Plan Note (Signed)
[place] Office Visit from 09/27/2021 in [facility]  PHQ-9 Total Score 4     Uncontrolled with Wellbutrin Had severe anxiety and insomnia with Zoloft 50 mg QD, discontinued Did not have benefit with Lexapro in the past Referred to [facility] therapy in [place]

## 2021-09-27 NOTE — Addendum Note (Signed)
Addended by: Quentin Angst on: 09/27/2021 02:25 PM   Modules accepted: Orders

## 2021-09-27 NOTE — Assessment & Plan Note (Signed)
CT chest PE reviewed from ER visit On Xarelto Unclear etiology, but fatigue from viral illness might have provoked less mobility Continue Xarelto for 6 months - additional 1 month from now

## 2021-09-27 NOTE — Patient Instructions (Addendum)
Please continue taking medications as prescribed.  Please continue to follow low carb diet and ambulate as tolerated.  You are being referred to Behavioral health clinic.

## 2021-09-27 NOTE — Assessment & Plan Note (Signed)
BP Readings from Last 1 Encounters:  09/27/21 133/81   Well-controlled with  Losartan-HCTZ 100-12.5 mg QD and Metoprolol 25 mg QD Counseled for compliance with the medications Advised DASH diet and moderate exercise/walking, at least 150 mins/week

## 2021-09-27 NOTE — Addendum Note (Signed)
Addended by: Quentin Angst on: 09/27/2021 02:29 PM   Modules accepted: Orders

## 2021-09-27 NOTE — Assessment & Plan Note (Addendum)
Lab Results  Component Value Date   HGBA1C 6.8 (H) 05/28/2021   On Farxiga, did not tolerate Metformin Advised to follow diabetic diet On ARB and statin

## 2021-09-27 NOTE — Assessment & Plan Note (Signed)
Lipid profile reviewed, needs to take statin regularly On Vascepa

## 2021-09-27 NOTE — Progress Notes (Signed)
Established Patient Office Visit  Subjective:  Patient ID: Carolyn [name], female    DOB: 1951/05/11  Age: 70 y.o. MRN: [id number]  CC:  Chief Complaint  Patient presents with   Follow-up    4 month follow up     HPI Carolyn [name] is a 70 y.o. female with past medical history of HTN, PE, OSA, IBS, DM, HLD, depression and obesity who presents for f/u of her chronic medical conditions.  She has been taking Xarelto for PE and has been feeling better now. Denies any chest tightness or dyspnea. Denies any visible bleeding. She is advised to stop Xarelto after 1 more month.  BP is well-controlled. Takes medications regularly. Patient denies headache, dizziness, chest pain, dyspnea or palpitations.  She has been taking [place] regularly. Last HbA1C was 6.8. She denies any polyuria or polyphagia. She states that her blood glucose runs around 150 in the morning, although her HbA1C is very well-controlled. She denies any hypoglycemia episode. She has not been taking statin for HLD, but still takes Vascepa.  She tried Zoloft for depression, but had severe anxiety and insomnia with it. She still takes Wellbutrin 150 mg QD. She has tried Wellbutrin 300 mg, but did not tolerate that dose. She denies any anhedonia, SI or HI. She prefers to see a different therapist.  She received PCV20 in the office today.  Past Medical History:  Diagnosis Date   Depression    Diabetes mellitus without complication ([place])    Diastolic dysfunction    Hyperlipidemia    Myasthenia gravis ([place])    Patient is Jehovah's Witness    Sleep apnea     Past Surgical History:  Procedure Laterality Date   APPENDECTOMY     BIOPSY  11/11/2020   Procedure: BIOPSY;  Surgeon: Harvel Quale, MD;  [facility];  Service: Gastroenterology;;  duodenum gastric   BUNIONECTOMY     COLONOSCOPY WITH PROPOFOL N/A 11/11/2020   Procedure: COLONOSCOPY WITH PROPOFOL;  Surgeon: Harvel Quale, MD;   [facility];  Service: Gastroenterology;  Laterality: N/A;  11:15   ESOPHAGOGASTRODUODENOSCOPY (EGD) WITH PROPOFOL N/A 11/11/2020   Procedure: ESOPHAGOGASTRODUODENOSCOPY (EGD) WITH PROPOFOL;  Surgeon: Harvel Quale, MD;  [facility];  Service: Gastroenterology;  Laterality: N/A;  to arrive at [place], ADENOIDECTOMY, BILATERAL MYRINGOTOMY AND TUBES      Family History  Problem Relation Age of Onset   Arthritis Mother    Depression Mother    Diabetes Mother    Hypertension Mother    Alcohol abuse Father    Hypertension Father    Heart disease Father 7       Massive MI   Diabetes Brother    Diabetes Brother    Heart disease Brother    Diabetes Brother        Type I     Social History   Socioeconomic History   Marital status: Married    Spouse name: Carolyn [name]   Number of children: 4   Years of education: some college   Highest education level: Some college, no degree  Occupational History   Occupation: Retired  Tobacco Use   Smoking status: Never   Smokeless tobacco: Never  Vaping Use   Vaping Use: Never used  Substance and Sexual Activity   Alcohol use: Yes    Comment: socially   Drug use: No   Sexual activity: Yes  Birth control/protection: Other-see comments    Comment: postmenopausal  Other Topics Concern   Not on file  Social History Narrative   Not on file   Social Determinants of Health   Financial Resource Strain: Not on file  Food Insecurity: No Food Insecurity   Worried About Charity fundraiser in the Last Year: Never true   Ran Out of Food in the Last Year: Never true  Transportation Needs: No Transportation Needs   Lack of Transportation (Medical): No   Lack of Transportation (Non-Medical): No  Physical Activity: Insufficiently Active   Days of Exercise per Week: 2 days   Minutes of Exercise per Session: 20 min  Stress: No Stress Concern Present   Feeling of Stress : Not at  all  Social Connections: Moderately Integrated   Frequency of Communication with Friends and Family: More than three times a week   Frequency of Social Gatherings with Friends and Family: Three times a week   Attends Religious Services: More than 4 times per year   Active Member of Clubs or Organizations: No   Attends Archivist Meetings: Never   Marital Status: Married  Human resources officer Violence: Not At Risk   Fear of Current or Ex-Partner: No   Emotionally Abused: No   Physically Abused: No   Sexually Abused: No    Outpatient Medications Prior to Visit  Medication Sig Dispense Refill   benzonatate (TESSALON) 100 MG capsule Take 1 capsule (100 mg total) by mouth 2 (two) times daily as needed for cough. 30 capsule 0   Blood Glucose Monitoring Suppl (BLOOD GLUCOSE SYSTEM PAK) KIT Use as directed to monitor FSBS 2x daily. Dx: E11.9 1 each 1   Cholecalciferol (VITAMIN D3) 125 MCG (5000 UT) TABS Take 5,000 Units by mouth daily.     dapagliflozin propanediol (FARXIGA) 10 MG TABS tablet Take 1 tablet (10 mg total) by mouth daily before breakfast. 90 tablet 2   dicyclomine (BENTYL) 10 MG capsule Take 1 capsule (10 mg total) by mouth every 12 (twelve) hours as needed for spasms. 60 capsule 1   glucose blood (ONETOUCH VERIO) test strip USE TO TEST BLOOD SUGAR TWICE DAILY 100 strip 3   Glycerin-Hypromellose-PEG 400 (DRY EYE RELIEF DROPS) 0.2-0.2-1 % SOLN Place 1 drop into both eyes daily as needed (Dry eye).     hydrOXYzine (VISTARIL) 25 MG capsule Take 1 capsule (25 mg total) by mouth at bedtime as needed for anxiety (Insomnia). 30 capsule 0   icosapent Ethyl (VASCEPA) 1 g capsule TAKE 1 CAPSULE(1 GRAM) BY MOUTH DAILY 90 capsule 0   ketoconazole (NIZORAL) 2 % cream Apply 1 application topically daily. (Patient taking differently: Apply 1 application topically daily as needed for irritation.) 15 g 0   losartan-hydrochlorothiazide (HYZAAR) 100-12.5 MG tablet TAKE 1 TABLET BY MOUTH DAILY 30  tablet 3   metoprolol succinate (TOPROL-XL) 25 MG 24 hr tablet TAKE 1 TABLET(25 MG) BY MOUTH DAILY 90 tablet 1   Multiple Vitamins-Minerals (MULTI COMPLETE) CAPS Take 1 capsule by mouth daily.     rivaroxaban (XARELTO) 20 MG TABS tablet Take 1 tablet (20 mg total) by mouth daily with supper. 30 tablet 4   WELLBUTRIN XL 150 MG 24 hr tablet TAKE 1 TABLET BY MOUTH ONCE DAILY (Patient taking differently: Take 150 mg by mouth daily.) 90 tablet 3   Pitavastatin Calcium (LIVALO) 2 MG TABS Take 1 tablet at bedtime for cholesterol 30 tablet 3   sertraline (ZOLOFT) 50 MG tablet Take 1  tablet (50 mg total) by mouth daily. 30 tablet 3   No facility-administered medications prior to visit.    Allergies  Allergen Reactions   Prozac [Fluoxetine Hcl] Anaphylaxis   Codeine Other (See Comments)    Intolerance    Other     -mycin medications: has myasthenia gravis    Sulfa Antibiotics Hives    ROS Review of Systems  Constitutional:  Negative for chills and fever.  HENT:  Negative for congestion, sinus pressure, sinus pain and sore throat.   Eyes:  Negative for pain and discharge.  Respiratory:  Negative for cough and shortness of breath.   Cardiovascular:  Negative for chest pain and palpitations.  Gastrointestinal:  Negative for abdominal pain, constipation, diarrhea, nausea and vomiting.  Endocrine: Negative for polydipsia and polyuria.  Genitourinary:  Negative for dysuria and hematuria.  Musculoskeletal:  Negative for neck pain and neck stiffness.  Skin:  Negative for rash.  Neurological:  Negative for dizziness and weakness.  Psychiatric/Behavioral:  Positive for dysphoric mood and sleep disturbance. Negative for agitation and behavioral problems. The patient is nervous/anxious.      Objective:    Physical Exam Vitals reviewed.  Constitutional:      General: She is not in acute distress.    Appearance: She is not diaphoretic.  HENT:     Head: Normocephalic and atraumatic.     Nose:  Nose normal.     Mouth/Throat:     Mouth: Mucous membranes are moist.  Eyes:     General: No scleral icterus.    Extraocular Movements: Extraocular movements intact.  Cardiovascular:     Rate and Rhythm: Normal rate and regular rhythm.     Pulses: Normal pulses.     Heart sounds: Normal heart sounds. No murmur heard. Pulmonary:     Breath sounds: Normal breath sounds. No wheezing or rales.  Musculoskeletal:     Cervical back: Neck supple. No tenderness.     Right lower leg: No edema.     Left lower leg: No edema.  Skin:    General: Skin is warm.     Findings: No rash.  Neurological:     General: No focal deficit present.     Mental Status: She is alert and oriented to person, place, and time.     Sensory: No sensory deficit.     Motor: No weakness.  Psychiatric:        Mood and Affect: Mood normal.        Behavior: Behavior normal.    BP 133/81   Pulse 95   Ht _0  (1.6 m)   Wt 191 lb 1.9 oz (86.7 kg)   SpO2 (!) 79%   BMI 33.86 kg/m  Wt Readings from Last 3 Encounters:  09/27/21 191 lb 1.9 oz (86.7 kg)  05/27/21 201 lb 6.4 oz (91.4 kg)  05/18/21 209 lb 7 oz (95 kg)    Lab Results  Component Value Date   TSH 3.140 05/28/2021   Lab Results  Component Value Date   WBC 6.5 05/28/2021   HGB 15.8 05/28/2021   HCT 48.2 (H) 05/28/2021   MCV 95 05/28/2021   PLT 232 05/28/2021   Lab Results  Component Value Date   NA 140 05/28/2021   K 4.4 05/28/2021   CO2 23 05/28/2021   GLUCOSE 155 (H) 05/28/2021   BUN 11 05/28/2021   CREATININE 0.83 05/28/2021   BILITOT 0.8 12/28/2020   AST 25 12/28/2020   ALT 26 12/28/2020  PROT 6.8 12/28/2020   CALCIUM 9.7 05/28/2021   ANIONGAP 11 05/18/2021   EGFR 76 05/28/2021   Lab Results  Component Value Date   CHOL 186 05/28/2021   Lab Results  Component Value Date   HDL 34 (L) 05/28/2021   Lab Results  Component Value Date   LDLCALC 83 05/28/2021   Lab Results  Component Value Date   TRIG 425 (H) 05/28/2021    Lab Results  Component Value Date   CHOLHDL 5.5 (H) 05/28/2021   Lab Results  Component Value Date   HGBA1C 6.8 (H) 05/28/2021      Assessment & Plan:   Problem List Items Addressed This Visit       Cardiovascular and Mediastinum   Hypertension    BP Readings from Last 1 Encounters:  09/27/21 133/81  Well-controlled with  Losartan-HCTZ 100-12.5 mg QD and Metoprolol 25 mg QD Counseled for compliance with the medications Advised DASH diet and moderate exercise/walking, at least 150 mins/week      Relevant Medications   Pitavastatin Calcium (LIVALO) 2 MG TABS   Single subsegmental pulmonary embolism without acute cor pulmonale (HCC)    CT chest PE reviewed from ER visit On Xarelto Unclear etiology, but fatigue from viral illness might have provoked less mobility Continue Xarelto for 6 months - additional 1 month from now      Relevant Medications   Pitavastatin Calcium (LIVALO) 2 MG TABS     Endocrine   Diabetes mellitus, type II ([place]) - Primary    Lab Results  Component Value Date   HGBA1C 6.8 (H) 05/28/2021  On Farxiga, did not tolerate Metformin Advised to follow diabetic diet On ARB and statin      Relevant Medications   Pitavastatin Calcium (LIVALO) 2 MG TABS   Other Relevant Orders   CMP14+EGFR   Hemoglobin A1c     Other   Depression, recurrent ([place])    [place] Office Visit from 09/27/2021 in [facility]  PHQ-9 Total Score 4  Uncontrolled with Wellbutrin Had severe anxiety and insomnia with Zoloft 50 mg QD, discontinued Did not have benefit with Lexapro in the past Referred to [facility] therapy in [facility]      Relevant Orders   Ambulatory referral to Psychiatry   Hyperlipidemia    Lipid profile reviewed, needs to take statin regularly On Vascepa      Relevant Medications   Pitavastatin Calcium (LIVALO) 2 MG TABS   Other Relevant Orders   Lipid Profile   Other Visit Diagnoses     Anticoagulated       Relevant Orders   CBC  with Differential/Platelet       Meds ordered this encounter  Medications   Pitavastatin Calcium (LIVALO) 2 MG TABS    Sig: Take 1 tablet at bedtime for cholesterol    Dispense:  90 tablet    Refill:  1    Follow-up: Return in about 6 months (around 03/27/2022) for DM and HTN.    Lindell Spar, MD

## 2021-09-29 DIAGNOSIS — G4733 Obstructive sleep apnea (adult) (pediatric): Secondary | ICD-10-CM | POA: Diagnosis not present

## 2021-10-07 ENCOUNTER — Encounter: Payer: Medicare Other | Admitting: Clinical

## 2021-10-08 ENCOUNTER — Other Ambulatory Visit: Payer: Self-pay

## 2021-10-08 ENCOUNTER — Ambulatory Visit (INDEPENDENT_AMBULATORY_CARE_PROVIDER_SITE_OTHER): Payer: Medicare Other | Admitting: Internal Medicine

## 2021-10-08 ENCOUNTER — Encounter: Payer: Self-pay | Admitting: Internal Medicine

## 2021-10-08 DIAGNOSIS — J0100 Acute maxillary sinusitis, unspecified: Secondary | ICD-10-CM

## 2021-10-08 MED ORDER — NOREL AD 4-10-325 MG PO TABS: 1.0000 | ORAL_TABLET | Freq: Three times a day (TID) | ORAL | 0 refills | Status: DC | PRN

## 2021-10-08 MED ORDER — AMOXICILLIN-POT CLAVULANATE 875-125 MG PO TABS: 1.0000 | ORAL_TABLET | Freq: Two times a day (BID) | ORAL | 0 refills | Status: DC

## 2021-10-08 NOTE — Progress Notes (Signed)
Virtual Visit via Telephone Note   This visit type was conducted due to national recommendations for restrictions regarding the COVID-19 Pandemic (e.g. social distancing) in an effort to limit this patient's exposure and mitigate transmission in our community.  Due to her co-morbid illnesses, this patient is at least at moderate risk for complications without adequate follow up.  This format is felt to be most appropriate for this patient at this time.  The patient did not have access to video technology/had technical difficulties with video requiring transitioning to audio format only (telephone).  All issues noted in this document were discussed and addressed.  No physical exam could be performed with this format.   Evaluation Performed:  Follow-up visit  Date:  10/08/2021   ID:  Carolyn [name], DOB [birth year], MRN [id number]  Patient Location: Home Provider Location: Office/Clinic  Participants: Patient Location of Patient: Home Location of Provider: Telehealth Consent was obtain for visit to be over via telehealth. I verified that I am speaking with the correct person using two identifiers.  PCP:  Lindell Spar, MD   Chief Complaint: Cough, sore throat and postnasal drip  History of Present Illness:    Carolyn [name] is a 70 y.o. female who has a televisit for complaint of cough, sore throat and postnasal drip for about 5 days.  She also reports sinus pressure related headache and myalgias.  She denies any fever, dyspnea or wheezing currently.  She has not tried any OTC medicine yet.  She has had COVID and flu vaccine.  The patient does have symptoms concerning for COVID-19 infection (fever, chills, cough, or new shortness of breath).   Past Medical, Surgical, Social History, Allergies, and Medications have been Reviewed.  Past Medical History:  Diagnosis Date   Depression    Diabetes mellitus without complication ([place])    Diastolic dysfunction    Hyperlipidemia     Myasthenia gravis ([place])    Patient is Jehovah's Witness    Sleep apnea    Past Surgical History:  Procedure Laterality Date   APPENDECTOMY     BIOPSY  11/11/2020   Procedure: BIOPSY;  Surgeon: Harvel Quale, MD;  [facility];  Service: Gastroenterology;;  duodenum gastric   BUNIONECTOMY     COLONOSCOPY WITH PROPOFOL N/A 11/11/2020   Procedure: COLONOSCOPY WITH PROPOFOL;  Surgeon: Harvel Quale, MD;  [facility];  Service: Gastroenterology;  Laterality: N/A;  11:15   ESOPHAGOGASTRODUODENOSCOPY (EGD) WITH PROPOFOL N/A 11/11/2020   Procedure: ESOPHAGOGASTRODUODENOSCOPY (EGD) WITH PROPOFOL;  Surgeon: Harvel Quale, MD;  [facility];  Service: Gastroenterology;  Laterality: N/A;  to arrive at [place], ADENOIDECTOMY, BILATERAL MYRINGOTOMY AND TUBES       Current Meds  Medication Sig   amoxicillin-clavulanate (AUGMENTIN) 875-125 MG tablet Take 1 tablet by mouth 2 (two) times daily.   benzonatate (TESSALON) 100 MG capsule Take 1 capsule (100 mg total) by mouth 2 (two) times daily as needed for cough.   Blood Glucose Monitoring Suppl (BLOOD GLUCOSE SYSTEM PAK) KIT Use as directed to monitor FSBS 2x daily. Dx: E11.9   Chlorphen-PE-Acetaminophen (NOREL AD) 4-10-325 MG TABS Take 1 tablet by mouth 3 (three) times daily as needed.   Cholecalciferol (VITAMIN D3) 125 MCG (5000 UT) TABS Take 5,000 Units by mouth daily.   dapagliflozin propanediol (FARXIGA) 10 MG TABS tablet Take 1 tablet (10 mg total) by mouth daily before breakfast.  dicyclomine (BENTYL) 10 MG capsule Take 1 capsule (10 mg total) by mouth every 12 (twelve) hours as needed for spasms.   glucose blood (ONETOUCH VERIO) test strip USE TO TEST BLOOD SUGAR TWICE DAILY   Glycerin-Hypromellose-PEG 400 (DRY EYE RELIEF DROPS) 0.2-0.2-1 % SOLN Place 1 drop into both eyes daily as needed (Dry eye).   hydrOXYzine (VISTARIL) 25 MG capsule Take 1  capsule (25 mg total) by mouth at bedtime as needed for anxiety (Insomnia).   icosapent Ethyl (VASCEPA) 1 g capsule TAKE 1 CAPSULE(1 GRAM) BY MOUTH DAILY   ketoconazole (NIZORAL) 2 % cream Apply 1 application topically daily. (Patient taking differently: Apply 1 application topically daily as needed for irritation.)   losartan-hydrochlorothiazide (HYZAAR) 100-12.5 MG tablet TAKE 1 TABLET BY MOUTH DAILY   metoprolol succinate (TOPROL-XL) 25 MG 24 hr tablet TAKE 1 TABLET(25 MG) BY MOUTH DAILY   Multiple Vitamins-Minerals (MULTI COMPLETE) CAPS Take 1 capsule by mouth daily.   Pitavastatin Calcium (LIVALO) 2 MG TABS Take 1 tablet at bedtime for cholesterol   rivaroxaban (XARELTO) 20 MG TABS tablet Take 1 tablet (20 mg total) by mouth daily with supper.   WELLBUTRIN XL 150 MG 24 hr tablet TAKE 1 TABLET BY MOUTH ONCE DAILY (Patient taking differently: Take 150 mg by mouth daily.)     Allergies:   Prozac [fluoxetine hcl], Codeine, Other, and Sulfa antibiotics   ROS:   Please see the history of present illness.     All other systems reviewed and are negative.   Labs/Other Tests and Data Reviewed:    Recent Labs: 12/28/2020: ALT 26 05/28/2021: BUN 11; Creatinine, Ser 0.83; Hemoglobin 15.8; Platelets 232; Potassium 4.4; Sodium 140; TSH 3.140   Recent Lipid Panel Lab Results  Component Value Date/Time   CHOL 186 05/28/2021 09:18 AM   TRIG 425 (H) 05/28/2021 09:18 AM   HDL 34 (L) 05/28/2021 09:18 AM   CHOLHDL 5.5 (H) 05/28/2021 09:18 AM   CHOLHDL 6.0 (H) 12/28/2020 08:27 AM   LDLCALC 83 05/28/2021 09:18 AM   LDLCALC 130 (H) 12/28/2020 08:27 AM    Wt Readings from Last 3 Encounters:  09/27/21 191 lb 1.9 oz (86.7 kg)  05/27/21 201 lb 6.4 oz (91.4 kg)  05/18/21 209 lb 7 oz (95 kg)     ASSESSMENT & PLAN:    Acute sinusitis Started Augmentin Norel as needed for nasal congestion Nasal saline spray as needed for nasal congestion Mucinex or Robitussin as needed for cough  Time:   Today,  I have spent 9 minutes reviewing the chart, including problem list, medications, and with the patient with telehealth technology discussing the above problems.   Medication Adjustments/Labs and Tests Ordered: Current medicines are reviewed at length with the patient today.  Concerns regarding medicines are outlined above.   Tests Ordered: No orders of the defined types were placed in this encounter.   Medication Changes: Meds ordered this encounter  Medications   amoxicillin-clavulanate (AUGMENTIN) 875-125 MG tablet    Sig: Take 1 tablet by mouth 2 (two) times daily.    Dispense:  14 tablet    Refill:  0   Chlorphen-PE-Acetaminophen (NOREL AD) 4-10-325 MG TABS    Sig: Take 1 tablet by mouth 3 (three) times daily as needed.    Dispense:  30 tablet    Refill:  0     Note: This dictation was prepared with Dragon dictation along with smaller phrase technology. Similar sounding words can be transcribed inadequately or may not be corrected  upon review. Any transcriptional errors that result from this process are unintentional.      Disposition:  Follow up  Signed, Lindell Spar, MD  10/08/2021 8:09 AM     Spray

## 2021-10-21 ENCOUNTER — Other Ambulatory Visit: Payer: Self-pay | Admitting: *Deleted

## 2021-10-21 ENCOUNTER — Encounter: Payer: Self-pay | Admitting: Pulmonary Disease

## 2021-10-21 ENCOUNTER — Ambulatory Visit: Payer: Medicare Other | Admitting: Pulmonary Disease

## 2021-10-21 ENCOUNTER — Other Ambulatory Visit: Payer: Self-pay

## 2021-10-21 VITALS — BP 130/82 | HR 63 | Temp 98.6°F | Ht 63.0 in | Wt 190.0 lb

## 2021-10-21 DIAGNOSIS — G4733 Obstructive sleep apnea (adult) (pediatric): Secondary | ICD-10-CM

## 2021-10-21 MED ORDER — DAPAGLIFLOZIN PROPANEDIOL 10 MG PO TABS: 10.0000 mg | ORAL_TABLET | Freq: Every day | ORAL | 2 refills | Status: DC

## 2021-10-21 NOTE — Progress Notes (Signed)
[facility]  Chief Complaint  Patient presents with   Follow-up    CPAP is working well.     Past Surgical History:  She  has a past surgical history that includes Tonsilectomy, adenoidectomy, bilateral myringotomy and tubes; Appendectomy; Mandible surgery; Bunionectomy; Colonoscopy with propofol (N/A, 11/11/2020); Esophagogastroduodenoscopy (egd) with propofol (N/A, 11/11/2020); and biopsy (11/11/2020).  Past Medical History:  Depression, DM, Diastolic CHF, HLD, Myasthenia gravis, Jehovah's witness, PE July 2022  Constitutional:  Ht 5\' 3"  (1.6 m)    Wt 190 lb (86.2 kg)    BMI 33.66 kg/m   Brief Summary:  Carolyn [name] is a 70 y.o. female with obstructive sleep apnea.      Subjective:   She received her new CPAP machine.  Had setting changed to auto 5 to 10.  This has been more comfortable.  No issues with mask fit.  Developed episode of chest pain in July.  This was in mid chest and around her back.  She went had CT angio chest with showed small PE in RLL.  She was started on xarelto.  Physical Exam:   Appearance - well kempt   ENMT - no sinus tenderness, no oral exudate, no LAN, Mallampati 3 airway, no stridor  Respiratory - equal breath sounds bilaterally, no wheezing or rales  CV - s1s2 regular rate and rhythm, no murmurs  Ext - no clubbing, no edema  Skin - no rashes  Psych - normal mood and affect   Chest Imaging:  CT angio chest 05/18/21 >> small distal segmental and subsegmental PE in RLL  Sleep Tests:  HST 10/27/15 >> AHI 45 CPAP 09/19/21 to 10/18/21 >> used on 27 of 30 nights with average 7 hrs 18 min.  Average AHI 0.3 with median CPAP 9 and 95 th percentile CPAP 10 cm H2O  Cardiac Tests:  Echo 01/07/19 >> AHI 50 to 55%, mild LVH  Social History:  She  reports that she has never smoked. She has never used smokeless tobacco. She reports current alcohol use. She reports that she does not use drugs.  Family  History:  Her family history includes Alcohol abuse in her father; Arthritis in her mother; Depression in her mother; Diabetes in her brother, brother, brother, and mother; Heart disease in her brother; Heart disease (age of onset: 19) in her father; Hypertension in her father and mother.     Assessment/Plan:   Obstructive sleep apnea. - she is compliant with CPAP and reports benefit from therapy - she uses 77 for her DME - continue auto CPAP 5 to 10 cm H2O  Rt lower lobe segmental/subsegmental pulmonary embolism from July 2022. - she will complete 6 months of xarelto in January 2023 - discussed symptoms/signs to monitor for that would indicate a recurrence of thromboembolic disease - she reports not being able to tolerate aspirin due to prior GI issues  Time Spent Involved in Patient Care on Day of Examination:  24 minutes  Follow up:  There are no Patient Instructions on file for this visit.  Medication List:   Allergies as of 10/21/2021       Reactions   Prozac [fluoxetine Hcl] Anaphylaxis   Codeine Other (See Comments)   Intolerance   Other    -mycin medications: has myasthenia gravis   Sulfa Antibiotics Hives        Medication List        Accurate as of October 21, 2021  9:33  AM. If you have any questions, ask your nurse or doctor.          STOP taking these medications    amoxicillin-clavulanate 875-125 MG tablet Commonly known as: AUGMENTIN Stopped by: Chesley Mires, MD   benzonatate 100 MG capsule Commonly known as: TESSALON Stopped by: Chesley Mires, MD       TAKE these medications    Blood Glucose System Pak Kit Use as directed to monitor FSBS 2x daily. Dx: E11.9   dapagliflozin propanediol 10 MG Tabs tablet Commonly known as: Farxiga Take 1 tablet (10 mg total) by mouth daily before breakfast.   dicyclomine 10 MG capsule Commonly known as: Bentyl Take 1 capsule (10 mg total) by mouth every 12 (twelve) hours as needed for  spasms.   Dry Eye Relief Drops 0.2-0.2-1 % Soln Generic drug: Glycerin-Hypromellose-PEG 400 Place 1 drop into both eyes daily as needed (Dry eye).   hydrOXYzine 25 MG capsule Commonly known as: VISTARIL Take 1 capsule (25 mg total) by mouth at bedtime as needed for anxiety (Insomnia).   icosapent Ethyl 1 g capsule Commonly known as: VASCEPA TAKE 1 CAPSULE(1 GRAM) BY MOUTH DAILY   ketoconazole 2 % cream Commonly known as: NIZORAL Apply 1 application topically daily. What changed:  when to take this reasons to take this   Livalo 2 MG Tabs Generic drug: Pitavastatin Calcium Take 1 tablet at bedtime for cholesterol   losartan-hydrochlorothiazide 100-12.5 MG tablet Commonly known as: HYZAAR TAKE 1 TABLET BY MOUTH DAILY   metoprolol succinate 25 MG 24 hr tablet Commonly known as: TOPROL-XL TAKE 1 TABLET(25 MG) BY MOUTH DAILY   Multi Complete Caps Take 1 capsule by mouth daily.   Norel AD 4-10-325 MG Tabs Generic drug: Chlorphen-PE-Acetaminophen Take 1 tablet by mouth 3 (three) times daily as needed.   OneTouch Verio test strip Generic drug: glucose blood USE TO TEST BLOOD SUGAR TWICE DAILY   rivaroxaban 20 MG Tabs tablet Commonly known as: XARELTO Take 1 tablet (20 mg total) by mouth daily with supper.   Vitamin D3 125 MCG (5000 UT) Tabs Take 5,000 Units by mouth daily.   Wellbutrin XL 150 MG 24 hr tablet Generic drug: buPROPion TAKE 1 TABLET BY MOUTH ONCE DAILY What changed:  how much to take how to take this when to take this additional instructions        Signature:  Chesley Mires, MD [place] Pager - [phone] 10/21/2021, 9:33 AM

## 2021-10-21 NOTE — Patient Instructions (Signed)
Follow up in 1 year.

## 2021-11-10 ENCOUNTER — Ambulatory Visit (INDEPENDENT_AMBULATORY_CARE_PROVIDER_SITE_OTHER): Payer: Medicare Other | Admitting: Internal Medicine

## 2021-11-10 ENCOUNTER — Encounter: Payer: Self-pay | Admitting: Internal Medicine

## 2021-11-10 ENCOUNTER — Other Ambulatory Visit: Payer: Self-pay

## 2021-11-10 DIAGNOSIS — Z7901 Long term (current) use of anticoagulants: Secondary | ICD-10-CM | POA: Diagnosis not present

## 2021-11-10 DIAGNOSIS — N3000 Acute cystitis without hematuria: Secondary | ICD-10-CM | POA: Diagnosis not present

## 2021-11-10 DIAGNOSIS — E782 Mixed hyperlipidemia: Secondary | ICD-10-CM | POA: Diagnosis not present

## 2021-11-10 DIAGNOSIS — E1169 Type 2 diabetes mellitus with other specified complication: Secondary | ICD-10-CM | POA: Diagnosis not present

## 2021-11-10 DIAGNOSIS — R300 Dysuria: Secondary | ICD-10-CM | POA: Diagnosis not present

## 2021-11-10 LAB — POCT URINALYSIS DIP (CLINITEK)
Bilirubin, UA: NEGATIVE
Glucose, UA: >=1000 mg/dL — AB
Ketones, POC UA: NEGATIVE mg/dL
Nitrite, UA: NEGATIVE
POC PROTEIN,UA: NEGATIVE
Spec Grav, UA: <=1.005 — AB (ref 1.010–1.025)
Urobilinogen, UA: 0.2 E.U./dL
pH, UA: 5 (ref 5.0–8.0)

## 2021-11-10 MED ORDER — NITROFURANTOIN MONOHYD MACRO 100 MG PO CAPS: 100.0000 mg | ORAL_CAPSULE | Freq: Two times a day (BID) | ORAL | 0 refills | Status: DC

## 2021-11-10 NOTE — Progress Notes (Signed)
Virtual Visit via Telephone Note   This visit type was conducted due to national recommendations for restrictions regarding the COVID-19 Pandemic (e.g. social distancing) in an effort to limit this patient's exposure and mitigate transmission in our community.  Due to her co-morbid illnesses, this patient is at least at moderate risk for complications without adequate follow up.  This format is felt to be most appropriate for this patient at this time.  The patient did not have access to video technology/had technical difficulties with video requiring transitioning to audio format only (telephone).  All issues noted in this document were discussed and addressed.  No physical exam could be performed with this format.  Evaluation Performed:  Follow-up visit  Date:  11/10/2021   ID:  Carolyn [name], DOB 1951/05/04, MRN [id number]  Patient Location: Home Provider Location: Office/Clinic  Participants: Patient Location of Patient: Home Location of Provider: Telehealth Consent was obtain for visit to be over via telehealth. I verified that I am speaking with the correct person using two identifiers.  PCP:  Lindell Spar, MD   Chief Complaint: Dysuria  History of Present Illness:    Carolyn [name] is a 71 y.o. female who has a televisit for complaint of dysuria for the last 4 weeks.  She does have mild lower abdominal pressure, but denies any fever, chills, nausea, vomiting or hematuria.  Of note, she is on [place] for type II DM.  The patient does not have symptoms concerning for COVID-19 infection (fever, chills, cough, or new shortness of breath).   Past Medical, Surgical, Social History, Allergies, and Medications have been Reviewed.  Past Medical History:  Diagnosis Date   Depression    Diabetes mellitus without complication ([place])    Diastolic dysfunction    Hyperlipidemia    Myasthenia gravis ([place])    Patient is Jehovah's Witness    Pulmonary embolism ([place]) 05/18/2021    Sleep apnea    Past Surgical History:  Procedure Laterality Date   APPENDECTOMY     BIOPSY  11/11/2020   Procedure: BIOPSY;  Surgeon: Harvel Quale, MD;  [facility];  Service: Gastroenterology;;  duodenum gastric   BUNIONECTOMY     COLONOSCOPY WITH PROPOFOL N/A 11/11/2020   Procedure: COLONOSCOPY WITH PROPOFOL;  Surgeon: Harvel Quale, MD;  [facility];  Service: Gastroenterology;  Laterality: N/A;  11:15   ESOPHAGOGASTRODUODENOSCOPY (EGD) WITH PROPOFOL N/A 11/11/2020   Procedure: ESOPHAGOGASTRODUODENOSCOPY (EGD) WITH PROPOFOL;  Surgeon: Harvel Quale, MD;  [facility];  Service: Gastroenterology;  Laterality: N/A;  to arrive at [place], ADENOIDECTOMY, BILATERAL MYRINGOTOMY AND TUBES       Current Meds  Medication Sig   Blood Glucose Monitoring Suppl (BLOOD GLUCOSE SYSTEM PAK) KIT Use as directed to monitor FSBS 2x daily. Dx: E11.9   Chlorphen-PE-Acetaminophen (NOREL AD) 4-10-325 MG TABS Take 1 tablet by mouth 3 (three) times daily as needed.   Cholecalciferol (VITAMIN D3) 125 MCG (5000 UT) TABS Take 5,000 Units by mouth daily.   dapagliflozin propanediol (FARXIGA) 10 MG TABS tablet Take 1 tablet (10 mg total) by mouth daily before breakfast.   dicyclomine (BENTYL) 10 MG capsule Take 1 capsule (10 mg total) by mouth every 12 (twelve) hours as needed for spasms.   glucose blood (ONETOUCH VERIO) test strip USE TO TEST BLOOD SUGAR TWICE DAILY   Glycerin-Hypromellose-PEG 400 (DRY EYE RELIEF DROPS) 0.2-0.2-1 % SOLN Place  1 drop into both eyes daily as needed (Dry eye).   hydrOXYzine (VISTARIL) 25 MG capsule Take 1 capsule (25 mg total) by mouth at bedtime as needed for anxiety (Insomnia).   icosapent Ethyl (VASCEPA) 1 g capsule TAKE 1 CAPSULE(1 GRAM) BY MOUTH DAILY   ketoconazole (NIZORAL) 2 % cream Apply 1 application topically daily. (Patient taking differently: Apply 1 application  topically daily as needed for irritation.)   losartan-hydrochlorothiazide (HYZAAR) 100-12.5 MG tablet TAKE 1 TABLET BY MOUTH DAILY   metoprolol succinate (TOPROL-XL) 25 MG 24 hr tablet TAKE 1 TABLET(25 MG) BY MOUTH DAILY   Multiple Vitamins-Minerals (MULTI COMPLETE) CAPS Take 1 capsule by mouth daily.   Pitavastatin Calcium (LIVALO) 2 MG TABS Take 1 tablet at bedtime for cholesterol   rivaroxaban (XARELTO) 20 MG TABS tablet Take 1 tablet (20 mg total) by mouth daily with supper.   WELLBUTRIN XL 150 MG 24 hr tablet TAKE 1 TABLET BY MOUTH ONCE DAILY (Patient taking differently: Take 150 mg by mouth daily.)     Allergies:   Prozac [fluoxetine hcl], Codeine, Other, and Sulfa antibiotics   ROS:   Please see the history of present illness.     All other systems reviewed and are negative.   Labs/Other Tests and Data Reviewed:    Recent Labs: 12/28/2020: ALT 26 05/28/2021: BUN 11; Creatinine, Ser 0.83; Hemoglobin 15.8; Platelets 232; Potassium 4.4; Sodium 140; TSH 3.140   Recent Lipid Panel Lab Results  Component Value Date/Time   CHOL 186 05/28/2021 09:18 AM   TRIG 425 (H) 05/28/2021 09:18 AM   HDL 34 (L) 05/28/2021 09:18 AM   CHOLHDL 5.5 (H) 05/28/2021 09:18 AM   CHOLHDL 6.0 (H) 12/28/2020 08:27 AM   LDLCALC 83 05/28/2021 09:18 AM   LDLCALC 130 (H) 12/28/2020 08:27 AM    Wt Readings from Last 3 Encounters:  10/21/21 190 lb (86.2 kg)  09/27/21 191 lb 1.9 oz (86.7 kg)  05/27/21 201 lb 6.4 oz (91.4 kg)     ASSESSMENT & PLAN:    UTI UA reviewed Check urine culture Started Macrobid Advised to maintain adequate hydration   Time:   Today, I have spent 6 minutes reviewing the chart, including problem list, medications, and with the patient with telehealth technology discussing the above problems.   Medication Adjustments/Labs and Tests Ordered: Current medicines are reviewed at length with the patient today.  Concerns regarding medicines are outlined above.   Tests  Ordered: Orders Placed This Encounter  Procedures   Urine Culture   POCT URINALYSIS DIP (CLINITEK)    Medication Changes: No orders of the defined types were placed in this encounter.    Note: This dictation was prepared with Dragon dictation along with smaller phrase technology. Similar sounding words can be transcribed inadequately or may not be corrected upon review. Any transcriptional errors that result from this process are unintentional.      Disposition:  Follow up  Signed, Lindell Spar, MD  11/10/2021 10:00 AM     [facility]

## 2021-11-11 ENCOUNTER — Ambulatory Visit: Payer: Medicare Other | Admitting: Internal Medicine

## 2021-11-11 LAB — CMP14+EGFR
ALT: 18 IU/L (ref 0–32)
AST: 22 IU/L (ref 0–40)
Albumin/Globulin Ratio: 1.7 (ref 1.2–2.2)
Albumin: 4.6 g/dL (ref 3.8–4.8)
Alkaline Phosphatase: 82 IU/L (ref 44–121)
BUN/Creatinine Ratio: 14 (ref 12–28)
BUN: 11 mg/dL (ref 8–27)
Bilirubin Total: 0.6 mg/dL (ref 0.0–1.2)
CO2: 21 mmol/L (ref 20–29)
Calcium: 9.5 mg/dL (ref 8.7–10.3)
Chloride: 99 mmol/L (ref 96–106)
Creatinine, Ser: 0.81 mg/dL (ref 0.57–1.00)
Globulin, Total: 2.7 g/dL (ref 1.5–4.5)
Glucose: 139 mg/dL — ABNORMAL HIGH (ref 70–99)
Potassium: 4.5 mmol/L (ref 3.5–5.2)
Sodium: 139 mmol/L (ref 134–144)
Total Protein: 7.3 g/dL (ref 6.0–8.5)
eGFR: 78 mL/min/{1.73_m2} (ref >59)

## 2021-11-11 LAB — CBC WITH DIFFERENTIAL/PLATELET
Basophils Absolute: 0.1 10*3/uL (ref 0.0–0.2)
Basos: 1 %
EOS (ABSOLUTE): 0.2 10*3/uL (ref 0.0–0.4)
Eos: 3 %
Hematocrit: 44.6 % (ref 34.0–46.6)
Hemoglobin: 15.7 g/dL (ref 11.1–15.9)
Immature Grans (Abs): 0 10*3/uL (ref 0.0–0.1)
Immature Granulocytes: 0 %
Lymphocytes Absolute: 2.1 10*3/uL (ref 0.7–3.1)
Lymphs: 31 %
MCH: 32 pg (ref 26.6–33.0)
MCHC: 35.2 g/dL (ref 31.5–35.7)
MCV: 91 fL (ref 79–97)
Monocytes Absolute: 0.8 10*3/uL (ref 0.1–0.9)
Monocytes: 11 %
Neutrophils Absolute: 3.5 10*3/uL (ref 1.4–7.0)
Neutrophils: 54 %
Platelets: 246 10*3/uL (ref 150–450)
RBC: 4.91 x10E6/uL (ref 3.77–5.28)
RDW: 13 % (ref 11.7–15.4)
WBC: 6.6 10*3/uL (ref 3.4–10.8)

## 2021-11-11 LAB — LIPID PANEL
Chol/HDL Ratio: 4.1 ratio (ref 0.0–4.4)
Cholesterol, Total: 149 mg/dL (ref 100–199)
HDL: 36 mg/dL — ABNORMAL LOW (ref >39)
LDL Chol Calc (NIH): 68 mg/dL (ref 0–99)
Triglycerides: 279 mg/dL — ABNORMAL HIGH (ref 0–149)
VLDL Cholesterol Cal: 45 mg/dL — ABNORMAL HIGH (ref 5–40)

## 2021-11-14 LAB — URINE CULTURE

## 2021-11-15 ENCOUNTER — Other Ambulatory Visit: Payer: Self-pay | Admitting: Internal Medicine

## 2021-11-15 DIAGNOSIS — I2693 Single subsegmental pulmonary embolism without acute cor pulmonale: Secondary | ICD-10-CM

## 2021-11-19 ENCOUNTER — Other Ambulatory Visit: Payer: Self-pay | Admitting: Internal Medicine

## 2021-11-19 DIAGNOSIS — I10 Essential (primary) hypertension: Secondary | ICD-10-CM

## 2021-11-20 ENCOUNTER — Other Ambulatory Visit: Payer: Self-pay | Admitting: Family Medicine

## 2021-12-08 ENCOUNTER — Other Ambulatory Visit: Payer: Self-pay

## 2021-12-08 ENCOUNTER — Other Ambulatory Visit: Payer: Self-pay | Admitting: Internal Medicine

## 2021-12-08 ENCOUNTER — Encounter: Payer: Self-pay | Admitting: Internal Medicine

## 2021-12-08 ENCOUNTER — Ambulatory Visit (INDEPENDENT_AMBULATORY_CARE_PROVIDER_SITE_OTHER): Payer: Medicare Other | Admitting: Internal Medicine

## 2021-12-08 DIAGNOSIS — N3946 Mixed incontinence: Secondary | ICD-10-CM | POA: Diagnosis not present

## 2021-12-08 DIAGNOSIS — R350 Frequency of micturition: Secondary | ICD-10-CM

## 2021-12-08 DIAGNOSIS — N760 Acute vaginitis: Secondary | ICD-10-CM

## 2021-12-08 LAB — POCT URINALYSIS DIP (CLINITEK)
Bilirubin, UA: NEGATIVE
Blood, UA: NEGATIVE
Glucose, UA: 500 mg/dL — AB
Ketones, POC UA: NEGATIVE mg/dL
Leukocytes, UA: NEGATIVE
Nitrite, UA: NEGATIVE
POC PROTEIN,UA: NEGATIVE
Spec Grav, UA: 1.015 (ref 1.010–1.025)
Urobilinogen, UA: 0.2 E.U./dL
pH, UA: 5.5 (ref 5.0–8.0)

## 2021-12-08 MED ORDER — OXYBUTYNIN CHLORIDE ER 10 MG PO TB24: 10.0000 mg | ORAL_TABLET | Freq: Every day | ORAL | 0 refills | Status: DC

## 2021-12-08 MED ORDER — FLUCONAZOLE 150 MG PO TABS: 150.0000 mg | ORAL_TABLET | Freq: Once | ORAL | 0 refills | Status: AC

## 2021-12-08 NOTE — Patient Instructions (Signed)
Please perform Kegel exercises for urinary incontinence.  Please start taking oxybutynin for urge incontinence.

## 2021-12-08 NOTE — Progress Notes (Signed)
Virtual Visit via Telephone Note   This visit type was conducted due to national recommendations for restrictions regarding the COVID-19 Pandemic (e.g. social distancing) in an effort to limit this patient's exposure and mitigate transmission in our community.  Due to her co-morbid illnesses, this patient is at least at moderate risk for complications without adequate follow up.  This format is felt to be most appropriate for this patient at this time.  The patient did not have access to video technology/had technical difficulties with video requiring transitioning to audio format only (telephone).  All issues noted in this document were discussed and addressed.  No physical exam could be performed with this format.  Evaluation Performed:  Follow-up visit  Date:  12/08/2021   ID:  Carolyn YARBOUGH, DOB Aug 06, 1951, MRN [id number]  Patient Location: Home Provider Location: Office/Clinic  Participants: Patient Location of Patient: Home Location of Provider: Telehealth Consent was obtain for visit to be over via telehealth. I verified that I am speaking with the correct person using two identifiers.  PCP:  Lindell Spar, MD   Chief Complaint: Dysuria and urinary incontinence  History of Present Illness:    Carolyn [name] is a 71 y.o. female who has a televisit for complaint of dysuria, urethral burning and urinary incontinence.  She has urethral burning for the last 2 days.  She was recently treated with Macrobid for UTI.  She denies any fever, chills, nausea or vomiting currently.  Denies any hematuria.  She has chronic urinary urgency, but has noticed urinary frequency lately as well.  She did PT in the past for stress incontinence.  The patient does not have symptoms concerning for COVID-19 infection (fever, chills, cough, or new shortness of breath).   Past Medical, Surgical, Social History, Allergies, and Medications have been Reviewed.  Past Medical History:  Diagnosis Date    Depression    Diabetes mellitus without complication ([place])    Diastolic dysfunction    Hyperlipidemia    Myasthenia gravis ([place])    Patient is Jehovah's Witness    Pulmonary embolism ([place]) 05/18/2021   Sleep apnea    Past Surgical History:  Procedure Laterality Date   APPENDECTOMY     BIOPSY  11/11/2020   Procedure: BIOPSY;  Surgeon: Harvel Quale, MD;  [facility];  Service: Gastroenterology;;  duodenum gastric   BUNIONECTOMY     COLONOSCOPY WITH PROPOFOL N/A 11/11/2020   Procedure: COLONOSCOPY WITH PROPOFOL;  Surgeon: Harvel Quale, MD;  [facility];  Service: Gastroenterology;  Laterality: N/A;  11:15   ESOPHAGOGASTRODUODENOSCOPY (EGD) WITH PROPOFOL N/A 11/11/2020   Procedure: ESOPHAGOGASTRODUODENOSCOPY (EGD) WITH PROPOFOL;  Surgeon: Harvel Quale, MD;  [facility];  Service: Gastroenterology;  Laterality: N/A;  to arrive at [place], ADENOIDECTOMY, BILATERAL MYRINGOTOMY AND TUBES       Current Meds  Medication Sig   Blood Glucose Monitoring Suppl (BLOOD GLUCOSE SYSTEM PAK) KIT Use as directed to monitor FSBS 2x daily. Dx: E11.9   Chlorphen-PE-Acetaminophen (NOREL AD) 4-10-325 MG TABS Take 1 tablet by mouth 3 (three) times daily as needed.   Cholecalciferol (VITAMIN D3) 125 MCG (5000 UT) TABS Take 5,000 Units by mouth daily.   dapagliflozin propanediol (FARXIGA) 10 MG TABS tablet Take 1 tablet (10 mg total) by mouth daily before breakfast.   dicyclomine (BENTYL) 10 MG capsule Take 1 capsule (10 mg total) by mouth every 12 (twelve)  hours as needed for spasms.   glucose blood (ONETOUCH VERIO) test strip USE TO TEST BLOOD SUGAR TWICE DAILY   Glycerin-Hypromellose-PEG 400 (DRY EYE RELIEF DROPS) 0.2-0.2-1 % SOLN Place 1 drop into both eyes daily as needed (Dry eye).   hydrOXYzine (VISTARIL) 25 MG capsule Take 1 capsule (25 mg total) by mouth at bedtime as needed for anxiety (Insomnia).    icosapent Ethyl (VASCEPA) 1 g capsule TAKE 1 CAPSULE(1 GRAM) BY MOUTH DAILY   ketoconazole (NIZORAL) 2 % cream Apply 1 application topically daily. (Patient taking differently: Apply 1 application topically daily as needed for irritation.)   losartan-hydrochlorothiazide (HYZAAR) 100-12.5 MG tablet TAKE 1 TABLET BY MOUTH DAILY   metoprolol succinate (TOPROL-XL) 25 MG 24 hr tablet TAKE 1 TABLET(25 MG) BY MOUTH DAILY   Multiple Vitamins-Minerals (MULTI COMPLETE) CAPS Take 1 capsule by mouth daily.   nitrofurantoin, macrocrystal-monohydrate, (MACROBID) 100 MG capsule Take 1 capsule (100 mg total) by mouth 2 (two) times daily.   Pitavastatin Calcium (LIVALO) 2 MG TABS Take 1 tablet at bedtime for cholesterol   WELLBUTRIN XL 150 MG 24 hr tablet TAKE 1 TABLET BY MOUTH ONCE DAILY (Patient taking differently: Take 150 mg by mouth daily.)   XARELTO 20 MG TABS tablet TAKE 1 TABLET(20 MG) BY MOUTH DAILY WITH SUPPER     Allergies:   Prozac [fluoxetine hcl], Codeine, Other, and Sulfa antibiotics   ROS:   Please see the history of present illness.     All other systems reviewed and are negative.   Labs/Other Tests and Data Reviewed:    Recent Labs: 05/28/2021: TSH 3.140 11/10/2021: ALT 18; BUN 11; Creatinine, Ser 0.81; Hemoglobin 15.7; Platelets 246; Potassium 4.5; Sodium 139   Recent Lipid Panel Lab Results  Component Value Date/Time   CHOL 149 11/10/2021 08:43 AM   TRIG 279 (H) 11/10/2021 08:43 AM   HDL 36 (L) 11/10/2021 08:43 AM   CHOLHDL 4.1 11/10/2021 08:43 AM   CHOLHDL 6.0 (H) 12/28/2020 08:27 AM   LDLCALC 68 11/10/2021 08:43 AM   LDLCALC 130 (H) 12/28/2020 08:27 AM    Wt Readings from Last 3 Encounters:  10/21/21 190 lb (86.2 kg)  09/27/21 191 lb 1.9 oz (86.7 kg)  05/27/21 201 lb 6.4 oz (91.4 kg)     ASSESSMENT & PLAN:    Acute vaginitis Empiric fluconazole due to perineal discomfort and urethral burning She is on Farxiga, at risk of having vulvovaginitis  Urinary frequency  and urge incontinence Advised to perform Kegel exercises for mixed urinary incontinence Reviewed UA Added oxybutynin for urge incontinence She uses diaper pads for incontinence   Time:   Today, I have spent 15 minutes reviewing the chart, including problem list, medications, and with the patient with telehealth technology discussing the above problems.   Medication Adjustments/Labs and Tests Ordered: Current medicines are reviewed at length with the patient today.  Concerns regarding medicines are outlined above.   Tests Ordered: Orders Placed This Encounter  Procedures   POCT URINALYSIS DIP (CLINITEK)    Medication Changes: No orders of the defined types were placed in this encounter.    Note: This dictation was prepared with Dragon dictation along with smaller phrase technology. Similar sounding words can be transcribed inadequately or may not be corrected upon review. Any transcriptional errors that result from this process are unintentional.      Disposition:  Follow up  Signed, Lindell Spar, MD  12/08/2021 12:00 PM     [facility]

## 2022-01-07 ENCOUNTER — Telehealth: Payer: Self-pay | Admitting: Pulmonary Disease

## 2022-01-07 DIAGNOSIS — G4733 Obstructive sleep apnea (adult) (pediatric): Secondary | ICD-10-CM

## 2022-01-07 NOTE — Telephone Encounter (Signed)
Order placed to [employer] for new cpap supplies. Patient notified. Nothing further needed ?

## 2022-01-10 DIAGNOSIS — L638 Other alopecia areata: Secondary | ICD-10-CM | POA: Diagnosis not present

## 2022-01-10 DIAGNOSIS — D485 Neoplasm of uncertain behavior of skin: Secondary | ICD-10-CM | POA: Diagnosis not present

## 2022-01-12 DIAGNOSIS — L630 Alopecia (capitis) totalis: Secondary | ICD-10-CM | POA: Diagnosis not present

## 2022-01-12 DIAGNOSIS — L648 Other androgenic alopecia: Secondary | ICD-10-CM | POA: Diagnosis not present

## 2022-01-13 DIAGNOSIS — G4733 Obstructive sleep apnea (adult) (pediatric): Secondary | ICD-10-CM | POA: Diagnosis not present

## 2022-01-19 ENCOUNTER — Other Ambulatory Visit: Payer: Self-pay | Admitting: Internal Medicine

## 2022-01-19 DIAGNOSIS — I10 Essential (primary) hypertension: Secondary | ICD-10-CM

## 2022-02-10 ENCOUNTER — Other Ambulatory Visit: Payer: Self-pay | Admitting: Internal Medicine

## 2022-02-14 ENCOUNTER — Ambulatory Visit (INDEPENDENT_AMBULATORY_CARE_PROVIDER_SITE_OTHER): Payer: Medicare Other

## 2022-02-14 ENCOUNTER — Other Ambulatory Visit: Payer: Self-pay | Admitting: *Deleted

## 2022-02-14 DIAGNOSIS — Z0000 Encounter for general adult medical examination without abnormal findings: Secondary | ICD-10-CM

## 2022-02-14 NOTE — Patient Instructions (Signed)
?Carolyn [name] , ?Thank you for taking time to come for your Medicare Wellness Visit. I appreciate your ongoing commitment to your health goals. Please review the following plan we discussed and let me know if I can assist you in the future.  ? ?These are the goals we discussed: ? Goals   ? ?  Exercise 3x per week (30 min per time)   ?  Patient Stated   ?  Patient states that her goal is to continue taking care of herself. ?  ?  Pharmacy Care Plan:   ?  CARE PLAN ENTRY ?(see longitudinal plan of care for additional care plan information) ? ?Current Barriers:  ?Chronic Disease Management support, education, and care coordination needs related to Hypertension, Hyperlipidemia, Diabetes, and Depression ?  ?Hypertension ?BP Readings from Last 3 Encounters:  ?06/16/20 138/82  ?01/22/20 (!) 142/86  ?04/22/19 122/64  ?Pharmacist Clinical Goal(s): ?Over the next 7 days, patient will work with PharmD and providers to maintain BP goal <130/80 ?Current regimen:  ?Losartan '100mg'$  daily ?Metoprolol Succinate XL '50mg'$  ?Interventions: ?Reviewed office Bps ?Comprehensive medication review ?Patient self care activities - Over the next 7 days, patient will: ?Check BP periodically, document, and provide at future appointments ?Ensure daily salt intake < 2300 mg/day ? ?Hyperlipidemia ?Lab Results  ?Component Value Date/Time  ? [place] 68 10/18/2019 08:07 AM  ?Pharmacist Clinical Goal(s): ?Over the next 7 days, patient will work with PharmD and providers to maintain LDL goal < 70 ?Current regimen:  ?Livalo '2mg'$  daily ?Interventions: ?Reviewed most recent lipid panel ?Discussed dietary modifications to help with TG ?Patient self care activities - Over the next 7 days, patient will: ?Continue to focus on medication adherence by pill count ?Focus on diet and exercise to bring TG's closer to goal ? ?Diabetes ?Lab Results  ?Component Value Date/Time  ? HGBA1C 6.8 (H) 01/22/2020 03:57 PM  ? HGBA1C 7.4 (H) 10/18/2019 08:07 AM  ?Pharmacist Clinical  Goal(s): ?Over the next 7 days, patient will work with PharmD and providers to maintain A1c goal <7% ?Current regimen:  ?Metformin '500mg'$  daily with breakfast ?Interventions: ?Reviewed home blood sugar readings ?Reviewed last A1c ?Recommended repeat A1c ?Recommend twice daily monitoring for one week ?Counseled on diet and exercise modifications ?Patient self care activities - Over the next 7 days, patient will: ?Check blood sugar twice daily, document, and provide at future appointments ?Contact provider with any episodes of hypoglycemia ?Work on diet lower in carbohydrates and increase physical activity to 3 days weekly as tolerated ?Schedule a visit with Dr. Buelah Manis for updated A1c ? ?Depression ?Pharmacist Clinical Goal(s) ?Over the next 7 days, patient will work with PharmD and providers to optimize medication related to depression. ?Current regimen:  ?Wellbutrin XL '150mg'$  daily ?Lexapro '5mg'$  daily ?Interventions: ?Discussed current symptoms ?Reviewed current medication adherence. ?Patient self care activities - Over the next 7 days, patient will: ?Monitor herself for withdrawal symptoms such as headache, nausea, etc. ?Report any change in symptoms to providers ? ? ?Initial goal documentation  ?  ? ?  ?  ?This is a list of the screening recommended for you and due dates:  ?Health Maintenance  ?Topic Date Due  ? Tetanus Vaccine  Never done  ? Zoster (Shingles) Vaccine (1 of 2) Never done  ? COVID-19 Vaccine (3 - Booster for Moderna series) 01/09/2021  ? Complete foot exam   01/28/2022  ? Eye exam for diabetics  02/17/2022  ? Hemoglobin A1C  03/27/2022  ? Flu Shot  05/31/2022  ?  Mammogram  02/18/2023  ? Colon Cancer Screening  11/11/2030  ? Pneumonia Vaccine  Completed  ? DEXA scan (bone density measurement)  Completed  ? Hepatitis C Screening: USPSTF Recommendation to screen - Ages 78-79 yo.  Completed  ? HPV Vaccine  Aged Out  ?  ?

## 2022-02-14 NOTE — Progress Notes (Signed)
? ?Subjective:  ? Carolyn [name] is a 71 y.o. female who presents for Medicare Annual (Subsequent) preventive examination. ?I connected with  Arther Abbott on 02/14/22 by a audio enabled telemedicine application and verified that I am speaking with the correct person using two identifiers. ? ?Patient Location: Home ? ?Provider Location: Office/Clinic ? ?I discussed the limitations of evaluation and management by telemedicine. The patient expressed understanding and agreed to proceed.  ?Review of Systems    ? ?  ? ?   ?Objective:  ?  ?There were no vitals filed for this visit. ?There is no height or weight on file to calculate BMI. ? ? ?  05/18/2021  ? 10:42 AM 03/02/2021  ?  8:55 AM 02/10/2021  ?  1:16 PM 11/11/2020  ?  8:59 AM 11/28/2018  ?  9:34 AM  ?Advanced Directives  ?Does Patient Have a Medical Advance Directive? No Yes Yes Yes Yes  ?Type of Corporate treasurer of [place];Living will [place];Living will [place];Living will   ?Does patient want to make changes to medical advance directive?   No - Patient declined    ?Copy of [place] in Chart?  Yes - validated most recent copy scanned in chart (See row information) Yes - validated most recent copy scanned in chart (See row information)    ?Would patient like information on creating a medical advance directive? No - Patient declined      ? ? ?Current Medications (verified) ?Outpatient Encounter Medications as of 02/14/2022  ?Medication Sig  ? Blood Glucose Monitoring Suppl (BLOOD GLUCOSE SYSTEM PAK) KIT Use as directed to monitor FSBS 2x daily. Dx: E11.9  ? Chlorphen-PE-Acetaminophen (NOREL AD) 4-10-325 MG TABS Take 1 tablet by mouth 3 (three) times daily as needed.  ? Cholecalciferol (VITAMIN D3) 125 MCG (5000 UT) TABS Take 5,000 Units by mouth daily.  ? dapagliflozin propanediol (FARXIGA) 10 MG TABS tablet Take 1 tablet (10 mg total) by mouth daily before breakfast.  ? dicyclomine  (BENTYL) 10 MG capsule Take 1 capsule (10 mg total) by mouth every 12 (twelve) hours as needed for spasms.  ? glucose blood (ONETOUCH VERIO) test strip USE TO TEST BLOOD SUGAR TWICE DAILY  ? Glycerin-Hypromellose-PEG 400 (DRY EYE RELIEF DROPS) 0.2-0.2-1 % SOLN Place 1 drop into both eyes daily as needed (Dry eye).  ? hydrOXYzine (VISTARIL) 25 MG capsule Take 1 capsule (25 mg total) by mouth at bedtime as needed for anxiety (Insomnia).  ? icosapent Ethyl (VASCEPA) 1 g capsule TAKE 1 CAPSULE(1 GRAM) BY MOUTH DAILY  ? ketoconazole (NIZORAL) 2 % cream Apply 1 application topically daily. (Patient taking differently: Apply 1 application topically daily as needed for irritation.)  ? losartan-hydrochlorothiazide (HYZAAR) 100-12.5 MG tablet TAKE 1 TABLET BY MOUTH DAILY  ? metoprolol succinate (TOPROL-XL) 25 MG 24 hr tablet TAKE 1 TABLET(25 MG) BY MOUTH DAILY  ? Multiple Vitamins-Minerals (MULTI COMPLETE) CAPS Take 1 capsule by mouth daily.  ? oxybutynin (DITROPAN-XL) 10 MG 24 hr tablet TAKE 1 TABLET(10 MG) BY MOUTH AT BEDTIME  ? Pitavastatin Calcium (LIVALO) 2 MG TABS Take 1 tablet at bedtime for cholesterol  ? WELLBUTRIN XL 150 MG 24 hr tablet TAKE 1 TABLET BY MOUTH ONCE DAILY (Patient taking differently: Take 150 mg by mouth daily.)  ? XARELTO 20 MG TABS tablet TAKE 1 TABLET(20 MG) BY MOUTH DAILY WITH SUPPER  ? ?No facility-administered encounter medications on file as of 02/14/2022.  ? ? ?  Allergies (verified) ?Prozac [fluoxetine hcl], Codeine, Other, and Sulfa antibiotics  ? ?History: ?Past Medical History:  ?Diagnosis Date  ? Depression   ? Diabetes mellitus without complication ([place])   ? Diastolic dysfunction   ? Hyperlipidemia   ? Myasthenia gravis ([place])   ? Patient is Jehovah's Witness   ? Pulmonary embolism ([place]) 05/18/2021  ? Sleep apnea   ? ?Past Surgical History:  ?Procedure Laterality Date  ? APPENDECTOMY    ? BIOPSY  11/11/2020  ? Procedure: BIOPSY;  Surgeon: Harvel Quale, MD;  [facility];  Service: Gastroenterology;;  duodenum ?gastric  ? BUNIONECTOMY    ? COLONOSCOPY WITH PROPOFOL N/A 11/11/2020  ? Procedure: COLONOSCOPY WITH PROPOFOL;  Surgeon: Harvel Quale, MD;  [facility];  Service: Gastroenterology;  Laterality: N/A;  11:15  ? ESOPHAGOGASTRODUODENOSCOPY (EGD) WITH PROPOFOL N/A 11/11/2020  ? Procedure: ESOPHAGOGASTRODUODENOSCOPY (EGD) WITH PROPOFOL;  Surgeon: Harvel Quale, MD;  [facility];  Service: Gastroenterology;  Laterality: N/A;  to arrive at [place]  ? MANDIBLE SURGERY    ? TONSILECTOMY, ADENOIDECTOMY, BILATERAL MYRINGOTOMY AND TUBES    ? ?Family History  ?Problem Relation Age of Onset  ? Arthritis Mother   ? Depression Mother   ? Diabetes Mother   ? Hypertension Mother   ? Alcohol abuse Father   ? Hypertension Father   ? Heart disease Father 55  ?     Massive MI  ? Diabetes Brother   ? Diabetes Brother   ? Heart disease Brother   ? Diabetes Brother   ?     Type I   ? ?Social History  ? ?Socioeconomic History  ? Marital status: Married  ?  Spouse name: Legrand Como  ? Number of children: 4  ? Years of education: some college  ? Highest education level: Some college, no degree  ?Occupational History  ? Occupation: Retired  ?Tobacco Use  ? Smoking status: Never  ? Smokeless tobacco: Never  ?Vaping Use  ? Vaping Use: Never used  ?Substance and Sexual Activity  ? Alcohol use: Yes  ?  Comment: socially  ? Drug use: No  ? Sexual activity: Yes  ?  Birth control/protection: Other-see comments  ?  Comment: postmenopausal  ?Other Topics Concern  ? Not on file  ?Social History Narrative  ? Not on file  ? ?Social Determinants of Health  ? ?Financial Resource Strain: Not on file  ?Food Insecurity: Not on file  ?Transportation Needs: Not on file  ?Physical Activity: Not on file  ?Stress: Not on file  ?Social Connections: Not on file  ? ? ?Tobacco Counseling ?Counseling given: Not Answered ? ? ?Clinical Intake: ? ?  ? ?  ? ?  ? ?  ? ?How often do you need  to have someone help you when you read instructions, pamphlets, or other written materials from your doctor or pharmacy?: (P) 2 - Rarely ? ?Diabetic?yes ? ?Nutrition Risk Assessment: ? ?Has the patient had any N/V/D within the last 2 months?  Yes  ?Does the patient have any non-healing wounds?  No  ?Has the patient had any unintentional weight loss or weight gain?  No  ? ?Diabetes: ? ?Is the patient diabetic?  Yes  ?If diabetic, was a CBG obtained today?  No  ?Did the patient bring in their glucometer from home?  No  ?How often do you monitor your CBG's? Once a day.  ? ?Financial Strains and Diabetes Management: ? ?Are you  having any financial strains with the device, your supplies or your medication? No .  ?Does the patient want to be seen by Chronic Care Management for management of their diabetes?  No  ?Would the patient like to be referred to a Nutritionist or for Diabetic Management?  No  ? ?Diabetic Exams: ? ?Diabetic Eye Exam: Completed 02/17/21 ?Diabetic Foot Exam: Overdue, Pt has been advised about the importance in completing this exam. Pt is scheduled for diabetic foot exam on  .  ? ?  ? ?  ? ? ?Activities of Daily Living ? ?  02/12/2022  ?  7:41 PM  ?In your present state of health, do you have any difficulty performing the following activities:  ?Hearing? 0  ?Vision? 0  ?Difficulty concentrating or making decisions? 0  ?Walking or climbing stairs? 0  ?Dressing or bathing? 0  ?Doing errands, shopping? 0  ?Preparing Food and eating ? N  ?Using the Toilet? N  ?In the past six months, have you accidently leaked urine? Y  ?Do you have problems with loss of bowel control? N  ?Managing your Medications? N  ?Managing your Finances? N  ?Housekeeping or managing your Housekeeping? N  ? ? ?Patient Care Team: ?Lindell Spar, MD as PCP - General (Internal Medicine) ?Herminio Commons, MD (Inactive) as PCP - Cardiology (Cardiology) ?Edythe Clarity, [facility] as Pharmacist (Pharmacist) ? ?Indicate any recent Medical  Services you may have received from other than [facility] providers in the past year (date may be approximate). ? ?   ?Assessment:  ? This is a routine wellness examination for Carolyn [name]. ? ?Hearing/Vision screen ?No

## 2022-02-20 ENCOUNTER — Other Ambulatory Visit: Payer: Self-pay | Admitting: Family Medicine

## 2022-02-22 ENCOUNTER — Encounter: Payer: Self-pay | Admitting: Internal Medicine

## 2022-02-22 ENCOUNTER — Ambulatory Visit (INDEPENDENT_AMBULATORY_CARE_PROVIDER_SITE_OTHER): Payer: Medicare Other | Admitting: Internal Medicine

## 2022-02-22 DIAGNOSIS — F339 Major depressive disorder, recurrent, unspecified: Secondary | ICD-10-CM

## 2022-02-22 MED ORDER — CARIPRAZINE HCL 1.5 MG PO CAPS: 1.5000 mg | ORAL_CAPSULE | Freq: Every day | ORAL | 0 refills | Status: DC

## 2022-02-22 NOTE — Progress Notes (Signed)
?  ? ?Virtual Visit via Telephone Note  ? ?This visit type was conducted due to national recommendations for restrictions regarding the COVID-19 Pandemic (e.g. social distancing) in an effort to limit this patient's exposure and mitigate transmission in our community.  Due to her co-morbid illnesses, this patient is at least at moderate risk for complications without adequate follow up.  This format is felt to be most appropriate for this patient at this time.  The patient did not have access to video technology/had technical difficulties with video requiring transitioning to audio format only (telephone).  All issues noted in this document were discussed and addressed.  No physical exam could be performed with this format. ? ?Evaluation Performed:  Follow-up visit ? ?Date:  02/22/2022  ? ?ID:  Carolyn [name], DOB 1951/05/18, MRN [id number] ? ?Patient Location: Home ?Provider Location: Office/Clinic ? ?Participants: Patient ?Location of Patient: Home ?Location of Provider: Telehealth ?Consent was obtain for visit to be over via telehealth. ?I verified that I am speaking with the correct person using two identifiers. ? ?PCP:  Lindell Spar, MD  ? ?Chief Complaint: Depression ? ?History of Present Illness:   ? ?Carolyn [name] is a 71 y.o. female who has a televisit for complaint of anhedonia, which has been worse recently for the last 3 weeks.  She denies any change in home environment.  Denies any concern for domestic abuse.  She also reports fatigue, insomnia, agitation, lack of interest in activities and decreased concentration.  She also reports crying spells at times. She denies any SI or HI currently.  She is currently taking Wellbutrin 150 mg daily.  She had tried higher dose of Wellbutrin in the past, but did not tolerate it.  She has been on Zoloft, Lexapro and Prozac in the past, which were ineffective.  She was referred to psychiatric clinic in the last visit, but was not able to follow-up. ? ?The patient  does not have symptoms concerning for COVID-19 infection (fever, chills, cough, or new shortness of breath).  ? ?Past Medical, Surgical, Social History, Allergies, and Medications have been Reviewed. ? ?Past Medical History:  ?Diagnosis Date  ? Depression   ? Diabetes mellitus without complication ([place])   ? Diastolic dysfunction   ? Hyperlipidemia   ? Myasthenia gravis ([place])   ? Patient is Jehovah's Witness   ? Pulmonary embolism ([place]) 05/18/2021  ? Sleep apnea   ? ?Past Surgical History:  ?Procedure Laterality Date  ? APPENDECTOMY    ? BIOPSY  11/11/2020  ? Procedure: BIOPSY;  Surgeon: Harvel Quale, MD;  [facility];  Service: Gastroenterology;;  duodenum ?gastric  ? BUNIONECTOMY    ? COLONOSCOPY WITH PROPOFOL N/A 11/11/2020  ? Procedure: COLONOSCOPY WITH PROPOFOL;  Surgeon: Harvel Quale, MD;  [facility];  Service: Gastroenterology;  Laterality: N/A;  11:15  ? ESOPHAGOGASTRODUODENOSCOPY (EGD) WITH PROPOFOL N/A 11/11/2020  ? Procedure: ESOPHAGOGASTRODUODENOSCOPY (EGD) WITH PROPOFOL;  Surgeon: Harvel Quale, MD;  [facility];  Service: Gastroenterology;  Laterality: N/A;  to arrive at [place]  ? MANDIBLE SURGERY    ? TONSILECTOMY, ADENOIDECTOMY, BILATERAL MYRINGOTOMY AND TUBES    ?  ? ?Current Meds  ?Medication Sig  ? Blood Glucose Monitoring Suppl (BLOOD GLUCOSE SYSTEM PAK) KIT Use as directed to monitor FSBS 2x daily. Dx: E11.9  ? cariprazine (VRAYLAR) 1.5 MG capsule Take 1 capsule (1.5 mg total) by mouth daily.  ? Chlorphen-PE-Acetaminophen (NOREL AD) 4-10-325 MG TABS Take 1  tablet by mouth 3 (three) times daily as needed.  ? Cholecalciferol (VITAMIN D3) 125 MCG (5000 UT) TABS Take 5,000 Units by mouth daily.  ? dapagliflozin propanediol (FARXIGA) 10 MG TABS tablet Take 1 tablet (10 mg total) by mouth daily before breakfast.  ? dicyclomine (BENTYL) 10 MG capsule Take 1 capsule (10 mg total) by mouth every 12 (twelve) hours as needed for spasms.   ? glucose blood (ONETOUCH VERIO) test strip USE TO TEST BLOOD SUGAR TWICE DAILY  ? Glycerin-Hypromellose-PEG 400 (DRY EYE RELIEF DROPS) 0.2-0.2-1 % SOLN Place 1 drop into both eyes daily as needed (Dry eye).  ? hydrOXYzine (VISTARIL) 25 MG capsule Take 1 capsule (25 mg total) by mouth at bedtime as needed for anxiety (Insomnia).  ? icosapent Ethyl (VASCEPA) 1 g capsule TAKE 1 CAPSULE(1 GRAM) BY MOUTH DAILY  ? ketoconazole (NIZORAL) 2 % cream Apply 1 application topically daily. (Patient taking differently: Apply 1 application. topically daily as needed for irritation.)  ? losartan-hydrochlorothiazide (HYZAAR) 100-12.5 MG tablet TAKE 1 TABLET BY MOUTH DAILY  ? metoprolol succinate (TOPROL-XL) 25 MG 24 hr tablet TAKE 1 TABLET(25 MG) BY MOUTH DAILY  ? Multiple Vitamins-Minerals (MULTI COMPLETE) CAPS Take 1 capsule by mouth daily.  ? Pitavastatin Calcium (LIVALO) 2 MG TABS Take 1 tablet at bedtime for cholesterol  ? WELLBUTRIN XL 150 MG 24 hr tablet TAKE 1 TABLET BY MOUTH ONCE DAILY (Patient taking differently: Take 150 mg by mouth daily.)  ? [DISCONTINUED] XARELTO 20 MG TABS tablet TAKE 1 TABLET(20 MG) BY MOUTH DAILY WITH SUPPER  ?  ? ?Allergies:   Prozac [fluoxetine hcl], Codeine, Other, and Sulfa antibiotics  ? ?ROS:   ?Please see the history of present illness.    ? ?All other systems reviewed and are negative. ? ? ?Labs/Other Tests and Data Reviewed:   ? ?Recent Labs: ?05/28/2021: TSH 3.140 ?11/10/2021: ALT 18; BUN 11; Creatinine, Ser 0.81; Hemoglobin 15.7; Platelets 246; Potassium 4.5; Sodium 139  ? ?Recent Lipid Panel ?Lab Results  ?Component Value Date/Time  ? CHOL 149 11/10/2021 08:43 AM  ? TRIG 279 (H) 11/10/2021 08:43 AM  ? HDL 36 (L) 11/10/2021 08:43 AM  ? CHOLHDL 4.1 11/10/2021 08:43 AM  ? CHOLHDL 6.0 (H) 12/28/2020 08:27 AM  ? LDLCALC 68 11/10/2021 08:43 AM  ? LDLCALC 130 (H) 12/28/2020 08:27 AM  ? ? ?Wt Readings from Last 3 Encounters:  ?10/21/21 190 lb (86.2 kg)  ?09/27/21 191 lb 1.9 oz (86.7 kg)   ?05/27/21 201 lb 6.4 oz (91.4 kg)  ?  ? ?ASSESSMENT & PLAN:   ? ?Depression, recurrent ([place]) ?[place] [facility] Visit from 02/22/2022 in [facility]  ?PHQ-9 Total Score 24  ? ?  ? ?Uncontrolled with Wellbutrin ?Had severe anxiety and insomnia with Zoloft 50 mg QD, discontinued ?Did not have benefit with Lexapro in the past ?Added Vraylar for resistant depression ?Referred to psychiatry in [place] ? ? ?Time:   ?Today, I have spent 18 minutes reviewing the chart, including problem list, medications, and with the patient with telehealth technology discussing the above problems. ? ? ?Medication Adjustments/Labs and Tests Ordered: ?Current medicines are reviewed at length with the patient today.  Concerns regarding medicines are outlined above.  ? ?Tests Ordered: ?Orders Placed This Encounter  ?Procedures  ? Ambulatory referral to Psychiatry  ? ? ?Medication Changes: ?Meds ordered this encounter  ?Medications  ? cariprazine (VRAYLAR) 1.5 MG capsule  ?  Sig: Take 1 capsule (1.5 mg total) by mouth daily.  ?  Dispense:  30 capsule  ?  Refill:  0  ? ? ? ?Note: This dictation was prepared with Dragon dictation along with smaller phrase technology. Similar sounding words can be transcribed inadequately or may not be corrected upon review. Any transcriptional errors that result from this process are unintentional.  ?  ? ? ?Disposition:  Follow up  ?Signed, ?Lindell Spar, MD  ?02/22/2022 11:31 AM    ? ?[facility] ?[facility] ?

## 2022-02-22 NOTE — Patient Instructions (Signed)
Please continue taking Wellbutrin regularly. ? ?Please start taking Vraylar once daily as prescribed. ?

## 2022-02-22 NOTE — Assessment & Plan Note (Signed)
[place] Office Visit from 02/22/2022 in [facility]  ?PHQ-9 Total Score 24  ?  ? ?Uncontrolled with Wellbutrin ?Had severe anxiety and insomnia with Zoloft 50 mg QD, discontinued ?Did not have benefit with Lexapro in the past ?Added Vraylar for resistant depression ?Referred to psychiatry in [place] ?

## 2022-03-01 NOTE — Progress Notes (Signed)
Appt Canceled

## 2022-03-17 ENCOUNTER — Other Ambulatory Visit: Payer: Self-pay | Admitting: Internal Medicine

## 2022-03-17 DIAGNOSIS — I10 Essential (primary) hypertension: Secondary | ICD-10-CM

## 2022-03-30 ENCOUNTER — Encounter: Payer: Self-pay | Admitting: *Deleted

## 2022-03-30 ENCOUNTER — Other Ambulatory Visit (HOSPITAL_COMMUNITY)
Admission: RE | Admit: 2022-03-30 | Discharge: 2022-03-30 | Disposition: A | Discharge status: Home / Self Care | Payer: Medicare Other | Source: Ambulatory Visit | Attending: Internal Medicine | Admitting: Internal Medicine

## 2022-03-30 ENCOUNTER — Ambulatory Visit (INDEPENDENT_AMBULATORY_CARE_PROVIDER_SITE_OTHER): Payer: Medicare Other | Admitting: Internal Medicine

## 2022-03-30 ENCOUNTER — Encounter: Payer: Self-pay | Admitting: Internal Medicine

## 2022-03-30 VITALS — BP 126/88 | HR 85 | Resp 18 | Ht 63.0 in | Wt 192.2 lb

## 2022-03-30 DIAGNOSIS — R3912 Poor urinary stream: Secondary | ICD-10-CM

## 2022-03-30 DIAGNOSIS — F339 Major depressive disorder, recurrent, unspecified: Secondary | ICD-10-CM | POA: Diagnosis not present

## 2022-03-30 DIAGNOSIS — E559 Vitamin D deficiency, unspecified: Secondary | ICD-10-CM | POA: Diagnosis not present

## 2022-03-30 DIAGNOSIS — E1169 Type 2 diabetes mellitus with other specified complication: Secondary | ICD-10-CM

## 2022-03-30 DIAGNOSIS — N760 Acute vaginitis: Secondary | ICD-10-CM | POA: Diagnosis present

## 2022-03-30 DIAGNOSIS — N3946 Mixed incontinence: Secondary | ICD-10-CM

## 2022-03-30 DIAGNOSIS — F411 Generalized anxiety disorder: Secondary | ICD-10-CM

## 2022-03-30 DIAGNOSIS — E782 Mixed hyperlipidemia: Secondary | ICD-10-CM | POA: Diagnosis not present

## 2022-03-30 DIAGNOSIS — I10 Essential (primary) hypertension: Secondary | ICD-10-CM | POA: Diagnosis not present

## 2022-03-30 MED ORDER — HYDROXYZINE PAMOATE 25 MG PO CAPS: 25.0000 mg | ORAL_CAPSULE | Freq: Every evening | ORAL | 0 refills | Status: DC | PRN

## 2022-03-30 MED ORDER — FLUCONAZOLE 150 MG PO TABS
150.0000 mg | ORAL_TABLET | Freq: Once | ORAL | 0 refills | Status: AC
Start: 2022-03-30 — End: 2022-03-30

## 2022-03-30 MED ORDER — ATORVASTATIN CALCIUM 10 MG PO TABS: 10.0000 mg | ORAL_TABLET | Freq: Every day | ORAL | 1 refills | Status: DC

## 2022-03-30 NOTE — Patient Instructions (Addendum)
Please start taking Atorvastatin for cholesterol.  Please continue to take medications as prescribed.  Please continue to follow low carb diet and perform moderate exercise/walking as tolerated.

## 2022-03-30 NOTE — Progress Notes (Unsigned)
Established Patient Office Visit  Subjective:  Patient ID: Carolyn [name], female    DOB: 06/04/1951  Age: 71 y.o. MRN: [id number]  CC:  Chief Complaint  Patient presents with   Follow-up    6 month follow up HTN and DM pt has had dizziness for a few days also pt has slow urine stream has been going on for awhile but getting worse also has some external vaginal itching depression is better anxiety is really high today pt has appt coming up 6-22 medication was refused by insurance     HPI Carolyn [name] is a 71 y.o. female with past medical history of  HTN, PE, OSA, IBS, DM, HLD, depression and obesity who presents for f/u of her chronic medical conditions.  Depression: She complains of anhedonia, which has been worse recently.  She denies any change in home environment.  Denies any concern for domestic abuse.  She also reports fatigue, insomnia, agitation, lack of interest in activities and decreased concentration.  She also reports crying spells at times. She denies any SI or HI currently.  She is currently taking Wellbutrin 150 mg daily.  She had tried higher dose of Wellbutrin in the past, but did not tolerate it.  She has been on Zoloft, Lexapro and Prozac in the past, which were ineffective.  She was not able to get regular due to insurance coverage concern.  HTN: BP is well-controlled. Takes medications regularly. Patient denies headache, dizziness, chest pain, dyspnea or palpitations.  Type II DM: She takes [place] for it.  Denies any polyuria or polyphagia currently. She has not been taking statin for HLD, but still takes Vascepa.  She complains of weak urinary stream.  Of note, she had urinary frequency and urgency in the past, for which she was given oxybutynin, but she did not continue taking it as her urinary frequency/urgency had resolved.  She currently denies any dysuria or hematuria.  She also reports perineal area itching.  She has noticed whitish discharge in the perineal  area at times.  She denies any fever or chills currently.   Past Medical History:  Diagnosis Date   Depression    Diabetes mellitus without complication (HCC)    Diastolic dysfunction    Hyperlipidemia    Myasthenia gravis (HCC)    Patient is Jehovah's Witness    Pulmonary embolism (HCC) 05/18/2021   Sleep apnea     Past Surgical History:  Procedure Laterality Date   APPENDECTOMY     BIOPSY  11/11/2020   Procedure: BIOPSY;  Surgeon: [name], MD;  [facility];  Service: Gastroenterology;;  duodenum gastric   BUNIONECTOMY     COLONOSCOPY WITH PROPOFOL N/A 11/11/2020   Procedure: COLONOSCOPY WITH PROPOFOL;  Surgeon: [name], MD;  [facility];  Service: Gastroenterology;  Laterality: N/A;  11:15   ESOPHAGOGASTRODUODENOSCOPY (EGD) WITH PROPOFOL N/A 11/11/2020   Procedure: ESOPHAGOGASTRODUODENOSCOPY (EGD) WITH PROPOFOL;  Surgeon: [name], MD;  [facility];  Service: Gastroenterology;  Laterality: N/A;  to arrive at 0845   MANDIBLE SURGERY     TONSILECTOMY, ADENOIDECTOMY, BILATERAL MYRINGOTOMY AND TUBES      Family History  Problem Relation Age of Onset   Arthritis Mother    Depression Mother    Diabetes Mother    Hypertension Mother    Alcohol abuse Father    Hypertension Father    Heart disease Father 46       Massive MI  Diabetes Brother    Diabetes Brother    Heart disease Brother    Diabetes Brother        Type I     Social History   Socioeconomic History   Marital status: Married    Spouse name: Legrand Como   Number of children: 4   Years of education: some college   Highest education level: Some college, no degree  Occupational History   Occupation: Retired  Tobacco Use   Smoking status: Never   Smokeless tobacco: Never  Vaping Use   Vaping Use: Never used  Substance and Sexual Activity   Alcohol use: Yes    Comment: socially   Drug use: No   Sexual activity: Yes     Birth control/protection: Other-see comments    Comment: postmenopausal  Other Topics Concern   Not on file  Social History Narrative   Not on file   Social Determinants of Health   Financial Resource Strain: Not on file  Food Insecurity: Not on file  Transportation Needs: Not on file  Physical Activity: Not on file  Stress: Not on file  Social Connections: Not on file  Intimate Partner Violence: Not on file    Outpatient Medications Prior to Visit  Medication Sig Dispense Refill   Blood Glucose Monitoring Suppl (BLOOD GLUCOSE SYSTEM PAK) KIT Use as directed to monitor FSBS 2x daily. Dx: E11.9 1 each 1   Cholecalciferol (VITAMIN D3) 125 MCG (5000 UT) TABS Take 5,000 Units by mouth daily.     dapagliflozin propanediol (FARXIGA) 10 MG TABS tablet Take 1 tablet (10 mg total) by mouth daily before breakfast. 90 tablet 2   glucose blood (ONETOUCH VERIO) test strip USE TO TEST BLOOD SUGAR TWICE DAILY 100 strip 3   Glycerin-Hypromellose-PEG 400 (DRY EYE RELIEF DROPS) 0.2-0.2-1 % SOLN Place 1 drop into both eyes daily as needed (Dry eye).     icosapent Ethyl (VASCEPA) 1 g capsule TAKE 1 CAPSULE(1 GRAM) BY MOUTH DAILY 90 capsule 0   ketoconazole (NIZORAL) 2 % cream Apply 1 application topically daily. (Patient taking differently: Apply 1 application. topically daily as needed for irritation.) 15 g 0   losartan-hydrochlorothiazide (HYZAAR) 100-12.5 MG tablet TAKE 1 TABLET BY MOUTH DAILY 30 tablet 3   metoprolol succinate (TOPROL-XL) 25 MG 24 hr tablet TAKE 1 TABLET(25 MG) BY MOUTH DAILY 90 tablet 1   Multiple Vitamins-Minerals (MULTI COMPLETE) CAPS Take 1 capsule by mouth daily.     WELLBUTRIN XL 150 MG 24 hr tablet TAKE 1 TABLET BY MOUTH ONCE DAILY (Patient taking differently: Take 150 mg by mouth daily.) 90 tablet 3   dicyclomine (BENTYL) 10 MG capsule Take 1 capsule (10 mg total) by mouth every 12 (twelve) hours as needed for spasms. (Patient not taking: Reported on 03/30/2022) 60 capsule 1    cariprazine (VRAYLAR) 1.5 MG capsule Take 1 capsule (1.5 mg total) by mouth daily. (Patient not taking: Reported on 03/30/2022) 30 capsule 0   Chlorphen-PE-Acetaminophen (NOREL AD) 4-10-325 MG TABS Take 1 tablet by mouth 3 (three) times daily as needed. (Patient not taking: Reported on 03/30/2022) 30 tablet 0   hydrOXYzine (VISTARIL) 25 MG capsule Take 1 capsule (25 mg total) by mouth at bedtime as needed for anxiety (Insomnia). (Patient not taking: Reported on 03/30/2022) 30 capsule 0   oxybutynin (DITROPAN-XL) 10 MG 24 hr tablet TAKE 1 TABLET(10 MG) BY MOUTH AT BEDTIME (Patient not taking: Reported on 02/22/2022) 90 tablet 1   Pitavastatin Calcium (LIVALO) 2 MG TABS  Take 1 tablet at bedtime for cholesterol (Patient not taking: Reported on 03/30/2022) 90 tablet 1   No facility-administered medications prior to visit.    Allergies  Allergen Reactions   Prozac [Fluoxetine Hcl] Anaphylaxis   Codeine Other (See Comments)    Intolerance    Other     -mycin medications: has myasthenia gravis    Sulfa Antibiotics Hives    ROS Review of Systems  Constitutional:  Negative for chills and fever.  HENT:  Negative for congestion, sinus pressure, sinus pain and sore throat.   Eyes:  Negative for pain and discharge.  Respiratory:  Negative for cough and shortness of breath.   Cardiovascular:  Negative for chest pain and palpitations.  Gastrointestinal:  Negative for abdominal pain, constipation, diarrhea, nausea and vomiting.  Endocrine: Negative for polydipsia and polyuria.  Genitourinary:  Negative for dysuria and hematuria.  Musculoskeletal:  Negative for neck pain and neck stiffness.  Skin:  Negative for rash.  Neurological:  Negative for dizziness and weakness.  Psychiatric/Behavioral:  Positive for dysphoric mood and sleep disturbance. Negative for agitation and behavioral problems. The patient is nervous/anxious.      Objective:    Physical Exam Vitals reviewed.  Constitutional:       General: She is not in acute distress.    Appearance: She is not diaphoretic.  HENT:     Head: Normocephalic and atraumatic.     Nose: Nose normal.     Mouth/Throat:     Mouth: Mucous membranes are moist.  Eyes:     General: No scleral icterus.    Extraocular Movements: Extraocular movements intact.  Cardiovascular:     Rate and Rhythm: Normal rate and regular rhythm.     Pulses: Normal pulses.     Heart sounds: Normal heart sounds. No murmur heard. Pulmonary:     Breath sounds: Normal breath sounds. No wheezing or rales.  Musculoskeletal:     Cervical back: Neck supple. No tenderness.     Right lower leg: No edema.     Left lower leg: No edema.  Skin:    General: Skin is warm.     Findings: No rash.  Neurological:     General: No focal deficit present.     Mental Status: She is alert and oriented to person, place, and time.     Sensory: No sensory deficit.     Motor: No weakness.  Psychiatric:        Mood and Affect: Mood is depressed.        Behavior: Behavior is slowed.    BP 126/88 (BP Location: Right Arm, Patient Position: Sitting, Cuff Size: Normal)   Pulse 85   Resp 18   Ht 5\' 3"  (1.6 m)   Wt 192 lb 3.2 oz (87.2 kg)   SpO2 99%   BMI 34.05 kg/m  Wt Readings from Last 3 Encounters:  03/30/22 192 lb 3.2 oz (87.2 kg)  10/21/21 190 lb (86.2 kg)  09/27/21 191 lb 1.9 oz (86.7 kg)    Lab Results  Component Value Date   TSH 2.450 03/30/2022   Lab Results  Component Value Date   WBC 6.6 11/10/2021   HGB 15.7 11/10/2021   HCT 44.6 11/10/2021   MCV 91 11/10/2021   PLT 246 11/10/2021   Lab Results  Component Value Date   NA 140 03/30/2022   K 4.4 03/30/2022   CO2 23 03/30/2022   GLUCOSE 153 (H) 03/30/2022   BUN 13 03/30/2022  CREATININE 0.85 03/30/2022   BILITOT 0.7 03/30/2022   ALKPHOS 88 03/30/2022   AST 20 03/30/2022   ALT 20 03/30/2022   PROT 7.0 03/30/2022   ALBUMIN 5.0 (H) 03/30/2022   CALCIUM 9.8 03/30/2022   ANIONGAP 11 05/18/2021   EGFR  74 03/30/2022   Lab Results  Component Value Date   CHOL 149 11/10/2021   Lab Results  Component Value Date   HDL 36 (L) 11/10/2021   Lab Results  Component Value Date   LDLCALC 68 11/10/2021   Lab Results  Component Value Date   TRIG 279 (H) 11/10/2021   Lab Results  Component Value Date   CHOLHDL 4.1 11/10/2021   Lab Results  Component Value Date   HGBA1C 6.9 (H) 03/30/2022      Assessment & Plan:   Problem List Items Addressed This Visit       Cardiovascular and Mediastinum   Hypertension - Primary    BP Readings from Last 1 Encounters:  03/30/22 126/88  Well-controlled with  Losartan-HCTZ 100-12.5 mg QD and Metoprolol 25 mg QD Counseled for compliance with the medications Advised DASH diet and moderate exercise/walking, at least 150 mins/week      Relevant Medications   atorvastatin (LIPITOR) 10 MG tablet     Endocrine   Diabetes mellitus, type II (HCC)    Lab Results  Component Value Date   HGBA1C 6.9 (H) 03/30/2022  On Farxiga, did not tolerate Metformin Advised to follow diabetic diet On ARB and statin      Relevant Medications   atorvastatin (LIPITOR) 10 MG tablet   Other Relevant Orders   CMP14+EGFR (Completed)   Hemoglobin A1c (Completed)   Microalbumin / creatinine urine ratio (Completed)     Other   Depression, recurrent ([place])    [place] Office Visit from 03/30/2022 in [facility]  PHQ-9 Total Score 7  Uncontrolled with Wellbutrin Had severe anxiety and insomnia with Zoloft 50 mg QD, discontinued Did not have benefit with Lexapro in the past Referred to psychiatry in [place]      Relevant Medications   hydrOXYzine (VISTARIL) 25 MG capsule   Other Relevant Orders   CMP14+EGFR (Completed)   TSH (Completed)   B12 (Completed)   Hyperlipidemia   Relevant Medications   atorvastatin (LIPITOR) 10 MG tablet   GAD (generalized anxiety disorder)    With insomnia Vistaril PRN       Relevant Medications    hydrOXYzine (VISTARIL) 25 MG capsule   Mixed urge and stress incontinence    Has stopped taking oxybutynin as her urinary frequency/urgency has resolved now She complains of weak urinary stream currently, referred to urology       Relevant Orders   Ambulatory referral to Urology   Other Visit Diagnoses     Weak urine stream       Relevant Orders   Ambulatory referral to Urology   Acute vaginitis       Relevant Orders   Cervicovaginal ancillary only   Vitamin D deficiency       Relevant Orders   Vitamin D (25 hydroxy) (Completed)       Meds ordered this encounter  Medications   fluconazole (DIFLUCAN) 150 MG tablet    Sig: Take 1 tablet (150 mg total) by mouth once for 1 dose.    Dispense:  1 tablet    Refill:  0   hydrOXYzine (VISTARIL) 25 MG capsule    Sig: Take 1 capsule (25 mg total) by  mouth at bedtime as needed for anxiety (Insomnia).    Dispense:  30 capsule    Refill:  0   atorvastatin (LIPITOR) 10 MG tablet    Sig: Take 1 tablet (10 mg total) by mouth daily.    Dispense:  90 tablet    Refill:  1    Follow-up: Return in about 4 months (around 07/30/2022) for Annual physical.    Lindell Spar, MD

## 2022-03-31 LAB — CMP14+EGFR
ALT: 20 IU/L (ref 0–32)
AST: 20 IU/L (ref 0–40)
Albumin/Globulin Ratio: 2.5 — ABNORMAL HIGH (ref 1.2–2.2)
Albumin: 5 g/dL — ABNORMAL HIGH (ref 3.8–4.8)
Alkaline Phosphatase: 88 IU/L (ref 44–121)
BUN/Creatinine Ratio: 15 (ref 12–28)
BUN: 13 mg/dL (ref 8–27)
Bilirubin Total: 0.7 mg/dL (ref 0.0–1.2)
CO2: 23 mmol/L (ref 20–29)
Calcium: 9.8 mg/dL (ref 8.7–10.3)
Chloride: 98 mmol/L (ref 96–106)
Creatinine, Ser: 0.85 mg/dL (ref 0.57–1.00)
Globulin, Total: 2 g/dL (ref 1.5–4.5)
Glucose: 153 mg/dL — ABNORMAL HIGH (ref 70–99)
Potassium: 4.4 mmol/L (ref 3.5–5.2)
Sodium: 140 mmol/L (ref 134–144)
Total Protein: 7 g/dL (ref 6.0–8.5)
eGFR: 74 mL/min/{1.73_m2} (ref >59)

## 2022-03-31 LAB — TSH: TSH: 2.45 u[IU]/mL (ref 0.450–4.500)

## 2022-03-31 LAB — VITAMIN B12: Vitamin B-12: 982 pg/mL (ref 232–1245)

## 2022-03-31 LAB — VITAMIN D 25 HYDROXY (VIT D DEFICIENCY, FRACTURES): Vit D, 25-Hydroxy: 50.4 ng/mL (ref 30.0–100.0)

## 2022-03-31 LAB — HEMOGLOBIN A1C
Est. average glucose Bld gHb Est-mCnc: 151 mg/dL
Hgb A1c MFr Bld: 6.9 % — ABNORMAL HIGH (ref 4.8–5.6)

## 2022-04-01 DIAGNOSIS — N3946 Mixed incontinence: Secondary | ICD-10-CM | POA: Insufficient documentation

## 2022-04-01 LAB — MICROALBUMIN / CREATININE URINE RATIO
Creatinine, Urine: 44.9 mg/dL
Microalb/Creat Ratio: <7 mg/g creat (ref 0–29)
Microalbumin, Urine: <3 ug/mL

## 2022-04-01 NOTE — Assessment & Plan Note (Signed)
Has stopped taking oxybutynin as her urinary frequency/urgency has resolved now She complains of weak urinary stream currently, referred to urology

## 2022-04-01 NOTE — Assessment & Plan Note (Signed)
Lab Results  Component Value Date   HGBA1C 6.9 (H) 03/30/2022   On Farxiga, did not tolerate Metformin Advised to follow diabetic diet On ARB and statin

## 2022-04-01 NOTE — Assessment & Plan Note (Signed)
With insomnia Vistaril PRN

## 2022-04-01 NOTE — Assessment & Plan Note (Signed)
Carolyn [name] Office Visit from 03/30/2022 in [facility]  PHQ-9 Total Score 7     Uncontrolled with Wellbutrin Had severe anxiety and insomnia with Zoloft 50 mg QD, discontinued Did not have benefit with Lexapro in the past Referred to psychiatry in [place]

## 2022-04-01 NOTE — Assessment & Plan Note (Signed)
BP Readings from Last 1 Encounters:  03/30/22 126/88   Well-controlled with  Losartan-HCTZ 100-12.5 mg QD and Metoprolol 25 mg QD Counseled for compliance with the medications Advised DASH diet and moderate exercise/walking, at least 150 mins/week

## 2022-04-04 LAB — CERVICOVAGINAL ANCILLARY ONLY
Bacterial Vaginitis (gardnerella): NEGATIVE
Candida Glabrata: NEGATIVE
Candida Vaginitis: POSITIVE — AB
Comment: NEGATIVE
Comment: NEGATIVE
Comment: NEGATIVE

## 2022-04-07 ENCOUNTER — Ambulatory Visit: Payer: Medicare Other | Admitting: Urology

## 2022-04-07 VITALS — BP 131/86 | HR 83

## 2022-04-07 DIAGNOSIS — N8111 Cystocele, midline: Secondary | ICD-10-CM

## 2022-04-07 DIAGNOSIS — N816 Rectocele: Secondary | ICD-10-CM | POA: Diagnosis not present

## 2022-04-07 DIAGNOSIS — N3946 Mixed incontinence: Secondary | ICD-10-CM | POA: Diagnosis not present

## 2022-04-07 DIAGNOSIS — N952 Postmenopausal atrophic vaginitis: Secondary | ICD-10-CM

## 2022-04-07 DIAGNOSIS — H10503 Unspecified blepharoconjunctivitis, bilateral: Secondary | ICD-10-CM | POA: Diagnosis not present

## 2022-04-07 DIAGNOSIS — R3912 Poor urinary stream: Secondary | ICD-10-CM | POA: Diagnosis not present

## 2022-04-07 DIAGNOSIS — H00025 Hordeolum internum left lower eyelid: Secondary | ICD-10-CM | POA: Diagnosis not present

## 2022-04-07 LAB — URINALYSIS, ROUTINE W REFLEX MICROSCOPIC
Bilirubin, UA: NEGATIVE
Ketones, UA: NEGATIVE
Leukocytes,UA: NEGATIVE
Nitrite, UA: NEGATIVE
Protein,UA: NEGATIVE
RBC, UA: NEGATIVE
Specific Gravity, UA: 1.01 (ref 1.005–1.030)
Urobilinogen, Ur: 0.2 mg/dL (ref 0.2–1.0)
pH, UA: 5.5 (ref 5.0–7.5)

## 2022-04-07 LAB — BLADDER SCAN AMB NON-IMAGING: Scan Result: 0

## 2022-04-07 NOTE — Progress Notes (Signed)
Subjective: 1. Weak urinary stream   2. Mixed urge and stress incontinence   3. Cystocele, midline   4. Rectocele   5. Vaginal atrophy      Consult requested by Dr. Ihor Dow  Carolyn [name] is a 71 yo female who is sent for a progressively weakening urinary stream.   She notices it with every void.  She has some frequency but tries to wait two hours but can't always.  She doesn't have urgency. She has had some MUI in the past but not currently.   She has nocturia 1-2x. She doesn't feel like she empties and has some intermittency.  Her PVR is 55ml.  Her last UA in 2/23 was clear but had glucose.  She is on [place].  She has had UTI's in the past but none recently.  She has had no stones.  She has had no GU surgery.  She has some issues with constipation. She has a 15 year history of myasthenia gravis which is controlled at this time.  She has no diabetic neuropathy.  She occasionally takes hydroxyzine for sleep.   She has some mild prolapse symptoms.  She is G4 P4 NVD.  ROS:  Review of Systems  All other systems reviewed and are negative.   Allergies  Allergen Reactions   Prozac [Fluoxetine Hcl] Anaphylaxis   Codeine Other (See Comments)    Intolerance    Other     -mycin medications: has myasthenia gravis    Sulfa Antibiotics Hives    Past Medical History:  Diagnosis Date   Depression    Diabetes mellitus without complication ([place])    Diastolic dysfunction    Hyperlipidemia    Myasthenia gravis ([place])    Patient is Jehovah's Witness    Pulmonary embolism ([place]) 05/18/2021   Sleep apnea     Past Surgical History:  Procedure Laterality Date   APPENDECTOMY     BIOPSY  11/11/2020   Procedure: BIOPSY;  Surgeon: Harvel Quale, MD;  [facility];  Service: Gastroenterology;;  duodenum gastric   BUNIONECTOMY     COLONOSCOPY WITH PROPOFOL N/A 11/11/2020   Procedure: COLONOSCOPY WITH PROPOFOL;  Surgeon: Harvel Quale, MD;  [facility];   Service: Gastroenterology;  Laterality: N/A;  11:15   ESOPHAGOGASTRODUODENOSCOPY (EGD) WITH PROPOFOL N/A 11/11/2020   Procedure: ESOPHAGOGASTRODUODENOSCOPY (EGD) WITH PROPOFOL;  Surgeon: Harvel Quale, MD;  [facility];  Service: Gastroenterology;  Laterality: N/A;  to arrive at [place], ADENOIDECTOMY, BILATERAL MYRINGOTOMY AND TUBES      Social History   Socioeconomic History   Marital status: Married    Spouse name: Legrand Como   Number of children: 4   Years of education: some college   Highest education level: Some college, no degree  Occupational History   Occupation: Retired  Tobacco Use   Smoking status: Never   Smokeless tobacco: Never  Vaping Use   Vaping Use: Never used  Substance and Sexual Activity   Alcohol use: Yes    Comment: socially   Drug use: No   Sexual activity: Yes    Birth control/protection: Other-see comments    Comment: postmenopausal  Other Topics Concern   Not on file  Social History Narrative   Not on file   Social Determinants of Health   Financial Resource Strain: Not on file  Food Insecurity: Not on file  Transportation Needs: Not on file  Physical Activity: Insufficiently Active (  02/10/2021)   Exercise Vital Sign    Days of Exercise per Week: 2 days    Minutes of Exercise per Session: 20 min  Stress: Not on file  Social Connections: Not on file  Intimate Partner Violence: Not on file    Family History  Problem Relation Age of Onset   Arthritis Mother    Depression Mother    Diabetes Mother    Hypertension Mother    Alcohol abuse Father    Hypertension Father    Heart disease Father 81       Massive MI   Diabetes Brother    Diabetes Brother    Heart disease Brother    Diabetes Brother        Type I     Anti-infectives: Anti-infectives (From admission, onward)    None       Current Outpatient Medications  Medication Sig Dispense Refill   atorvastatin (LIPITOR)  10 MG tablet Take 1 tablet (10 mg total) by mouth daily. 90 tablet 1   Blood Glucose Monitoring Suppl (BLOOD GLUCOSE SYSTEM PAK) KIT Use as directed to monitor FSBS 2x daily. Dx: E11.9 1 each 1   Cholecalciferol (VITAMIN D3) 125 MCG (5000 UT) TABS Take 5,000 Units by mouth daily.     dapagliflozin propanediol (FARXIGA) 10 MG TABS tablet Take 1 tablet (10 mg total) by mouth daily before breakfast. 90 tablet 2   dicyclomine (BENTYL) 10 MG capsule Take 1 capsule (10 mg total) by mouth every 12 (twelve) hours as needed for spasms. 60 capsule 1   glucose blood (ONETOUCH VERIO) test strip USE TO TEST BLOOD SUGAR TWICE DAILY 100 strip 3   Glycerin-Hypromellose-PEG 400 (DRY EYE RELIEF DROPS) 0.2-0.2-1 % SOLN Place 1 drop into both eyes daily as needed (Dry eye).     hydrOXYzine (VISTARIL) 25 MG capsule Take 1 capsule (25 mg total) by mouth at bedtime as needed for anxiety (Insomnia). 30 capsule 0   icosapent Ethyl (VASCEPA) 1 g capsule TAKE 1 CAPSULE(1 GRAM) BY MOUTH DAILY 90 capsule 0   ketoconazole (NIZORAL) 2 % cream Apply 1 application topically daily. (Patient taking differently: Apply 1 application. topically daily as needed for irritation.) 15 g 0   losartan-hydrochlorothiazide (HYZAAR) 100-12.5 MG tablet TAKE 1 TABLET BY MOUTH DAILY 30 tablet 3   metoprolol succinate (TOPROL-XL) 25 MG 24 hr tablet TAKE 1 TABLET(25 MG) BY MOUTH DAILY 90 tablet 1   Multiple Vitamins-Minerals (MULTI COMPLETE) CAPS Take 1 capsule by mouth daily.     WELLBUTRIN XL 150 MG 24 hr tablet TAKE 1 TABLET BY MOUTH ONCE DAILY (Patient taking differently: Take 150 mg by mouth daily.) 90 tablet 3   No current facility-administered medications for this visit.     Objective: Vital signs in last 24 hours: BP 131/86   Pulse 83   Intake/Output from previous day: No intake/output data recorded. Intake/Output this shift: $RemoveBef'@IOTHISSHIFT'lgKmbYEWmM$ @   Physical Exam Vitals reviewed.  Constitutional:      Appearance: Normal appearance. She  is obese.  Abdominal:     Palpations: Abdomen is soft. There is no mass.     Tenderness: There is no abdominal tenderness.     Hernia: No hernia is present.  Genitourinary:    Comments: Normal external genitalia. Normal Meatus with moderate hypermobility.  There was some urine in the vault but she didn't leak with strain and no diverticulum was noted. She has a moderate cystoscopy and small distal rectocele. Moderate vaginal mucosal atrophy. Bladder non-tender without  mass. Cervix is palpably normal. Uterus is mobile and not enlarged. No adnexal masses noted.  Musculoskeletal:        General: No swelling or tenderness. Normal range of motion.  Skin:    General: Skin is warm and dry.  Neurological:     General: No focal deficit present.     Mental Status: She is alert and oriented to person, place, and time.  Psychiatric:        Mood and Affect: Mood normal.        Behavior: Behavior normal.     Lab Results:  Results for orders placed or performed in visit on 04/07/22 (from the past 24 hour(s))  Urinalysis, Routine w reflex microscopic     Status: Abnormal   Collection Time: 04/07/22  3:42 PM  Result Value Ref Range   Specific Gravity, UA 1.010 1.005 - 1.030   pH, UA 5.5 5.0 - 7.5   Color, UA Yellow Yellow   Appearance Ur Clear Clear   Leukocytes,UA Negative Negative   Protein,UA Negative Negative/Trace   Glucose, UA 3+ (A) Negative   Ketones, UA Negative Negative   RBC, UA Negative Negative   Bilirubin, UA Negative Negative   Urobilinogen, Ur 0.2 0.2 - 1.0 mg/dL   Nitrite, UA Negative Negative   Microscopic Examination Comment    Narrative   Performed at:  [facility] [address], [place], [place]  [id number] Lab Director: Mina Marble MT, Phone:  [phone]    BMET No results for input(s): "NA", "K", "CL", "CO2", "GLUCOSE", "BUN", "CREATININE", "CALCIUM" in the last 72 hours. PT/INR No results for input(s): "LABPROT", "INR" in the last 72  hours. ABG No results for input(s): "PHART", "HCO3" in the last 72 hours.  Invalid input(s): "PCO2", "PO2"  Studies/Results: No results found.   Assessment/Plan: Weak urinary stream with a cystocele that could be kinking the urethra.  I have discussed postural changes to aid voiding and will have her try that first.  We could consider urodynamics if she doesn't improve, but as long as she is emptying completely she could be monitored.  Vaginal atrophy and small rectocele.  No treatment needed at this time.    No orders of the defined types were placed in this encounter.    Orders Placed This Encounter  Procedures   Urinalysis, Routine w reflex microscopic   BLADDER SCAN AMB NON-IMAGING     Return in about 3 months (around 07/08/2022) for flowrate and PVR on return.    CC: Dr. Ihor Dow.      Irine Seal 04/08/2022 [phone]

## 2022-04-08 ENCOUNTER — Encounter: Payer: Self-pay | Admitting: Urology

## 2022-04-14 DIAGNOSIS — Z1231 Encounter for screening mammogram for malignant neoplasm of breast: Secondary | ICD-10-CM | POA: Diagnosis not present

## 2022-04-21 ENCOUNTER — Ambulatory Visit (INDEPENDENT_AMBULATORY_CARE_PROVIDER_SITE_OTHER): Payer: Medicare Other | Admitting: Psychiatry

## 2022-04-21 ENCOUNTER — Encounter (HOSPITAL_COMMUNITY): Payer: Self-pay | Admitting: Psychiatry

## 2022-04-21 VITALS — BP 102/72 | Temp 98.4°F | Ht 63.0 in | Wt 191.0 lb

## 2022-04-21 DIAGNOSIS — F331 Major depressive disorder, recurrent, moderate: Secondary | ICD-10-CM | POA: Diagnosis not present

## 2022-04-21 DIAGNOSIS — F411 Generalized anxiety disorder: Secondary | ICD-10-CM

## 2022-04-21 MED ORDER — DULOXETINE HCL 20 MG PO CPEP
20.0000 mg | ORAL_CAPSULE | Freq: Every day | ORAL | 0 refills | Status: DC
Start: 2022-04-21 — End: 2023-03-06

## 2022-04-21 NOTE — Progress Notes (Signed)
Psychiatric Initial Adult Assessment   Patient Identification: Carolyn [name] MRN:  [id number] Date of Evaluation:  04/21/2022 Referral Source: primary care Chief Complaint:   Chief Complaint  Patient presents with   Establish Care   Depression   Visit Diagnosis:    ICD-10-CM   1. MDD (major depressive disorder), recurrent episode, moderate (HCC)  F33.1     2. GAD (generalized anxiety disorder)  F41.1       History of Present Illness:  63 years ol married white female, referred by pcp to establish care for depression, anxiety, currently Retired,    Has had depression for since age 35"s and has been treated with different medications, of all wellbutrin has been consistent in helping Prozac has caused allergic reaction , not sure of what other meds used Patient is currently retired her stressors include her husband is sick with 1 kidney.  At times she goes down with depression and crying spells decreased energy withdrawn tiredness disturbed sleep and despair or sadness but not suicidal thoughts  There is no associated psychotic symptoms there is no clear manic symptoms currently in the past  Regarding the medication she feels Wellbutrin does help but when it was increased it did not help and had side effects.  She prefers to be with a few people does not like crowds does not like loud noises  She also endorses anxiety worries excessive worry worries about future worries about her health and her husband's health at times she feels her worries can be excessive and mind racing with disturbed sleep at night    Aggravating factor: medical co morbidities, some difficult childhood, covid , retirement, husband sick with one kidney Modifying factor: husband, Joava witness  No prior psych admission  No suicide attempt  Severity depressed Duration all adult life   Past Psychiatric History: depression, anxiety  Previous Psychotropic Medications: Yes   Substance Abuse History in the  last 12 months:  No.  Consequences of Substance Abuse: NA  Past Medical History:  Past Medical History:  Diagnosis Date   Depression    Diabetes mellitus without complication ([place])    Diastolic dysfunction    Hyperlipidemia    Myasthenia gravis ([place])    Patient is Jehovah's Witness    Pulmonary embolism ([place]) 05/18/2021   Sleep apnea     Past Surgical History:  Procedure Laterality Date   APPENDECTOMY     BIOPSY  11/11/2020   Procedure: BIOPSY;  Surgeon: Harvel Quale, MD;  [facility];  Service: Gastroenterology;;  duodenum gastric   BUNIONECTOMY     COLONOSCOPY WITH PROPOFOL N/A 11/11/2020   Procedure: COLONOSCOPY WITH PROPOFOL;  Surgeon: Harvel Quale, MD;  [facility];  Service: Gastroenterology;  Laterality: N/A;  11:15   ESOPHAGOGASTRODUODENOSCOPY (EGD) WITH PROPOFOL N/A 11/11/2020   Procedure: ESOPHAGOGASTRODUODENOSCOPY (EGD) WITH PROPOFOL;  Surgeon: Harvel Quale, MD;  [facility];  Service: Gastroenterology;  Laterality: N/A;  to arrive at [place], ADENOIDECTOMY, BILATERAL MYRINGOTOMY AND TUBES      Family Psychiatric History: son; bipolar,  Mom : depression Dad ; alcohol use  Family History:  Family History  Problem Relation Age of Onset   Arthritis Mother    Depression Mother    Diabetes Mother    Hypertension Mother    Alcohol abuse Father    Hypertension Father    Heart disease Father 33       Massive  MI   Diabetes Brother    Diabetes Brother    Heart disease Brother    Diabetes Brother        Type I     Social History:   Social History   Socioeconomic History   Marital status: Married    Spouse name: Legrand Como   Number of children: 4   Years of education: some college   Highest education level: Some college, no degree  Occupational History   Occupation: Retired  Tobacco Use   Smoking status: Never   Smokeless tobacco: Never  Vaping Use    Vaping Use: Never used  Substance and Sexual Activity   Alcohol use: Yes    Comment: socially   Drug use: No   Sexual activity: Yes    Birth control/protection: Other-see comments    Comment: postmenopausal  Other Topics Concern   Not on file  Social History Narrative   Not on file   Social Determinants of Health   Financial Resource Strain: Low Risk  (06/19/2020)   Overall Financial Resource Strain (CARDIA)    Difficulty of Paying Living Expenses: Not very hard  Food Insecurity: No Food Insecurity (02/10/2021)   Hunger Vital Sign    Worried About Running Out of Food in the Last Year: Never true    Ran Out of Food in the Last Year: Never true  Transportation Needs: No Transportation Needs (02/10/2021)   PRAPARE - Hydrologist (Medical): No    Lack of Transportation (Non-Medical): No  Physical Activity: Insufficiently Active (02/10/2021)   Exercise Vital Sign    Days of Exercise per Week: 2 days    Minutes of Exercise per Session: 20 min  Stress: No Stress Concern Present (02/10/2021)   JAARS    Feeling of Stress : Not at all  Social Connections: Moderately Integrated (02/10/2021)   Social Connection and Isolation Panel [NHANES]    Frequency of Communication with Friends and Family: More than three times a week    Frequency of Social Gatherings with Friends and Family: Three times a week    Attends Religious Services: More than 4 times per year    Active Member of Clubs or Organizations: No    Attends Archivist Meetings: Never    Marital Status: Married    Additional Social History: grew up with parents, dad was alcoholic, some challenges  Married for 59 years now  Allergies:   Allergies  Allergen Reactions   Prozac [Fluoxetine Hcl] Anaphylaxis   Codeine Other (See Comments)    Intolerance    Other     -mycin medications: has myasthenia gravis    Sulfa  Antibiotics Hives    Metabolic Disorder Labs: Lab Results  Component Value Date   HGBA1C 6.9 (H) 03/30/2022   MPG 148 12/28/2020   MPG 148 08/24/2020   No results found for: "PROLACTIN" Lab Results  Component Value Date   CHOL 149 11/10/2021   TRIG 279 (H) 11/10/2021   HDL 36 (L) 11/10/2021   CHOLHDL 4.1 11/10/2021   LDLCALC 68 11/10/2021   LDLCALC 83 05/28/2021   Lab Results  Component Value Date   TSH 2.450 03/30/2022    Therapeutic Level Labs: No results found for: "LITHIUM" No results found for: "CBMZ" No results found for: "VALPROATE"  Current Medications: Current Outpatient Medications  Medication Sig Dispense Refill   atorvastatin (LIPITOR) 10 MG tablet Take 1 tablet (10 mg total)  by mouth daily. 90 tablet 1   Blood Glucose Monitoring Suppl (BLOOD GLUCOSE SYSTEM PAK) KIT Use as directed to monitor FSBS 2x daily. Dx: E11.9 1 each 1   Cholecalciferol (VITAMIN D3) 125 MCG (5000 UT) TABS Take 5,000 Units by mouth daily.     dapagliflozin propanediol (FARXIGA) 10 MG TABS tablet Take 1 tablet (10 mg total) by mouth daily before breakfast. 90 tablet 2   dicyclomine (BENTYL) 10 MG capsule Take 1 capsule (10 mg total) by mouth every 12 (twelve) hours as needed for spasms. 60 capsule 1   glucose blood (ONETOUCH VERIO) test strip USE TO TEST BLOOD SUGAR TWICE DAILY 100 strip 3   Glycerin-Hypromellose-PEG 400 (DRY EYE RELIEF DROPS) 0.2-0.2-1 % SOLN Place 1 drop into both eyes daily as needed (Dry eye).     hydrOXYzine (VISTARIL) 25 MG capsule Take 1 capsule (25 mg total) by mouth at bedtime as needed for anxiety (Insomnia). 30 capsule 0   icosapent Ethyl (VASCEPA) 1 g capsule TAKE 1 CAPSULE(1 GRAM) BY MOUTH DAILY 90 capsule 0   ketoconazole (NIZORAL) 2 % cream Apply 1 application topically daily. (Patient taking differently: Apply 1 application  topically daily as needed for irritation.) 15 g 0   losartan-hydrochlorothiazide (HYZAAR) 100-12.5 MG tablet TAKE 1 TABLET BY MOUTH  DAILY 30 tablet 3   metoprolol succinate (TOPROL-XL) 25 MG 24 hr tablet TAKE 1 TABLET(25 MG) BY MOUTH DAILY 90 tablet 1   Multiple Vitamins-Minerals (MULTI COMPLETE) CAPS Take 1 capsule by mouth daily.     WELLBUTRIN XL 150 MG 24 hr tablet TAKE 1 TABLET BY MOUTH ONCE DAILY (Patient taking differently: Take 150 mg by mouth daily.) 90 tablet 3   No current facility-administered medications for this visit.    Psychiatric Specialty Exam: Review of Systems  Cardiovascular:  Negative for chest pain.  Neurological:  Negative for tremors.  Psychiatric/Behavioral:  Positive for dysphoric mood. Negative for agitation and self-injury.     Blood pressure 102/72, temperature 98.4 F (36.9 C), height _0  (1.6 m), weight 191 lb (86.6 kg).Body mass index is 33.83 kg/m.  General Appearance: Casual  Eye Contact:  Fair  Speech:  Clear and Coherent  Volume:  Normal  Mood:  Dysphoric  Affect:  Congruent  Thought Process:  Goal Directed  Orientation:  Full (Time, Place, and Person)  Thought Content:  Rumination  Suicidal Thoughts:  No  Homicidal Thoughts:  No  Memory:  Immediate;   Fair  Judgement:  Fair  Insight:  Fair  Psychomotor Activity:  Normal  Concentration:  Concentration: Fair  Recall:  [place]: Good  Akathisia:  No  Handed:    AIMS (if indicated):  not done  Assets:  Desire for Improvement Financial Resources/Insurance Housing Leisure Time  ADL's:  Intact  Cognition: WNL  Sleep:  Fair   Screenings: GAD-7    Flowsheet Row Office Visit from 03/30/2022 in [facility] Office Visit from 02/22/2022 in [facility] Office Visit from 08/31/2021 in [facility] Office Visit from 08/19/2021 in [facility]  Total GAD-7 Score _1 PHQ2-9    [place] Visit from 04/21/2022 in [facility] Office Visit from 03/30/2022 in [facility] Office  Visit from 02/22/2022 in [facility] from 02/14/2022 in [facility] Visit from 11/10/2021 in [facility]  PHQ-2 Total Score 4 1 6  6 0  PHQ-9 Total Score _0 0      Flowsheet Row Office Visit from 04/21/2022 in [facility] from 05/18/2021 in [facility] No Risk No Risk       Assessment and Plan: as follows Major depressive disorder recurrent moderate; continue Wellbutrin talked about medications and options we will start Cymbalta very small dose of 20 mg for augmentation.  Discussed distraction from negative thoughts.  Recommend therapy to work on coping skills  Generalized anxiety disorder; start Cymbalta 20 mg and if needed we can increase it  Add activities and ME time . Follow-up in 4 to 6 weeks or earlier if needed patient not suicidal she keeps her busy with some activities but planning to add more discussed distraction from negative thoughts And schedule therapy  Direct care time spent 45 minutes including face-to-face, chart review documentation and collaboration if any   Collaboration of Care: Primary Care Provider AEB med and notes reviewed  Patient/Guardian was advised Release of Information must be obtained prior to any record release in order to collaborate their care with an outside provider. Patient/Guardian was advised if they have not already done so to contact the registration department to sign all necessary forms in order for [place] to release information regarding their care.   Consent: Patient/Guardian gives verbal consent for treatment and assignment of benefits for services provided during this visit. Patient/Guardian expressed understanding and agreed to proceed.   Merian Capron, MD 6/22/20232:04 PM

## 2022-05-25 ENCOUNTER — Other Ambulatory Visit: Payer: Self-pay | Admitting: Family Medicine

## 2022-05-26 ENCOUNTER — Ambulatory Visit (INDEPENDENT_AMBULATORY_CARE_PROVIDER_SITE_OTHER): Payer: Medicare Other | Admitting: Gastroenterology

## 2022-05-26 ENCOUNTER — Encounter (INDEPENDENT_AMBULATORY_CARE_PROVIDER_SITE_OTHER): Payer: Self-pay | Admitting: Gastroenterology

## 2022-05-26 VITALS — BP 132/83 | HR 69 | Temp 98.5°F | Ht 63.0 in | Wt 191.8 lb

## 2022-05-26 DIAGNOSIS — K641 Second degree hemorrhoids: Secondary | ICD-10-CM

## 2022-05-26 DIAGNOSIS — K5900 Constipation, unspecified: Secondary | ICD-10-CM | POA: Diagnosis not present

## 2022-05-26 DIAGNOSIS — K6289 Other specified diseases of anus and rectum: Secondary | ICD-10-CM

## 2022-05-26 MED ORDER — HYOSCYAMINE SULFATE 0.125 MG SL SUBL: 0.1250 mg | SUBLINGUAL_TABLET | Freq: Four times a day (QID) | SUBLINGUAL | 1 refills | Status: DC | PRN

## 2022-05-26 NOTE — Progress Notes (Signed)
Referring Provider: Lindell Spar, MD Primary Care Physician:  Lindell Spar, MD Primary GI Physician: Jenetta Downer  Chief Complaint  Patient presents with   Hemorrhoids    Patient arrives to discuss hemorrhoids. States skin is splitting around rectum where she has been straining. Saw blood one time.    HPI:   Carolyn [name] is a 71 y.o. female with past medical history of DM, depression HLD, MG and IBS  Patient presenting today for hemorrhoids.  History: Last seen March 2022, at that time, Patient reported trying to follow low FODMAP diet but states "sometimes it difficult to follow it". Not taking any Miralax anymore as she had been able to have bowel movements with her current diet. Having a BM almost daily. Advised to continue with current dietary changes and use miralax as needed.  Present: Patient reports ongoing constipation for years though worse recently. She has been going 2 days without a BM and feeling miserable when she does. She tries to eat a diet high in fruits and veggies which helps some. She also takes magnesium pills. She will have a few mornings where she will go regularly then will have a few days where she needs to strain to go to the restroom. She will then have to strain quite a bit to defecate. Sometimes feels that hemorrhoids prolapse but retract on their own. She will sometimes have to take a stool softener, 1-2x week. She noted a small amount of blood on toilet tissue recently when wiping. No blood in the toilet. Denies melena. She denies burning or itching to the rectal area, she reports some mild discomfort when having a BM. She endorses for many years she has had some intermittent sharp rectal pain that comes on anytime and is not associated with BMs. She has taken hyociamine for this in the past with good results. Feels that water intake is good.   Last EGD: 11/11/2020, there was presence of some gastric erosions in the antrum (biopsies taken to rule out H.  pylori which were negative, no presence of GIM or dysplasia), normal duodenum (negative for celiac disease). Last Colonoscopy: 11/11/2020, normal colon with presence of internal hemorrhoids  Past Medical History:  Diagnosis Date   Depression    Diabetes mellitus without complication ([place])    Diastolic dysfunction    Hyperlipidemia    Myasthenia gravis ([place])    Patient is Jehovah's Witness    Pulmonary embolism ([place]) 05/18/2021   Sleep apnea     Past Surgical History:  Procedure Laterality Date   APPENDECTOMY     BIOPSY  11/11/2020   Procedure: BIOPSY;  Surgeon: Harvel Quale, MD;  [facility];  Service: Gastroenterology;;  duodenum gastric   BUNIONECTOMY     COLONOSCOPY WITH PROPOFOL N/A 11/11/2020   Procedure: COLONOSCOPY WITH PROPOFOL;  Surgeon: Harvel Quale, MD;  [facility];  Service: Gastroenterology;  Laterality: N/A;  11:15   ESOPHAGOGASTRODUODENOSCOPY (EGD) WITH PROPOFOL N/A 11/11/2020   Procedure: ESOPHAGOGASTRODUODENOSCOPY (EGD) WITH PROPOFOL;  Surgeon: Harvel Quale, MD;  [facility];  Service: Gastroenterology;  Laterality: N/A;  to arrive at [place], ADENOIDECTOMY, BILATERAL MYRINGOTOMY AND TUBES      Current Outpatient Medications  Medication Sig Dispense Refill   Blood Glucose Monitoring Suppl (BLOOD GLUCOSE SYSTEM PAK) KIT Use as directed to monitor FSBS 2x daily. Dx: E11.9 1 each 1   Cholecalciferol (VITAMIN D3) 125 MCG (5000  UT) TABS Take 5,000 Units by mouth daily.     dapagliflozin propanediol (FARXIGA) 10 MG TABS tablet Take 1 tablet (10 mg total) by mouth daily before breakfast. 90 tablet 2   dicyclomine (BENTYL) 10 MG capsule Take 1 capsule (10 mg total) by mouth every 12 (twelve) hours as needed for spasms. 60 capsule 1   glucose blood (ONETOUCH VERIO) test strip USE TO TEST BLOOD SUGAR TWICE DAILY 100 strip 3   Glycerin-Hypromellose-PEG 400 (DRY EYE  RELIEF DROPS) 0.2-0.2-1 % SOLN Place 1 drop into both eyes daily as needed (Dry eye).     hydrOXYzine (VISTARIL) 25 MG capsule Take 1 capsule (25 mg total) by mouth at bedtime as needed for anxiety (Insomnia). 30 capsule 0   icosapent Ethyl (VASCEPA) 1 g capsule TAKE 1 CAPSULE(1 GRAM) BY MOUTH DAILY 90 capsule 0   ketoconazole (NIZORAL) 2 % cream Apply 1 application topically daily. (Patient taking differently: Apply 1 application  topically daily as needed for irritation.) 15 g 0   losartan-hydrochlorothiazide (HYZAAR) 100-12.5 MG tablet TAKE 1 TABLET BY MOUTH DAILY 30 tablet 3   metoprolol succinate (TOPROL-XL) 25 MG 24 hr tablet TAKE 1 TABLET(25 MG) BY MOUTH DAILY 90 tablet 1   Multiple Vitamins-Minerals (MULTI COMPLETE) CAPS Take 1 capsule by mouth daily.     WELLBUTRIN XL 150 MG 24 hr tablet TAKE 1 TABLET BY MOUTH ONCE DAILY (Patient taking differently: Take 150 mg by mouth daily.) 90 tablet 3   DULoxetine (CYMBALTA) 20 MG capsule Take 1 capsule (20 mg total) by mouth daily. (Patient not taking: Reported on 05/26/2022) 30 capsule 0   No current facility-administered medications for this visit.    Allergies as of 05/26/2022 - Review Complete 05/26/2022  Allergen Reaction Noted   Prozac [fluoxetine hcl] Anaphylaxis 12/16/2015   Codeine Other (See Comments) 12/16/2015   Other  10/30/2017   Sulfa antibiotics Hives 12/16/2015    Family History  Problem Relation Age of Onset   Arthritis Mother    Depression Mother    Diabetes Mother    Hypertension Mother    Alcohol abuse Father    Hypertension Father    Heart disease Father 34       Massive MI   Diabetes Brother    Diabetes Brother    Heart disease Brother    Diabetes Brother        Type I     Social History   Socioeconomic History   Marital status: Married    Spouse name: Legrand Como   Number of children: 4   Years of education: some college   Highest education level: Some college, no degree  Occupational History    Occupation: Retired  Tobacco Use   Smoking status: Never    Passive exposure: Never   Smokeless tobacco: Never  Vaping Use   Vaping Use: Never used  Substance and Sexual Activity   Alcohol use: Yes    Comment: socially   Drug use: No   Sexual activity: Yes    Birth control/protection: Other-see comments    Comment: postmenopausal  Other Topics Concern   Not on file  Social History Narrative   Not on file   Social Determinants of Health   Financial Resource Strain: Low Risk  (06/19/2020)   Overall Financial Resource Strain (CARDIA)    Difficulty of Paying Living Expenses: Not very hard  Food Insecurity: No Food Insecurity (02/10/2021)   Hunger Vital Sign    Worried About Running Out of Food in  the Last Year: Never true    [place] in the Last Year: Never true  Transportation Needs: No Transportation Needs (02/10/2021)   PRAPARE - Hydrologist (Medical): No    Lack of Transportation (Non-Medical): No  Physical Activity: Insufficiently Active (02/10/2021)   Exercise Vital Sign    Days of Exercise per Week: 2 days    Minutes of Exercise per Session: 20 min  Stress: No Stress Concern Present (02/10/2021)   [place]    Feeling of Stress : Not at all  Social Connections: Moderately Integrated (02/10/2021)   Social Connection and Isolation Panel [NHANES]    Frequency of Communication with Friends and Family: More than three times a week    Frequency of Social Gatherings with Friends and Family: Three times a week    Attends Religious Services: More than 4 times per year    Active Member of Clubs or Organizations: No    Attends Archivist Meetings: Never    Marital Status: Married   Review of systems General: negative for malaise, night sweats, fever, chills, weight loss Neck: Negative for lumps, goiter, pain and significant neck swelling Resp: Negative for cough,  wheezing, dyspnea at rest CV: Negative for chest pain, leg swelling, palpitations, orthopnea GI: denies melena,  nausea, vomiting, diarrhea, dysphagia, odyonophagia, early satiety or unintentional weight loss. +rectal pain/irritation +constipation +toilet tissue hematochezia  MSK: Negative for joint pain or swelling, back pain, and muscle pain. Derm: Negative for itching or rash Psych: Denies depression, anxiety, memory loss, confusion. No homicidal or suicidal ideation.  Heme: Negative for prolonged bleeding, bruising easily, and swollen nodes. Endocrine: Negative for cold or heat intolerance, polyuria, polydipsia and goiter. Neuro: negative for tremor, gait imbalance, syncope and seizures. The remainder of the review of systems is noncontributory.  Physical Exam: There were no vitals taken for this visit. General:   Alert and oriented. No distress noted. Pleasant and cooperative.  Head:  Normocephalic and atraumatic. Eyes:  Conjuctiva clear without scleral icterus. Mouth:  Oral mucosa pink and moist. Good dentition. No lesions. Heart: Normal rate and rhythm, s1 and s2 heart sounds present.  Lungs: Clear lung sounds in all lobes. Respirations equal and unlabored. Abdomen:  +BS, soft, non-tender and non-distended. No rebound or guarding. No HSM or masses noted. Rectal: wendy, LPN present as witness, no obvious fissures or external lesions, anal sphincter tone mildly hypertonic, no masses or lesions noted, hard stool felt in rectal vault.  Derm: No palmar erythema or jaundice Msk:  Symmetrical without gross deformities. Normal posture. Extremities:  Without edema. Neurologic:  Alert and  oriented x4 Psych:  Alert and cooperative. Normal mood and affect.  Invalid input(s): "6 MONTHS"   ASSESSMENT: Carolyn [name] is a 71 y.o. female presenting today for constipation, rectal discomfort.  ongoing constipation for years though worse recently. Will sometimes go 2 days without a BM. She will  then have to strain quite a bit to defecate. She will sometimes have to take a stool softener, 1-2x week. She noted a small amount of blood on toilet tissue recently when wiping.  She denies burning or itching to the rectal area, she reports some mild discomfort when having a BM. She endorses for many years she has had some intermittent sharp rectal pain that comes on anytime and is not associated with BMs. She has taken hyociamine for this in the past with good results. Rectal  exam was without concerning findings such as lesions or fissures though tone somewhat hypertonic. Symptoms likely from hemorrhoids, though given her c/o rectal irritation she can try some topical zinc oxide to help with this. Intermittent rectal pain likely secondary to anal spasms, took hyoscyamine for this previously with good results, will send refill of this. Can add fiber supplement TID to help soften stools. . Discussed pursuing hemorrhoid banding for further symptom management, patient will let me know if she is interested in this.    PLAN:  Make sure to drink [place] of water  2. Benefiber 1T TID with meals  3. Desitin topical on anal area 4. Rx Hyoscyamine q6h for anal spasms 5. Avoid straining 6. Limit toilet time  7. Consider Hemorrhoid banding  All questions were answered, patient verbalized understanding and is in agreement with plan as outlined above.    Follow Up: 3 months   Brelee Renk L. Alver Sorrow, MSN, APRN, AGNP-C Adult-Gerontology Nurse Practitioner [facility]

## 2022-05-26 NOTE — Patient Instructions (Addendum)
I did not see any tears in rectal tissue on exam, which is good news. I think most of your symptoms are from internal hemorrhoids and some anal spasms. It is important to keep stools nice and soft, avoid straining and limit toilet time to <5 minutes. You would be a good candidate for hemorrhoid banding which would help most of your symptoms if you are interested in this.  Please start benefiber 1T Three times per day with meals and make sure you are drinking atleast 64 oz of water per day.  Keep area clean and dry, avoid soaps and over cleaning as this can irritate skin down there. You can try otc desitin or other zinc oxide based barrier cream to help keep skin protected. Make sure diet is high in fruits, veggies and whole grains  I will send hyoscyamine refill to be used as needed for anal spasms (please be aware you should not take this and dicyclomine/bentyl as they are the same type medication), please let me know if symptoms worsen, fail to improve or if you would like to schedule hemorrhoid banding.   Follow up 3 months

## 2022-05-29 ENCOUNTER — Encounter (HOSPITAL_COMMUNITY): Payer: Self-pay | Admitting: Emergency Medicine

## 2022-05-29 ENCOUNTER — Other Ambulatory Visit: Payer: Self-pay

## 2022-05-29 ENCOUNTER — Emergency Department (HOSPITAL_COMMUNITY): Payer: Medicare Other

## 2022-05-29 ENCOUNTER — Emergency Department (HOSPITAL_COMMUNITY)
Admission: EM | Admit: 2022-05-29 | Discharge: 2022-05-29 | Disposition: A | Discharge status: Home / Self Care | Payer: Medicare Other | Attending: Emergency Medicine | Admitting: Emergency Medicine

## 2022-05-29 DIAGNOSIS — R002 Palpitations: Secondary | ICD-10-CM | POA: Diagnosis not present

## 2022-05-29 DIAGNOSIS — Z79899 Other long term (current) drug therapy: Secondary | ICD-10-CM | POA: Insufficient documentation

## 2022-05-29 DIAGNOSIS — R079 Chest pain, unspecified: Secondary | ICD-10-CM | POA: Diagnosis not present

## 2022-05-29 DIAGNOSIS — R0789 Other chest pain: Secondary | ICD-10-CM | POA: Diagnosis not present

## 2022-05-29 DIAGNOSIS — I10 Essential (primary) hypertension: Secondary | ICD-10-CM | POA: Insufficient documentation

## 2022-05-29 LAB — CBC
HCT: 47.2 % — ABNORMAL HIGH (ref 36.0–46.0)
Hemoglobin: 16.6 g/dL — ABNORMAL HIGH (ref 12.0–15.0)
MCH: 32.4 pg (ref 26.0–34.0)
MCHC: 35.2 g/dL (ref 30.0–36.0)
MCV: 92.2 fL (ref 80.0–100.0)
Platelets: 242 10*3/uL (ref 150–400)
RBC: 5.12 MIL/uL — ABNORMAL HIGH (ref 3.87–5.11)
RDW: 12.3 % (ref 11.5–15.5)
WBC: 6.5 10*3/uL (ref 4.0–10.5)
nRBC: 0 % (ref 0.0–0.2)

## 2022-05-29 LAB — BASIC METABOLIC PANEL
Anion gap: 11 (ref 5–15)
BUN: 18 mg/dL (ref 8–23)
CO2: 26 mmol/L (ref 22–32)
Calcium: 9.6 mg/dL (ref 8.9–10.3)
Chloride: 101 mmol/L (ref 98–111)
Creatinine, Ser: 0.83 mg/dL (ref 0.44–1.00)
GFR, Estimated: >60 mL/min (ref >60)
Glucose, Bld: 202 mg/dL — ABNORMAL HIGH (ref 70–99)
Potassium: 3.4 mmol/L — ABNORMAL LOW (ref 3.5–5.1)
Sodium: 138 mmol/L (ref 135–145)

## 2022-05-29 LAB — D-DIMER, QUANTITATIVE: D-Dimer, Quant: <0.27 ug/mL-FEU (ref 0.00–0.50)

## 2022-05-29 LAB — TROPONIN I (HIGH SENSITIVITY): Troponin I (High Sensitivity): 2 ng/L (ref <18)

## 2022-05-29 MED ORDER — PANTOPRAZOLE SODIUM 40 MG PO TBEC: 40.0000 mg | DELAYED_RELEASE_TABLET | Freq: Every day | ORAL | 1 refills | Status: DC

## 2022-05-29 MED ORDER — ASPIRIN 81 MG PO CHEW
324.0000 mg | CHEWABLE_TABLET | Freq: Once | ORAL | Status: AC
Start: 2022-05-29 — End: 2022-05-29
  Administered 2022-05-29: 324 mg via ORAL
  Filled 2022-05-29: qty 4

## 2022-05-29 NOTE — Discharge Instructions (Signed)
Thankfully all of your tests have been normal with no signs of heart attack or blood clot.  I do want you to start a medication called pantoprazole to treat acid reflux but I do want you to see your doctor within the next few days for recheck if still having symptoms.  If you are feeling better great, if you are feeling worse please come back to the hospital immediately for repeat evaluation.  I have prescribed a medication called Protonix which is a medication that is used to treat acid reflux.  These type of medications will help to reduce the amount of acid in your stomach and if taken regularly they will help reduce your risk of stomach ulcers and your symptoms are related to acid reflux.  It is important that you take this medication at the same time every day.  It is also important to know that you can take this safely with medicine such as Pepcid.  It is similar to medications called omeprazole which is over-the-counter so if you choose to use that instead if it is a lower cost that is okay.  I want you to follow-up with your family doctor for further evaluation if this medication is not helping but return to the emergency department for severe or worsening symptoms  Thank you for allowing [place] to treat you in the emergency department today.  After reviewing your examination and potential testing that was done it appears that you are safe to go home.  I would like for you to follow-up with your doctor within the next several days, have them obtain your results and follow-up with them to review all of these tests.  If you should develop severe or worsening symptoms return to the emergency department immediately

## 2022-05-29 NOTE — ED Provider Notes (Signed)
[facility] EMERGENCY DEPARTMENT Provider Note   CSN: [id number] Arrival date & time: 05/29/22  6387     History  Chief Complaint  Patient presents with   Chest Pain    Carolyn [name] is a 71 y.o. female.   Chest Pain  This patient is a 71 year old female with a known history of hypertension on metoprolol losartan and hydrochlorothiazide, she has a history of a prior pulmonary embolism for which she was treated with 6 months of anticoagulation, that was about 1 year ago.  She has no history of obstructive coronary disease, due to a history of dyspnea on exertion in 2020 she underwent a coronary CT scan, that showed a calcium score of 0, she had normal coronaries at that time.  The CT scan that was performed in July 2022 showed "study is positive for a small nonobstructive distal segmental and subsegmental sized embolus in the right lower lobe".  The patient reports that there was no obvious answer for why she had developed a blood clot, she was in her good state of health until about 1 week ago when she started to develop some chest discomfort.  This is a throbbing discomfort that has been fairly stable all week, it is not exertional, possibly somewhat positional though she is very vague on the details and reports that it was only this morning when she woke up that she felt like the pain is radiating to her back which made her more concerned.  She has no swelling of her legs, no recent trauma travel or immobilization.  She does not feel particularly short of breath but has been a little bit sweaty.      Home Medications Prior to Admission medications   Medication Sig Start Date End Date Taking? Authorizing Provider  pantoprazole (PROTONIX) 40 MG tablet Take 1 tablet (40 mg total) by mouth daily. 05/29/22 07/28/22 Yes Noemi Chapel, MD  Blood Glucose Monitoring Suppl (BLOOD GLUCOSE SYSTEM PAK) KIT Use as directed to monitor FSBS 2x daily. Dx: E11.9 10/30/17   Alycia Rossetti, MD   Cholecalciferol (VITAMIN D3) 125 MCG (5000 UT) TABS Take 5,000 Units by mouth daily.    [provider]  dapagliflozin propanediol (FARXIGA) 10 MG TABS tablet Take 1 tablet (10 mg total) by mouth daily before breakfast. 10/21/21   Lindell Spar, MD  DULoxetine (CYMBALTA) 20 MG capsule Take 1 capsule (20 mg total) by mouth daily. Patient not taking: Reported on 05/26/2022 04/21/22   Merian Capron, MD  glucose blood (ONETOUCH VERIO) test strip USE TO TEST BLOOD SUGAR TWICE DAILY 05/31/21   Lindell Spar, MD  Glycerin-Hypromellose-PEG 400 (DRY EYE RELIEF DROPS) 0.2-0.2-1 % SOLN Place 1 drop into both eyes daily as needed (Dry eye).    [provider]  hydrOXYzine (VISTARIL) 25 MG capsule Take 1 capsule (25 mg total) by mouth at bedtime as needed for anxiety (Insomnia). 03/30/22   Lindell Spar, MD  hyoscyamine (LEVSIN SL) 0.125 MG SL tablet Place 1 tablet (0.125 mg total) under the tongue every 6 (six) hours as needed (spasms). 05/26/22   Carlan, Deatra Robinson, NP  icosapent Ethyl (VASCEPA) 1 g capsule TAKE 1 CAPSULE(1 GRAM) BY MOUTH DAILY 05/25/22   Fayrene Helper, MD  ketoconazole (NIZORAL) 2 % cream Apply 1 application topically daily. Patient taking differently: Apply 1 application  topically daily as needed for irritation. 03/10/21   Lindell Spar, MD  losartan-hydrochlorothiazide (HYZAAR) 100-12.5 MG tablet TAKE 1 TABLET BY MOUTH DAILY  03/17/22   Lindell Spar, MD  metoprolol succinate (TOPROL-XL) 25 MG 24 hr tablet TAKE 1 TABLET(25 MG) BY MOUTH DAILY 01/19/22   Lindell Spar, MD  Multiple Vitamins-Minerals [facility] COMPLETE) CAPS Take 1 capsule by mouth daily.    [provider]  WELLBUTRIN XL 150 MG 24 hr tablet TAKE 1 TABLET BY MOUTH ONCE DAILY Patient taking differently: Take 150 mg by mouth daily. 03/22/21   Lindell Spar, MD      Allergies    Prozac [fluoxetine hcl], Codeine, Other, and Sulfa antibiotics    Review of Systems   Review of Systems   Cardiovascular:  Positive for chest pain.  All other systems reviewed and are negative.   Physical Exam Updated Vital Signs BP 132/82   Pulse 81   Temp 98.2 F (36.8 C) (Oral)   Resp 14   Ht 1.6 m ($Remo'5\' 3"'iONsY$ )   Wt 86.2 kg   SpO2 95%   BMI 33.66 kg/m  Physical Exam Vitals and nursing note reviewed.  Constitutional:      General: She is not in acute distress.    Appearance: She is well-developed.  HENT:     Head: Normocephalic and atraumatic.     Mouth/Throat:     Pharynx: No oropharyngeal exudate.  Eyes:     General: No scleral icterus.       Right eye: No discharge.        Left eye: No discharge.     Conjunctiva/sclera: Conjunctivae normal.     Pupils: Pupils are equal, round, and reactive to light.  Neck:     Thyroid: No thyromegaly.     Vascular: No JVD.  Cardiovascular:     Rate and Rhythm: Normal rate and regular rhythm.     Heart sounds: Normal heart sounds. No murmur heard.    No friction rub. No gallop.  Pulmonary:     Effort: Pulmonary effort is normal. No respiratory distress.     Breath sounds: Normal breath sounds. No wheezing or rales.  Chest:     Chest wall: No tenderness.  Abdominal:     General: Bowel sounds are normal. There is no distension.     Palpations: Abdomen is soft. There is no mass.     Tenderness: There is no abdominal tenderness.  Musculoskeletal:        General: No tenderness. Normal range of motion.     Cervical back: Normal range of motion and neck supple.     Right lower leg: No edema.     Left lower leg: No edema.  Lymphadenopathy:     Cervical: No cervical adenopathy.  Skin:    General: Skin is warm and dry.     Findings: No erythema or rash.  Neurological:     Mental Status: She is alert.     Coordination: Coordination normal.  Psychiatric:        Behavior: Behavior normal.     ED Results / Procedures / Treatments   Labs (all labs ordered are listed, but only abnormal results are displayed) Labs Reviewed  BASIC  METABOLIC PANEL - Abnormal; Notable for the following components:      Result Value   Potassium 3.4 (*)    Glucose, Bld 202 (*)    All other components within normal limits  CBC - Abnormal; Notable for the following components:   RBC 5.12 (*)    Hemoglobin 16.6 (*)    HCT 47.2 (*)    All other  components within normal limits  D-DIMER, QUANTITATIVE  TROPONIN I (HIGH SENSITIVITY)    EKG EKG Interpretation  Date/Time:  Sunday May 29 2022 06:40:38 EDT Ventricular Rate:  86 PR Interval:  197 QRS Duration: 84 QT Interval:  406 QTC Calculation: 486 R Axis:   18 Text Interpretation: Sinus rhythm Low voltage, precordial leads Borderline T abnormalities, anterior leads Borderline prolonged QT interval Baseline wander in lead(s) II since last tracing no significant change Confirmed by Noemi Chapel [phone]) on 05/29/2022 6:57:08 AM  Radiology DG Chest 2 View  Result Date: 05/29/2022 CLINICAL DATA:  70 year old female with chest pain radiating to the back. Palpitations. EXAM: CHEST - 2 VIEW COMPARISON:  CTA chest 05/18/2021 and earlier. FINDINGS: PA and lateral views at 0655 hours. Lung volumes and mediastinal contours remain normal. Visualized tracheal air column is within normal limits. Both lungs are clear. No pneumothorax or pleural effusion. No acute osseous abnormality identified. Chronic thoracic endplate spurring. Negative visible bowel gas. IMPRESSION: Negative.  No acute cardiopulmonary abnormality. Electronically Signed   By: Genevie Ann M.D.   On: 05/29/2022 07:09    Procedures Procedures    Medications Ordered in ED Medications  aspirin chewable tablet 324 mg (324 mg Oral Given 05/29/22 0703)    ED Course/ Medical Decision Making/ A&P                           Medical Decision Making Amount and/or Complexity of Data Reviewed Labs: ordered. Radiology: ordered.  Risk OTC drugs. Prescription drug management.   This patient presents to the ED for concern of chest pain,  this involves an extensive number of treatment options, and is a complaint that carries with it a high risk of complications and morbidity.  The differential diagnosis includes pulmonary embolism, coronary disease, pneumothorax or pneumonia, less likely to be pericarditis especially given EKG findings, mild sinus tachycardia on the monitor   Co morbidities that complicate the patient evaluation  History of hypertension and pulmonary embolism   Additional history obtained:  Additional history obtained from electronic medical record External records from outside source obtained and reviewed including CT coronary as well as prior CT angiogram   Lab Tests:  I Ordered, and personally interpreted labs.  The pertinent results include: CBC metabolic panel troponin and D-dimer all of which were unremarkable and undetectable with regards to troponin and D-dimer   Imaging Studies ordered:  I ordered imaging studies including chest x-ray I independently visualized and interpreted imaging which showed no acute findings I agree with the radiologist interpretation   Cardiac Monitoring: / EKG:  The patient was maintained on a cardiac monitor.  I personally viewed and interpreted the cardiac monitored which showed an underlying rhythm of: Normal sinus rhythm   Problem List / ED Course / Critical interventions / Medication management  Chest pain going to the back, vital signs remain normal with a discharge blood pressure of 132/82 heart rate of 81 oxygen of 95%, and a normal exam without any signs of murmur rubs or gallops.  This makes DVT and pulmonary embolism much less likely in the setting of a negative D-dimer as well I ordered medication including Protonix for chest discomfort and possible acid reflux The patient will start this medication at home as she denies being on an antacid medication at this time I have reviewed the patients home medicines and have made adjustments as  needed   Social Determinants of Health:  None  Test / Admission - Considered:  Considered admission but the patient is very low risk for coronary disease as well as pulmonary embolism and is agreeable to discharge home  I have discussed with the patient at the bedside the results, and the meaning of these results.  They have expressed her understanding to the need for follow-up with primary care physician        Final Clinical Impression(s) / ED Diagnoses Final diagnoses:  Chest pain at rest    Rx / DC Orders ED Discharge Orders          Ordered    pantoprazole (PROTONIX) 40 MG tablet  Daily        05/29/22 0737              Noemi Chapel, MD 05/29/22 [phone]

## 2022-05-29 NOTE — ED Triage Notes (Signed)
PT from home c/o central chest pain x hour that radiates to her back. Aching and sharp in character. C/o palpitations. Bryson Corona Edd Fabian

## 2022-05-29 NOTE — ED Notes (Signed)
Pt in XRAY 

## 2022-05-31 ENCOUNTER — Telehealth: Payer: Self-pay

## 2022-06-02 ENCOUNTER — Encounter: Payer: Self-pay | Admitting: Internal Medicine

## 2022-06-02 ENCOUNTER — Encounter (HOSPITAL_COMMUNITY): Payer: Self-pay | Admitting: Psychiatry

## 2022-06-02 ENCOUNTER — Ambulatory Visit (INDEPENDENT_AMBULATORY_CARE_PROVIDER_SITE_OTHER): Payer: Medicare Other | Admitting: Psychiatry

## 2022-06-02 ENCOUNTER — Ambulatory Visit (INDEPENDENT_AMBULATORY_CARE_PROVIDER_SITE_OTHER): Payer: Medicare Other | Admitting: Internal Medicine

## 2022-06-02 VITALS — BP 122/84 | HR 79 | Resp 16 | Ht 63.0 in | Wt 193.0 lb

## 2022-06-02 VITALS — BP 124/86 | HR 74 | Ht 63.0 in | Wt 191.0 lb

## 2022-06-02 DIAGNOSIS — F331 Major depressive disorder, recurrent, moderate: Secondary | ICD-10-CM | POA: Diagnosis not present

## 2022-06-02 DIAGNOSIS — Z09 Encounter for follow-up examination after completed treatment for conditions other than malignant neoplasm: Secondary | ICD-10-CM | POA: Insufficient documentation

## 2022-06-02 DIAGNOSIS — E782 Mixed hyperlipidemia: Secondary | ICD-10-CM | POA: Diagnosis not present

## 2022-06-02 DIAGNOSIS — K219 Gastro-esophageal reflux disease without esophagitis: Secondary | ICD-10-CM

## 2022-06-02 DIAGNOSIS — F411 Generalized anxiety disorder: Secondary | ICD-10-CM | POA: Diagnosis not present

## 2022-06-02 DIAGNOSIS — R079 Chest pain, unspecified: Secondary | ICD-10-CM | POA: Insufficient documentation

## 2022-06-02 DIAGNOSIS — Z789 Other specified health status: Secondary | ICD-10-CM

## 2022-06-02 MED ORDER — BUSPIRONE HCL 5 MG PO TABS: 5.0000 mg | ORAL_TABLET | Freq: Every day | ORAL | 0 refills | Status: DC

## 2022-06-02 NOTE — Patient Instructions (Signed)
Please continue taking Pantoprazole at least for the next 2 weeks.  Please continue to take other medications as prescribed.

## 2022-06-02 NOTE — Assessment & Plan Note (Signed)
On pantoprazole now Chest/epigastric pain could be due to GERD Avoid hot and spicy food

## 2022-06-02 NOTE — Progress Notes (Signed)
[facility] Follow up visit  Patient Identification: Carolyn [name] MRN:  [id number] Date of Evaluation:  06/02/2022 Referral Source: primary care Chief Complaint:   No chief complaint on file.  Visit Diagnosis:    ICD-10-CM   1. MDD (major depressive disorder), recurrent episode, moderate (HCC)  F33.1     2. GAD (generalized anxiety disorder)  F41.1       History of Present Illness:  71 years ol married white female, initially referred by pcp to establish care for depression, anxiety, currently Retired,    Has had depression for since age 71"s and has been treated with different medications, of all wellbutrin has been consistent in helping Prozac has caused allergic reaction , not sure of what other meds used Patient is currently retired her stressors include her husband is sick with 1 kidney.   Last visit added cymbalta but felt sleepy and nausea with it so stopped  Depression somewhat manageable but worries excessive at times of kids, husband      Aggravating factor: medical co morbidities, some difficult childhood, covid , retirement, husband sick with one kidney Modifying factor: husband, [place] witness   Severity gets anxious Duration all adult life   Past Psychiatric History: depression, anxiety  Previous Psychotropic Medications: Yes   Substance Abuse History in the last 12 months:  No.  Consequences of Substance Abuse: NA  Past Medical History:  Past Medical History:  Diagnosis Date   Depression    Diabetes mellitus without complication ([place])    Diastolic dysfunction    Hyperlipidemia    Myasthenia gravis ([place])    Patient is Jehovah's Witness    Pulmonary embolism ([place]) 05/18/2021   Sleep apnea     Past Surgical History:  Procedure Laterality Date   APPENDECTOMY     BIOPSY  11/11/2020   Procedure: BIOPSY;  Surgeon: Harvel Quale, MD;  Location: [facility];  Service: Gastroenterology;;  duodenum gastric   BUNIONECTOMY     COLONOSCOPY WITH  PROPOFOL N/A 11/11/2020   Procedure: COLONOSCOPY WITH PROPOFOL;  Surgeon: Harvel Quale, MD;  Location: [facility];  Service: Gastroenterology;  Laterality: N/A;  11:15   ESOPHAGOGASTRODUODENOSCOPY (EGD) WITH PROPOFOL N/A 11/11/2020   Procedure: ESOPHAGOGASTRODUODENOSCOPY (EGD) WITH PROPOFOL;  Surgeon: Harvel Quale, MD;  Location: [facility];  Service: Gastroenterology;  Laterality: N/A;  to arrive at [place], ADENOIDECTOMY, BILATERAL MYRINGOTOMY AND TUBES      Family Psychiatric History: son; bipolar,  Mom : depression Dad ; alcohol use  Family History:  Family History  Problem Relation Age of Onset   Arthritis Mother    Depression Mother    Diabetes Mother    Hypertension Mother    Alcohol abuse Father    Hypertension Father    Heart disease Father 16       Massive MI   Diabetes Brother    Diabetes Brother    Heart disease Brother    Diabetes Brother        Type I     Social History:   Social History   Socioeconomic History   Marital status: Married    Spouse name: Legrand Como   Number of children: 4   Years of education: some college   Highest education level: Some college, no degree  Occupational History   Occupation: Retired  Tobacco Use   Smoking status: Never    Passive exposure: Never   Smokeless tobacco: Never  Vaping  Use   Vaping Use: Never used  Substance and Sexual Activity   Alcohol use: Yes    Comment: socially   Drug use: No   Sexual activity: Yes    Birth control/protection: Other-see comments    Comment: postmenopausal  Other Topics Concern   Not on file  Social History Narrative   Not on file   Social Determinants of Health   Financial Resource Strain: Low Risk  (06/19/2020)   Overall Financial Resource Strain (CARDIA)    Difficulty of Paying Living Expenses: Not very hard  Food Insecurity: No Food Insecurity (02/10/2021)   Hunger Vital Sign    Worried About Running Out of Food  in the Last Year: Never true    Ran Out of Food in the Last Year: Never true  Transportation Needs: No Transportation Needs (02/10/2021)   PRAPARE - Hydrologist (Medical): No    Lack of Transportation (Non-Medical): No  Physical Activity: Insufficiently Active (02/10/2021)   Exercise Vital Sign    Days of Exercise per Week: 2 days    Minutes of Exercise per Session: 20 min  Stress: No Stress Concern Present (02/10/2021)   [place]    Feeling of Stress : Not at all  Social Connections: Moderately Integrated (02/10/2021)   Social Connection and Isolation Panel [NHANES]    Frequency of Communication with Friends and Family: More than three times a week    Frequency of Social Gatherings with Friends and Family: Three times a week    Attends Religious Services: More than 4 times per year    Active Member of Clubs or Organizations: No    Attends Archivist Meetings: Never    Marital Status: Married    Additional Social History: grew up with parents, dad was alcoholic, some challenges  Married for 71 years now  Allergies:   Allergies  Allergen Reactions   Prozac [Fluoxetine Hcl] Anaphylaxis   Codeine Other (See Comments)    Intolerance    Other     -mycin medications: has myasthenia gravis    Sulfa Antibiotics Hives    Metabolic Disorder Labs: Lab Results  Component Value Date   HGBA1C 6.9 (H) 03/30/2022   MPG 148 12/28/2020   MPG 148 08/24/2020   No results found for: "PROLACTIN" Lab Results  Component Value Date   CHOL 149 11/10/2021   TRIG 279 (H) 11/10/2021   HDL 36 (L) 11/10/2021   CHOLHDL 4.1 11/10/2021   LDLCALC 68 11/10/2021   LDLCALC 83 05/28/2021   Lab Results  Component Value Date   TSH 2.450 03/30/2022    Therapeutic Level Labs: No results found for: "LITHIUM" No results found for: "CBMZ" No results found for: "VALPROATE"  Current  Medications: Current Outpatient Medications  Medication Sig Dispense Refill   busPIRone (BUSPAR) 5 MG tablet Take 1 tablet (5 mg total) by mouth daily. 30 tablet 0   Blood Glucose Monitoring Suppl (BLOOD GLUCOSE SYSTEM PAK) KIT Use as directed to monitor FSBS 2x daily. Dx: E11.9 1 each 1   Cholecalciferol (VITAMIN D3) 125 MCG (5000 UT) TABS Take 5,000 Units by mouth daily.     dapagliflozin propanediol (FARXIGA) 10 MG TABS tablet Take 1 tablet (10 mg total) by mouth daily before breakfast. 90 tablet 2   glucose blood (ONETOUCH VERIO) test strip USE TO TEST BLOOD SUGAR TWICE DAILY 100 strip 3   Glycerin-Hypromellose-PEG 400 (DRY EYE RELIEF DROPS) 0.2-0.2-1 %  SOLN Place 1 drop into both eyes daily as needed (Dry eye).     hydrOXYzine (VISTARIL) 25 MG capsule Take 1 capsule (25 mg total) by mouth at bedtime as needed for anxiety (Insomnia). 30 capsule 0   hyoscyamine (LEVSIN SL) 0.125 MG SL tablet Place 1 tablet (0.125 mg total) under the tongue every 6 (six) hours as needed (spasms). 30 tablet 1   icosapent Ethyl (VASCEPA) 1 g capsule TAKE 1 CAPSULE(1 GRAM) BY MOUTH DAILY 90 capsule 0   ketoconazole (NIZORAL) 2 % cream Apply 1 application topically daily. (Patient taking differently: Apply 1 application  topically daily as needed for irritation.) 15 g 0   losartan-hydrochlorothiazide (HYZAAR) 100-12.5 MG tablet TAKE 1 TABLET BY MOUTH DAILY 30 tablet 3   metoprolol succinate (TOPROL-XL) 25 MG 24 hr tablet TAKE 1 TABLET(25 MG) BY MOUTH DAILY 90 tablet 1   Multiple Vitamins-Minerals (MULTI COMPLETE) CAPS Take 1 capsule by mouth daily.     pantoprazole (PROTONIX) 40 MG tablet Take 1 tablet (40 mg total) by mouth daily. 30 tablet 1   WELLBUTRIN XL 150 MG 24 hr tablet TAKE 1 TABLET BY MOUTH ONCE DAILY (Patient taking differently: Take 150 mg by mouth daily.) 90 tablet 3   No current facility-administered medications for this visit.    Psychiatric Specialty Exam: Review of Systems  Cardiovascular:   Negative for chest pain.  Neurological:  Negative for tremors.  Psychiatric/Behavioral:  Negative for agitation and self-injury.     Blood pressure 124/86, pulse 74, height _0  (1.6 m), weight 191 lb (86.6 kg), SpO2 99 %.Body mass index is 33.83 kg/m.  General Appearance: Casual  Eye Contact:  Fair  Speech:  Clear and Coherent  Volume:  Normal  Mood: stresses of family   Affect:  Congruent  Thought Process:  Goal Directed  Orientation:  Full (Time, Place, and Person)  Thought Content:  Rumination  Suicidal Thoughts:  No  Homicidal Thoughts:  No  Memory:  Immediate;   Fair  Judgement:  Fair  Insight:  Fair  Psychomotor Activity:  Normal  Concentration:  Concentration: Fair  Recall:  [place]: Good  Akathisia:  No  Handed:    AIMS (if indicated):  not done  Assets:  Desire for Improvement Financial Resources/Insurance Housing Leisure Time  ADL's:  Intact  Cognition: WNL  Sleep:  Fair   Screenings: GAD-7    [place] Office Visit from 03/30/2022 in [facility] Office Visit from 02/22/2022 in [facility] Office Visit from 08/31/2021 in [facility] Office Visit from 08/19/2021 in [facility]  Total GAD-7 Score _1 PHQ2-9    [place] Visit from 04/21/2022 in [facility] Office Visit from 03/30/2022 in [facility] Office Visit from 02/22/2022 in [facility] from 02/14/2022 in [facility] Visit from 11/10/2021 in [facility]  PHQ-2 Total Score _2 0  PHQ-9 Total Score _3 0      [place] Visit from 06/02/2022 in [facility] from 05/29/2022 in [facility] Office Visit from 04/21/2022 in [facility] Error: Q3, 4, or 5 should  not be populated when Q2 is No No Risk No Risk       Assessment and Plan: as follows  Prior documentation reviewed Major depressive disorder recurrent moderate;gets subdued at times but feels wellbutrin does help, will continue Generalized anxiety disorder;gets excessive at times,  Will start small dose of buspar, assign a worry TIMe and distract from negative thoughts  Have been scheduled for therapy as well  Direct care time spent 20 minutes including face to face , chart review Fu 2 m.   Collaboration of Care: Primary Care Provider AEB med and notes reviewed  Patient/Guardian was advised Release of Information must be obtained prior to any record release in order to collaborate their care with an outside provider. Patient/Guardian was advised if they have not already done so to contact the registration department to sign all necessary forms in order for [place] to release information regarding their care.   Consent: Patient/Guardian gives verbal consent for treatment and assignment of benefits for services provided during this visit. Patient/Guardian expressed understanding and agreed to proceed.   Merian Capron, MD 8/3/202310:35 AM

## 2022-06-02 NOTE — Assessment & Plan Note (Signed)
Severe leg pain with statin Will try QOD dose depending on next lipid profile

## 2022-06-02 NOTE — Progress Notes (Signed)
Established Patient Office Visit  Subjective:  Patient ID: Carolyn [name], female    DOB: 10-Jun-1951  Age: 70 y.o. MRN: [id number]  CC:  Chief Complaint  Patient presents with   Follow-up    Patient went to ER 05/29/22 chest pain around to back the pain officially stopped 06-01-22 they said could be multiple things     HPI Carolyn [name] is a 71 y.o. female with past medical history of  HTN, PE, OSA, IBS, DM, HLD, depression and obesity who presents for f/u of recent ER visit.  She went to ER for complaint of chest pain on 07/30.  Her EKG was negative for acute ischemia.  Cardiac enzymes were WNL.  D-dimer was WNL.  She was given Protonix for likely gastritis/GERD.  She states that she has had intermittent chest pain/pressure, but this episode lasted longer, but resolved yesterday.  Of note, she has had cardiac CT done, and her calcium score was 0.  She denies any dyspnea currently.    Past Medical History:  Diagnosis Date   Depression    Diabetes mellitus without complication ([place])    Diastolic dysfunction    Hyperlipidemia    Myasthenia gravis ([place])    Patient is Jehovah's Witness    Pulmonary embolism ([place]) 05/18/2021   Sleep apnea     Past Surgical History:  Procedure Laterality Date   APPENDECTOMY     BIOPSY  11/11/2020   Procedure: BIOPSY;  Surgeon: Harvel Quale, MD;  [facility];  Service: Gastroenterology;;  duodenum gastric   BUNIONECTOMY     COLONOSCOPY WITH PROPOFOL N/A 11/11/2020   Procedure: COLONOSCOPY WITH PROPOFOL;  Surgeon: Harvel Quale, MD;  Location: [facility];  Service: Gastroenterology;  Laterality: N/A;  11:15   ESOPHAGOGASTRODUODENOSCOPY (EGD) WITH PROPOFOL N/A 11/11/2020   Procedure: ESOPHAGOGASTRODUODENOSCOPY (EGD) WITH PROPOFOL;  Surgeon: Harvel Quale, MD;  Location: [facility];  Service: Gastroenterology;  Laterality: N/A;  to arrive at [place], ADENOIDECTOMY,  BILATERAL MYRINGOTOMY AND TUBES      Family History  Problem Relation Age of Onset   Arthritis Mother    Depression Mother    Diabetes Mother    Hypertension Mother    Alcohol abuse Father    Hypertension Father    Heart disease Father 81       Massive MI   Diabetes Brother    Diabetes Brother    Heart disease Brother    Diabetes Brother        Type I     Social History   Socioeconomic History   Marital status: Married    Spouse name: Legrand Como   Number of children: 4   Years of education: some college   Highest education level: Some college, no degree  Occupational History   Occupation: Retired  Tobacco Use   Smoking status: Never    Passive exposure: Never   Smokeless tobacco: Never  Vaping Use   Vaping Use: Never used  Substance and Sexual Activity   Alcohol use: Yes    Comment: socially   Drug use: No   Sexual activity: Yes    Birth control/protection: Other-see comments    Comment: postmenopausal  Other Topics Concern   Not on file  Social History Narrative   Not on file   Social Determinants of Health   Financial Resource Strain: Low Risk  (06/19/2020)   Overall Financial Resource Strain (CARDIA)  Difficulty of Paying Living Expenses: Not very hard  Food Insecurity: No Food Insecurity (02/10/2021)   Hunger Vital Sign    Worried About Running Out of Food in the Last Year: Never true    Ran Out of Food in the Last Year: Never true  Transportation Needs: No Transportation Needs (02/10/2021)   PRAPARE - Administrator, Civil Service (Medical): No    Lack of Transportation (Non-Medical): No  Physical Activity: Insufficiently Active (02/10/2021)   Exercise Vital Sign    Days of Exercise per Week: 2 days    Minutes of Exercise per Session: 20 min  Stress: No Stress Concern Present (02/10/2021)   [employer] of Occupational Health - Occupational Stress Questionnaire    Feeling of Stress : Not at all  Social Connections: Moderately  Integrated (02/10/2021)   Social Connection and Isolation Panel [NHANES]    Frequency of Communication with Friends and Family: More than three times a week    Frequency of Social Gatherings with Friends and Family: Three times a week    Attends Religious Services: More than 4 times per year    Active Member of Clubs or Organizations: No    Attends Banker Meetings: Never    Marital Status: Married  Catering manager Violence: Not At Risk (02/10/2021)   Humiliation, Afraid, Rape, and Kick questionnaire    Fear of Current or Ex-Partner: No    Emotionally Abused: No    Physically Abused: No    Sexually Abused: No    Outpatient Medications Prior to Visit  Medication Sig Dispense Refill   Blood Glucose Monitoring Suppl (BLOOD GLUCOSE SYSTEM PAK) KIT Use as directed to monitor FSBS 2x daily. Dx: E11.9 1 each 1   busPIRone (BUSPAR) 5 MG tablet Take 1 tablet (5 mg total) by mouth daily. 30 tablet 0   Cholecalciferol (VITAMIN D3) 125 MCG (5000 UT) TABS Take 5,000 Units by mouth daily.     dapagliflozin propanediol (FARXIGA) 10 MG TABS tablet Take 1 tablet (10 mg total) by mouth daily before breakfast. 90 tablet 2   glucose blood (ONETOUCH VERIO) test strip USE TO TEST BLOOD SUGAR TWICE DAILY 100 strip 3   Glycerin-Hypromellose-PEG 400 (DRY EYE RELIEF DROPS) 0.2-0.2-1 % SOLN Place 1 drop into both eyes daily as needed (Dry eye).     hydrOXYzine (VISTARIL) 25 MG capsule Take 1 capsule (25 mg total) by mouth at bedtime as needed for anxiety (Insomnia). 30 capsule 0   hyoscyamine (LEVSIN SL) 0.125 MG SL tablet Place 1 tablet (0.125 mg total) under the tongue every 6 (six) hours as needed (spasms). 30 tablet 1   icosapent Ethyl (VASCEPA) 1 g capsule TAKE 1 CAPSULE(1 GRAM) BY MOUTH DAILY 90 capsule 0   ketoconazole (NIZORAL) 2 % cream Apply 1 application topically daily. (Patient taking differently: Apply 1 application  topically daily as needed for irritation.) 15 g 0    losartan-hydrochlorothiazide (HYZAAR) 100-12.5 MG tablet TAKE 1 TABLET BY MOUTH DAILY 30 tablet 3   metoprolol succinate (TOPROL-XL) 25 MG 24 hr tablet TAKE 1 TABLET(25 MG) BY MOUTH DAILY 90 tablet 1   Multiple Vitamins-Minerals (MULTI COMPLETE) CAPS Take 1 capsule by mouth daily.     pantoprazole (PROTONIX) 40 MG tablet Take 1 tablet (40 mg total) by mouth daily. 30 tablet 1   WELLBUTRIN XL 150 MG 24 hr tablet TAKE 1 TABLET BY MOUTH ONCE DAILY (Patient taking differently: Take 150 mg by mouth daily.) 90 tablet 3  No facility-administered medications prior to visit.    Allergies  Allergen Reactions   Prozac [Fluoxetine Hcl] Anaphylaxis   Codeine Other (See Comments)    Intolerance    Other     -mycin medications: has myasthenia gravis    Sulfa Antibiotics Hives    ROS Review of Systems  Constitutional:  Negative for chills and fever.  HENT:  Negative for congestion, sinus pressure, sinus pain and sore throat.   Eyes:  Negative for pain and discharge.  Respiratory:  Negative for cough and shortness of breath.   Cardiovascular:  Negative for chest pain and palpitations.  Gastrointestinal:  Negative for abdominal pain, constipation, diarrhea, nausea and vomiting.  Endocrine: Negative for polydipsia and polyuria.  Genitourinary:  Negative for dysuria and hematuria.  Musculoskeletal:  Negative for neck pain and neck stiffness.  Skin:  Negative for rash.  Neurological:  Negative for dizziness and weakness.  Psychiatric/Behavioral:  Positive for dysphoric mood and sleep disturbance. Negative for agitation and behavioral problems. The patient is nervous/anxious.       Objective:    Physical Exam Vitals reviewed.  Constitutional:      General: She is not in acute distress.    Appearance: She is not diaphoretic.  HENT:     Head: Normocephalic and atraumatic.     Nose: Nose normal.     Mouth/Throat:     Mouth: Mucous membranes are moist.  Eyes:     General: No scleral  icterus.    Extraocular Movements: Extraocular movements intact.  Cardiovascular:     Rate and Rhythm: Normal rate and regular rhythm.     Pulses: Normal pulses.     Heart sounds: Normal heart sounds. No murmur heard. Pulmonary:     Breath sounds: Normal breath sounds. No wheezing or rales.  Musculoskeletal:     Cervical back: Neck supple. No tenderness.     Right lower leg: No edema.     Left lower leg: No edema.  Skin:    General: Skin is warm.     Findings: No rash.  Neurological:     General: No focal deficit present.     Mental Status: She is alert and oriented to person, place, and time.     Sensory: No sensory deficit.     Motor: No weakness.  Psychiatric:        Mood and Affect: Mood is depressed.        Behavior: Behavior is slowed.     BP 122/84 (BP Location: Left Arm, Patient Position: Sitting, Cuff Size: Normal)   Pulse 79   Resp 16   Ht $R'5\' 3"'tL$  (1.6 m)   Wt 193 lb (87.5 kg)   SpO2 98%   BMI 34.19 kg/m  Wt Readings from Last 3 Encounters:  06/02/22 193 lb (87.5 kg)  06/02/22 191 lb (86.6 kg)  05/29/22 190 lb (86.2 kg)    Lab Results  Component Value Date   TSH 2.450 03/30/2022   Lab Results  Component Value Date   WBC 6.5 05/29/2022   HGB 16.6 (H) 05/29/2022   HCT 47.2 (H) 05/29/2022   MCV 92.2 05/29/2022   PLT 242 05/29/2022   Lab Results  Component Value Date   NA 138 05/29/2022   K 3.4 (L) 05/29/2022   CO2 26 05/29/2022   GLUCOSE 202 (H) 05/29/2022   BUN 18 05/29/2022   CREATININE 0.83 05/29/2022   BILITOT 0.7 03/30/2022   ALKPHOS 88 03/30/2022   AST 20 03/30/2022  ALT 20 03/30/2022   PROT 7.0 03/30/2022   ALBUMIN 5.0 (H) 03/30/2022   CALCIUM 9.6 05/29/2022   ANIONGAP 11 05/29/2022   EGFR 74 03/30/2022   Lab Results  Component Value Date   CHOL 149 11/10/2021   Lab Results  Component Value Date   HDL 36 (L) 11/10/2021   Lab Results  Component Value Date   LDLCALC 68 11/10/2021   Lab Results  Component Value Date   TRIG  279 (H) 11/10/2021   Lab Results  Component Value Date   CHOLHDL 4.1 11/10/2021   Lab Results  Component Value Date   HGBA1C 6.9 (H) 03/30/2022      Assessment & Plan:   Problem List Items Addressed This Visit       Digestive   Gastroesophageal reflux disease    On pantoprazole now Chest/epigastric pain could be due to GERD Avoid hot and spicy food        Other   Hyperlipidemia    Lipid profile reviewed She has stopped taking statin as she was having severe leg pain On Vascepa      Statin intolerance    Severe leg pain with statin Will try QOD dose depending on next lipid profile      Encounter for examination following treatment at hospital    ER chart reviewed, including blood tests and imaging Chest pain less likely to be cardiac in etiology      Chest pain - Primary    Could be due to GERD and/or panic episode On pantoprazole now EKG in ER was unremarkable for any acute changes She has had coronary CT in 2020-calcium score was 0       No orders of the defined types were placed in this encounter.   Follow-up: Return if symptoms worsen or fail to improve.    Lindell Spar, MD

## 2022-06-02 NOTE — Assessment & Plan Note (Signed)
Lipid profile reviewed She has stopped taking statin as she was having severe leg pain On Vascepa

## 2022-06-02 NOTE — Assessment & Plan Note (Addendum)
Could be due to GERD and/or panic episode On pantoprazole now EKG in ER was unremarkable for any acute changes She has had coronary CT in 2020-calcium score was 0

## 2022-06-02 NOTE — Assessment & Plan Note (Signed)
ER chart reviewed, including blood tests and imaging Chest pain less likely to be cardiac in etiology

## 2022-06-07 ENCOUNTER — Encounter (INDEPENDENT_AMBULATORY_CARE_PROVIDER_SITE_OTHER): Payer: Self-pay | Admitting: Gastroenterology

## 2022-06-07 ENCOUNTER — Ambulatory Visit (INDEPENDENT_AMBULATORY_CARE_PROVIDER_SITE_OTHER): Payer: Medicare Other | Admitting: Gastroenterology

## 2022-06-07 VITALS — BP 111/76 | HR 73 | Temp 98.1°F | Ht 63.0 in | Wt 192.5 lb

## 2022-06-07 DIAGNOSIS — K641 Second degree hemorrhoids: Secondary | ICD-10-CM

## 2022-06-07 NOTE — Progress Notes (Signed)
   [place] BANDING PROCEDURE NOTE  Carolyn [name] is a 71 y.o. female presenting today for consideration of hemorrhoid banding. Last colonoscopy 11/11/2020  The patient presents with symptomatic grade II hemorrhoids, unresponsive to maximal medical therapy, requesting rubber band ligation of his/her hemorrhoidal disease. She c/o itching and discomfort in rectal area with bowel movements and occasional toilet tissue hematochezia. Currently using Benefiber BID and now having 1-2 BMs per day with less straining than previously, she is limiting toilet time to 5 minutes.   All risks, benefits, and alternative forms of therapy were described and informed consent was obtained.  The decision was made to band the Right Posterior internal hemorrhoid during this initial banding session, and the [place] was used to perform band ligation without complication. Digital anorectal examination was then performed to assure proper positioning of the band, and to adjust the banded tissue as required. The patient was discharged home without pain or other issues. Dietary and behavioral recommendations were given, along with follow-up instructions. The patient will return in several weeks for followup and possible additional banding as required.  No complications were encountered and the patient tolerated the procedure well.   Ledger Heindl L. Alver Sorrow, MSN, APRN, AGNP-C Adult-Gerontology Nurse Practitioner [facility]

## 2022-06-07 NOTE — Patient Instructions (Addendum)
Please continue to avoid straining.  You should limit your toilet time to 2-3 minutes at the most.   Continue to avoid constipation, drink atleast 64 oz of water per day and continue with benefiber 1T two to three times a day with meals.   You may feel rectal pressure and the need to defecate but you should not feel pain or pinching.  Please call me with any concerns or issues!  I will see you in follow-up for additional banding in several weeks.

## 2022-06-09 ENCOUNTER — Encounter (HOSPITAL_COMMUNITY): Payer: Self-pay

## 2022-06-09 ENCOUNTER — Ambulatory Visit (INDEPENDENT_AMBULATORY_CARE_PROVIDER_SITE_OTHER): Payer: Medicare Other | Admitting: Licensed Clinical Social Worker

## 2022-06-09 DIAGNOSIS — F331 Major depressive disorder, recurrent, moderate: Secondary | ICD-10-CM

## 2022-06-09 DIAGNOSIS — F411 Generalized anxiety disorder: Secondary | ICD-10-CM

## 2022-06-09 NOTE — Plan of Care (Signed)
  Problem: Anxiety Disorder CCP Problem  1 increase coping skills, let go of past Goal:  Carolyn [name] will manage mood and anxiety by increasing coping skills for anxious and depressive symptoms, challenge negative thoughts, process and let go of the past, increase physical activity and social interaction for 5 out of 7 days for 60 days.  Outcome: Initial Goal: STG: Patient will practice problem solving skills 3 times per week for the next 4 weeks Outcome: Initial

## 2022-06-10 ENCOUNTER — Telehealth: Payer: Self-pay

## 2022-06-10 NOTE — Telephone Encounter (Signed)
        Patient  visited [place] on 7/30  for chest pain   Telephone encounter attempt :  1st  A HIPAA compliant voice message was left requesting a return call.  Instructed patient to call back at earliest convenience. [place], Care Management  [phone] [address], [place], [place] [address] Phone: [phone] Email: [email or web address]
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
## 2022-06-17 ENCOUNTER — Telehealth: Payer: Self-pay

## 2022-06-17 NOTE — Telephone Encounter (Signed)
     Patient  visit  Carolyn [name] was for chest pain  Have you been able to follow up with your primary care physician?yes  The patient was or was not able to obtain any needed medicine or equipment.yes  Are there diet recommendations that you are having difficulty following? [name]  Patient expresses understanding of discharge instructions and education provided has no other needs at this time. yes     [facility]  [phone] [address], [place], [place] [address] Phone: [phone] Email: [email or web address]

## 2022-06-22 ENCOUNTER — Ambulatory Visit (INDEPENDENT_AMBULATORY_CARE_PROVIDER_SITE_OTHER): Payer: Medicare Other | Admitting: Gastroenterology

## 2022-06-22 ENCOUNTER — Encounter (INDEPENDENT_AMBULATORY_CARE_PROVIDER_SITE_OTHER): Payer: Self-pay | Admitting: Gastroenterology

## 2022-06-22 VITALS — BP 126/85 | HR 103 | Temp 98.4°F | Ht 63.0 in | Wt 192.1 lb

## 2022-06-22 DIAGNOSIS — K641 Second degree hemorrhoids: Secondary | ICD-10-CM

## 2022-06-22 NOTE — Progress Notes (Signed)
    [facility] [facility] BANDING PROCEDURE NOTE  PARI LOMBARD is a 71 y.o. female presenting today for consideration of hemorrhoid banding. Last colonoscopy 11/11/20  The patient presents with symptomatic grade II hemorrhoids, unresponsive to maximal medical therapy, requesting rubber band ligation of his/her hemorrhoidal disease. Initially c/o rectal itching, discomfort with BMs with occasional toilet tissue hematochezia. Using benefiber BID with 1-2 BMs per day and less straining at last OV.   Initial banding: 06/07/22 Right posterior, reports some mild improvement in symptoms thereafter, continues to use benefiber to avoid constipation.   All risks, benefits, and alternative forms of therapy were described and informed consent was obtained.  The decision was made to band the left lateral internal hemorrhoid, and the [place] was used to perform band ligation without complication. Digital anorectal examination was then performed to assure proper positioning of the band, and to adjust the banded tissue as required. The patient was discharged home without pain or other issues. Dietary and behavioral recommendations were given, along with follow-up instructions. The patient will return in several weeks for followup and possible additional banding as required.  No complications were encountered and the patient tolerated the procedure well.   Khamani Fairley L. Alver Sorrow, MSN, APRN, AGNP-C Adult-Gerontology Nurse Practitioner [facility]

## 2022-06-22 NOTE — Patient Instructions (Addendum)
Continue to avoid straining.   Limit toilet time to 2-3 minutes at the most.   Avoid constipation. Take 1T Benefiber three times per day with meals and increase your water intake to 7-8 glasses daily.  Occasionally, you may have more bleeding than usual after the banding procedure. This is often from the untreated hemorrhoids rather than the treated one. Don't be concerned if there is a tablespoon or so of blood. If there is more blood than this, lie flat with your bottom higher than your head and apply an ice pack to the area. If the bleeding does not stop within a half an hour or if you feel faint, have severe pain, chills, fever or difficulty passing urine (very rare) or other problems, you should call us at [phone] or report to the nearest emergency room. Please call me with any concerns!  The procedure you have had should have been relatively painless since the banding of the area involved does not have nerve endings and there is no pain sensation. The rubber band cuts off the blood supply to the hemorrhoid and the band may fall off as soon as 48 hours after the banding (the band may occasionally be seen in the toilet bowl following a bowel movement). You may notice a temporary feeling of fullness in the rectum which should respond adequately to plain Tylenol or Motrin.  I will see you back in a few weeks for follow-up banding.

## 2022-06-24 ENCOUNTER — Other Ambulatory Visit (HOSPITAL_COMMUNITY): Payer: Self-pay

## 2022-06-24 MED ORDER — BUSPIRONE HCL 5 MG PO TABS: 5.0000 mg | ORAL_TABLET | Freq: Every day | ORAL | 1 refills | Status: DC

## 2022-07-01 DIAGNOSIS — S233XXA Sprain of ligaments of thoracic spine, initial encounter: Secondary | ICD-10-CM | POA: Diagnosis not present

## 2022-07-01 DIAGNOSIS — S338XXA Sprain of other parts of lumbar spine and pelvis, initial encounter: Secondary | ICD-10-CM | POA: Diagnosis not present

## 2022-07-01 DIAGNOSIS — M9902 Segmental and somatic dysfunction of thoracic region: Secondary | ICD-10-CM | POA: Diagnosis not present

## 2022-07-01 DIAGNOSIS — S134XXA Sprain of ligaments of cervical spine, initial encounter: Secondary | ICD-10-CM | POA: Diagnosis not present

## 2022-07-01 DIAGNOSIS — M9903 Segmental and somatic dysfunction of lumbar region: Secondary | ICD-10-CM | POA: Diagnosis not present

## 2022-07-01 DIAGNOSIS — M9901 Segmental and somatic dysfunction of cervical region: Secondary | ICD-10-CM | POA: Diagnosis not present

## 2022-07-07 ENCOUNTER — Ambulatory Visit: Payer: Medicare Other | Admitting: Urology

## 2022-07-07 VITALS — BP 118/75 | HR 79

## 2022-07-07 DIAGNOSIS — M9903 Segmental and somatic dysfunction of lumbar region: Secondary | ICD-10-CM | POA: Diagnosis not present

## 2022-07-07 DIAGNOSIS — R3912 Poor urinary stream: Secondary | ICD-10-CM

## 2022-07-07 DIAGNOSIS — S233XXA Sprain of ligaments of thoracic spine, initial encounter: Secondary | ICD-10-CM | POA: Diagnosis not present

## 2022-07-07 DIAGNOSIS — M9901 Segmental and somatic dysfunction of cervical region: Secondary | ICD-10-CM | POA: Diagnosis not present

## 2022-07-07 DIAGNOSIS — N8111 Cystocele, midline: Secondary | ICD-10-CM

## 2022-07-07 DIAGNOSIS — M9902 Segmental and somatic dysfunction of thoracic region: Secondary | ICD-10-CM | POA: Diagnosis not present

## 2022-07-07 DIAGNOSIS — S134XXA Sprain of ligaments of cervical spine, initial encounter: Secondary | ICD-10-CM | POA: Diagnosis not present

## 2022-07-07 LAB — URINALYSIS, ROUTINE W REFLEX MICROSCOPIC
Bilirubin, UA: NEGATIVE
Ketones, UA: NEGATIVE
Nitrite, UA: NEGATIVE
Protein,UA: NEGATIVE
RBC, UA: NEGATIVE
Specific Gravity, UA: <1.005 — ABNORMAL LOW (ref 1.005–1.030)
Urobilinogen, Ur: 0.2 mg/dL (ref 0.2–1.0)
pH, UA: 5.5 (ref 5.0–7.5)

## 2022-07-07 LAB — MICROSCOPIC EXAMINATION
RBC, Urine: NONE SEEN /hpf (ref 0–2)
Renal Epithel, UA: NONE SEEN /hpf

## 2022-07-07 NOTE — Progress Notes (Signed)
Subjective: 1. Weak urinary stream   2. Cystocele, midline      Consult requested by Dr. Ihor Dow  9/7/23Adonis [name] returns today in f/u.  She has been practicing the postural toileting changes that I recommended and is doing better.  Her PVR remains low at 59ml and her PF is 72ml/sec with a steady stream.    04/07/22: Carolyn [name] is a 71 yo female who is sent for a progressively weakening urinary stream.   She notices it with every void.  She has some frequency but tries to wait two hours but can't always.  She doesn't have urgency. She has had some MUI in the past but not currently.   She has nocturia 1-2x. She doesn't feel like she empties and has some intermittency.  Her PVR is 9ml.  Her last UA in 2/23 was clear but had glucose.  She is on [place].  She has had UTI's in the past but none recently.  She has had no stones.  She has had no GU surgery.  She has some issues with constipation. She has a 15 year history of myasthenia gravis which is controlled at this time.  She has no diabetic neuropathy.  She occasionally takes hydroxyzine for sleep.   She has some mild prolapse symptoms.  She is G4 P4 NVD.  ROS:  Review of Systems  Constitutional:  Positive for malaise/fatigue.  Gastrointestinal:  Positive for constipation, diarrhea, heartburn and nausea.  Musculoskeletal:  Positive for back pain and joint pain.  Skin:  Positive for rash.  Neurological:  Positive for dizziness and headaches.  Psychiatric/Behavioral:  Positive for depression. The patient is nervous/anxious.   All other systems reviewed and are negative.   Allergies  Allergen Reactions   Prozac [Fluoxetine Hcl] Anaphylaxis   Codeine Other (See Comments)    Intolerance    Other     -mycin medications: has myasthenia gravis    Sulfa Antibiotics Hives    Past Medical History:  Diagnosis Date   Depression    Diabetes mellitus without complication ([place])    Diastolic dysfunction    Hyperlipidemia    Myasthenia gravis  ([place])    Patient is Jehovah's Witness    Pulmonary embolism ([place]) 05/18/2021   Sleep apnea     Past Surgical History:  Procedure Laterality Date   APPENDECTOMY     BIOPSY  11/11/2020   Procedure: BIOPSY;  Surgeon: Harvel Quale, MD;  [facility];  Service: Gastroenterology;;  duodenum gastric   BUNIONECTOMY     COLONOSCOPY WITH PROPOFOL N/A 11/11/2020   Procedure: COLONOSCOPY WITH PROPOFOL;  Surgeon: Harvel Quale, MD;  [facility];  Service: Gastroenterology;  Laterality: N/A;  11:15   ESOPHAGOGASTRODUODENOSCOPY (EGD) WITH PROPOFOL N/A 11/11/2020   Procedure: ESOPHAGOGASTRODUODENOSCOPY (EGD) WITH PROPOFOL;  Surgeon: Harvel Quale, MD;  Location: [facility];  Service: Gastroenterology;  Laterality: N/A;  to arrive at [place], ADENOIDECTOMY, BILATERAL MYRINGOTOMY AND TUBES      Social History   Socioeconomic History   Marital status: Married    Spouse name: Legrand Como   Number of children: 4   Years of education: some college   Highest education level: Some college, no degree  Occupational History   Occupation: Retired  Tobacco Use   Smoking status: Never    Passive exposure: Never   Smokeless tobacco: Never  Vaping Use   Vaping Use: Never used  Substance and Sexual  Activity   Alcohol use: Yes    Comment: socially   Drug use: No   Sexual activity: Yes    Birth control/protection: Other-see comments    Comment: postmenopausal  Other Topics Concern   Not on file  Social History Narrative   Not on file   Social Determinants of Health   Financial Resource Strain: Low Risk  (06/19/2020)   Overall Financial Resource Strain (CARDIA)    Difficulty of Paying Living Expenses: Not very hard  Food Insecurity: No Food Insecurity (02/10/2021)   Hunger Vital Sign    Worried About Running Out of Food in the Last Year: Never true    Ran Out of Food in the Last Year: Never true   Transportation Needs: No Transportation Needs (02/10/2021)   PRAPARE - Hydrologist (Medical): No    Lack of Transportation (Non-Medical): No  Physical Activity: Insufficiently Active (02/10/2021)   Exercise Vital Sign    Days of Exercise per Week: 2 days    Minutes of Exercise per Session: 20 min  Stress: No Stress Concern Present (02/10/2021)   [place]    Feeling of Stress : Not at all  Social Connections: Moderately Integrated (02/10/2021)   Social Connection and Isolation Panel [NHANES]    Frequency of Communication with Friends and Family: More than three times a week    Frequency of Social Gatherings with Friends and Family: Three times a week    Attends Religious Services: More than 4 times per year    Active Member of Clubs or Organizations: No    Attends Archivist Meetings: Never    Marital Status: Married  Human resources officer Violence: Not At Risk (02/10/2021)   Humiliation, Afraid, Rape, and Kick questionnaire    Fear of Current or Ex-Partner: No    Emotionally Abused: No    Physically Abused: No    Sexually Abused: No    Family History  Problem Relation Age of Onset   Arthritis Mother    Depression Mother    Diabetes Mother    Hypertension Mother    Alcohol abuse Father    Hypertension Father    Heart disease Father 48       Massive MI   Diabetes Brother    Diabetes Brother    Heart disease Brother    Diabetes Brother        Type I     Anti-infectives: Anti-infectives (From admission, onward)    None       Current Outpatient Medications  Medication Sig Dispense Refill   Blood Glucose Monitoring Suppl (BLOOD GLUCOSE SYSTEM PAK) KIT Use as directed to monitor FSBS 2x daily. Dx: E11.9 1 each 1   busPIRone (BUSPAR) 5 MG tablet Take 1 tablet (5 mg total) by mouth daily. 30 tablet 1   Cholecalciferol (VITAMIN D3) 125 MCG (5000 UT) TABS Take 5,000 Units by  mouth daily.     dapagliflozin propanediol (FARXIGA) 10 MG TABS tablet Take 1 tablet (10 mg total) by mouth daily before breakfast. 90 tablet 2   glucose blood (ONETOUCH VERIO) test strip USE TO TEST BLOOD SUGAR TWICE DAILY 100 strip 3   Glycerin-Hypromellose-PEG 400 (DRY EYE RELIEF DROPS) 0.2-0.2-1 % SOLN Place 1 drop into both eyes daily as needed (Dry eye).     hyoscyamine (LEVSIN SL) 0.125 MG SL tablet Place 1 tablet (0.125 mg total) under the tongue every 6 (six) hours as needed (  spasms). 30 tablet 1   icosapent Ethyl (VASCEPA) 1 g capsule TAKE 1 CAPSULE(1 GRAM) BY MOUTH DAILY 90 capsule 0   losartan-hydrochlorothiazide (HYZAAR) 100-12.5 MG tablet TAKE 1 TABLET BY MOUTH DAILY 30 tablet 3   metoprolol succinate (TOPROL-XL) 25 MG 24 hr tablet TAKE 1 TABLET(25 MG) BY MOUTH DAILY 90 tablet 1   Multiple Vitamins-Minerals (MULTI COMPLETE) CAPS Take 1 capsule by mouth daily.     pantoprazole (PROTONIX) 40 MG tablet Take 1 tablet (40 mg total) by mouth daily. 30 tablet 1   WELLBUTRIN XL 150 MG 24 hr tablet TAKE 1 TABLET BY MOUTH ONCE DAILY (Patient taking differently: Take 150 mg by mouth daily.) 90 tablet 3   No current facility-administered medications for this visit.     Objective: Vital signs in last 24 hours: BP 118/75   Pulse 79   Intake/Output from previous day: No intake/output data recorded. Intake/Output this shift: @IOTHISSHIFT @   Physical Exam Vitals reviewed.  Constitutional:      Appearance: Normal appearance.  Neurological:     Mental Status: She is alert.     Lab Results:  Results for orders placed or performed in visit on 07/07/22 (from the past 24 hour(s))  Urinalysis, Routine w reflex microscopic     Status: Abnormal   Collection Time: 07/07/22 10:22 AM  Result Value Ref Range   Specific Gravity, UA <1.005 (L) 1.005 - 1.030   pH, UA 5.5 5.0 - 7.5   Color, UA Yellow Yellow   Appearance Ur Clear Clear   Leukocytes,UA Trace (A) Negative   Protein,UA Negative  Negative/Trace   Glucose, UA 3+ (A) Negative   Ketones, UA Negative Negative   RBC, UA Negative Negative   Bilirubin, UA Negative Negative   Urobilinogen, Ur 0.2 0.2 - 1.0 mg/dL   Nitrite, UA Negative Negative   Microscopic Examination See below:    Narrative   Performed at:  [address] - [facility] [address] [place], [place]  [place] Lab Director: [id number], Phone:  [phone]  Microscopic Examination     Status: Abnormal   Collection Time: 07/07/22 10:22 AM   Urine  Result Value Ref Range   WBC, UA 0-5 0 - 5 /hpf   RBC, Urine None seen 0 - 2 /hpf   Epithelial Cells (non renal) 0-10 0 - 10 /hpf   Renal Epithel, UA None seen None seen /hpf   Mucus, UA Present Not Estab.   Bacteria, UA Few (A) None seen/Few   Narrative   Performed at:  [address] - [facility] [address], [place], [place]  [place] Lab Director: [id number], Phone:  [phone]     BMET No results for input(s): "NA", "K", "CL", "CO2", "GLUCOSE", "BUN", "CREATININE", "CALCIUM" in the last 72 hours. PT/INR No results for input(s): "LABPROT", "INR" in the last 72 hours. ABG No results for input(s): "PHART", "HCO3" in the last 72 hours.  Invalid input(s): "PCO2", "PO2" UA is unremarkable.  Studies/Results: No results found.   Assessment/Plan: Weak urinary stream with a cystocele that could be kinking the urethra.  She is voiding better with the postural changes and empties well.  F/u in a year for a PVR.  Vaginal atrophy and small rectocele.  No treatment needed at this time.    No orders of the defined types were placed in this encounter.    Orders Placed This Encounter  Procedures   Microscopic Examination   Urinalysis, Routine w reflex microscopic  BLADDER SCAN AMB NON-IMAGING     Return in about 1 year (around 07/08/2023) for with PVR.    CC: Dr. Ihor Dow.      Irine Seal 07/08/2022 [phone] ID: Arther Abbott, female   DOB: [birth year], 71  y.o.   MRN: [id number]

## 2022-07-07 NOTE — Progress Notes (Signed)
Uroflow  Peak Flow: 76m Average Flow: 681mVoided Volume: 21667moiding Time: 34sec Flow Time: 34sec Time to Peak Flow: 1sec  PVR Volume: 2ml23m

## 2022-07-08 ENCOUNTER — Encounter: Payer: Self-pay | Admitting: Urology

## 2022-07-11 ENCOUNTER — Ambulatory Visit (INDEPENDENT_AMBULATORY_CARE_PROVIDER_SITE_OTHER): Payer: Medicare Other | Admitting: Gastroenterology

## 2022-07-12 DIAGNOSIS — S233XXA Sprain of ligaments of thoracic spine, initial encounter: Secondary | ICD-10-CM | POA: Diagnosis not present

## 2022-07-12 DIAGNOSIS — S338XXA Sprain of other parts of lumbar spine and pelvis, initial encounter: Secondary | ICD-10-CM | POA: Diagnosis not present

## 2022-07-12 DIAGNOSIS — M9902 Segmental and somatic dysfunction of thoracic region: Secondary | ICD-10-CM | POA: Diagnosis not present

## 2022-07-12 DIAGNOSIS — S134XXA Sprain of ligaments of cervical spine, initial encounter: Secondary | ICD-10-CM | POA: Diagnosis not present

## 2022-07-12 DIAGNOSIS — M9903 Segmental and somatic dysfunction of lumbar region: Secondary | ICD-10-CM | POA: Diagnosis not present

## 2022-07-12 DIAGNOSIS — M9901 Segmental and somatic dysfunction of cervical region: Secondary | ICD-10-CM | POA: Diagnosis not present

## 2022-07-16 ENCOUNTER — Other Ambulatory Visit: Payer: Self-pay | Admitting: Internal Medicine

## 2022-07-16 DIAGNOSIS — I10 Essential (primary) hypertension: Secondary | ICD-10-CM

## 2022-07-19 ENCOUNTER — Other Ambulatory Visit: Payer: Self-pay | Admitting: Internal Medicine

## 2022-07-19 DIAGNOSIS — S134XXA Sprain of ligaments of cervical spine, initial encounter: Secondary | ICD-10-CM | POA: Diagnosis not present

## 2022-07-19 DIAGNOSIS — M9903 Segmental and somatic dysfunction of lumbar region: Secondary | ICD-10-CM | POA: Diagnosis not present

## 2022-07-19 DIAGNOSIS — S233XXA Sprain of ligaments of thoracic spine, initial encounter: Secondary | ICD-10-CM | POA: Diagnosis not present

## 2022-07-19 DIAGNOSIS — I10 Essential (primary) hypertension: Secondary | ICD-10-CM

## 2022-07-19 DIAGNOSIS — S338XXA Sprain of other parts of lumbar spine and pelvis, initial encounter: Secondary | ICD-10-CM | POA: Diagnosis not present

## 2022-07-19 DIAGNOSIS — M9901 Segmental and somatic dysfunction of cervical region: Secondary | ICD-10-CM | POA: Diagnosis not present

## 2022-07-19 DIAGNOSIS — M9902 Segmental and somatic dysfunction of thoracic region: Secondary | ICD-10-CM | POA: Diagnosis not present

## 2022-07-20 DIAGNOSIS — L820 Inflamed seborrheic keratosis: Secondary | ICD-10-CM | POA: Diagnosis not present

## 2022-07-20 DIAGNOSIS — L259 Unspecified contact dermatitis, unspecified cause: Secondary | ICD-10-CM | POA: Diagnosis not present

## 2022-07-21 ENCOUNTER — Ambulatory Visit (INDEPENDENT_AMBULATORY_CARE_PROVIDER_SITE_OTHER): Payer: Medicare Other | Admitting: Licensed Clinical Social Worker

## 2022-07-21 DIAGNOSIS — F331 Major depressive disorder, recurrent, moderate: Secondary | ICD-10-CM | POA: Diagnosis not present

## 2022-07-22 NOTE — Progress Notes (Signed)
   THERAPIST PROGRESS NOTE  Session Time: 9:00 am-9:45 am  Type of Therapy: Individual Therapy  Session#1  Purpose of Session: Yazlynn will manage mood and anxiety by increasing coping skills for anxious and depressive symptoms, challenge negative thoughts, process and let go of the past, increase physical activity and social interaction for 5 out of 7 days for 60 days.  ProgressTowards Goals: Initial  Interventions: Therapist utilized CBT and Solution focused brief therapy to address mood and anxiety. Therapist provided support and empathy to patient during session. Therapist explored patient's feelings of grief related to changes in her life.   Effectiveness: Patient was oriented x4 (person, place, situation, and time). Patient was alert, engaged, pleasant, and cooperative. Patient was casually dressed, and appropriately groomed. Patient's mood has been up and down. She has felt down recently but the past several days she has felt good. Patient worries about her husbands health. His mother passed from dementia and she can see symptoms of dementia in some of his behavior. Patient feels like life has changed for her and she is changing too. She feels like they will eventually move to [place] to live by their daughter. Patient cares for her husband and engages in activities that he enjoys such as golf. Patient used to enjoy reading but doesn't enjoy it as much. Patient is engaged in her church and her faith have a positive impact on her mood. Patient volunteers and has bible studies with those who are interested. She enjoys this most of the time but depressed mood impact her motivation to do it at times.  Patient engaged in session. She responded well to interventions. Patient continues to meet criteria for Major depressive disorder, recurrent episode, moderate and Generalized Anxiety Disorder. Patient will continue in outpatient therapy due to being the least restrictive service to meet her  needs. Patient made minimal progress on her goals at this time.   Suicidal/Homicidal: Nowithout intent/plan  Plan: Return again in 2-4 weeks.  Diagnosis: MDD (major depressive disorder), recurrent episode, moderate ([place])  Collaboration of Care: Psychiatrist AEB Dr. Merian Capron  Patient/Guardian was advised Release of Information must be obtained prior to any record release in order to collaborate their care with an outside provider. Patient/Guardian was advised if they have not already done so to contact the registration department to sign all necessary forms in order for [place] to release information regarding their care.   Consent: Patient/Guardian gives verbal consent for treatment and assignment of benefits for services provided during this visit. Patient/Guardian expressed understanding and agreed to proceed.   Glori Bickers, LCSW 07/22/2022

## 2022-07-28 ENCOUNTER — Ambulatory Visit: Payer: Medicare Other | Admitting: Podiatry

## 2022-07-28 ENCOUNTER — Encounter: Payer: Self-pay | Admitting: Podiatry

## 2022-07-28 DIAGNOSIS — L600 Ingrowing nail: Secondary | ICD-10-CM | POA: Diagnosis not present

## 2022-07-28 NOTE — Progress Notes (Signed)
Subjective:   Patient ID: Carolyn [name], female   DOB: 71 y.o.   MRN: [id number]   HPI Patient presents with chronic ingrown toenail of the big toes of both feet medial border stating they have been sore and making it hard to wear shoe gear comfortably.  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  I infiltrated the hallux each toe 60 mg like Marcaine mixture sterile prep done using sterile instrumentation remove the medial borders exposed matrix applied phenol 3 applications 30 seconds followed by alcohol lavage sterile dressing reviewed instructions on soaks and to wear dressings next 24 hours but take them off earlier if throbbing were to occur

## 2022-07-28 NOTE — Patient Instructions (Signed)

## 2022-08-02 ENCOUNTER — Ambulatory Visit (INDEPENDENT_AMBULATORY_CARE_PROVIDER_SITE_OTHER): Payer: Medicare Other | Admitting: Internal Medicine

## 2022-08-02 ENCOUNTER — Encounter: Payer: Self-pay | Admitting: Internal Medicine

## 2022-08-02 VITALS — BP 138/88 | HR 83 | Resp 18 | Ht 63.0 in | Wt 192.4 lb

## 2022-08-02 DIAGNOSIS — Z23 Encounter for immunization: Secondary | ICD-10-CM | POA: Diagnosis not present

## 2022-08-02 DIAGNOSIS — R079 Chest pain, unspecified: Secondary | ICD-10-CM | POA: Diagnosis not present

## 2022-08-02 DIAGNOSIS — F339 Major depressive disorder, recurrent, unspecified: Secondary | ICD-10-CM | POA: Diagnosis not present

## 2022-08-02 DIAGNOSIS — E559 Vitamin D deficiency, unspecified: Secondary | ICD-10-CM | POA: Diagnosis not present

## 2022-08-02 DIAGNOSIS — E782 Mixed hyperlipidemia: Secondary | ICD-10-CM | POA: Diagnosis not present

## 2022-08-02 DIAGNOSIS — Z0001 Encounter for general adult medical examination with abnormal findings: Secondary | ICD-10-CM | POA: Diagnosis not present

## 2022-08-02 DIAGNOSIS — E1169 Type 2 diabetes mellitus with other specified complication: Secondary | ICD-10-CM

## 2022-08-02 MED ORDER — ROSUVASTATIN CALCIUM 10 MG PO TABS: 10.0000 mg | ORAL_TABLET | Freq: Every day | ORAL | 1 refills | Status: DC

## 2022-08-02 NOTE — Assessment & Plan Note (Addendum)
Lab Results  Component Value Date   HGBA1C 6.9 (H) 03/30/2022   Associated with HLD and HTN Well-controlled On Farxiga, did not tolerate Metformin Advised to follow diabetic diet On ARB and statin

## 2022-08-02 NOTE — Assessment & Plan Note (Signed)
Physical exam as documented. Fasting blood tests today. Flu vaccine today. Has had Shingrix #1 at pharmacy, advised to take second dose and Tdap as well.

## 2022-08-02 NOTE — Assessment & Plan Note (Signed)
[place] Office Visit from 08/02/2022 in [facility]  PHQ-9 Total Score 13     Uncontrolled with Wellbutrin Does not take BuSpar regularly as it only helps with sleep Had severe anxiety and insomnia with Zoloft 50 mg QD, discontinued Did not have benefit with Lexapro in the past Followed by psychiatry in [place]

## 2022-08-02 NOTE — Patient Instructions (Signed)
Please start taking Crestor as prescribed.  Please continue to take other medications as prescribed.  Please continue to follow low carb diet and ambulate as tolerated.  Please consider getting Tdap vaccine when you get second dose of Shingrix vaccine.

## 2022-08-02 NOTE — Progress Notes (Signed)
Established Patient Office Visit  Subjective:  Patient ID: Carolyn [name], female    DOB: 06/03/1951  Age: 71 y.o. MRN: [id number]  CC:  Chief Complaint  Patient presents with   Annual Exam    Annual exam     HPI Carolyn [name] is a 71 y.o. female with past medical history of HTN, PE, OSA, IBS, DM, HLD, depression and obesity who presents for annual physical.  HTN: BP is well-controlled. Takes medications regularly. Patient denies headache, dizziness, chest pain, dyspnea or palpitations. She reports family history of CAD and requests cardiac calcium scoring test.  Type II DM: She takes [place] for it.  Denies any polyuria or polyphagia currently. She has not been taking statin for HLD, but still takes Vascepa.  Depression: She complains of anhedonia, which has been worse recently.  She denies any change in home environment.  Denies any concern for domestic abuse.  She also reports fatigue, insomnia, agitation, lack of interest in activities and decreased concentration.  She also reports crying spells at times. She denies any SI or HI currently.  She is currently taking Wellbutrin 150 mg daily. She is followed by Psychiatry and [facility] therapy currently.      Past Medical History:  Diagnosis Date   Depression    Diabetes mellitus without complication ([place])    Diastolic dysfunction    Hyperlipidemia    Myasthenia gravis ([place])    Patient is Jehovah's Witness    Pulmonary embolism ([place]) 05/18/2021   Sleep apnea     Past Surgical History:  Procedure Laterality Date   APPENDECTOMY     BIOPSY  11/11/2020   Procedure: BIOPSY;  Surgeon: Harvel Quale, MD;  Location: [facility];  Service: Gastroenterology;;  duodenum gastric   BUNIONECTOMY     COLONOSCOPY WITH PROPOFOL N/A 11/11/2020   Procedure: COLONOSCOPY WITH PROPOFOL;  Surgeon: Harvel Quale, MD;  Location: [facility];  Service: Gastroenterology;  Laterality: N/A;  11:15   ESOPHAGOGASTRODUODENOSCOPY  (EGD) WITH PROPOFOL N/A 11/11/2020   Procedure: ESOPHAGOGASTRODUODENOSCOPY (EGD) WITH PROPOFOL;  Surgeon: Harvel Quale, MD;  Location: [facility];  Service: Gastroenterology;  Laterality: N/A;  to arrive at [place], ADENOIDECTOMY, BILATERAL MYRINGOTOMY AND TUBES      Family History  Problem Relation Age of Onset   Arthritis Mother    Depression Mother    Diabetes Mother    Hypertension Mother    Alcohol abuse Father    Hypertension Father    Heart disease Father 69       Massive MI   Diabetes Brother    Diabetes Brother    Heart disease Brother    Diabetes Brother        Type I     Social History   Socioeconomic History   Marital status: Married    Spouse name: Legrand Como   Number of children: 4   Years of education: some college   Highest education level: Some college, no degree  Occupational History   Occupation: Retired  Tobacco Use   Smoking status: Never    Passive exposure: Never   Smokeless tobacco: Never  Vaping Use   Vaping Use: Never used  Substance and Sexual Activity   Alcohol use: Yes    Comment: socially   Drug use: No   Sexual activity: Yes    Birth control/protection: Other-see comments    Comment: postmenopausal  Other Topics Concern  Not on file  Social History Narrative   Not on file   Social Determinants of Health   Financial Resource Strain: Low Risk  (06/19/2020)   Overall Financial Resource Strain (CARDIA)    Difficulty of Paying Living Expenses: Not very hard  Food Insecurity: No Food Insecurity (02/10/2021)   Hunger Vital Sign    Worried About Running Out of Food in the Last Year: Never true    Ran Out of Food in the Last Year: Never true  Transportation Needs: No Transportation Needs (02/10/2021)   PRAPARE - Administrator, Civil Service (Medical): No    Lack of Transportation (Non-Medical): No  Physical Activity: Insufficiently Active (02/10/2021)   Exercise Vital Sign     Days of Exercise per Week: 2 days    Minutes of Exercise per Session: 20 min  Stress: No Stress Concern Present (02/10/2021)   [employer] of Occupational Health - Occupational Stress Questionnaire    Feeling of Stress : Not at all  Social Connections: Moderately Integrated (02/10/2021)   Social Connection and Isolation Panel [NHANES]    Frequency of Communication with Friends and Family: More than three times a week    Frequency of Social Gatherings with Friends and Family: Three times a week    Attends Religious Services: More than 4 times per year    Active Member of Clubs or Organizations: No    Attends Banker Meetings: Never    Marital Status: Married  Catering manager Violence: Not At Risk (02/10/2021)   Humiliation, Afraid, Rape, and Kick questionnaire    Fear of Current or Ex-Partner: No    Emotionally Abused: No    Physically Abused: No    Sexually Abused: No    Outpatient Medications Prior to Visit  Medication Sig Dispense Refill   Blood Glucose Monitoring Suppl (BLOOD GLUCOSE SYSTEM PAK) KIT Use as directed to monitor FSBS 2x daily. Dx: E11.9 1 each 1   busPIRone (BUSPAR) 5 MG tablet Take 1 tablet (5 mg total) by mouth daily. 30 tablet 1   Cholecalciferol (VITAMIN D3) 125 MCG (5000 UT) TABS Take 5,000 Units by mouth daily.     FARXIGA 10 MG TABS tablet TAKE 1 TABLET(10 MG) BY MOUTH DAILY BEFORE BREAKFAST 90 tablet 2   glucose blood (ONETOUCH VERIO) test strip USE TO TEST BLOOD SUGAR TWICE DAILY 100 strip 3   Glycerin-Hypromellose-PEG 400 (DRY EYE RELIEF DROPS) 0.2-0.2-1 % SOLN Place 1 drop into both eyes daily as needed (Dry eye).     hyoscyamine (LEVSIN SL) 0.125 MG SL tablet Place 1 tablet (0.125 mg total) under the tongue every 6 (six) hours as needed (spasms). 30 tablet 1   icosapent Ethyl (VASCEPA) 1 g capsule TAKE 1 CAPSULE(1 GRAM) BY MOUTH DAILY 90 capsule 0   losartan-hydrochlorothiazide (HYZAAR) 100-12.5 MG tablet TAKE 1 TABLET BY MOUTH DAILY 30  tablet 3   metoprolol succinate (TOPROL-XL) 25 MG 24 hr tablet TAKE 1 TABLET(25 MG) BY MOUTH DAILY 90 tablet 1   Multiple Vitamins-Minerals (MULTI COMPLETE) CAPS Take 1 capsule by mouth daily.     WELLBUTRIN XL 150 MG 24 hr tablet TAKE 1 TABLET BY MOUTH ONCE DAILY (Patient taking differently: Take 150 mg by mouth daily.) 90 tablet 3   pantoprazole (PROTONIX) 40 MG tablet Take 1 tablet (40 mg total) by mouth daily. 30 tablet 1   No facility-administered medications prior to visit.    Allergies  Allergen Reactions   Prozac [Fluoxetine Hcl] Anaphylaxis  Codeine Other (See Comments)    Intolerance    Erythromycin Other (See Comments)   Other     -mycin medications: has myasthenia gravis    Sulfa Antibiotics Hives    ROS Review of Systems  Constitutional:  Negative for chills and fever.  HENT:  Negative for congestion, sinus pressure, sinus pain and sore throat.   Eyes:  Negative for pain and discharge.  Respiratory:  Negative for cough and shortness of breath.   Cardiovascular:  Negative for chest pain and palpitations.  Gastrointestinal:  Negative for abdominal pain, constipation, diarrhea, nausea and vomiting.  Endocrine: Negative for polydipsia and polyuria.  Genitourinary:  Negative for dysuria and hematuria.  Musculoskeletal:  Negative for neck pain and neck stiffness.  Skin:  Negative for rash.  Neurological:  Negative for dizziness and weakness.  Psychiatric/Behavioral:  Positive for dysphoric mood and sleep disturbance. Negative for agitation and behavioral problems. The patient is nervous/anxious.       Objective:    Physical Exam Vitals reviewed.  Constitutional:      General: She is not in acute distress.    Appearance: She is obese. She is not diaphoretic.  HENT:     Head: Normocephalic and atraumatic.     Nose: Nose normal.     Mouth/Throat:     Mouth: Mucous membranes are moist.  Eyes:     General: No scleral icterus.    Extraocular Movements:  Extraocular movements intact.  Cardiovascular:     Rate and Rhythm: Normal rate and regular rhythm.     Pulses: Normal pulses.     Heart sounds: Normal heart sounds. No murmur heard. Pulmonary:     Breath sounds: Normal breath sounds. No wheezing or rales.  Abdominal:     Palpations: Abdomen is soft.     Tenderness: There is no abdominal tenderness.  Musculoskeletal:     Cervical back: Neck supple. No tenderness.     Right lower leg: No edema.     Left lower leg: No edema.  Skin:    General: Skin is warm.     Findings: No rash.  Neurological:     General: No focal deficit present.     Mental Status: She is alert and oriented to person, place, and time.     Sensory: No sensory deficit.     Motor: No weakness.  Psychiatric:        Mood and Affect: Mood is depressed.        Behavior: Behavior is slowed.     BP 138/88 (BP Location: Right Arm, Patient Position: Sitting, Cuff Size: Normal)   Pulse 83   Resp 18   Ht $R'5\' 3"'sn$  (1.6 m)   Wt 192 lb 6.4 oz (87.3 kg)   SpO2 96%   BMI 34.08 kg/m  Wt Readings from Last 3 Encounters:  08/02/22 192 lb 6.4 oz (87.3 kg)  06/22/22 192 lb 1.6 oz (87.1 kg)  06/07/22 192 lb 8 oz (87.3 kg)    Lab Results  Component Value Date   TSH 2.450 03/30/2022   Lab Results  Component Value Date   WBC 6.5 05/29/2022   HGB 16.6 (H) 05/29/2022   HCT 47.2 (H) 05/29/2022   MCV 92.2 05/29/2022   PLT 242 05/29/2022   Lab Results  Component Value Date   NA 138 05/29/2022   K 3.4 (L) 05/29/2022   CO2 26 05/29/2022   GLUCOSE 202 (H) 05/29/2022   BUN 18 05/29/2022   CREATININE 0.83 05/29/2022  BILITOT 0.7 03/30/2022   ALKPHOS 88 03/30/2022   AST 20 03/30/2022   ALT 20 03/30/2022   PROT 7.0 03/30/2022   ALBUMIN 5.0 (H) 03/30/2022   CALCIUM 9.6 05/29/2022   ANIONGAP 11 05/29/2022   EGFR 74 03/30/2022   Lab Results  Component Value Date   CHOL 149 11/10/2021   Lab Results  Component Value Date   HDL 36 (L) 11/10/2021   Lab Results   Component Value Date   LDLCALC 68 11/10/2021   Lab Results  Component Value Date   TRIG 279 (H) 11/10/2021   Lab Results  Component Value Date   CHOLHDL 4.1 11/10/2021   Lab Results  Component Value Date   HGBA1C 6.9 (H) 03/30/2022      Assessment & Plan:   Problem List Items Addressed This Visit       Endocrine   Diabetes mellitus, type II ([place])    Lab Results  Component Value Date   HGBA1C 6.9 (H) 03/30/2022  Associated with HLD and HTN Well-controlled On Farxiga, did not tolerate Metformin Advised to follow diabetic diet On ARB and statin      Relevant Medications   rosuvastatin (CRESTOR) 10 MG tablet   Other Relevant Orders   Hemoglobin A1c   CMP14+EGFR     Other   Depression, recurrent ([place])    [place] Office Visit from 08/02/2022 in [facility]  PHQ-9 Total Score 13  Uncontrolled with Wellbutrin Does not take BuSpar regularly as it only helps with sleep Had severe anxiety and insomnia with Zoloft 50 mg QD, discontinued Did not have benefit with Lexapro in the past Followed by psychiatry in [place]      Relevant Orders   TSH   CBC with Differential/Platelet   Hyperlipidemia   Relevant Medications   rosuvastatin (CRESTOR) 10 MG tablet   Other Relevant Orders   Lipid panel   Chest pain   Relevant Orders   CT CARDIAC SCORING (SELF PAY ONLY)   Encounter for general adult medical examination with abnormal findings - Primary    Physical exam as documented. Fasting blood tests today. Flu vaccine today. Has had Shingrix #1 at pharmacy, advised to take second dose and Tdap as well.      Relevant Orders   CMP14+EGFR   CBC with Differential/Platelet   Other Visit Diagnoses     Vitamin D deficiency       Relevant Orders   VITAMIN D 25 Hydroxy (Vit-D Deficiency, Fractures)   Need for immunization against influenza       Relevant Orders   Flu Vaccine QUAD High Dose(Fluad) (Completed)       Meds ordered this encounter   Medications   rosuvastatin (CRESTOR) 10 MG tablet    Sig: Take 1 tablet (10 mg total) by mouth daily.    Dispense:  90 tablet    Refill:  1    Follow-up: Return in about 4 months (around 12/03/2022) for DM and HTN.    Lindell Spar, MD

## 2022-08-03 LAB — CMP14+EGFR
ALT: 25 IU/L (ref 0–32)
AST: 21 IU/L (ref 0–40)
Albumin/Globulin Ratio: 2 (ref 1.2–2.2)
Albumin: 4.9 g/dL (ref 3.9–4.9)
Alkaline Phosphatase: 82 IU/L (ref 44–121)
BUN/Creatinine Ratio: 16 (ref 12–28)
BUN: 13 mg/dL (ref 8–27)
Bilirubin Total: 0.8 mg/dL (ref 0.0–1.2)
CO2: 25 mmol/L (ref 20–29)
Calcium: 10.2 mg/dL (ref 8.7–10.3)
Chloride: 99 mmol/L (ref 96–106)
Creatinine, Ser: 0.82 mg/dL (ref 0.57–1.00)
Globulin, Total: 2.5 g/dL (ref 1.5–4.5)
Glucose: 117 mg/dL — ABNORMAL HIGH (ref 70–99)
Potassium: 4.1 mmol/L (ref 3.5–5.2)
Sodium: 144 mmol/L (ref 134–144)
Total Protein: 7.4 g/dL (ref 6.0–8.5)
eGFR: 77 mL/min/{1.73_m2} (ref >59)

## 2022-08-03 LAB — HEMOGLOBIN A1C
Est. average glucose Bld gHb Est-mCnc: 177 mg/dL
Hgb A1c MFr Bld: 7.8 % — ABNORMAL HIGH (ref 4.8–5.6)

## 2022-08-03 LAB — CBC WITH DIFFERENTIAL/PLATELET
Basophils Absolute: 0.1 10*3/uL (ref 0.0–0.2)
Basos: 1 %
EOS (ABSOLUTE): 0.1 10*3/uL (ref 0.0–0.4)
Eos: 2 %
Hematocrit: 48.6 % — ABNORMAL HIGH (ref 34.0–46.6)
Hemoglobin: 16.6 g/dL — ABNORMAL HIGH (ref 11.1–15.9)
Immature Grans (Abs): 0 10*3/uL (ref 0.0–0.1)
Immature Granulocytes: 0 %
Lymphocytes Absolute: 2.4 10*3/uL (ref 0.7–3.1)
Lymphs: 35 %
MCH: 32 pg (ref 26.6–33.0)
MCHC: 34.2 g/dL (ref 31.5–35.7)
MCV: 94 fL (ref 79–97)
Monocytes Absolute: 0.6 10*3/uL (ref 0.1–0.9)
Monocytes: 8 %
Neutrophils Absolute: 3.7 10*3/uL (ref 1.4–7.0)
Neutrophils: 54 %
Platelets: 244 10*3/uL (ref 150–450)
RBC: 5.19 x10E6/uL (ref 3.77–5.28)
RDW: 12.4 % (ref 11.7–15.4)
WBC: 6.8 10*3/uL (ref 3.4–10.8)

## 2022-08-03 LAB — LIPID PANEL
Chol/HDL Ratio: 5.6 ratio — ABNORMAL HIGH (ref 0.0–4.4)
Cholesterol, Total: 246 mg/dL — ABNORMAL HIGH (ref 100–199)
HDL: 44 mg/dL (ref >39)
LDL Chol Calc (NIH): 127 mg/dL — ABNORMAL HIGH (ref 0–99)
Triglycerides: 422 mg/dL — ABNORMAL HIGH (ref 0–149)
VLDL Cholesterol Cal: 75 mg/dL — ABNORMAL HIGH (ref 5–40)

## 2022-08-03 LAB — TSH: TSH: 2.57 u[IU]/mL (ref 0.450–4.500)

## 2022-08-03 LAB — VITAMIN D 25 HYDROXY (VIT D DEFICIENCY, FRACTURES): Vit D, 25-Hydroxy: 43.1 ng/mL (ref 30.0–100.0)

## 2022-08-04 ENCOUNTER — Ambulatory Visit (INDEPENDENT_AMBULATORY_CARE_PROVIDER_SITE_OTHER): Payer: Medicare Other | Admitting: Psychiatry

## 2022-08-04 ENCOUNTER — Encounter (HOSPITAL_COMMUNITY): Payer: Self-pay | Admitting: Psychiatry

## 2022-08-04 VITALS — BP 130/82 | Temp 98.3°F | Ht 63.0 in | Wt 190.0 lb

## 2022-08-04 DIAGNOSIS — F331 Major depressive disorder, recurrent, moderate: Secondary | ICD-10-CM

## 2022-08-04 DIAGNOSIS — F411 Generalized anxiety disorder: Secondary | ICD-10-CM | POA: Diagnosis not present

## 2022-08-04 MED ORDER — BUPROPION HCL ER (SR) 200 MG PO TB12: 200.0000 mg | ORAL_TABLET | Freq: Two times a day (BID) | ORAL | 1 refills | Status: DC

## 2022-08-04 NOTE — Progress Notes (Signed)
[place] Follow up visit  Patient Identification: Carolyn [name] MRN:  [id number] Date of Evaluation:  08/04/2022 Referral Source: primary care Chief Complaint:   No chief complaint on file.  Visit Diagnosis:    ICD-10-CM   1. MDD (major depressive disorder), recurrent episode, moderate (HCC)  F33.1     2. GAD (generalized anxiety disorder)  F41.1       History of Present Illness:  71 years ol married white female, initially referred by pcp to establish care for depression, anxiety, currently Retired,    Has had depression for since age 71"s and has been treated with different medications, of all wellbutrin has been consistent in helping Prozac has caused allergic reaction , not sure of what other meds used Patient is currently retired her stressors include her husband is sick with 1 kidney.   Last visit added buspar for anxiety, made her sleep, she doesn't want to continue  Taking some Hemopathic med for anxiety now  Overall subdued, feels amotivated, worries related to medical health and labs    Aggravating factor: medical co morbidities, some difficult childhood, covid , retirement,  husband sick with one kidney Modifying factor: husband, Joava witness   Severity gets subdued Duration all adult life   Past Psychiatric History: depression, anxiety  Previous Psychotropic Medications: Yes   Substance Abuse History in the last 12 months:  No.  Consequences of Substance Abuse: NA  Past Medical History:  Past Medical History:  Diagnosis Date   Depression    Diabetes mellitus without complication ([place])    Diastolic dysfunction    Hyperlipidemia    Myasthenia gravis ([place])    Patient is Jehovah's Witness    Pulmonary embolism ([place]) 05/18/2021   Sleep apnea     Past Surgical History:  Procedure Laterality Date   APPENDECTOMY     BIOPSY  11/11/2020   Procedure: BIOPSY;  Surgeon: Harvel Quale, MD;  [facility];  Service: Gastroenterology;;   duodenum gastric   BUNIONECTOMY     COLONOSCOPY WITH PROPOFOL N/A 11/11/2020   Procedure: COLONOSCOPY WITH PROPOFOL;  Surgeon: Harvel Quale, MD;  [facility];  Service: Gastroenterology;  Laterality: N/A;  11:15   ESOPHAGOGASTRODUODENOSCOPY (EGD) WITH PROPOFOL N/A 11/11/2020   Procedure: ESOPHAGOGASTRODUODENOSCOPY (EGD) WITH PROPOFOL;  Surgeon: Harvel Quale, MD;  [facility];  Service: Gastroenterology;  Laterality: N/A;  to arrive at [place], ADENOIDECTOMY, BILATERAL MYRINGOTOMY AND TUBES      Family Psychiatric History: son; bipolar,  Mom : depression Dad ; alcohol use  Family History:  Family History  Problem Relation Age of Onset   Arthritis Mother    Depression Mother    Diabetes Mother    Hypertension Mother    Alcohol abuse Father    Hypertension Father    Heart disease Father 51       Massive MI   Diabetes Brother    Diabetes Brother    Heart disease Brother    Diabetes Brother        Type I     Social History:   Social History   Socioeconomic History   Marital status: Married    Spouse name: Legrand Como   Number of children: 4   Years of education: some college   Highest education level: Some college, no degree  Occupational History   Occupation: Retired  Tobacco Use   Smoking status: Never    Passive exposure:  Never   Smokeless tobacco: Never  Vaping Use   Vaping Use: Never used  Substance and Sexual Activity   Alcohol use: Yes    Comment: socially   Drug use: No   Sexual activity: Yes    Birth control/protection: Other-see comments    Comment: postmenopausal  Other Topics Concern   Not on file  Social History Narrative   Not on file   Social Determinants of Health   Financial Resource Strain: Low Risk  (06/19/2020)   Overall Financial Resource Strain (CARDIA)    Difficulty of Paying Living Expenses: Not very hard  Food Insecurity: No Food Insecurity (02/10/2021)    Hunger Vital Sign    Worried About Running Out of Food in the Last Year: Never true    Ran Out of Food in the Last Year: Never true  Transportation Needs: No Transportation Needs (02/10/2021)   PRAPARE - Hydrologist (Medical): No    Lack of Transportation (Non-Medical): No  Physical Activity: Insufficiently Active (02/10/2021)   Exercise Vital Sign    Days of Exercise per Week: 2 days    Minutes of Exercise per Session: 20 min  Stress: No Stress Concern Present (02/10/2021)   [place]    Feeling of Stress : Not at all  Social Connections: Moderately Integrated (02/10/2021)   Social Connection and Isolation Panel [NHANES]    Frequency of Communication with Friends and Family: More than three times a week    Frequency of Social Gatherings with Friends and Family: Three times a week    Attends Religious Services: More than 4 times per year    Active Member of Clubs or Organizations: No    Attends Archivist Meetings: Never    Marital Status: Married    Additional Social History: grew up with parents, dad was alcoholic, some challenges  Married for 79 years now  Allergies:   Allergies  Allergen Reactions   Prozac [Fluoxetine Hcl] Anaphylaxis   Codeine Other (See Comments)    Intolerance    Erythromycin Other (See Comments)   Other     -mycin medications: has myasthenia gravis    Sulfa Antibiotics Hives    Metabolic Disorder Labs: Lab Results  Component Value Date   HGBA1C 7.8 (H) 08/02/2022   MPG 148 12/28/2020   MPG 148 08/24/2020   No results found for: "PROLACTIN" Lab Results  Component Value Date   CHOL 246 (H) 08/02/2022   TRIG 422 (H) 08/02/2022   HDL 44 08/02/2022   CHOLHDL 5.6 (H) 08/02/2022   LDLCALC 127 (H) 08/02/2022   [place] 68 11/10/2021   Lab Results  Component Value Date   TSH 2.570 08/02/2022    Therapeutic Level Labs: No results found  for: "LITHIUM" No results found for: "CBMZ" No results found for: "VALPROATE"  Current Medications: Current Outpatient Medications  Medication Sig Dispense Refill   Blood Glucose Monitoring Suppl (BLOOD GLUCOSE SYSTEM PAK) KIT Use as directed to monitor FSBS 2x daily. Dx: E11.9 1 each 1   buPROPion (WELLBUTRIN SR) 200 MG 12 hr tablet Take 1 tablet (200 mg total) by mouth 2 (two) times daily. 30 tablet 1   busPIRone (BUSPAR) 5 MG tablet Take 1 tablet (5 mg total) by mouth daily. 30 tablet 1   Cholecalciferol (VITAMIN D3) 125 MCG (5000 UT) TABS Take 5,000 Units by mouth daily.     FARXIGA 10 MG TABS tablet TAKE 1 TABLET(10  MG) BY MOUTH DAILY BEFORE BREAKFAST 90 tablet 2   glucose blood (ONETOUCH VERIO) test strip USE TO TEST BLOOD SUGAR TWICE DAILY 100 strip 3   Glycerin-Hypromellose-PEG 400 (DRY EYE RELIEF DROPS) 0.2-0.2-1 % SOLN Place 1 drop into both eyes daily as needed (Dry eye).     hyoscyamine (LEVSIN SL) 0.125 MG SL tablet Place 1 tablet (0.125 mg total) under the tongue every 6 (six) hours as needed (spasms). 30 tablet 1   icosapent Ethyl (VASCEPA) 1 g capsule TAKE 1 CAPSULE(1 GRAM) BY MOUTH DAILY 90 capsule 0   losartan-hydrochlorothiazide (HYZAAR) 100-12.5 MG tablet TAKE 1 TABLET BY MOUTH DAILY 30 tablet 3   metoprolol succinate (TOPROL-XL) 25 MG 24 hr tablet TAKE 1 TABLET(25 MG) BY MOUTH DAILY 90 tablet 1   Multiple Vitamins-Minerals (MULTI COMPLETE) CAPS Take 1 capsule by mouth daily.     pantoprazole (PROTONIX) 40 MG tablet Take 1 tablet (40 mg total) by mouth daily. 30 tablet 1   rosuvastatin (CRESTOR) 10 MG tablet Take 1 tablet (10 mg total) by mouth daily. 90 tablet 1   No current facility-administered medications for this visit.    Psychiatric Specialty Exam: Review of Systems  Cardiovascular:  Negative for chest pain.  Neurological:  Negative for tremors.  Psychiatric/Behavioral:  Positive for dysphoric mood. Negative for agitation and self-injury.     Blood pressure  130/82, temperature 98.3 F (36.8 C), height _0  (1.6 m), weight 190 lb (86.2 kg).Body mass index is 33.66 kg/m.  General Appearance: Casual  Eye Contact:  Fair  Speech:  Clear and Coherent  Volume:  Normal  Moodsomewhat subdued   Affect:  Congruent  Thought Process:  Goal Directed  Orientation:  Full (Time, Place, and Person)  Thought Content:  Rumination  Suicidal Thoughts:  No  Homicidal Thoughts:  No  Memory:  Immediate;   Fair  Judgement:  Fair  Insight:  Fair  Psychomotor Activity:  Normal  Concentration:  Concentration: Fair  Recall:  [place]: Good  Akathisia:  No  Handed:    AIMS (if indicated):  not done  Assets:  Desire for Improvement Financial Resources/Insurance Housing Leisure Time  ADL's:  Intact  Cognition: WNL  Sleep:  Fair   Screenings: GAD-7    Flowsheet Row Office Visit from 03/30/2022 in [facility] Office Visit from 02/22/2022 in [facility] Office Visit from 08/31/2021 in [facility] Office Visit from 08/19/2021 in [facility]  Total GAD-7 Score _1 PHQ2-9    [place] Visit from 08/02/2022 in [facility] Counselor from 06/09/2022 in [facility] Office Visit from 06/02/2022 in [facility] Office Visit from 04/21/2022 in [facility] Office Visit from 03/30/2022 in [facility]  PHQ-2 Total Score _2 PHQ-9 Total Score _3 [place] Office Visit from 08/04/2022 in [facility] Counselor from 06/09/2022 in [facility] Office Visit from 06/02/2022 in [facility] No Risk No Risk Error: Q3, 4, or 5 should not be populated when Q2 is No       Assessment and Plan: as follows  Prior  documentation reviewed Major depressive disorder recurrent moderate; gets subdued, increase wellbutrin to 253m , it will SR  form then Add activities as she understand her diabetes and medical co morbidity need more active excerxice regiments  Generalized anxiety disorder; at times excessive but feel depression need addressed, will increase wellbutrin  Takes Hemophathic med for anxiety , not sedating  Have been scheduled for therapy as well Fu 6 w Direct care time spent 20 minutes including face to face , chart review   Merian Capron, MD 10/5/202310:22 AM

## 2022-08-10 ENCOUNTER — Ambulatory Visit (HOSPITAL_COMMUNITY): Payer: Medicare Other | Admitting: Licensed Clinical Social Worker

## 2022-08-11 DIAGNOSIS — S233XXA Sprain of ligaments of thoracic spine, initial encounter: Secondary | ICD-10-CM | POA: Diagnosis not present

## 2022-08-11 DIAGNOSIS — S134XXA Sprain of ligaments of cervical spine, initial encounter: Secondary | ICD-10-CM | POA: Diagnosis not present

## 2022-08-11 DIAGNOSIS — S338XXA Sprain of other parts of lumbar spine and pelvis, initial encounter: Secondary | ICD-10-CM | POA: Diagnosis not present

## 2022-08-11 DIAGNOSIS — M9902 Segmental and somatic dysfunction of thoracic region: Secondary | ICD-10-CM | POA: Diagnosis not present

## 2022-08-11 DIAGNOSIS — M9903 Segmental and somatic dysfunction of lumbar region: Secondary | ICD-10-CM | POA: Diagnosis not present

## 2022-08-11 DIAGNOSIS — M9901 Segmental and somatic dysfunction of cervical region: Secondary | ICD-10-CM | POA: Diagnosis not present

## 2022-08-15 ENCOUNTER — Telehealth: Payer: Self-pay | Admitting: *Deleted

## 2022-08-15 DIAGNOSIS — M9902 Segmental and somatic dysfunction of thoracic region: Secondary | ICD-10-CM | POA: Diagnosis not present

## 2022-08-15 DIAGNOSIS — S233XXA Sprain of ligaments of thoracic spine, initial encounter: Secondary | ICD-10-CM | POA: Diagnosis not present

## 2022-08-15 DIAGNOSIS — S338XXA Sprain of other parts of lumbar spine and pelvis, initial encounter: Secondary | ICD-10-CM | POA: Diagnosis not present

## 2022-08-15 DIAGNOSIS — S134XXA Sprain of ligaments of cervical spine, initial encounter: Secondary | ICD-10-CM | POA: Diagnosis not present

## 2022-08-15 DIAGNOSIS — M9901 Segmental and somatic dysfunction of cervical region: Secondary | ICD-10-CM | POA: Diagnosis not present

## 2022-08-15 DIAGNOSIS — M9903 Segmental and somatic dysfunction of lumbar region: Secondary | ICD-10-CM | POA: Diagnosis not present

## 2022-08-15 NOTE — Telephone Encounter (Signed)
Toe is not healed in area above the nail, is red and puffy, some drainage, will continue to soak in epsom salts and keep covered until further physician's recommendations. Can an antibiotic be sent to pharmacy? Please advise.

## 2022-08-17 ENCOUNTER — Other Ambulatory Visit: Payer: Self-pay | Admitting: Podiatry

## 2022-08-17 MED ORDER — DOXYCYCLINE HYCLATE 100 MG PO TABS: 100.0000 mg | ORAL_TABLET | Freq: Two times a day (BID) | ORAL | 0 refills | Status: DC

## 2022-08-17 NOTE — Telephone Encounter (Signed)
Called in antibiotic. Please check on her

## 2022-08-18 DIAGNOSIS — M9903 Segmental and somatic dysfunction of lumbar region: Secondary | ICD-10-CM | POA: Diagnosis not present

## 2022-08-18 DIAGNOSIS — M9902 Segmental and somatic dysfunction of thoracic region: Secondary | ICD-10-CM | POA: Diagnosis not present

## 2022-08-18 DIAGNOSIS — S233XXA Sprain of ligaments of thoracic spine, initial encounter: Secondary | ICD-10-CM | POA: Diagnosis not present

## 2022-08-18 DIAGNOSIS — S134XXA Sprain of ligaments of cervical spine, initial encounter: Secondary | ICD-10-CM | POA: Diagnosis not present

## 2022-08-18 DIAGNOSIS — M9901 Segmental and somatic dysfunction of cervical region: Secondary | ICD-10-CM | POA: Diagnosis not present

## 2022-08-18 DIAGNOSIS — S338XXA Sprain of other parts of lumbar spine and pelvis, initial encounter: Secondary | ICD-10-CM | POA: Diagnosis not present

## 2022-08-18 NOTE — Telephone Encounter (Signed)
Called patient and she is doing better since switching to vinegar and water and did pick up prescription.

## 2022-08-22 ENCOUNTER — Other Ambulatory Visit: Payer: Self-pay | Admitting: Family Medicine

## 2022-08-22 ENCOUNTER — Other Ambulatory Visit: Payer: Self-pay | Admitting: *Deleted

## 2022-08-22 MED ORDER — ONETOUCH VERIO VI STRP: ORAL_STRIP | 3 refills | Status: DC

## 2022-08-25 ENCOUNTER — Encounter (INDEPENDENT_AMBULATORY_CARE_PROVIDER_SITE_OTHER): Payer: Self-pay | Admitting: Gastroenterology

## 2022-08-25 ENCOUNTER — Ambulatory Visit (INDEPENDENT_AMBULATORY_CARE_PROVIDER_SITE_OTHER): Payer: Medicare Other | Admitting: Gastroenterology

## 2022-08-25 VITALS — BP 116/81 | HR 65 | Temp 98.2°F | Ht 63.0 in | Wt 191.9 lb

## 2022-08-25 DIAGNOSIS — K581 Irritable bowel syndrome with constipation: Secondary | ICD-10-CM

## 2022-08-25 DIAGNOSIS — S134XXA Sprain of ligaments of cervical spine, initial encounter: Secondary | ICD-10-CM | POA: Diagnosis not present

## 2022-08-25 DIAGNOSIS — K5900 Constipation, unspecified: Secondary | ICD-10-CM

## 2022-08-25 DIAGNOSIS — M9902 Segmental and somatic dysfunction of thoracic region: Secondary | ICD-10-CM | POA: Diagnosis not present

## 2022-08-25 DIAGNOSIS — K641 Second degree hemorrhoids: Secondary | ICD-10-CM

## 2022-08-25 DIAGNOSIS — M9901 Segmental and somatic dysfunction of cervical region: Secondary | ICD-10-CM | POA: Diagnosis not present

## 2022-08-25 DIAGNOSIS — K219 Gastro-esophageal reflux disease without esophagitis: Secondary | ICD-10-CM

## 2022-08-25 DIAGNOSIS — M9903 Segmental and somatic dysfunction of lumbar region: Secondary | ICD-10-CM | POA: Diagnosis not present

## 2022-08-25 DIAGNOSIS — S233XXA Sprain of ligaments of thoracic spine, initial encounter: Secondary | ICD-10-CM | POA: Diagnosis not present

## 2022-08-25 DIAGNOSIS — S338XXA Sprain of other parts of lumbar spine and pelvis, initial encounter: Secondary | ICD-10-CM | POA: Diagnosis not present

## 2022-08-25 NOTE — Progress Notes (Addendum)
Referring Provider: Lindell Spar, MD Primary Care Physician:  Lindell Spar, MD Primary GI Physician: Jenetta Downer   Chief Complaint  Patient presents with   Constipation    Follow up on constipation. Taking benefiber taking a heaping spoonful 2 -3 times a day. Was having BM every day and then will go a couple of days without one and take a stool softner.    HPI:   Carolyn [name] is a 71 y.o. female with past medical history of DM, depression HLD, MG and IBS  Patient presenting today for follow up of constipation.  Patient underwent hemorrhoid banding x2 in august.  Last OV in July for hemorrhoids and constipation for years, going 2 days without a BM. Doing fruits, veggies and magnesium. Having to strain quite a bit, with hemorrhoids that prolapse and retract on their own. Taking stool softener 1-2x/week with small amount of toilet tissue hematochezia. Intermittent rectal pain.   Recommended benefiber 1T TID, desitin topical in anal area, levsin q6h, avoid straining and limit toilet time.   Present: States she is taking 1 heaping tablespoon of benefiber 2-3x/day. States that she was going to the restroom everyday though recently had to take stool softener as she went 2 days without a BM. Water intake is pretty good. She reports PCP wants her on a low carb diet, otherwise no real changes. She notes that hemorrhoids are feeling better. She has an upcoming third banding appt that she needs to reschedule. Denies rectal bleeding. Very rare rectal pain if she cannot defecate.   She notes that recently certain foods she previously tolerated have been making her have abdominal discomfort such as peanut butter and meats. Pain is in mid abdomen around umbilicus. She denies any diarrhea, has occasional nausea. Denies any recent tick bites. Sometimes has issues with milk products causing pain as well. Tried low FODMAP diet in the past but had trouble following it. She denies any changes in appetite  or weight loss. She has levsin for her rectal pain previously thought to be anal spasms which works well for her, she does not need this very often. She has not tried taking this for her abdominal pain.   She is maintained on protonix $RemoveBef'40mg'ZXmZVMuSFO$  as needed 3-4x/week. She is concerned about having to continue taking PPI and interested in TIF procedure.   No red flag symptoms. Patient denies melena, hematochezia, nausea, vomiting, diarrhea, dysphagia, odyonophagia, early satiety or weight loss.   Last EGD: 11/11/2020, there was presence of some gastric erosions in the antrum (biopsies taken to rule out H. pylori which were negative, no presence of GIM or dysplasia), normal duodenum (negative for celiac disease). Last Colonoscopy: 11/11/2020, normal colon with presence of internal hemorrhoids  Past Medical History:  Diagnosis Date   Depression    Diabetes mellitus without complication ([place])    Diastolic dysfunction    Hyperlipidemia    Myasthenia gravis ([place])    Patient is Jehovah's Witness    Pulmonary embolism ([place]) 05/18/2021   Sleep apnea     Past Surgical History:  Procedure Laterality Date   APPENDECTOMY     BIOPSY  11/11/2020   Procedure: BIOPSY;  Surgeon: Harvel Quale, MD;  [facility];  Service: Gastroenterology;;  duodenum gastric   BUNIONECTOMY     COLONOSCOPY WITH PROPOFOL N/A 11/11/2020   Procedure: COLONOSCOPY WITH PROPOFOL;  Surgeon: Harvel Quale, MD;  [facility];  Service: Gastroenterology;  Laterality: N/A;  11:15  ESOPHAGOGASTRODUODENOSCOPY (EGD) WITH PROPOFOL N/A 11/11/2020   Procedure: ESOPHAGOGASTRODUODENOSCOPY (EGD) WITH PROPOFOL;  Surgeon: Harvel Quale, MD;  [facility];  Service: Gastroenterology;  Laterality: N/A;  to arrive at [place], ADENOIDECTOMY, BILATERAL MYRINGOTOMY AND TUBES      Current Outpatient Medications  Medication Sig Dispense Refill   Blood  Glucose Monitoring Suppl (BLOOD GLUCOSE SYSTEM PAK) KIT Use as directed to monitor FSBS 2x daily. Dx: E11.9 1 each 1   buPROPion (WELLBUTRIN SR) 200 MG 12 hr tablet Take 1 tablet (200 mg total) by mouth 2 (two) times daily. 30 tablet 1   Cholecalciferol (VITAMIN D3) 125 MCG (5000 UT) TABS Take 5,000 Units by mouth daily.     FARXIGA 10 MG TABS tablet TAKE 1 TABLET(10 MG) BY MOUTH DAILY BEFORE BREAKFAST 90 tablet 2   glucose blood (ONETOUCH VERIO) test strip USE TO TEST BLOOD SUGAR TWICE DAILY 100 strip 3   glucose blood (ONETOUCH VERIO) test strip USE TO TEST BLOOD SUGAR TWICE DAILY 100 strip 3   Glycerin-Hypromellose-PEG 400 (DRY EYE RELIEF DROPS) 0.2-0.2-1 % SOLN Place 1 drop into both eyes daily as needed (Dry eye).     hyoscyamine (LEVSIN SL) 0.125 MG SL tablet Place 1 tablet (0.125 mg total) under the tongue every 6 (six) hours as needed (spasms). 30 tablet 1   icosapent Ethyl (VASCEPA) 1 g capsule TAKE 1 CAPSULE(1 GRAM) BY MOUTH DAILY 90 capsule 0   losartan-hydrochlorothiazide (HYZAAR) 100-12.5 MG tablet TAKE 1 TABLET BY MOUTH DAILY 30 tablet 3   metoprolol succinate (TOPROL-XL) 25 MG 24 hr tablet TAKE 1 TABLET(25 MG) BY MOUTH DAILY 90 tablet 1   Multiple Vitamins-Minerals (MULTI COMPLETE) CAPS Take 1 capsule by mouth daily.     pantoprazole (PROTONIX) 40 MG tablet Take 1 tablet (40 mg total) by mouth daily. 30 tablet 1   rosuvastatin (CRESTOR) 10 MG tablet Take 1 tablet (10 mg total) by mouth daily. 90 tablet 1   busPIRone (BUSPAR) 5 MG tablet Take 1 tablet (5 mg total) by mouth daily. (Patient not taking: Reported on 08/25/2022) 30 tablet 1   No current facility-administered medications for this visit.    Allergies as of 08/25/2022 - Review Complete 08/25/2022  Allergen Reaction Noted   Prozac [fluoxetine hcl] Anaphylaxis 12/16/2015   Codeine Other (See Comments) 12/16/2015   Erythromycin Other (See Comments) 12/16/2015   Other  10/30/2017   Sulfa antibiotics Hives 12/16/2015     Family History  Problem Relation Age of Onset   Arthritis Mother    Depression Mother    Diabetes Mother    Hypertension Mother    Alcohol abuse Father    Hypertension Father    Heart disease Father 49       Massive MI   Diabetes Brother    Diabetes Brother    Heart disease Brother    Diabetes Brother        Type I     Social History   Socioeconomic History   Marital status: Married    Spouse name: Legrand Como   Number of children: 4   Years of education: some college   Highest education level: Some college, no degree  Occupational History   Occupation: Retired  Tobacco Use   Smoking status: Never    Passive exposure: Never   Smokeless tobacco: Never  Vaping Use   Vaping Use: Never used  Substance and Sexual Activity   Alcohol use: Yes  Comment: socially   Drug use: No   Sexual activity: Yes    Birth control/protection: Other-see comments    Comment: postmenopausal  Other Topics Concern   Not on file  Social History Narrative   Not on file   Social Determinants of Health   Financial Resource Strain: Low Risk  (06/19/2020)   Overall Financial Resource Strain (CARDIA)    Difficulty of Paying Living Expenses: Not very hard  Food Insecurity: No Food Insecurity (02/10/2021)   Hunger Vital Sign    Worried About Running Out of Food in the Last Year: Never true    Ran Out of Food in the Last Year: Never true  Transportation Needs: No Transportation Needs (02/10/2021)   PRAPARE - Hydrologist (Medical): No    Lack of Transportation (Non-Medical): No  Physical Activity: Insufficiently Active (02/10/2021)   Exercise Vital Sign    Days of Exercise per Week: 2 days    Minutes of Exercise per Session: 20 min  Stress: No Stress Concern Present (02/10/2021)   [place]    Feeling of Stress : Not at all  Social Connections: Moderately Integrated (02/10/2021)   Social  Connection and Isolation Panel [NHANES]    Frequency of Communication with Friends and Family: More than three times a week    Frequency of Social Gatherings with Friends and Family: Three times a week    Attends Religious Services: More than 4 times per year    Active Member of Clubs or Organizations: No    Attends Archivist Meetings: Never    Marital Status: Married    Review of systems General: negative for malaise, night sweats, fever, chills, weight los Neck: Negative for lumps, goiter, pain and significant neck swelling Resp: Negative for cough, wheezing, dyspnea at rest CV: Negative for chest pain, leg swelling, palpitations, orthopnea GI: denies melena, hematochezia, nausea, vomiting, diarrhea, dysphagia, odyonophagia, early satiety or unintentional weight loss. +constipation +abdominal pain  MSK: Negative for joint pain or swelling, back pain, and muscle pain. Derm: Negative for itching or rash Psych: Denies depression, anxiety, memory loss, confusion. No homicidal or suicidal ideation.  Heme: Negative for prolonged bleeding, bruising easily, and swollen nodes. Endocrine: Negative for cold or heat intolerance, polyuria, polydipsia and goiter. Neuro: negative for tremor, gait imbalance, syncope and seizures. The remainder of the review of systems is noncontributory.  Physical Exam: BP 116/81 (BP Location: Left Arm, Patient Position: Sitting, Cuff Size: Large)   Pulse 65   Temp 98.2 F (36.8 C) (Oral)   Ht $R'5\' 3"'AS$  (1.6 m)   Wt 191 lb 14.4 oz (87 kg)   BMI 33.99 kg/m  General:   Alert and oriented. No distress noted. Pleasant and cooperative.  Head:  Normocephalic and atraumatic. Eyes:  Conjuctiva clear without scleral icterus. Mouth:  Oral mucosa pink and moist. Good dentition. No lesions. Heart: Normal rate and rhythm, s1 and s2 heart sounds present.  Lungs: Clear lung sounds in all lobes. Respirations equal and unlabored. Abdomen:  +BS, soft, non-tender and  non-distended. No rebound or guarding. No HSM or masses noted. Derm: No palmar erythema or jaundice Msk:  Symmetrical without gross deformities. Normal posture. Extremities:  Without edema. Neurologic:  Alert and  oriented x4 Psych:  Alert and cooperative. Normal mood and affect.  Invalid input(s): "6 MONTHS"   ASSESSMENT: DARNELLE DERRICK is a 71 y.o. female presenting today for follow up of constipation.  Constipation overall doing relatively well with 1T benefiber BID-TID, though recently did take laxative as she went 2 days without a BM. Denies rectal bleeding, has very rare rectal pain, uses levsin PRN for previously suspected anorectal spasm which provides good results. I encouraged her to increase her water, aim for 64 oz per day, continue with benefiber at current dose, avoid straining and limit toilet time. Can use miralax if she is going more than 2 days without a BM or finding that stools are harder.   Certain foods cause abdominal discomfort around umbilicus. She denies any diarrhea, has occasional nausea. Issues sometimes with dairy, certain meats and peanut butter. Previously advised to do low FODMAP but had trouble following, would recommend trying low FODMAP again. Advised she can use levsin for abdominal pain as well. She should keep a food journal to correlate symptoms with certain trigger foods.  She denies any changes in appetite or weight loss.   She is maintained on protonix $RemoveBef'40mg'XEXWFSxAtw$ , taking as needed 3-4x/week. Discussed that this may provide better results taking on more schedule dosing to prevent symptoms such as every other day to every day depending on the control it provides. She is concerned about having to continue taking PPI and interested in TIF procedure. We discussed this briefly, brochure was provided to her, she will let us know if she would like to discuss the procedure more in depth.    PLAN:  Continue benefiber 1T BID to TID  2. Good water intake 3. Low fodmap  diet and food journal 4. Levsin PRN for abdominal pain 5. TIF brochure.  6. Miralax if more than 2 days without BM  All questions were answered, patient verbalized understanding and is in agreement with plan as outlined above.    Follow Up: 6 months   Rodrickus Min L. Alver Sorrow, MSN, APRN, AGNP-C Adult-Gerontology Nurse Practitioner [facility]  I have reviewed the note and agree with the APP's assessment as described in this progress note  [name], MD Gastroenterology and Hepatology [facility]

## 2022-08-25 NOTE — Patient Instructions (Signed)
Continue benefiber 1 tablespoon 2-3x/day, make sure water intake is good, atleast 64 ounces per day. Continue to avoid straining and limit toilet time. If you are finding this is not keeping stools soft and regular, you can Start taking Miralax 1 capful every day for one week. If bowel movements do not improve, increase to 1 capful every 12 hours. If after two weeks there is no improvement, increase to 1 capful every 8 hours  I am providing the Low fodmap diet and recommend keeping a symptom/food journal in regards to abdominal pain to help pinpoint certain triggers  You can use the hyosciamine for abdominal pain if you feel that it is bothersome to you   Please continue with protonix as needed, however, keep in mind that this medication typically works best when taken on a regular basis, every day to every other day. I have provided the TIF brochure for you to read over, please let us know if you are interested in this.  Follow up 6 months

## 2022-08-29 ENCOUNTER — Ambulatory Visit (HOSPITAL_COMMUNITY)
Admission: RE | Admit: 2022-08-29 | Discharge: 2022-08-29 | Disposition: A | Discharge status: Home / Self Care | Payer: Medicare Other | Source: Ambulatory Visit | Attending: Internal Medicine | Admitting: Internal Medicine

## 2022-08-29 ENCOUNTER — Other Ambulatory Visit: Payer: Self-pay | Admitting: Internal Medicine

## 2022-08-29 DIAGNOSIS — R079 Chest pain, unspecified: Secondary | ICD-10-CM | POA: Insufficient documentation

## 2022-08-31 ENCOUNTER — Ambulatory Visit (INDEPENDENT_AMBULATORY_CARE_PROVIDER_SITE_OTHER): Payer: Medicare Other | Admitting: Licensed Clinical Social Worker

## 2022-08-31 ENCOUNTER — Other Ambulatory Visit (INDEPENDENT_AMBULATORY_CARE_PROVIDER_SITE_OTHER): Payer: Self-pay | Admitting: Gastroenterology

## 2022-08-31 ENCOUNTER — Telehealth: Payer: Self-pay

## 2022-08-31 DIAGNOSIS — F331 Major depressive disorder, recurrent, moderate: Secondary | ICD-10-CM | POA: Diagnosis not present

## 2022-08-31 MED ORDER — PANTOPRAZOLE SODIUM 40 MG PO TBEC
40.0000 mg | DELAYED_RELEASE_TABLET | Freq: Every day | ORAL | 3 refills | Status: DC
Start: 2022-08-31 — End: 2023-12-18

## 2022-08-31 NOTE — Telephone Encounter (Signed)
Patient made aware of all.  

## 2022-08-31 NOTE — Telephone Encounter (Signed)
Needs Pantoprazole 40 mg daily sent to [facility] [address].She was seen by [facility] on 08/25/2022 and needs rx for this as the last time it was filled was by ed doctor.

## 2022-09-01 ENCOUNTER — Encounter (INDEPENDENT_AMBULATORY_CARE_PROVIDER_SITE_OTHER): Payer: Medicare Other | Admitting: Gastroenterology

## 2022-09-01 NOTE — Progress Notes (Signed)
   THERAPIST PROGRESS NOTE  Session Time: 10:00 am-10:45 am  Type of Therapy: Individual Therapy  Session#2  Purpose of Session: Rosey will manage mood and anxiety by increasing coping skills for anxious and depressive symptoms, challenge negative thoughts, process and let go of the past, increase physical activity and social interaction for 5 out of 7 days for 60 days.  ProgressTowards Goals: Progressing  Interventions: Therapist utilized CBT, Solution focused brief therapy, and ACT to address anxiety and depression. Therapist provided support and empathy to patient during session. Therapist worked with patient to complete a life map including: 1) what or who is most important to you, 2) What thoughts, feelings, or sensations get in the way of moving forward, 3) what do you do to move away from those difficult inner experiences, and 4) what could you do to move toward who's important to you?  Effectiveness: Patient was oriented x4 (person, place, situation, and time). Patient was alert, engaged, pleasant, and cooperative. Patient was able to identify what is important to her including: God, friends, her congregation, being engaged in life, intellectual conversations, friends, and physical health. Patient was able to identify what stands in the way for her to live the life she wants including: perfectionism, unworthy, fears of declining years, lack of energy, and depression. Patient noted that when she feels these she will: worry, try to be a fixer, and avoid. Patient is going to focus on her diet and making changes there to improve her physical health. She feels like this will give her control and taking ownership of her life.   Patient engaged in session. She responded well to interventions. Patient continues to meet criteria for Major depressive disorder, recurrent episode, moderate and Generalized Anxiety Disorder. Patient will continue in outpatient therapy due to being the least restrictive  service to meet her needs. Patient made minimal progress on her goals at this time.   Suicidal/Homicidal: Nowithout intent/plan  Plan: Return again in 2-4 weeks.  Diagnosis: MDD (major depressive disorder), recurrent episode, moderate ([place])  Collaboration of Care: Psychiatrist AEB Dr. Merian Capron  Patient/Guardian was advised Release of Information must be obtained prior to any record release in order to collaborate their care with an outside provider. Patient/Guardian was advised if they have not already done so to contact the registration department to sign all necessary forms in order for [place] to release information regarding their care.   Consent: Patient/Guardian gives verbal consent for treatment and assignment of benefits for services provided during this visit. Patient/Guardian expressed understanding and agreed to proceed.   Glori Bickers, LCSW 09/01/2022

## 2022-09-07 DIAGNOSIS — Z961 Presence of intraocular lens: Secondary | ICD-10-CM | POA: Diagnosis not present

## 2022-09-07 DIAGNOSIS — E119 Type 2 diabetes mellitus without complications: Secondary | ICD-10-CM | POA: Diagnosis not present

## 2022-09-07 LAB — HM DIABETES EYE EXAM

## 2022-09-12 DIAGNOSIS — M9902 Segmental and somatic dysfunction of thoracic region: Secondary | ICD-10-CM | POA: Diagnosis not present

## 2022-09-12 DIAGNOSIS — M9901 Segmental and somatic dysfunction of cervical region: Secondary | ICD-10-CM | POA: Diagnosis not present

## 2022-09-12 DIAGNOSIS — S233XXA Sprain of ligaments of thoracic spine, initial encounter: Secondary | ICD-10-CM | POA: Diagnosis not present

## 2022-09-12 DIAGNOSIS — S338XXA Sprain of other parts of lumbar spine and pelvis, initial encounter: Secondary | ICD-10-CM | POA: Diagnosis not present

## 2022-09-12 DIAGNOSIS — M9903 Segmental and somatic dysfunction of lumbar region: Secondary | ICD-10-CM | POA: Diagnosis not present

## 2022-09-12 DIAGNOSIS — S134XXA Sprain of ligaments of cervical spine, initial encounter: Secondary | ICD-10-CM | POA: Diagnosis not present

## 2022-09-13 ENCOUNTER — Ambulatory Visit (INDEPENDENT_AMBULATORY_CARE_PROVIDER_SITE_OTHER): Payer: Medicare Other | Admitting: Psychiatry

## 2022-09-13 ENCOUNTER — Encounter (HOSPITAL_COMMUNITY): Payer: Self-pay | Admitting: Psychiatry

## 2022-09-13 VITALS — BP 108/70 | HR 64 | Temp 98.4°F | Ht 63.0 in | Wt 193.0 lb

## 2022-09-13 DIAGNOSIS — F411 Generalized anxiety disorder: Secondary | ICD-10-CM | POA: Diagnosis not present

## 2022-09-13 DIAGNOSIS — F331 Major depressive disorder, recurrent, moderate: Secondary | ICD-10-CM | POA: Diagnosis not present

## 2022-09-13 MED ORDER — BUPROPION HCL ER (SR) 200 MG PO TB12: 200.0000 mg | ORAL_TABLET | Freq: Every day | ORAL | 1 refills | Status: DC

## 2022-09-13 MED ORDER — BUPROPION HCL ER (SR) 200 MG PO TB12
200.0000 mg | ORAL_TABLET | Freq: Every day | ORAL | 1 refills | Status: DC
Start: 2022-09-13 — End: 2022-11-15

## 2022-09-13 NOTE — Progress Notes (Signed)
[facility] Follow up visit  Patient Identification: Carolyn [name] MRN:  [id number] Date of Evaluation:  09/13/2022 Referral Source: primary care Chief Complaint:   No chief complaint on file.  Visit Diagnosis:    ICD-10-CM   1. MDD (major depressive disorder), recurrent episode, moderate (HCC)  F33.1     2. GAD (generalized anxiety disorder)  F41.1       History of Present Illness:  61 years ol married white female, initially referred by pcp to establish care for depression, anxiety, currently Retired,    Has had depression for since age 70"s and has been treated with different medications, of all wellbutrin has been consistent in helping Prozac has caused allergic reaction , not sure of what other meds used Patient is currently retired her stressors include her husband is sick with 1 kidney.   Last visit increased wellbutyrin xl 151m to 2073msr, has helped, some more motivation and depression some better Still helps her husband out  Taking some Hemopathic med for anxiety , says it helps too    Aggravating factor: medical co morbidities, some difficult childhood, covid , retirement,  husband sick with one kidney Modifying factor: husband, johava witness   Severity gets subdued Duration all adult life   Past Psychiatric History: depression, anxiety  Previous Psychotropic Medications: Yes   Substance Abuse History in the last 12 months:  No.  Consequences of Substance Abuse: NA  Past Medical History:  Past Medical History:  Diagnosis Date   Depression    [facility]   Diastolic dysfunction    Hyperlipidemia    My[place]ia gravis ([facility]   Patient is Jehovah's Witness    Pulmonary embolism ([facility]19[place]20[place]   Sleep apnea     Past Surgical History:  Procedure Laterality Date   APPENDECTOMY     BIOPSY  11/11/2020   Procedure: BIOPSY;  Surgeon: CaHarvel QualeMD;  [facility];  Service: Gastroenterology;;   duodenum gastric   BUNIONECTOMY     COLONOSCOPY WITH PROPOFOL N/A 11/11/2020   Procedure: COLONOSCOPY WITH PROPOFOL;  Surgeon: CaHarvel QualeMD;  [facility];  Service: Gastroenterology;  Laterality: N/A;  11:15   ESOPHAGOGASTRODUODENOSCOPY (EGD) WITH PROPOFOL N/A 11/11/2020   Procedure: ESOPHAGOGASTRODUODENOSCOPY (EGD) WITH PROPOFOL;  Surgeon: CaHarvel QualeMD;  [facility];  Service: Gastroenterology;  Laterality: N/A;  to [place] at [place], BILATERAL MYRINGOTOMY AND TUBES      Family Psychiatric History: son; bipolar,  Mom : depression Dad ; alcohol use  Family History:  Family History  Problem Relation Age of Onset   Arthritis Mother    Depression Mother    Diabetes Mother    Hypertension Mother    Alcohol abuse Father    Hypertension Father    Heart disease Father [id number]     Massive MI   Diabetes Brother    Diabetes Brother    Heart disease Brother    Diabetes Brother        Type I     Social History:   Social History   Socioeconomic History   Marital status: Married    Spouse name: MiLegrand Como Number of children: 4   Years of education: some college   Highest education level: Some college, no degree  Occupational History   Occupation: Retired  Tobacco Use   Smoking status: Never    Passive  exposure: Never   Smokeless tobacco: Never  Vaping Use   Vaping Use: Never used  Substance and Sexual Activity   Alcohol use: Yes    Comment: socially   Drug use: No   Sexual activity: Yes    Birth control/protection: Other-see comments    Comment: postmenopausal  Other Topics Concern   Not on file  Social History Narrative   Not on file   Social Determinants of Health   Financial Resource Strain: Low Risk  (06/19/2020)   Overall Financial Resource Strain (CARDIA)    Difficulty of Paying Living Expenses: Not very hard  Food Insecurity: No Food Insecurity (02/10/2021)    Hunger Vital Sign    Worried About Running Out of Food in the Last Year: Never true    Ran Out of Food in the Last Year: Never true  Transportation Needs: No Transportation Needs (02/10/2021)   PRAPARE - Hydrologist (Medical): No    Lack of Transportation (Non-Medical): No  Physical Activity: Insufficiently Active (02/10/2021)   Exercise Vital Sign    Days of Exercise per Week: 2 days    Minutes of Exercise per Session: 20 min  Stress: No Stress Concern Present (02/10/2021)   [place]    Feeling of Stress : Not at all  Social Connections: Moderately Integrated (02/10/2021)   Social Connection and Isolation Panel [NHANES]    Frequency of Communication with Friends and Family: More than three times a week    Frequency of Social Gatherings with Friends and Family: Three times a week    Attends Religious Services: More than 4 times per year    Active Member of Clubs or Organizations: No    Attends Archivist Meetings: Never    Marital Status: Married    Additional Social History: grew up with parents, dad was alcoholic, some challenges  Married for 65 years now  Allergies:   Allergies  Allergen Reactions   Prozac [Fluoxetine Hcl] Anaphylaxis   Codeine Other (See Comments)    Intolerance    Erythromycin Other (See Comments)   Other     -mycin medications: has myasthenia gravis    Sulfa Antibiotics Hives    Metabolic Disorder Labs: Lab Results  Component Value Date   HGBA1C 7.8 (H) 08/02/2022   MPG 148 12/28/2020   MPG 148 08/24/2020   No results found for: "PROLACTIN" Lab Results  Component Value Date   CHOL 246 (H) 08/02/2022   TRIG 422 (H) 08/02/2022   HDL 44 08/02/2022   CHOLHDL 5.6 (H) 08/02/2022   LDLCALC 127 (H) 08/02/2022   [place] 68 11/10/2021   Lab Results  Component Value Date   TSH 2.570 08/02/2022    Therapeutic Level Labs: No results found  for: "LITHIUM" No results found for: "CBMZ" No results found for: "VALPROATE"  Current Medications: Current Outpatient Medications  Medication Sig Dispense Refill   Blood Glucose Monitoring Suppl (BLOOD GLUCOSE SYSTEM PAK) KIT Use as directed to monitor FSBS 2x daily. Dx: E11.9 1 each 1   Cholecalciferol (VITAMIN D3) 125 MCG (5000 UT) TABS Take 5,000 Units by mouth daily.     FARXIGA 10 MG TABS tablet TAKE 1 TABLET(10 MG) BY MOUTH DAILY BEFORE BREAKFAST 90 tablet 2   glucose blood (ONETOUCH VERIO) test strip USE TO TEST BLOOD SUGAR TWICE DAILY 100 strip 3   glucose blood (ONETOUCH VERIO) test strip USE TO TEST BLOOD SUGAR TWICE DAILY 100  strip 3   Glycerin-Hypromellose-PEG 400 (DRY EYE RELIEF DROPS) 0.2-0.2-1 % SOLN Place 1 drop into both eyes daily as needed (Dry eye).     hyoscyamine (LEVSIN SL) 0.125 MG SL tablet Place 1 tablet (0.125 mg total) under the tongue every 6 (six) hours as needed (spasms). 30 tablet 1   icosapent Ethyl (VASCEPA) 1 g capsule TAKE 1 CAPSULE(1 GRAM) BY MOUTH DAILY 90 capsule 0   losartan-hydrochlorothiazide (HYZAAR) 100-12.5 MG tablet TAKE 1 TABLET BY MOUTH DAILY 30 tablet 3   metoprolol succinate (TOPROL-XL) 25 MG 24 hr tablet TAKE 1 TABLET(25 MG) BY MOUTH DAILY 90 tablet 1   Multiple Vitamins-Minerals (MULTI COMPLETE) CAPS Take 1 capsule by mouth daily.     pantoprazole (PROTONIX) 40 MG tablet Take 1 tablet (40 mg total) by mouth daily. 90 tablet 3   rosuvastatin (CRESTOR) 10 MG tablet Take 1 tablet (10 mg total) by mouth daily. 90 tablet 1   buPROPion (WELLBUTRIN SR) 200 MG 12 hr tablet Take 1 tablet (200 mg total) by mouth daily. 30 tablet 1   busPIRone (BUSPAR) 5 MG tablet Take 1 tablet (5 mg total) by mouth daily. (Patient not taking: Reported on 08/25/2022) 30 tablet 1   No current facility-administered medications for this visit.    Psychiatric Specialty Exam: Review of Systems  Cardiovascular:  Negative for chest pain.  Neurological:  Negative for  tremors.  Psychiatric/Behavioral:  Negative for agitation and self-injury.     Blood pressure 108/70, pulse 64, temperature 98.4 F (36.9 C), height _0  (1.6 m), weight 193 lb (87.5 kg), SpO2 (!) 9 %.Body mass index is 34.19 kg/m.  General Appearance: Casual  Eye Contact:  Fair  Speech:  Clear and Coherent  Volume:  Normal  Moods: some better  Affect:  Congruent  Thought Process:  Goal Directed  Orientation:  Full (Time, Place, and Person)  Thought Content:  Rumination  Suicidal Thoughts:  No  Homicidal Thoughts:  No  Memory:  Immediate;   Fair  Judgement:  Fair  Insight:  Fair  Psychomotor Activity:  Normal  Concentration:  Concentration: Fair  Recall:  [place]: Good  Akathisia:  No  Handed:    AIMS (if indicated):  not done  Assets:  Desire for Improvement Financial Resources/Insurance Housing Leisure Time  ADL's:  Intact  Cognition: WNL  Sleep:  Fair   Screenings: GAD-7    Flowsheet Row Office Visit from 03/30/2022 in [facility] Office Visit from 02/22/2022 in [facility] Office Visit from 08/31/2021 in [facility] Office Visit from 08/19/2021 in [facility]  Total GAD-7 Score _1 PHQ2-9    [place] Visit from 08/02/2022 in [facility] Counselor from 06/09/2022 in [facility] Office Visit from 06/02/2022 in [facility] Office Visit from 04/21/2022 in [facility] Office Visit from 03/30/2022 in [facility]  PHQ-2 Total Score _2 PHQ-9 Total Score _3 [facility] Office Visit from 08/04/2022 in [facility] Counselor from 06/09/2022 in [facility] Office Visit from 06/02/2022 in [facility] No Risk  No Risk Error: Q3, 4, or 5 should not be populated when Q2 is No  Assessment and Plan: as follows   Prior documentation reviewed  Major depressive disorder recurrent moderate; some better, continue wellbutrin sr 210m now  Generalized anxiety disorder;manageable   Takes Hemophathic med for anxiety , not sedating Fu 327mRefills sent Direct care time spent 20 minutes  including face to face , chart review   NaMerian CapronMD 11/14/20232:17 PM

## 2022-09-15 ENCOUNTER — Ambulatory Visit (INDEPENDENT_AMBULATORY_CARE_PROVIDER_SITE_OTHER): Payer: Medicare Other | Admitting: Gastroenterology

## 2022-09-15 ENCOUNTER — Encounter (INDEPENDENT_AMBULATORY_CARE_PROVIDER_SITE_OTHER): Payer: Self-pay | Admitting: Gastroenterology

## 2022-09-15 VITALS — BP 101/67 | HR 80 | Temp 99.2°F | Ht 63.0 in | Wt 190.4 lb

## 2022-09-15 DIAGNOSIS — K641 Second degree hemorrhoids: Secondary | ICD-10-CM

## 2022-09-15 NOTE — Patient Instructions (Addendum)
Continue to avoid straining.   Limit toilet time to 5 minutes at the most, try to avoid straining   Avoid constipation. Continue 1T benefiber 2-3x/day with meals and water intake to 7-8 glasses daily. You can also try adding in 1 capful of miralax, you can do this every other day to daily if you are still having some harder stools or needing to strain  Occasionally, you may have more bleeding than usual after the banding procedure. This is often from the untreated hemorrhoids rather than the treated one. Don't be concerned if there is a tablespoon or so of blood. If there is more blood than this, lie flat with your bottom higher than your head and apply an ice pack to the area. If the bleeding does not stop within a half an hour or if you feel faint, have severe pain, chills, fever or difficulty passing urine (very rare) or other problems, you should call us at [phone] or report to the nearest emergency room. Please call me with any concerns!  The procedure you have had should have been relatively painless since the banding of the area involved does not have nerve endings and there is no pain sensation. The rubber band cuts off the blood supply to the hemorrhoid and the band may fall off as soon as 48 hours after the banding (the band may occasionally be seen in the toilet bowl following a bowel movement). You may notice a temporary feeling of fullness in the rectum which should respond adequately to plain Tylenol or Motrin.  I will see you back in follow-up in April   Jami Bogdanski L. Alver Sorrow, MSN, APRN, AGNP-C Adult-Gerontology Nurse Practitioner [facility]

## 2022-09-15 NOTE — Progress Notes (Signed)
    Carolyn [name] BANDING PROCEDURE NOTE  [name] is a 71 y.o. female presenting today for consideration of hemorrhoid banding. Last colonoscopy 11/11/20   The patient presents with symptomatic grade II hemorrhoids, unresponsive to maximal medical therapy, requesting rubber band ligation of his/her hemorrhoidal disease.  Initially c/o rectal itching, discomfort with BMs with occasional toilet tissue hematochezia.   Initial banding: 06/07/22 Right posterior, reports some mild improvement in symptoms thereafter, continues to use benefiber to avoid constipation.    Second banding of left lateral internal hemorrhoid on 06/22/22  Today, reports she is doing benefiber 1T 2-3x/day, still having some constipation at times where she will go a day or 2 without a BM and have some rectal pain. No rectal bleeding. Did take miralax once and felt this helped some with softening stool but did not repeat.   All risks, benefits, and alternative forms of therapy were described and informed consent was obtained.  The decision was made to band the Right anterior internal hemorrhoid, and the [place] was used to perform band ligation without complication. Digital anorectal examination was then performed to assure proper positioning of the band, and to adjust the banded tissue as required. The patient was discharged home without pain or other issues. Dietary and behavioral recommendations were given, along with follow-up instructions. The patient will return in several weeks for followup and possible additional banding as required.  No complications were encountered and the patient tolerated the procedure well.    Kiren Mcisaac L. Alver Sorrow, MSN, APRN, AGNP-C Adult-Gerontology Nurse Practitioner [facility]

## 2022-09-19 ENCOUNTER — Encounter (INDEPENDENT_AMBULATORY_CARE_PROVIDER_SITE_OTHER): Payer: Medicare Other | Admitting: Ophthalmology

## 2022-09-19 DIAGNOSIS — H47012 Ischemic optic neuropathy, left eye: Secondary | ICD-10-CM | POA: Diagnosis not present

## 2022-09-19 DIAGNOSIS — H47292 Other optic atrophy, left eye: Secondary | ICD-10-CM | POA: Diagnosis not present

## 2022-09-19 DIAGNOSIS — H43812 Vitreous degeneration, left eye: Secondary | ICD-10-CM | POA: Diagnosis not present

## 2022-09-19 DIAGNOSIS — H43813 Vitreous degeneration, bilateral: Secondary | ICD-10-CM | POA: Diagnosis not present

## 2022-09-19 DIAGNOSIS — H26493 Other secondary cataract, bilateral: Secondary | ICD-10-CM | POA: Diagnosis not present

## 2022-09-19 DIAGNOSIS — G4733 Obstructive sleep apnea (adult) (pediatric): Secondary | ICD-10-CM | POA: Diagnosis not present

## 2022-09-21 DIAGNOSIS — S338XXA Sprain of other parts of lumbar spine and pelvis, initial encounter: Secondary | ICD-10-CM | POA: Diagnosis not present

## 2022-09-21 DIAGNOSIS — S134XXA Sprain of ligaments of cervical spine, initial encounter: Secondary | ICD-10-CM | POA: Diagnosis not present

## 2022-09-21 DIAGNOSIS — M9902 Segmental and somatic dysfunction of thoracic region: Secondary | ICD-10-CM | POA: Diagnosis not present

## 2022-09-21 DIAGNOSIS — M9903 Segmental and somatic dysfunction of lumbar region: Secondary | ICD-10-CM | POA: Diagnosis not present

## 2022-09-21 DIAGNOSIS — M9901 Segmental and somatic dysfunction of cervical region: Secondary | ICD-10-CM | POA: Diagnosis not present

## 2022-09-21 DIAGNOSIS — S233XXA Sprain of ligaments of thoracic spine, initial encounter: Secondary | ICD-10-CM | POA: Diagnosis not present

## 2022-09-30 ENCOUNTER — Ambulatory Visit: Payer: Medicare Other | Admitting: Pulmonary Disease

## 2022-09-30 ENCOUNTER — Encounter: Payer: Self-pay | Admitting: Pulmonary Disease

## 2022-09-30 VITALS — BP 132/66 | HR 60 | Temp 98.2°F | Ht 63.0 in | Wt 190.6 lb

## 2022-09-30 DIAGNOSIS — S338XXA Sprain of other parts of lumbar spine and pelvis, initial encounter: Secondary | ICD-10-CM | POA: Diagnosis not present

## 2022-09-30 DIAGNOSIS — S134XXA Sprain of ligaments of cervical spine, initial encounter: Secondary | ICD-10-CM | POA: Diagnosis not present

## 2022-09-30 DIAGNOSIS — J310 Chronic rhinitis: Secondary | ICD-10-CM

## 2022-09-30 DIAGNOSIS — M9903 Segmental and somatic dysfunction of lumbar region: Secondary | ICD-10-CM | POA: Diagnosis not present

## 2022-09-30 DIAGNOSIS — S233XXA Sprain of ligaments of thoracic spine, initial encounter: Secondary | ICD-10-CM | POA: Diagnosis not present

## 2022-09-30 DIAGNOSIS — G4733 Obstructive sleep apnea (adult) (pediatric): Secondary | ICD-10-CM

## 2022-09-30 DIAGNOSIS — M9902 Segmental and somatic dysfunction of thoracic region: Secondary | ICD-10-CM | POA: Diagnosis not present

## 2022-09-30 DIAGNOSIS — M9901 Segmental and somatic dysfunction of cervical region: Secondary | ICD-10-CM | POA: Diagnosis not present

## 2022-09-30 NOTE — Patient Instructions (Signed)
Follow up in 1 year.

## 2022-09-30 NOTE — Progress Notes (Signed)
[facility]  Chief Complaint  Patient presents with   Follow-up    Cpap working well  Has concerns about recall      Past Surgical History:  She  has a past surgical history that includes Tonsilectomy, adenoidectomy, bilateral myringotomy and tubes; Appendectomy; Mandible surgery; Bunionectomy; Colonoscopy with propofol (N/A, 11/11/2020); Esophagogastroduodenoscopy (egd) with propofol (N/A, 11/11/2020); and biopsy (11/11/2020).  Past Medical History:  Depression, DM, Diastolic CHF, HLD, Myasthenia gravis, Jehovah's witness, PE July 2022  Constitutional:  BP 132/66   Pulse 60   Temp 98.2 F (36.8 C)   Ht _0  (1.6 m)   Wt 190 lb 9.6 oz (86.5 kg)   SpO2 99% Comment: ra  BMI 33.76 kg/m   Brief Summary:  Carolyn [name] is a 71 y.o. female with obstructive sleep apnea.      Subjective:   Uses CPAP nightly.  Has nasal pillow mask, and this is comfortable.  Feels pressure is okay.  Only issue is runny nose.  Physical Exam:   Appearance - well kempt   ENMT - no sinus tenderness, no oral exudate, no LAN, Mallampati 3 airway, no stridor  Respiratory - equal breath sounds bilaterally, no wheezing or rales  CV - s1s2 regular rate and rhythm, no murmurs  Ext - no clubbing, no edema  Skin - no rashes  Psych - normal mood and affect    Chest Imaging:  CT angio chest 05/18/21 >> small distal segmental and subsegmental PE in RLL  Sleep Tests:  HST 10/27/15 >> AHI 45 CPAP 08/30/22 to 09/28/22 >> used on 30 of 30 nights with average 7 hrs 32 min.  Average AHI 0.5 with median CPAP 9 and 95 th percentile CPAP 10 cm H2O  Cardiac Tests:  Echo 01/07/19 >> AHI 50 to 55%, mild LVH  Social History:  She  reports that she has never smoked. She has never been exposed to tobacco smoke. She has never used smokeless tobacco. She reports current alcohol use. She reports that she does not use drugs.  Family History:  Her family history  includes Alcohol abuse in her father; Arthritis in her mother; Depression in her mother; Diabetes in her brother, brother, brother, and mother; Heart disease in her brother; Heart disease (age of onset: 68) in her father; Hypertension in her father and mother.     Assessment/Plan:   Obstructive sleep apnea. - she is compliant with CPAP and reports benefit from therapy - she uses [facility] for her DME - current CPAP ordered January 2022 - continue auto CPAP 5 to 15 cm H2O - explained that CPAP recall doesn't apply to Resmed devices  CPAP rhinitis. - conservative management for now - if this gets worse then options are mask refitting or trying nose sprays (flonase, astepro and/or atrovent)  Time Spent Involved in Patient Care on Day of Examination:  26 minutes  Follow up:   Patient Instructions  Follow up in 1 year  Medication List:   Allergies as of 09/30/2022       Reactions   Prozac [fluoxetine Hcl] Anaphylaxis   Codeine Other (See Comments)   Intolerance   Erythromycin Other (See Comments)   Other    -mycin medications: has myasthenia gravis   Sulfa Antibiotics Hives        Medication List        Accurate as of September 30, 2022 11:15 AM. If you have any questions, ask your nurse or  doctor.          Blood Glucose System Pak Kit Use as directed to monitor FSBS 2x daily. Dx: E11.9   buPROPion 200 MG 12 hr tablet Commonly known as: Wellbutrin SR Take 1 tablet (200 mg total) by mouth daily.   busPIRone 5 MG tablet Commonly known as: BUSPAR Take 1 tablet (5 mg total) by mouth daily.   Dry Eye Relief Drops 0.2-0.2-1 % Soln Generic drug: Glycerin-Hypromellose-PEG 400 Place 1 drop into both eyes daily as needed (Dry eye).   Farxiga 10 MG Tabs tablet Generic drug: dapagliflozin propanediol TAKE 1 TABLET(10 MG) BY MOUTH DAILY BEFORE BREAKFAST   hyoscyamine 0.125 MG SL tablet Commonly known as: LEVSIN SL Place 1 tablet (0.125 mg total) under the  tongue every 6 (six) hours as needed (spasms).   icosapent Ethyl 1 g capsule Commonly known as: VASCEPA TAKE 1 CAPSULE(1 GRAM) BY MOUTH DAILY   losartan-hydrochlorothiazide 100-12.5 MG tablet Commonly known as: HYZAAR TAKE 1 TABLET BY MOUTH DAILY   metoprolol succinate 25 MG 24 hr tablet Commonly known as: TOPROL-XL TAKE 1 TABLET(25 MG) BY MOUTH DAILY   Multi Complete Caps Take 1 capsule by mouth daily.   OneTouch Verio test strip Generic drug: glucose blood USE TO TEST BLOOD SUGAR TWICE DAILY   OneTouch Verio test strip Generic drug: glucose blood USE TO TEST BLOOD SUGAR TWICE DAILY   pantoprazole 40 MG tablet Commonly known as: PROTONIX Take 1 tablet (40 mg total) by mouth daily.   rosuvastatin 10 MG tablet Commonly known as: Crestor Take 1 tablet (10 mg total) by mouth daily.   Vitamin D3 125 MCG (5000 UT) Tabs Take 5,000 Units by mouth daily.        Signature:  Chesley Mires, MD [place] Pager - [phone] 09/30/2022, 11:15 AM

## 2022-10-06 DIAGNOSIS — G4733 Obstructive sleep apnea (adult) (pediatric): Secondary | ICD-10-CM | POA: Diagnosis not present

## 2022-10-07 DIAGNOSIS — S338XXA Sprain of other parts of lumbar spine and pelvis, initial encounter: Secondary | ICD-10-CM | POA: Diagnosis not present

## 2022-10-07 DIAGNOSIS — M9902 Segmental and somatic dysfunction of thoracic region: Secondary | ICD-10-CM | POA: Diagnosis not present

## 2022-10-07 DIAGNOSIS — S233XXA Sprain of ligaments of thoracic spine, initial encounter: Secondary | ICD-10-CM | POA: Diagnosis not present

## 2022-10-07 DIAGNOSIS — M9903 Segmental and somatic dysfunction of lumbar region: Secondary | ICD-10-CM | POA: Diagnosis not present

## 2022-10-07 DIAGNOSIS — S134XXA Sprain of ligaments of cervical spine, initial encounter: Secondary | ICD-10-CM | POA: Diagnosis not present

## 2022-10-07 DIAGNOSIS — M9901 Segmental and somatic dysfunction of cervical region: Secondary | ICD-10-CM | POA: Diagnosis not present

## 2022-10-14 ENCOUNTER — Telehealth: Payer: Self-pay | Admitting: Pulmonary Disease

## 2022-10-14 NOTE — Telephone Encounter (Signed)
Pt called saying her and Dr. discussed changing her C-Pap pressure and she feels that is a good idea. Pls call to advise. [phone]. TY

## 2022-10-14 NOTE — Telephone Encounter (Signed)
I spoke with the pt  She states that at her last visit with Dr Halford Chessman he told her that her DL showed that she was at the borderline of CPAP being effective  She states that she feels like sometimes she is not getting quite enough air, and wants to know if settings can be increased  Dr Halford Chessman out of clinic so routing to APP of the day to be addressed  Thanks!

## 2022-10-17 NOTE — Telephone Encounter (Signed)
Can we get download so I can look at

## 2022-10-17 NOTE — Telephone Encounter (Signed)
Please advise. Thank you

## 2022-10-17 NOTE — Telephone Encounter (Signed)
I looked at Dr. Halford Chessman note and her last download over the last 30 days it is perfect Her CPAP control is excellent.  Her usage is excellent.  She is having no sleep apneic events as her AHI is 0.4.  Her pressure seems to be perfect if she needs this to be lowered we can definitely do that but overall pressure is perfect. Was also perfect at last visit with Dr. Halford Chessman.

## 2022-10-18 NOTE — Telephone Encounter (Signed)
I called and spoke with the pt and notified of response per Tammy  She verbalized understanding  Nothing further needed

## 2022-10-19 DIAGNOSIS — M9903 Segmental and somatic dysfunction of lumbar region: Secondary | ICD-10-CM | POA: Diagnosis not present

## 2022-10-19 DIAGNOSIS — M9901 Segmental and somatic dysfunction of cervical region: Secondary | ICD-10-CM | POA: Diagnosis not present

## 2022-10-19 DIAGNOSIS — S233XXA Sprain of ligaments of thoracic spine, initial encounter: Secondary | ICD-10-CM | POA: Diagnosis not present

## 2022-10-19 DIAGNOSIS — M9902 Segmental and somatic dysfunction of thoracic region: Secondary | ICD-10-CM | POA: Diagnosis not present

## 2022-10-19 DIAGNOSIS — S134XXA Sprain of ligaments of cervical spine, initial encounter: Secondary | ICD-10-CM | POA: Diagnosis not present

## 2022-10-19 DIAGNOSIS — S338XXA Sprain of other parts of lumbar spine and pelvis, initial encounter: Secondary | ICD-10-CM | POA: Diagnosis not present

## 2022-10-28 DIAGNOSIS — S233XXA Sprain of ligaments of thoracic spine, initial encounter: Secondary | ICD-10-CM | POA: Diagnosis not present

## 2022-10-28 DIAGNOSIS — M9901 Segmental and somatic dysfunction of cervical region: Secondary | ICD-10-CM | POA: Diagnosis not present

## 2022-10-28 DIAGNOSIS — S134XXA Sprain of ligaments of cervical spine, initial encounter: Secondary | ICD-10-CM | POA: Diagnosis not present

## 2022-10-28 DIAGNOSIS — M9902 Segmental and somatic dysfunction of thoracic region: Secondary | ICD-10-CM | POA: Diagnosis not present

## 2022-10-28 DIAGNOSIS — M9903 Segmental and somatic dysfunction of lumbar region: Secondary | ICD-10-CM | POA: Diagnosis not present

## 2022-10-28 DIAGNOSIS — S338XXA Sprain of other parts of lumbar spine and pelvis, initial encounter: Secondary | ICD-10-CM | POA: Diagnosis not present

## 2022-11-03 ENCOUNTER — Other Ambulatory Visit: Payer: Self-pay | Admitting: Internal Medicine

## 2022-11-03 DIAGNOSIS — E782 Mixed hyperlipidemia: Secondary | ICD-10-CM

## 2022-11-07 DIAGNOSIS — M9902 Segmental and somatic dysfunction of thoracic region: Secondary | ICD-10-CM | POA: Diagnosis not present

## 2022-11-07 DIAGNOSIS — M9901 Segmental and somatic dysfunction of cervical region: Secondary | ICD-10-CM | POA: Diagnosis not present

## 2022-11-07 DIAGNOSIS — M9903 Segmental and somatic dysfunction of lumbar region: Secondary | ICD-10-CM | POA: Diagnosis not present

## 2022-11-07 DIAGNOSIS — S233XXA Sprain of ligaments of thoracic spine, initial encounter: Secondary | ICD-10-CM | POA: Diagnosis not present

## 2022-11-07 DIAGNOSIS — S134XXA Sprain of ligaments of cervical spine, initial encounter: Secondary | ICD-10-CM | POA: Diagnosis not present

## 2022-11-07 DIAGNOSIS — S338XXA Sprain of other parts of lumbar spine and pelvis, initial encounter: Secondary | ICD-10-CM | POA: Diagnosis not present

## 2022-11-15 ENCOUNTER — Other Ambulatory Visit (HOSPITAL_COMMUNITY): Payer: Self-pay

## 2022-11-15 MED ORDER — BUPROPION HCL ER (SR) 200 MG PO TB12
200.0000 mg | ORAL_TABLET | Freq: Every day | ORAL | 1 refills | Status: DC
Start: 2022-11-15 — End: 2022-12-29

## 2022-11-16 DIAGNOSIS — S233XXA Sprain of ligaments of thoracic spine, initial encounter: Secondary | ICD-10-CM | POA: Diagnosis not present

## 2022-11-16 DIAGNOSIS — M9903 Segmental and somatic dysfunction of lumbar region: Secondary | ICD-10-CM | POA: Diagnosis not present

## 2022-11-16 DIAGNOSIS — S338XXA Sprain of other parts of lumbar spine and pelvis, initial encounter: Secondary | ICD-10-CM | POA: Diagnosis not present

## 2022-11-16 DIAGNOSIS — M9902 Segmental and somatic dysfunction of thoracic region: Secondary | ICD-10-CM | POA: Diagnosis not present

## 2022-11-16 DIAGNOSIS — S134XXA Sprain of ligaments of cervical spine, initial encounter: Secondary | ICD-10-CM | POA: Diagnosis not present

## 2022-11-16 DIAGNOSIS — M9901 Segmental and somatic dysfunction of cervical region: Secondary | ICD-10-CM | POA: Diagnosis not present

## 2022-11-30 DIAGNOSIS — S134XXA Sprain of ligaments of cervical spine, initial encounter: Secondary | ICD-10-CM | POA: Diagnosis not present

## 2022-11-30 DIAGNOSIS — M9901 Segmental and somatic dysfunction of cervical region: Secondary | ICD-10-CM | POA: Diagnosis not present

## 2022-11-30 DIAGNOSIS — S233XXA Sprain of ligaments of thoracic spine, initial encounter: Secondary | ICD-10-CM | POA: Diagnosis not present

## 2022-11-30 DIAGNOSIS — S338XXA Sprain of other parts of lumbar spine and pelvis, initial encounter: Secondary | ICD-10-CM | POA: Diagnosis not present

## 2022-11-30 DIAGNOSIS — M9902 Segmental and somatic dysfunction of thoracic region: Secondary | ICD-10-CM | POA: Diagnosis not present

## 2022-11-30 DIAGNOSIS — M9903 Segmental and somatic dysfunction of lumbar region: Secondary | ICD-10-CM | POA: Diagnosis not present

## 2022-12-07 ENCOUNTER — Encounter: Payer: Self-pay | Admitting: Internal Medicine

## 2022-12-07 ENCOUNTER — Ambulatory Visit (INDEPENDENT_AMBULATORY_CARE_PROVIDER_SITE_OTHER): Payer: Medicare Other | Admitting: Internal Medicine

## 2022-12-07 VITALS — BP 144/86 | HR 82 | Ht 63.0 in | Wt 186.8 lb

## 2022-12-07 DIAGNOSIS — I10 Essential (primary) hypertension: Secondary | ICD-10-CM | POA: Diagnosis not present

## 2022-12-07 DIAGNOSIS — E782 Mixed hyperlipidemia: Secondary | ICD-10-CM | POA: Diagnosis not present

## 2022-12-07 DIAGNOSIS — F339 Major depressive disorder, recurrent, unspecified: Secondary | ICD-10-CM | POA: Diagnosis not present

## 2022-12-07 DIAGNOSIS — E1169 Type 2 diabetes mellitus with other specified complication: Secondary | ICD-10-CM

## 2022-12-07 LAB — POCT GLYCOSYLATED HEMOGLOBIN (HGB A1C): HbA1c, POC (controlled diabetic range): 7.1 % — AB (ref 0.0–7.0)

## 2022-12-07 MED ORDER — ICOSAPENT ETHYL 0.5 G PO CAPS: 0.5000 g | ORAL_CAPSULE | Freq: Two times a day (BID) | ORAL | 3 refills | Status: DC

## 2022-12-07 MED ORDER — METOPROLOL SUCCINATE ER 25 MG PO TB24: 25.0000 mg | ORAL_TABLET | Freq: Every day | ORAL | 1 refills | Status: DC

## 2022-12-07 MED ORDER — VASCEPA 1 G PO CAPS: 2.0000 g | ORAL_CAPSULE | Freq: Two times a day (BID) | ORAL | 3 refills | Status: DC

## 2022-12-07 MED ORDER — LOSARTAN POTASSIUM-HCTZ 100-12.5 MG PO TABS: 1.0000 | ORAL_TABLET | Freq: Every day | ORAL | 1 refills | Status: DC

## 2022-12-07 NOTE — Progress Notes (Signed)
Established Patient Office Visit  Subjective:  Patient ID: [name], female    DOB: [birth year]  Age: 72 y.o. MRN: [id number]  CC:  Chief Complaint  Patient presents with   Hypertension    Four month follow up for hypertension and diabetes. Discuss medication changes    HPI Carolyn [name] is a 72 y.o. female with past medical history of  HTN, PE, OSA, IBS, DM, HLD, depression and obesity who presents for f/u of her chronic medical conditions.  Depression: She complains of anhedonia, which has been worse recently.  Her daughter-in-law passed away in the last week in an MVA. She also reports fatigue, insomnia, agitation, lack of interest in activities and decreased concentration.  She also reports crying spells at times. She denies any SI or HI currently.  She is currently taking Wellbutrin 200 mg daily. She has been on Zoloft, Lexapro and Prozac in the past, which were ineffective.  HTN: BP is elevated, but states that she has run out of Losartan-HCTZ. She usually takes medications regularly. Patient denies headache, dizziness, chest pain, dyspnea or palpitations.  Type II DM: She takes [place] for it.  Denies any polyuria or polyphagia currently. She has been taking statin and Vascepa for HLD.  Past Medical History:  Diagnosis Date   Depression    Diabetes mellitus without complication ([place])    Diastolic dysfunction    Hyperlipidemia    Myasthenia gravis ([place])    Patient is Jehovah's Witness    Pulmonary embolism ([place]) 05/18/2021   Sleep apnea     Past Surgical History:  Procedure Laterality Date   APPENDECTOMY     BIOPSY  11/11/2020   Procedure: BIOPSY;  Surgeon: Harvel Quale, MD;  [facility];  Service: Gastroenterology;;  duodenum gastric   BUNIONECTOMY     COLONOSCOPY WITH PROPOFOL N/A 11/11/2020   Procedure: COLONOSCOPY WITH PROPOFOL;  Surgeon: Harvel Quale, MD;  [facility];  Service: Gastroenterology;   Laterality: N/A;  11:15   ESOPHAGOGASTRODUODENOSCOPY (EGD) WITH PROPOFOL N/A 11/11/2020   Procedure: ESOPHAGOGASTRODUODENOSCOPY (EGD) WITH PROPOFOL;  Surgeon: Harvel Quale, MD;  [facility];  Service: Gastroenterology;  Laterality: N/A;  to arrive at [place], ADENOIDECTOMY, BILATERAL MYRINGOTOMY AND TUBES      Family History  Problem Relation Age of Onset   Arthritis Mother    Depression Mother    Diabetes Mother    Hypertension Mother    Alcohol abuse Father    Hypertension Father    Heart disease Father 73       Massive MI   Diabetes Brother    Diabetes Brother    Heart disease Brother    Diabetes Brother        Type I     Social History   Socioeconomic History   Marital status: Married    Spouse name: Carolyn [name]   Number of children: 4   Years of education: some college   Highest education level: Some college, no degree  Occupational History   Occupation: Retired  Tobacco Use   Smoking status: Never    Passive exposure: Never   Smokeless tobacco: Never  Vaping Use   Vaping Use: Never used  Substance and Sexual Activity   Alcohol use: Yes    Comment: socially   Drug use: No   Sexual activity: Yes    Birth control/protection: Other-see comments    Comment: postmenopausal  Other Topics Concern   Not on file  Social History Narrative   Not on file   Social Determinants of Health   Financial Resource Strain: Low Risk  (06/19/2020)   Overall Financial Resource Strain (CARDIA)    Difficulty of Paying Living Expenses: Not very hard  Food Insecurity: No Food Insecurity (02/10/2021)   Hunger Vital Sign    Worried About Running Out of Food in the Last Year: Never true    Ran Out of Food in the Last Year: Never true  Transportation Needs: No Transportation Needs (02/10/2021)   PRAPARE - Hydrologist (Medical): No    Lack of Transportation (Non-Medical): No  Physical Activity:  Insufficiently Active (02/10/2021)   Exercise Vital Sign    Days of Exercise per Week: 2 days    Minutes of Exercise per Session: 20 min  Stress: No Stress Concern Present (02/10/2021)   [place]    Feeling of Stress : Not at all  Social Connections: Moderately Integrated (02/10/2021)   Social Connection and Isolation Panel [NHANES]    Frequency of Communication with Friends and Family: More than three times a week    Frequency of Social Gatherings with Friends and Family: Three times a week    Attends Religious Services: More than 4 times per year    Active Member of Clubs or Organizations: No    Attends Archivist Meetings: Never    Marital Status: Married  Human resources officer Violence: Not At Risk (02/10/2021)   Humiliation, Afraid, Rape, and Kick questionnaire    Fear of Current or Ex-Partner: No    Emotionally Abused: No    Physically Abused: No    Sexually Abused: No    Outpatient Medications Prior to Visit  Medication Sig Dispense Refill   Blood Glucose Monitoring Suppl (BLOOD GLUCOSE SYSTEM PAK) KIT Use as directed to monitor FSBS 2x daily. Dx: E11.9 1 each 1   buPROPion (WELLBUTRIN SR) 200 MG 12 hr tablet Take 1 tablet (200 mg total) by mouth daily. 30 tablet 1   busPIRone (BUSPAR) 5 MG tablet Take 1 tablet (5 mg total) by mouth daily. 30 tablet 1   Cholecalciferol (VITAMIN D3) 125 MCG (5000 UT) TABS Take 5,000 Units by mouth daily.     FARXIGA 10 MG TABS tablet TAKE 1 TABLET(10 MG) BY MOUTH DAILY BEFORE BREAKFAST 90 tablet 2   glucose blood (ONETOUCH VERIO) test strip USE TO TEST BLOOD SUGAR TWICE DAILY 100 strip 3   glucose blood (ONETOUCH VERIO) test strip USE TO TEST BLOOD SUGAR TWICE DAILY 100 strip 3   Glycerin-Hypromellose-PEG 400 (DRY EYE RELIEF DROPS) 0.2-0.2-1 % SOLN Place 1 drop into both eyes daily as needed (Dry eye).     hyoscyamine (LEVSIN SL) 0.125 MG SL tablet Place 1 tablet (0.125 mg  total) under the tongue every 6 (six) hours as needed (spasms). 30 tablet 1   Multiple Vitamins-Minerals (MULTI COMPLETE) CAPS Take 1 capsule by mouth daily.     pantoprazole (PROTONIX) 40 MG tablet Take 1 tablet (40 mg total) by mouth daily. 90 tablet 3   rosuvastatin (CRESTOR) 10 MG tablet TAKE 1 TABLET(10 MG) BY MOUTH DAILY 90 tablet 1   icosapent Ethyl (VASCEPA) 1 g capsule TAKE 1 CAPSULE(1 GRAM) BY MOUTH DAILY 90 capsule 0   losartan-hydrochlorothiazide (HYZAAR) 100-12.5 MG tablet TAKE 1 TABLET BY MOUTH DAILY 30 tablet 3   metoprolol succinate (TOPROL-XL) 25 MG 24  hr tablet TAKE 1 TABLET(25 MG) BY MOUTH DAILY 90 tablet 1   No facility-administered medications prior to visit.    Allergies  Allergen Reactions   Prozac [Fluoxetine Hcl] Anaphylaxis   Codeine Other (See Comments)    Intolerance    Erythromycin Other (See Comments)   Other     -mycin medications: has myasthenia gravis    Sulfa Antibiotics Hives    ROS Review of Systems  Constitutional:  Negative for chills and fever.  HENT:  Negative for congestion, sinus pressure, sinus pain and sore throat.   Eyes:  Negative for pain and discharge.  Respiratory:  Negative for cough and shortness of breath.   Cardiovascular:  Negative for chest pain and palpitations.  Gastrointestinal:  Negative for abdominal pain, constipation, diarrhea, nausea and vomiting.  Endocrine: Negative for polydipsia and polyuria.  Genitourinary:  Negative for dysuria and hematuria.  Musculoskeletal:  Negative for neck pain and neck stiffness.  Skin:  Negative for rash.  Neurological:  Negative for dizziness and weakness.  Psychiatric/Behavioral:  Positive for dysphoric mood and sleep disturbance. Negative for agitation and behavioral problems. The patient is nervous/anxious.       Objective:    Physical Exam Vitals reviewed.  Constitutional:      General: She is not in acute distress.    Appearance: She is not diaphoretic.  HENT:     Head:  Normocephalic and atraumatic.     Nose: Nose normal.     Mouth/Throat:     Mouth: Mucous membranes are moist.  Eyes:     General: No scleral icterus.    Extraocular Movements: Extraocular movements intact.  Cardiovascular:     Rate and Rhythm: Normal rate and regular rhythm.     Pulses: Normal pulses.     Heart sounds: Normal heart sounds. No murmur heard. Pulmonary:     Breath sounds: Normal breath sounds. No wheezing or rales.  Abdominal:     Palpations: Abdomen is soft.     Tenderness: There is no abdominal tenderness.  Musculoskeletal:     Cervical back: Neck supple. No tenderness.     Right lower leg: No edema.     Left lower leg: No edema.  Skin:    General: Skin is warm.     Findings: No rash.  Neurological:     General: No focal deficit present.     Mental Status: She is alert and oriented to person, place, and time.     Sensory: No sensory deficit.     Motor: No weakness.  Psychiatric:        Mood and Affect: Mood is depressed.        Behavior: Behavior is slowed.     BP (!) 144/86 (BP Location: Left Arm, Cuff Size: Normal)   Pulse 82   Ht 5' 3"$  (1.6 m)   Wt 186 lb 12.8 oz (84.7 kg)   SpO2 96%   BMI 33.09 kg/m  Wt Readings from Last 3 Encounters:  12/07/22 186 lb 12.8 oz (84.7 kg)  09/30/22 190 lb 9.6 oz (86.5 kg)  09/15/22 190 lb 6.4 oz (86.4 kg)    Lab Results  Component Value Date   TSH 2.570 08/02/2022   Lab Results  Component Value Date   WBC 6.8 08/02/2022   HGB 16.6 (H) 08/02/2022   HCT 48.6 (H) 08/02/2022   MCV 94 08/02/2022   PLT 244 08/02/2022   Lab Results  Component Value Date   NA 144 08/02/2022  K 4.1 08/02/2022   CO2 25 08/02/2022   GLUCOSE 117 (H) 08/02/2022   BUN 13 08/02/2022   CREATININE 0.82 08/02/2022   BILITOT 0.8 08/02/2022   ALKPHOS 82 08/02/2022   AST 21 08/02/2022   ALT 25 08/02/2022   PROT 7.4 08/02/2022   ALBUMIN 4.9 08/02/2022   CALCIUM 10.2 08/02/2022   ANIONGAP 11 05/29/2022   EGFR 77 08/02/2022    Lab Results  Component Value Date   CHOL 246 (H) 08/02/2022   Lab Results  Component Value Date   HDL 44 08/02/2022   Lab Results  Component Value Date   LDLCALC 127 (H) 08/02/2022   Lab Results  Component Value Date   TRIG 422 (H) 08/02/2022   Lab Results  Component Value Date   CHOLHDL 5.6 (H) 08/02/2022   Lab Results  Component Value Date   HGBA1C 7.1 (A) 12/07/2022      Assessment & Plan:   Problem List Items Addressed This Visit       Cardiovascular and Mediastinum   Hypertension - Primary    BP Readings from Last 1 Encounters:  12/07/22 (!) 144/86  Elevated as she has run out of Losartan-HCTZ Usually well-controlled with  Losartan-HCTZ 100-12.5 mg QD and Metoprolol 25 mg QD Counseled for compliance with the medications Advised DASH diet and moderate exercise/walking, at least 150 mins/week      Relevant Medications   metoprolol succinate (TOPROL-XL) 25 MG 24 hr tablet   losartan-hydrochlorothiazide (HYZAAR) 100-12.5 MG tablet   VASCEPA 1 g capsule     Endocrine   Diabetes mellitus, type II (HCC)    Lab Results  Component Value Date   HGBA1C 7.1 (A) 12/07/2022  Associated with HLD and HTN Overall well-controlled On Farxiga, did not tolerate Metformin Advised to follow diabetic diet On ARB and statin      Relevant Medications   losartan-hydrochlorothiazide (HYZAAR) 100-12.5 MG tablet   Other Relevant Orders   POCT glycosylated hemoglobin (Hb A1C) (Completed)   CMP14+EGFR   Hemoglobin A1c     Other   Depression, recurrent ([place])    [place] Office Visit from 12/07/2022 in [facility]  PHQ-9 Total Score 6     Uncontrolled with Wellbutrin Does not take BuSpar regularly as it only helps with sleep Had severe anxiety and insomnia with Zoloft 50 mg QD, discontinued Did not have benefit with Lexapro in the past Followed by psychiatry in [place]      Hyperlipidemia    Lipid profile reviewed On Crestor and  Vascepa Increased dose of Vascepa to 2 gm BID      Relevant Medications   metoprolol succinate (TOPROL-XL) 25 MG 24 hr tablet   losartan-hydrochlorothiazide (HYZAAR) 100-12.5 MG tablet   VASCEPA 1 g capsule   Other Relevant Orders   Lipid Profile    Meds ordered this encounter  Medications   metoprolol succinate (TOPROL-XL) 25 MG 24 hr tablet    Sig: Take 1 tablet (25 mg total) by mouth daily.    Dispense:  90 tablet    Refill:  1   losartan-hydrochlorothiazide (HYZAAR) 100-12.5 MG tablet    Sig: Take 1 tablet by mouth daily.    Dispense:  90 tablet    Refill:  1   DISCONTD: Icosapent Ethyl (VASCEPA) 0.5 g CAPS    Sig: Take 1 capsule (0.5 g total) by mouth 2 (two) times daily.    Dispense:  180 capsule    Refill:  3  VASCEPA 1 g capsule    Sig: Take 2 capsules (2 g total) by mouth 2 (two) times daily.    Dispense:  360 capsule    Refill:  3    Follow-up: Return in about 4 months (around 04/07/2023) for DM and HTN.    Lindell Spar, MD

## 2022-12-07 NOTE — Patient Instructions (Signed)
Please start taking Vascepa as prescribed.  Please continue taking medications as prescribed.  Please continue to follow low salt diet and ambulate as tolerated.

## 2022-12-09 NOTE — Assessment & Plan Note (Signed)
BP Readings from Last 1 Encounters:  12/07/22 (!) 144/86   Elevated as she has run out of Losartan-HCTZ Usually well-controlled with  Losartan-HCTZ 100-12.5 mg QD and Metoprolol 25 mg QD Counseled for compliance with the medications Advised DASH diet and moderate exercise/walking, at least 150 mins/week

## 2022-12-09 NOTE — Assessment & Plan Note (Signed)
Lab Results  Component Value Date   HGBA1C 7.1 (A) 12/07/2022   Associated with HLD and HTN Overall well-controlled On Farxiga, did not tolerate Metformin Advised to follow diabetic diet On ARB and statin

## 2022-12-09 NOTE — Assessment & Plan Note (Deleted)
Physical exam as documented. Fasting blood tests reviewed. Advised to take Tdap vaccine at local pharmacy.

## 2022-12-09 NOTE — Assessment & Plan Note (Addendum)
Lipid profile reviewed On Crestor and Vascepa Increased dose of Vascepa to 2 gm BID

## 2022-12-09 NOTE — Assessment & Plan Note (Signed)
[place] Office Visit from 12/07/2022 in [facility]  PHQ-9 Total Score 6      Uncontrolled with Wellbutrin Does not take BuSpar regularly as it only helps with sleep Had severe anxiety and insomnia with Zoloft 50 mg QD, discontinued Did not have benefit with Lexapro in the past Followed by psychiatry in [place]

## 2022-12-14 DIAGNOSIS — M9903 Segmental and somatic dysfunction of lumbar region: Secondary | ICD-10-CM | POA: Diagnosis not present

## 2022-12-14 DIAGNOSIS — M9901 Segmental and somatic dysfunction of cervical region: Secondary | ICD-10-CM | POA: Diagnosis not present

## 2022-12-14 DIAGNOSIS — M9902 Segmental and somatic dysfunction of thoracic region: Secondary | ICD-10-CM | POA: Diagnosis not present

## 2022-12-14 DIAGNOSIS — S233XXA Sprain of ligaments of thoracic spine, initial encounter: Secondary | ICD-10-CM | POA: Diagnosis not present

## 2022-12-14 DIAGNOSIS — S338XXA Sprain of other parts of lumbar spine and pelvis, initial encounter: Secondary | ICD-10-CM | POA: Diagnosis not present

## 2022-12-14 DIAGNOSIS — S134XXA Sprain of ligaments of cervical spine, initial encounter: Secondary | ICD-10-CM | POA: Diagnosis not present

## 2022-12-15 ENCOUNTER — Ambulatory Visit (HOSPITAL_COMMUNITY): Payer: Medicare Other | Admitting: Psychiatry

## 2022-12-19 DIAGNOSIS — M9901 Segmental and somatic dysfunction of cervical region: Secondary | ICD-10-CM | POA: Diagnosis not present

## 2022-12-19 DIAGNOSIS — M9903 Segmental and somatic dysfunction of lumbar region: Secondary | ICD-10-CM | POA: Diagnosis not present

## 2022-12-19 DIAGNOSIS — S338XXA Sprain of other parts of lumbar spine and pelvis, initial encounter: Secondary | ICD-10-CM | POA: Diagnosis not present

## 2022-12-19 DIAGNOSIS — S233XXA Sprain of ligaments of thoracic spine, initial encounter: Secondary | ICD-10-CM | POA: Diagnosis not present

## 2022-12-19 DIAGNOSIS — S134XXA Sprain of ligaments of cervical spine, initial encounter: Secondary | ICD-10-CM | POA: Diagnosis not present

## 2022-12-19 DIAGNOSIS — M9902 Segmental and somatic dysfunction of thoracic region: Secondary | ICD-10-CM | POA: Diagnosis not present

## 2022-12-28 ENCOUNTER — Ambulatory Visit (INDEPENDENT_AMBULATORY_CARE_PROVIDER_SITE_OTHER): Payer: Medicare Other | Admitting: Licensed Clinical Social Worker

## 2022-12-28 DIAGNOSIS — F331 Major depressive disorder, recurrent, moderate: Secondary | ICD-10-CM

## 2022-12-28 DIAGNOSIS — M9903 Segmental and somatic dysfunction of lumbar region: Secondary | ICD-10-CM | POA: Diagnosis not present

## 2022-12-28 DIAGNOSIS — S233XXA Sprain of ligaments of thoracic spine, initial encounter: Secondary | ICD-10-CM | POA: Diagnosis not present

## 2022-12-28 DIAGNOSIS — F411 Generalized anxiety disorder: Secondary | ICD-10-CM | POA: Diagnosis not present

## 2022-12-28 DIAGNOSIS — S338XXA Sprain of other parts of lumbar spine and pelvis, initial encounter: Secondary | ICD-10-CM | POA: Diagnosis not present

## 2022-12-28 DIAGNOSIS — M9902 Segmental and somatic dysfunction of thoracic region: Secondary | ICD-10-CM | POA: Diagnosis not present

## 2022-12-28 DIAGNOSIS — M9901 Segmental and somatic dysfunction of cervical region: Secondary | ICD-10-CM | POA: Diagnosis not present

## 2022-12-28 DIAGNOSIS — S134XXA Sprain of ligaments of cervical spine, initial encounter: Secondary | ICD-10-CM | POA: Diagnosis not present

## 2022-12-29 ENCOUNTER — Encounter (HOSPITAL_COMMUNITY): Payer: Self-pay | Admitting: Psychiatry

## 2022-12-29 ENCOUNTER — Ambulatory Visit (INDEPENDENT_AMBULATORY_CARE_PROVIDER_SITE_OTHER): Payer: Medicare Other | Admitting: Psychiatry

## 2022-12-29 VITALS — BP 128/80 | HR 70 | Temp 98.4°F | Ht 61.75 in | Wt 187.0 lb

## 2022-12-29 DIAGNOSIS — F411 Generalized anxiety disorder: Secondary | ICD-10-CM

## 2022-12-29 DIAGNOSIS — F331 Major depressive disorder, recurrent, moderate: Secondary | ICD-10-CM | POA: Diagnosis not present

## 2022-12-29 MED ORDER — BUPROPION HCL ER (SR) 200 MG PO TB12: 200.0000 mg | ORAL_TABLET | Freq: Every day | ORAL | 1 refills | Status: DC

## 2022-12-29 NOTE — Progress Notes (Signed)
[place] Follow up visit  Patient Identification: Carolyn [name] MRN:  [id number] Date of Evaluation:  12/29/2022 Referral Source: primary care Chief Complaint:   No chief complaint on file. Follow up depression  Visit Diagnosis:    ICD-10-CM   1. MDD (major depressive disorder), recurrent episode, moderate (HCC)  F33.1     2. GAD (generalized anxiety disorder)  F41.1       History of Present Illness:  72 years ol married white female, initially referred by pcp to establish care for depression, anxiety, currently Retired,    Has had depression for since age 72"s and has been treated with different medications, of all wellbutrin has been consistent in helping  Doing fair  Although last daughter in law in accident. Son is managing grief It effected her but doing better   Wellbutrin is helping depression  Taking some Hemopathic med for anxiety , says it helps too    Aggravating factor: medical co morbidities, some difficult childhood , retirement,  husband sick with one kidney Modifying factor: husband, johova witness   Severity manageable  Duration all adult life   Past Psychiatric History: depression, anxiety  Previous Psychotropic Medications: Yes   Substance Abuse History in the last 12 months:  No.  Consequences of Substance Abuse: NA  Past Medical History:  Past Medical History:  Diagnosis Date   Depression    Diabetes mellitus without complication ([place])    Diastolic dysfunction    Hyperlipidemia    Myasthenia gravis ([place])    Patient is Jehovah's Witness    Pulmonary embolism ([place]) 05/18/2021   Sleep apnea     Past Surgical History:  Procedure Laterality Date   APPENDECTOMY     BIOPSY  11/11/2020   Procedure: BIOPSY;  Surgeon: Harvel Quale, MD;  [facility];  Service: Gastroenterology;;  duodenum gastric   BUNIONECTOMY     COLONOSCOPY WITH PROPOFOL N/A 11/11/2020   Procedure: COLONOSCOPY WITH PROPOFOL;  Surgeon: Harvel Quale, MD;  [facility];  Service: Gastroenterology;  Laterality: N/A;  11:15   ESOPHAGOGASTRODUODENOSCOPY (EGD) WITH PROPOFOL N/A 11/11/2020   Procedure: ESOPHAGOGASTRODUODENOSCOPY (EGD) WITH PROPOFOL;  Surgeon: Harvel Quale, MD;  [facility];  Service: Gastroenterology;  Laterality: N/A;  to arrive at [place], ADENOIDECTOMY, BILATERAL MYRINGOTOMY AND TUBES      Family Psychiatric History: son; bipolar,  Mom : depression Dad ; alcohol use  Family History:  Family History  Problem Relation Age of Onset   Arthritis Mother    Depression Mother    Diabetes Mother    Hypertension Mother    Alcohol abuse Father    Hypertension Father    Heart disease Father 2       Massive MI   Diabetes Brother    Diabetes Brother    Heart disease Brother    Diabetes Brother        Type I     Social History:   Social History   Socioeconomic History   Marital status: Married    Spouse name: Legrand Como   Number of children: 4   Years of education: some college   Highest education level: Some college, no degree  Occupational History   Occupation: Retired  Tobacco Use   Smoking status: Never    Passive exposure: Never   Smokeless tobacco: Never  Vaping Use   Vaping Use: Never used  Substance and Sexual Activity  Alcohol use: Yes    Comment: socially on rare occasions   Drug use: No   Sexual activity: Yes    Birth control/protection: Other-see comments    Comment: postmenopausal  Other Topics Concern   Not on file  Social History Narrative   Not on file   Social Determinants of Health   Financial Resource Strain: Low Risk  (06/19/2020)   Overall Financial Resource Strain (CARDIA)    Difficulty of Paying Living Expenses: Not very hard  Food Insecurity: No Food Insecurity (02/10/2021)   Hunger Vital Sign    Worried About Running Out of Food in the Last Year: Never true    Ran Out of Food in the Last Year:  Never true  Transportation Needs: No Transportation Needs (02/10/2021)   PRAPARE - Hydrologist (Medical): No    Lack of Transportation (Non-Medical): No  Physical Activity: Insufficiently Active (02/10/2021)   Exercise Vital Sign    Days of Exercise per Week: 2 days    Minutes of Exercise per Session: 20 min  Stress: No Stress Concern Present (02/10/2021)   [place]    Feeling of Stress : Not at all  Social Connections: Moderately Integrated (02/10/2021)   Social Connection and Isolation Panel [NHANES]    Frequency of Communication with Friends and Family: More than three times a week    Frequency of Social Gatherings with Friends and Family: Three times a week    Attends Religious Services: More than 4 times per year    Active Member of Clubs or Organizations: No    Attends Archivist Meetings: Never    Marital Status: Married    Additional Social History: grew up with parents, dad was alcoholic, some challenges  Married for 1 years now  Allergies:   Allergies  Allergen Reactions   Prozac [Fluoxetine Hcl] Anaphylaxis   Codeine Other (See Comments)    Intolerance    Erythromycin Other (See Comments)   Other     -mycin medications: has myasthenia gravis    Sulfa Antibiotics Hives    Metabolic Disorder Labs: Lab Results  Component Value Date   HGBA1C 7.1 (A) 12/07/2022   MPG 148 12/28/2020   MPG 148 08/24/2020   No results found for: "PROLACTIN" Lab Results  Component Value Date   CHOL 246 (H) 08/02/2022   TRIG 422 (H) 08/02/2022   HDL 44 08/02/2022   CHOLHDL 5.6 (H) 08/02/2022   LDLCALC 127 (H) 08/02/2022   Post 68 11/10/2021   Lab Results  Component Value Date   TSH 2.570 08/02/2022    Therapeutic Level Labs: No results found for: "LITHIUM" No results found for: "CBMZ" No results found for: "VALPROATE"  Current Medications: Current Outpatient  Medications  Medication Sig Dispense Refill   Blood Glucose Monitoring Suppl (BLOOD GLUCOSE SYSTEM PAK) KIT Use as directed to monitor FSBS 2x daily. Dx: E11.9 1 each 1   Cholecalciferol (VITAMIN D3) 125 MCG (5000 UT) TABS Take 5,000 Units by mouth daily.     FARXIGA 10 MG TABS tablet TAKE 1 TABLET(10 MG) BY MOUTH DAILY BEFORE BREAKFAST 90 tablet 2   glucose blood (ONETOUCH VERIO) test strip USE TO TEST BLOOD SUGAR TWICE DAILY 100 strip 3   glucose blood (ONETOUCH VERIO) test strip USE TO TEST BLOOD SUGAR TWICE DAILY 100 strip 3   Glycerin-Hypromellose-PEG 400 (DRY EYE RELIEF DROPS) 0.2-0.2-1 % SOLN Place 1 drop into both eyes daily  as needed (Dry eye).     hyoscyamine (LEVSIN SL) 0.125 MG SL tablet Place 1 tablet (0.125 mg total) under the tongue every 6 (six) hours as needed (spasms). 30 tablet 1   losartan-hydrochlorothiazide (HYZAAR) 100-12.5 MG tablet Take 1 tablet by mouth daily. 90 tablet 1   metoprolol succinate (TOPROL-XL) 25 MG 24 hr tablet Take 1 tablet (25 mg total) by mouth daily. 90 tablet 1   Multiple Vitamins-Minerals (MULTI COMPLETE) CAPS Take 1 capsule by mouth daily.     pantoprazole (PROTONIX) 40 MG tablet Take 1 tablet (40 mg total) by mouth daily. 90 tablet 3   rosuvastatin (CRESTOR) 10 MG tablet TAKE 1 TABLET(10 MG) BY MOUTH DAILY 90 tablet 1   VASCEPA 1 g capsule Take 2 capsules (2 g total) by mouth 2 (two) times daily. 360 capsule 3   buPROPion (WELLBUTRIN SR) 200 MG 12 hr tablet Take 1 tablet (200 mg total) by mouth daily. 30 tablet 1   No current facility-administered medications for this visit.    Psychiatric Specialty Exam: Review of Systems  Cardiovascular:  Negative for chest pain.  Neurological:  Negative for tremors.  Psychiatric/Behavioral:  Negative for agitation and self-injury.     Blood pressure 128/80, pulse 70, temperature 98.4 F (36.9 C), height 5' 1.75" (1.568 m), weight 187 lb (84.8 kg), SpO2 97 %.Body mass index is 34.48 kg/m.  General  Appearance: Casual  Eye Contact:  Fair  Speech:  Clear and Coherent  Volume:  Normal  Moods: fair   Affect:  Congruent  Thought Process:  Goal Directed  Orientation:  Full (Time, Place, and Person)  Thought Content:  Rumination  Suicidal Thoughts:  No  Homicidal Thoughts:  No  Memory:  Immediate;   Fair  Judgement:  Fair  Insight:  Fair  Psychomotor Activity:  Normal  Concentration:  Concentration: Fair  Recall:  [place]: Good  Akathisia:  No  Handed:    AIMS (if indicated):  not done  Assets:  Desire for Improvement Financial Resources/Insurance Housing Leisure Time  ADL's:  Intact  Cognition: WNL  Sleep:  Fair   Screenings: GAD-7    [place] Office Visit from 12/07/2022 in [facility] Office Visit from 03/30/2022 in [facility] Office Visit from 02/22/2022 in [facility] Office Visit from 08/31/2021 in [facility] Office Visit from 08/19/2021 in [facility]  Total GAD-7 Score '5 12 21 6 13      '$ PHQ2-9    [place] Office Visit from 12/07/2022 in [facility] Office Visit from 08/02/2022 in [facility] Counselor from 06/09/2022 in [place] at [place] Visit from 06/02/2022 in [facility] Office Visit from 04/21/2022 in [place] at [facility]  PHQ-2 Total Score '2 2 2 2 4  '$ PHQ-9 Total Score '6 13 6 6 10      '$ [place] Office Visit from 08/04/2022 in [place] at [place] from 06/09/2022 in [place] at [place] Visit from 06/02/2022 in [place] at [place] No Risk No Risk Error: Q3, 4, or 5  should not be populated when Q2 is No       Assessment and Plan: as follows   Prior  documentation reviewed  Major depressive disorder recurrent moderate; manageable continue wellbutrin  Generalized anxiety disorder; fair, continue coping skills and therapy if needed  Provided supportive therapy  Grief : provided supportive therapy, handling it fair Fu 62m Refills sent Direct care time spent 20 minutes  including face to face , chart review   NMerian Capron MD 2/29/202410:42 AMPatient ID: VArther Abbott female   DOB: [birth year] 72y.o.   MRN: [id number]

## 2022-12-29 NOTE — Progress Notes (Signed)
   THERAPIST PROGRESS NOTE  Session Time: 2:00 pm-2:45 pm  Type of Therapy: Individual Therapy  Session#3  Purpose of Session: Tharon will manage mood and anxiety by increasing coping skills for anxious and depressive symptoms, challenge negative thoughts, process and let go of the past, increase physical activity and social interaction for 5 out of 7 days for 60 days.  ProgressTowards Goals: Progressing  Interventions: Therapist utilized CBT, Solution focused brief therapy, and ACT to address anxiety and depression. Therapist provided support and empathy to patient during session. Therapist administered the PHQ9 and GAD7 to patient. Therapist processed patient's grief at the loss of her daughter in law and how it impacts her mood. Therapist worked with patient on taking small steps and being ok with not doing as much as usual.     Effectiveness: Patient was oriented x4 (person, place, situation, and time). Patient was alert, engaged, pleasant, and cooperative. Patient was casually dressed, and appropriately groomed. Patient completed the PHQ9 with a score 12 of indicating depressive symptoms. Patient completed the GAD7 with a score of12  indicating moderate anxious symptoms. Patient experienced a loss in her family in Feb. 1. Patient's daughter in law [place] passed away. Patient is grieving the loss. She is worried about her son. She feels like her son may be triggered and have a depressive episode which she feels like she wouldn't know what to do with that. Patient is beating herself up because she is not doing as much as she feels she should or keeping her routine. Patient noted that she is taking care of herself physically by eating even when she has no appetite, taking care of her hygiene, and trying to move throughout the day. Patient is attending to her spiritual needs by attending church, bible study, and volunteering/proselyting 1 time a week even though she feels like she should be doing 3-4  days a week. Patient is visiting friends but doesn't get the same joy or mood boost as before. Patient feels guilty if she has a good day due to the family experiencing a loss. Patient understood that some of her expectations are high for herself right now and she needs to give herself grace. Patient understood that moving forward in grief bounces between grieving and feeling ok. She understood she will have days she will grieve and other days she will do things to be "ok" or distract herself.    Patient engaged in session. She responded well to interventions. Patient continues to meet criteria for Major depressive disorder, recurrent episode, moderate and Generalized Anxiety Disorder. Patient will continue in outpatient therapy due to being the least restrictive service to meet her needs. Patient made minimal progress on her goals at this time.   Suicidal/Homicidal: Nowithout intent/plan  Plan: Return again in 2-4 weeks.  Diagnosis: MDD (major depressive disorder), recurrent episode, moderate (HCC)  GAD (generalized anxiety disorder)  Collaboration of Care: Psychiatrist AEB Dr. Merian Capron  Patient/Guardian was advised Release of Information must be obtained prior to any record release in order to collaborate their care with an outside provider. Patient/Guardian was advised if they have not already done so to contact the registration department to sign all necessary forms in order for [place] to release information regarding their care.   Consent: Patient/Guardian gives verbal consent for treatment and assignment of benefits for services provided during this visit. Patient/Guardian expressed understanding and agreed to proceed.   Glori Bickers, LCSW 12/29/2022

## 2023-01-10 ENCOUNTER — Other Ambulatory Visit: Payer: Self-pay | Admitting: Internal Medicine

## 2023-01-10 DIAGNOSIS — E1169 Type 2 diabetes mellitus with other specified complication: Secondary | ICD-10-CM

## 2023-01-26 ENCOUNTER — Ambulatory Visit (HOSPITAL_COMMUNITY): Payer: Medicare Other | Admitting: Licensed Clinical Social Worker

## 2023-02-20 ENCOUNTER — Encounter: Payer: Medicare Other | Admitting: Internal Medicine

## 2023-02-23 DIAGNOSIS — G4733 Obstructive sleep apnea (adult) (pediatric): Secondary | ICD-10-CM | POA: Diagnosis not present

## 2023-02-27 ENCOUNTER — Ambulatory Visit (INDEPENDENT_AMBULATORY_CARE_PROVIDER_SITE_OTHER): Payer: Medicare Other | Admitting: Gastroenterology

## 2023-02-27 ENCOUNTER — Encounter (INDEPENDENT_AMBULATORY_CARE_PROVIDER_SITE_OTHER): Payer: Self-pay | Admitting: Gastroenterology

## 2023-02-27 VITALS — BP 121/72 | HR 70 | Temp 98.3°F | Ht 61.75 in | Wt 187.9 lb

## 2023-02-27 DIAGNOSIS — K5900 Constipation, unspecified: Secondary | ICD-10-CM | POA: Diagnosis not present

## 2023-02-27 DIAGNOSIS — K219 Gastro-esophageal reflux disease without esophagitis: Secondary | ICD-10-CM | POA: Diagnosis not present

## 2023-02-27 DIAGNOSIS — K224 Dyskinesia of esophagus: Secondary | ICD-10-CM

## 2023-02-27 NOTE — Patient Instructions (Signed)
I have provided the TIF brochure for you to look over, I will have Dr. Levon Hedger reach out to further discuss this  We will schedule upper endoscopy in preparation of the TIF Procedure  Continue protonix 40mg  daily, try to stay upright 2-3 hours after eating before bending or lying down Continue with fiber and good water intake  As discussed, can Consider anorectal manometry if constipation persists as this may be more a muscle issue contributing  You can Can taking levsin as needed for esophageal spasms If you have worsening nausea/right upper quadrant pain, please let me know  Follow up 3 months  It was a pleasure to see you today. I want to create trusting relationships with patients and provide genuine, compassionate, and quality care. I truly value your feedback! please be on the lookout for a survey regarding your visit with me today. I appreciate your input about our visit and your time in completing this!    Carolyn [name] L. Jeanmarie Hubert, MSN, APRN, AGNP-C Adult-Gerontology Nurse Practitioner [facility]

## 2023-02-27 NOTE — H&P (View-Only) (Signed)
 Referring Provider: Patel, Rutwik K, MD Primary Care Physician:  Patel, Rutwik K, MD Primary GI Physician: Castaneda   Chief Complaint  Patient presents with   Irritable Bowel Syndrome    Follow up on IBS - C. Still has to strain to have BM. Takes one tablespoon tid of fiber.    Gastroesophageal Reflux    Follow up on GERD. Takes protonix every day and still having some symptoms.    HPI:   Carolyn [name] is a 72 y.o. female with past medical history of DM, depression HLD, Myasthenia Gravis and IBS   Patient presenting today for follow up on IBS-C and GERD.  Last seen November for hemorrhoid banding, this was her third banding. At that time doing beneftiber 1T 2-3x/day, still having some constipation, going up to 2 days without a BM. Miralax PRN helped some with her stooling   Present:  Doing somewhat better with constipation, She notes she is still taking fiber though often has to strain even when stools are not hard. She is having a BM almost daily. Stools are soft, can be loose to soft. She denies any hard stools. Denies any abdominal pain, or rectal bleeding. Water intake is good. She is also using a squatty potty as well.  She is on protonix 40mg daily, she notes that she is having some issues where she may have some reflux if she bends over too soon after eating. Occasionally will have heartburn if she eats something that is a trigger for her, symptoms occurring maybe 1-2x/week. She is not taking anything else for her symptoms. She reports she was diagnosed with esophageal spasms in the past, noting she will feel this occur a lot more with water than food (room temperature water) she notes that it is not occurring on a regular basis but is still happening. Will feel some pain in epigastric region and like water takes a minute to pass. She feels these symptoms improved after she started PPI.  She is on levsin for her stomach but has not used this for her esophageal spasms. She is  interested in the TIF procedure to potentially get off of her PPI.  She had myasthenia gravis in the past, and is concerned this could be contributing to some of her GI symptoms even though she was told that her MG was not active. She has not seen neurology in a while.   Last EGD: 11/11/2020, there was presence of some gastric erosions in the antrum (biopsies taken to rule out H. pylori which were negative, no presence of GIM or dysplasia), normal duodenum (negative for celiac disease). Last Colonoscopy: 11/11/2020, normal colon with presence of internal hemorrhoids   Past Medical History:  Diagnosis Date   Depression    Diabetes mellitus without complication (HCC)    Diastolic dysfunction    Hyperlipidemia    Myasthenia gravis (HCC)    Patient is Jehovah's Witness    Pulmonary embolism (HCC) 05/18/2021   Sleep apnea     Past Surgical History:  Procedure Laterality Date   APPENDECTOMY     BIOPSY  11/11/2020   Procedure: BIOPSY;  Surgeon: Castaneda Mayorga, Daniel, MD;  Location: [facility];  Service: Gastroenterology;;  duodenum gastric   BUNIONECTOMY     COLONOSCOPY WITH PROPOFOL N/A 11/11/2020   Procedure: COLONOSCOPY WITH PROPOFOL;  Surgeon: Castaneda Mayorga, Daniel, MD;  Location: [facility];  Service: Gastroenterology;  Laterality: N/A;  11:15   ESOPHAGOGASTRODUODENOSCOPY (EGD) WITH PROPOFOL N/A 11/11/2020     Procedure: ESOPHAGOGASTRODUODENOSCOPY (EGD) WITH PROPOFOL;  Surgeon: Castaneda Mayorga, Daniel, MD;  Location: [facility];  Service: Gastroenterology;  Laterality: N/A;  to arrive at 0845   MANDIBLE SURGERY     TONSILECTOMY, ADENOIDECTOMY, BILATERAL MYRINGOTOMY AND TUBES      Current Outpatient Medications  Medication Sig Dispense Refill   Blood Glucose Monitoring Suppl (BLOOD GLUCOSE SYSTEM PAK) KIT Use as directed to monitor FSBS 2x daily. Dx: E11.9 1 each 1   buPROPion (WELLBUTRIN SR) 200 MG 12 hr tablet Take 1 tablet (200 mg total) by mouth daily. 30 tablet 1    Cholecalciferol (VITAMIN D3) 125 MCG (5000 UT) TABS Take 5,000 Units by mouth daily.     FARXIGA 10 MG TABS tablet TAKE 1 TABLET(10 MG) BY MOUTH DAILY BEFORE BREAKFAST 90 tablet 2   glucose blood (ONETOUCH VERIO) test strip USE TO TEST BLOOD SUGAR TWICE DAILY 100 strip 3   glucose blood (ONETOUCH VERIO) test strip USE TO TEST BLOOD SUGAR TWICE DAILY 100 strip 3   Glycerin-Hypromellose-PEG 400 (DRY EYE RELIEF DROPS) 0.2-0.2-1 % SOLN Place 1 drop into both eyes daily as needed (Dry eye).     hyoscyamine (LEVSIN SL) 0.125 MG SL tablet Place 1 tablet (0.125 mg total) under the tongue every 6 (six) hours as needed (spasms). 30 tablet 1   losartan-hydrochlorothiazide (HYZAAR) 100-12.5 MG tablet Take 1 tablet by mouth daily. 90 tablet 1   metoprolol succinate (TOPROL-XL) 25 MG 24 hr tablet Take 1 tablet (25 mg total) by mouth daily. 90 tablet 1   Multiple Vitamins-Minerals (MULTI COMPLETE) CAPS Take 1 capsule by mouth daily.     pantoprazole (PROTONIX) 40 MG tablet Take 1 tablet (40 mg total) by mouth daily. 90 tablet 3   rosuvastatin (CRESTOR) 10 MG tablet TAKE 1 TABLET(10 MG) BY MOUTH DAILY 90 tablet 1   VASCEPA 1 g capsule Take 2 capsules (2 g total) by mouth 2 (two) times daily. 360 capsule 3   No current facility-administered medications for this visit.    Allergies as of 02/27/2023 - Review Complete 02/27/2023  Allergen Reaction Noted   Prozac [fluoxetine hcl] Anaphylaxis 12/16/2015   Codeine Other (See Comments) 12/16/2015   Erythromycin Other (See Comments) 12/16/2015   Other  10/30/2017   Sulfa antibiotics Hives 12/16/2015    Family History  Problem Relation Age of Onset   Arthritis Mother    Depression Mother    Diabetes Mother    Hypertension Mother    Alcohol abuse Father    Hypertension Father    Heart disease Father 61       Massive MI   Diabetes Brother    Diabetes Brother    Heart disease Brother    Diabetes Brother        Type I     Social History    Socioeconomic History   Marital status: Married    Spouse name: Michael   Number of children: 4   Years of education: some college   Highest education level: Some college, no degree  Occupational History   Occupation: Retired  Tobacco Use   Smoking status: Never    Passive exposure: Never   Smokeless tobacco: Never  Vaping Use   Vaping Use: Never used  Substance and Sexual Activity   Alcohol use: Yes    Comment: socially on rare occasions   Drug use: No   Sexual activity: Yes    Birth control/protection: Other-see comments    Comment: postmenopausal  Other Topics Concern     Not on file  Social History Narrative   Not on file   Social Determinants of Health   Financial Resource Strain: Low Risk  (06/19/2020)   Overall Financial Resource Strain (CARDIA)    Difficulty of Paying Living Expenses: Not very hard  Food Insecurity: No Food Insecurity (02/10/2021)   Hunger Vital Sign    Worried About Running Out of Food in the Last Year: Never true    Ran Out of Food in the Last Year: Never true  Transportation Needs: No Transportation Needs (02/10/2021)   PRAPARE - Transportation    Lack of Transportation (Medical): No    Lack of Transportation (Non-Medical): No  Physical Activity: Insufficiently Active (02/10/2021)   Exercise Vital Sign    Days of Exercise per Week: 2 days    Minutes of Exercise per Session: 20 min  Stress: No Stress Concern Present (02/10/2021)   [employer] - Occupational Stress Questionnaire    Feeling of Stress : Not at all  Social Connections: Moderately Integrated (02/10/2021)   Social Connection and Isolation Panel [NHANES]    Frequency of Communication with Friends and Family: More than three times a week    Frequency of Social Gatherings with Friends and Family: Three times a week    Attends Religious Services: More than 4 times per year    Active Member of Clubs or Organizations: No    Attends Club or Organization  Meetings: Never    Marital Status: Married   Review of systems General: negative for malaise, night sweats, fever, chills, weight loss Neck: Negative for lumps, goiter, pain and significant neck swelling Resp: Negative for cough, wheezing, dyspnea at rest CV: Negative for chest pain, leg swelling, palpitations, orthopnea GI: denies melena, hematochezia, nausea, vomiting, diarrhea,  dysphagia, odyonophagia, early satiety or unintentional weight loss. +constipation +esophageal spasms +acid regurgitation MSK: Negative for joint pain or swelling, back pain, and muscle pain. Derm: Negative for itching or rash Psych: Denies depression, anxiety, memory loss, confusion. No homicidal or suicidal ideation.  Heme: Negative for prolonged bleeding, bruising easily, and swollen nodes. Endocrine: Negative for cold or heat intolerance, polyuria, polydipsia and goiter. Neuro: negative for tremor, gait imbalance, syncope and seizures. The remainder of the review of systems is noncontributory.  Physical Exam: BP 121/72 (BP Location: Left Arm, Patient Position: Sitting, Cuff Size: Large)   Pulse 70   Temp 98.3 F (36.8 C) (Oral)   Ht 5' 1.75" (1.568 m)   Wt 187 lb 14.4 oz (85.2 kg)   BMI 34.65 kg/m  General:   Alert and oriented. No distress noted. Pleasant and cooperative.  Head:  Normocephalic and atraumatic. Eyes:  Conjuctiva clear without scleral icterus. Mouth:  Oral mucosa pink and moist. Good dentition. No lesions. Heart: Normal rate and rhythm, s1 and s2 heart sounds present.  Lungs: Clear lung sounds in all lobes. Respirations equal and unlabored. Abdomen:  +BS, soft, non-tender and non-distended. No rebound or guarding. No HSM or masses noted. Derm: No palmar erythema or jaundice Msk:  Symmetrical without gross deformities. Normal posture. Extremities:  Without edema. Neurologic:  Alert and  oriented x4 Psych:  Alert and cooperative. Normal mood and affect.  Invalid input(s): "6 MONTHS"    ASSESSMENT: Adalind L Denner is a 71 y.o. female presenting today for follow up of IBS-C and GERD.  IBS-C: feels some improvement in constipation doing fiber and good water intake, stools are soft to loose though still notes having to strain to defecate at times.   Fairly recent Colonoscopy in 2022 without findings to explain constipation. Query if there is some pelvic floor dyssenergia contributing to her symptoms. We discussed anorectal manometry for further evaluation, patient would like to hold off on this for now. She will let me know if she decides she wants to pursue this.   GERD: on protonix 40mg daily, notes symptoms usually only if she bends over too soon after eating, occasionally if she eats a trigger food. For now will continue with protonix but needs to ensure she is practicing good reflux precautions, especially staying upright 2-3 hours after eating. She Is interested in the TIF procedure, will schedule her for updated EGD, provide TIF brochure and make Dr. Castaneda aware so that he can reach out to discuss this further   Esophageal spasms: reports previously diagnosed with esophageal spasms, had some improvement with use of PPI though still having these intermittently, mostly with room temperature water. We are updating her upper endoscopy as above, discussed that she can try using her levsin PRN when this occurs to see if this provides any relief.   Indications, risks and benefits of procedure discussed in detail with patient. Patient verbalized understanding and is in agreement to proceed with EGD     PLAN:  TIF brochure, Dr. Castaneda to reach out to discuss TIF further  2. Schedule EGD ASA II  3. Continue with fiber and good water intake  4. Consider anorectal manometry 5. Can take levsin PRN for esophageal spasms 6. Stay upright 2-3 hours after eating  All questions were answered, patient verbalized understanding and is in agreement with plan as outlined above.   Follow  Up: 3 months   Cayman Kielbasa L. Tiwanda Threats, MSN, APRN, AGNP-C Adult-Gerontology Nurse Practitioner [facility]  I have reviewed the note and agree with the APP's assessment as described in this progress note  Will discuss TIF prior to EGD and determine candidacy depending on findings  Daniel Castaneda, MD Gastroenterology and Hepatology [facility] 

## 2023-02-27 NOTE — Progress Notes (Addendum)
Referring Provider: Anabel Halon, MD Primary Care Physician:  Anabel Halon, MD Primary GI Physician: Levon Hedger   Chief Complaint  Patient presents with   Irritable Bowel Syndrome    Follow up on IBS - C. Still has to strain to have BM. Takes one tablespoon tid of fiber.    Gastroesophageal Reflux    Follow up on GERD. Takes protonix every day and still having some symptoms.    HPI:   Carolyn [name] is a 72 y.o. female with past medical history of DM, depression HLD, Myasthenia Gravis and IBS   Patient presenting today for follow up on IBS-C and GERD.  Last seen November for hemorrhoid banding, this was her third banding. At that time doing beneftiber 1T 2-3x/day, still having some constipation, going up to 2 days without a BM. Miralax PRN helped some with her stooling   Present:  Doing somewhat better with constipation, She notes she is still taking fiber though often has to strain even when stools are not hard. She is having a BM almost daily. Stools are soft, can be loose to soft. She denies any hard stools. Denies any abdominal pain, or rectal bleeding. Water intake is good. She is also using a squatty potty as well.  She is on protonix 40mg  daily, she notes that she is having some issues where she may have some reflux if she bends over too soon after eating. Occasionally will have heartburn if she eats something that is a trigger for her, symptoms occurring maybe 1-2x/week. She is not taking anything else for her symptoms. She reports she was diagnosed with esophageal spasms in the past, noting she will feel this occur a lot more with water than food (room temperature water) she notes that it is not occurring on a regular basis but is still happening. Will feel some pain in epigastric region and like water takes a minute to pass. She feels these symptoms improved after she started PPI.  She is on levsin for her stomach but has not used this for her esophageal spasms. She is  interested in the TIF procedure to potentially get off of her PPI.  She had myasthenia gravis in the past, and is concerned this could be contributing to some of her GI symptoms even though she was told that her MG was not active. She has not seen neurology in a while.   Last EGD: 11/11/2020, there was presence of some gastric erosions in the antrum (biopsies taken to rule out H. pylori which were negative, no presence of GIM or dysplasia), normal duodenum (negative for celiac disease). Last Colonoscopy: 11/11/2020, normal colon with presence of internal hemorrhoids   Past Medical History:  Diagnosis Date   Depression    Diabetes mellitus without complication (HCC)    Diastolic dysfunction    Hyperlipidemia    Myasthenia gravis (HCC)    Patient is Jehovah's Witness    Pulmonary embolism (HCC) 05/18/2021   Sleep apnea     Past Surgical History:  Procedure Laterality Date   APPENDECTOMY     BIOPSY  11/11/2020   Procedure: BIOPSY;  Surgeon: [name], MD;  Location: [facility];  Service: Gastroenterology;;  duodenum gastric   BUNIONECTOMY     COLONOSCOPY WITH PROPOFOL N/A 11/11/2020   Procedure: COLONOSCOPY WITH PROPOFOL;  Surgeon: [name], MD;  Location: [facility];  Service: Gastroenterology;  Laterality: N/A;  11:15   ESOPHAGOGASTRODUODENOSCOPY (EGD) WITH PROPOFOL N/A 11/11/2020  Procedure: ESOPHAGOGASTRODUODENOSCOPY (EGD) WITH PROPOFOL;  Surgeon: [name], MD;  Location: [facility];  Service: Gastroenterology;  Laterality: N/A;  to arrive at 0845   MANDIBLE SURGERY     TONSILECTOMY, ADENOIDECTOMY, BILATERAL MYRINGOTOMY AND TUBES      Current Outpatient Medications  Medication Sig Dispense Refill   Blood Glucose Monitoring Suppl (BLOOD GLUCOSE SYSTEM PAK) KIT Use as directed to monitor FSBS 2x daily. Dx: E11.9 1 each 1   buPROPion (WELLBUTRIN SR) 200 MG 12 hr tablet Take 1 tablet (200 mg total) by mouth daily. 30 tablet 1    Cholecalciferol (VITAMIN D3) 125 MCG (5000 UT) TABS Take 5,000 Units by mouth daily.     FARXIGA 10 MG TABS tablet TAKE 1 TABLET(10 MG) BY MOUTH DAILY BEFORE BREAKFAST 90 tablet 2   glucose blood (ONETOUCH VERIO) test strip USE TO TEST BLOOD SUGAR TWICE DAILY 100 strip 3   glucose blood (ONETOUCH VERIO) test strip USE TO TEST BLOOD SUGAR TWICE DAILY 100 strip 3   Glycerin-Hypromellose-PEG 400 (DRY EYE RELIEF DROPS) 0.2-0.2-1 % SOLN Place 1 drop into both eyes daily as needed (Dry eye).     hyoscyamine (LEVSIN SL) 0.125 MG SL tablet Place 1 tablet (0.125 mg total) under the tongue every 6 (six) hours as needed (spasms). 30 tablet 1   losartan-hydrochlorothiazide (HYZAAR) 100-12.5 MG tablet Take 1 tablet by mouth daily. 90 tablet 1   metoprolol succinate (TOPROL-XL) 25 MG 24 hr tablet Take 1 tablet (25 mg total) by mouth daily. 90 tablet 1   Multiple Vitamins-Minerals (MULTI COMPLETE) CAPS Take 1 capsule by mouth daily.     pantoprazole (PROTONIX) 40 MG tablet Take 1 tablet (40 mg total) by mouth daily. 90 tablet 3   rosuvastatin (CRESTOR) 10 MG tablet TAKE 1 TABLET(10 MG) BY MOUTH DAILY 90 tablet 1   VASCEPA 1 g capsule Take 2 capsules (2 g total) by mouth 2 (two) times daily. 360 capsule 3   No current facility-administered medications for this visit.    Allergies as of 02/27/2023 - Review Complete 02/27/2023  Allergen Reaction Noted   Prozac [fluoxetine hcl] Anaphylaxis 12/16/2015   Codeine Other (See Comments) 12/16/2015   Erythromycin Other (See Comments) 12/16/2015   Other  10/30/2017   Sulfa antibiotics Hives 12/16/2015    Family History  Problem Relation Age of Onset   Arthritis Mother    Depression Mother    Diabetes Mother    Hypertension Mother    Alcohol abuse Father    Hypertension Father    Heart disease Father 40       Massive MI   Diabetes Brother    Diabetes Brother    Heart disease Brother    Diabetes Brother        Type I     Social History    Socioeconomic History   Marital status: Married    Spouse name: Casimiro Needle   Number of children: 4   Years of education: some college   Highest education level: Some college, no degree  Occupational History   Occupation: Retired  Tobacco Use   Smoking status: Never    Passive exposure: Never   Smokeless tobacco: Never  Vaping Use   Vaping Use: Never used  Substance and Sexual Activity   Alcohol use: Yes    Comment: socially on rare occasions   Drug use: No   Sexual activity: Yes    Birth control/protection: Other-see comments    Comment: postmenopausal  Other Topics Concern  Not on file  Social History Narrative   Not on file   Social Determinants of Health   Financial Resource Strain: Low Risk  (06/19/2020)   Overall Financial Resource Strain (CARDIA)    Difficulty of Paying Living Expenses: Not very hard  Food Insecurity: No Food Insecurity (02/10/2021)   Hunger Vital Sign    Worried About Running Out of Food in the Last Year: Never true    Ran Out of Food in the Last Year: Never true  Transportation Needs: No Transportation Needs (02/10/2021)   PRAPARE - Administrator, Civil Service (Medical): No    Lack of Transportation (Non-Medical): No  Physical Activity: Insufficiently Active (02/10/2021)   Exercise Vital Sign    Days of Exercise per Week: 2 days    Minutes of Exercise per Session: 20 min  Stress: No Stress Concern Present (02/10/2021)   [employer] of Occupational Health - Occupational Stress Questionnaire    Feeling of Stress : Not at all  Social Connections: Moderately Integrated (02/10/2021)   Social Connection and Isolation Panel [NHANES]    Frequency of Communication with Friends and Family: More than three times a week    Frequency of Social Gatherings with Friends and Family: Three times a week    Attends Religious Services: More than 4 times per year    Active Member of Clubs or Organizations: No    Attends Banker  Meetings: Never    Marital Status: Married   Review of systems General: negative for malaise, night sweats, fever, chills, weight loss Neck: Negative for lumps, goiter, pain and significant neck swelling Resp: Negative for cough, wheezing, dyspnea at rest CV: Negative for chest pain, leg swelling, palpitations, orthopnea GI: denies melena, hematochezia, nausea, vomiting, diarrhea,  dysphagia, odyonophagia, early satiety or unintentional weight loss. +constipation +esophageal spasms +acid regurgitation MSK: Negative for joint pain or swelling, back pain, and muscle pain. Derm: Negative for itching or rash Psych: Denies depression, anxiety, memory loss, confusion. No homicidal or suicidal ideation.  Heme: Negative for prolonged bleeding, bruising easily, and swollen nodes. Endocrine: Negative for cold or heat intolerance, polyuria, polydipsia and goiter. Neuro: negative for tremor, gait imbalance, syncope and seizures. The remainder of the review of systems is noncontributory.  Physical Exam: BP 121/72 (BP Location: Left Arm, Patient Position: Sitting, Cuff Size: Large)   Pulse 70   Temp 98.3 F (36.8 C) (Oral)   Ht 5' 1.75" (1.568 m)   Wt 187 lb 14.4 oz (85.2 kg)   BMI 34.65 kg/m  General:   Alert and oriented. No distress noted. Pleasant and cooperative.  Head:  Normocephalic and atraumatic. Eyes:  Conjuctiva clear without scleral icterus. Mouth:  Oral mucosa pink and moist. Good dentition. No lesions. Heart: Normal rate and rhythm, s1 and s2 heart sounds present.  Lungs: Clear lung sounds in all lobes. Respirations equal and unlabored. Abdomen:  +BS, soft, non-tender and non-distended. No rebound or guarding. No HSM or masses noted. Derm: No palmar erythema or jaundice Msk:  Symmetrical without gross deformities. Normal posture. Extremities:  Without edema. Neurologic:  Alert and  oriented x4 Psych:  Alert and cooperative. Normal mood and affect.  Invalid input(s): "6 MONTHS"    ASSESSMENT: Carolyn [name] is a 72 y.o. female presenting today for follow up of IBS-C and GERD.  IBS-C: feels some improvement in constipation doing fiber and good water intake, stools are soft to loose though still notes having to strain to defecate at times.  Fairly recent Colonoscopy in 2022 without findings to explain constipation. Query if there is some pelvic floor dyssenergia contributing to her symptoms. We discussed anorectal manometry for further evaluation, patient would like to hold off on this for now. She will let me know if she decides she wants to pursue this.   GERD: on protonix 40mg  daily, notes symptoms usually only if she bends over too soon after eating, occasionally if she eats a trigger food. For now will continue with protonix but needs to ensure she is practicing good reflux precautions, especially staying upright 2-3 hours after eating. She Is interested in the TIF procedure, will schedule her for updated EGD, provide TIF brochure and make Dr. Levon Hedger aware so that he can reach out to discuss this further   Esophageal spasms: reports previously diagnosed with esophageal spasms, had some improvement with use of PPI though still having these intermittently, mostly with room temperature water. We are updating her upper endoscopy as above, discussed that she can try using her levsin PRN when this occurs to see if this provides any relief.   Indications, risks and benefits of procedure discussed in detail with patient. Patient verbalized understanding and is in agreement to proceed with EGD     PLAN:  TIF brochure, Dr. Levon Hedger to reach out to discuss TIF further  2. Schedule EGD ASA II  3. Continue with fiber and good water intake  4. Consider anorectal manometry 5. Can take levsin PRN for esophageal spasms 6. Stay upright 2-3 hours after eating  All questions were answered, patient verbalized understanding and is in agreement with plan as outlined above.   Follow  Up: 3 months   Trejan Buda L. Jeanmarie Hubert, MSN, APRN, AGNP-C Adult-Gerontology Nurse Practitioner [facility]  I have reviewed the note and agree with the APP's assessment as described in this progress note  Will discuss TIF prior to EGD and determine candidacy depending on findings  [name], MD Gastroenterology and Hepatology [facility]

## 2023-03-06 ENCOUNTER — Ambulatory Visit (INDEPENDENT_AMBULATORY_CARE_PROVIDER_SITE_OTHER): Payer: Medicare Other | Admitting: Internal Medicine

## 2023-03-06 ENCOUNTER — Encounter: Payer: Self-pay | Admitting: Internal Medicine

## 2023-03-06 DIAGNOSIS — Z0000 Encounter for general adult medical examination without abnormal findings: Secondary | ICD-10-CM

## 2023-03-06 NOTE — Patient Instructions (Signed)
[name] , Thank you for taking time to come for your Medicare Wellness Visit. I appreciate your ongoing commitment to your health goals. Please review the following plan we discussed and let me know if I can assist you in the future.   These are the goals we discussed:  Goals      Exercise 3x per week (30 min per time)     Patient Stated     Patient states that her goal is to continue taking care of herself.     Pharmacy Care Plan:     CARE PLAN ENTRY (see longitudinal plan of care for additional care plan information)  Current Barriers:  Chronic Disease Management support, education, and care coordination needs related to Hypertension, Hyperlipidemia, Diabetes, and Depression   Hypertension BP Readings from Last 3 Encounters:  06/16/20 138/82  01/22/20 (!) 142/86  04/22/19 122/64  Pharmacist Clinical Goal(s): Over the next 7 days, patient will work with PharmD and providers to maintain BP goal <130/80 Current regimen:  Losartan 100mg  daily Metoprolol Succinate XL 50mg  Interventions: Reviewed office Bps Comprehensive medication review Patient self care activities - Over the next 7 days, patient will: Check BP periodically, document, and provide at future appointments Ensure daily salt intake < 2300 mg/day  Hyperlipidemia Lab Results  Component Value Date/Time   [facility] 68 10/18/2019 08:07 AM  Pharmacist Clinical Goal(s): Over the next 7 days, patient will work with PharmD and providers to maintain LDL goal < 70 Current regimen:  Livalo 2mg  daily Interventions: Reviewed most recent lipid panel Discussed dietary modifications to help with TG Patient self care activities - Over the next 7 days, patient will: Continue to focus on medication adherence by pill count Focus on diet and exercise to bring TG's closer to goal  Diabetes Lab Results  Component Value Date/Time   HGBA1C 6.8 (H) 01/22/2020 03:57 PM   HGBA1C 7.4 (H) 10/18/2019 08:07 AM  Pharmacist Clinical  Goal(s): Over the next 7 days, patient will work with PharmD and providers to maintain A1c goal <7% Current regimen:  Metformin 500mg  daily with breakfast Interventions: Reviewed home blood sugar readings Reviewed last A1c Recommended repeat A1c Recommend twice daily monitoring for one week Counseled on diet and exercise modifications Patient self care activities - Over the next 7 days, patient will: Check blood sugar twice daily, document, and provide at future appointments Contact provider with any episodes of hypoglycemia Work on diet lower in carbohydrates and increase physical activity to 3 days weekly as tolerated Schedule a visit with Dr. Jeanice Lim for updated A1c  Depression Pharmacist Clinical Goal(s) Over the next 7 days, patient will work with PharmD and providers to optimize medication related to depression. Current regimen:  Wellbutrin XL 150mg  daily Lexapro 5mg  daily Interventions: Discussed current symptoms Reviewed current medication adherence. Patient self care activities - Over the next 7 days, patient will: Monitor herself for withdrawal symptoms such as headache, nausea, etc. Report any change in symptoms to providers   Initial goal documentation         This is a list of the screening recommended for you and due dates:  Health Maintenance  Topic Date Due   DTaP/Tdap/Td vaccine (1 - Tdap) Never done   COVID-19 Vaccine (6 - 2023-24 season) 07/01/2022   Medicare Annual Wellness Visit  02/15/2023   Mammogram  02/18/2023   Yearly kidney health urinalysis for diabetes  03/31/2023   Complete foot exam   03/31/2023   Flu Shot  06/01/2023   Hemoglobin  A1C  06/07/2023   Yearly kidney function blood test for diabetes  08/03/2023   Eye exam for diabetics  09/08/2023   Colon Cancer Screening  11/11/2030   Pneumonia Vaccine  Completed   DEXA scan (bone density measurement)  Completed   Hepatitis C Screening: USPSTF Recommendation to screen - Ages 46-79 yo.   Completed   Zoster (Shingles) Vaccine  Completed   HPV Vaccine  Aged Out

## 2023-03-06 NOTE — Progress Notes (Signed)
Subjective:  This is a telephone encounter between Carolyn [name] and Carolyn [name] on 03/06/2023 for AWV. The visit was conducted with the patient located at home and Carolyn [name] at [facility]. The patient's identity was confirmed using their DOB and current address. The patient has consented to being evaluated through a telephone encounter and understands the associated risks (an examination cannot be done and the patient may need to come in for an appointment) / benefits (allows the patient to remain at home, decreasing exposure to coronavirus).      Carolyn [name] is a 72 y.o. female who presents for Medicare Annual (Subsequent) preventive examination.  Review of Systems    Review of Systems  All other systems reviewed and are negative.   Objective:    Today's Vitals   03/06/23 0804  PainSc: 6    There is no height or weight on file to calculate BMI.     03/06/2023    8:06 AM 05/29/2022    6:41 AM 02/14/2022    2:13 PM 05/18/2021   10:42 AM 03/02/2021    8:55 AM 02/10/2021    1:16 PM 11/11/2020    8:59 AM  Advanced Directives  Does Patient Have a Medical Advance Directive? Yes No Yes No Yes Yes Yes  Type of Cytogeneticist of [place];Living will Healthcare Power of [place];Living will Healthcare Power of [place];Living will  Does patient want to make changes to medical advance directive?   No - Patient declined   No - Patient declined   Copy of Healthcare Power of Attorney in Chart?   Yes - validated most recent copy scanned in chart (See row information)  Yes - validated most recent copy scanned in chart (See row information) Yes - validated most recent copy scanned in chart (See row information)   Would patient like information on creating a medical advance directive?  No - Patient declined  No - Patient declined       Current Medications (verified) Outpatient Encounter Medications as of 03/06/2023  Medication Sig   Blood  Glucose Monitoring Suppl (BLOOD GLUCOSE SYSTEM PAK) KIT Use as directed to monitor FSBS 2x daily. Dx: E11.9   buPROPion (WELLBUTRIN SR) 200 MG 12 hr tablet Take 1 tablet (200 mg total) by mouth daily.   Cholecalciferol (VITAMIN D3) 125 MCG (5000 UT) TABS Take 5,000 Units by mouth daily.   FARXIGA 10 MG TABS tablet TAKE 1 TABLET(10 MG) BY MOUTH DAILY BEFORE BREAKFAST   glucose blood (ONETOUCH VERIO) test strip USE TO TEST BLOOD SUGAR TWICE DAILY   glucose blood (ONETOUCH VERIO) test strip USE TO TEST BLOOD SUGAR TWICE DAILY   Glycerin-Hypromellose-PEG 400 (DRY EYE RELIEF DROPS) 0.2-0.2-1 % SOLN Place 1 drop into both eyes daily as needed (Dry eye).   hyoscyamine (LEVSIN SL) 0.125 MG SL tablet Place 1 tablet (0.125 mg total) under the tongue every 6 (six) hours as needed (spasms).   losartan-hydrochlorothiazide (HYZAAR) 100-12.5 MG tablet Take 1 tablet by mouth daily.   metoprolol succinate (TOPROL-XL) 25 MG 24 hr tablet Take 1 tablet (25 mg total) by mouth daily.   Multiple Vitamins-Minerals (MULTI COMPLETE) CAPS Take 1 capsule by mouth daily.   pantoprazole (PROTONIX) 40 MG tablet Take 1 tablet (40 mg total) by mouth daily.   rosuvastatin (CRESTOR) 10 MG tablet TAKE 1 TABLET(10 MG) BY MOUTH DAILY   VASCEPA 1 g capsule Take 2 capsules (2 g total) by mouth  2 (two) times daily.   [DISCONTINUED] DULoxetine (CYMBALTA) 20 MG capsule Take 1 capsule (20 mg total) by mouth daily. (Patient not taking: Reported on 05/26/2022)   No facility-administered encounter medications on file as of 03/06/2023.    Allergies (verified) Prozac [fluoxetine hcl], Codeine, Erythromycin, Other, and Sulfa antibiotics   History: Past Medical History:  Diagnosis Date   Depression    Diabetes mellitus without complication (HCC)    Diastolic dysfunction    Hyperlipidemia    Myasthenia gravis (HCC)    Patient is Jehovah's Witness    Pulmonary embolism (HCC) 05/18/2021   Sleep apnea    Past Surgical History:  Procedure  Laterality Date   APPENDECTOMY     BIOPSY  11/11/2020   Procedure: BIOPSY;  Surgeon: [name], MD;  [facility]: [facility];  Service: Gastroenterology;;  duodenum gastric   BUNIONECTOMY     COLONOSCOPY WITH PROPOFOL N/A 11/11/2020   Procedure: COLONOSCOPY WITH PROPOFOL;  Surgeon: [name], MD;  Location: [facility];  Service: Gastroenterology;  Laterality: N/A;  11:15   ESOPHAGOGASTRODUODENOSCOPY (EGD) WITH PROPOFOL N/A 11/11/2020   Procedure: ESOPHAGOGASTRODUODENOSCOPY (EGD) WITH PROPOFOL;  Surgeon: [name], MD;  Location: [facility];  Service: Gastroenterology;  Laterality: N/A;  to arrive at 0845   MANDIBLE SURGERY     TONSILECTOMY, ADENOIDECTOMY, BILATERAL MYRINGOTOMY AND TUBES     Family History  Problem Relation Age of Onset   Arthritis Mother    Depression Mother    Diabetes Mother    Hypertension Mother    Alcohol abuse Father    Hypertension Father    Heart disease Father 47       Massive MI   Diabetes Brother    Diabetes Brother    Heart disease Brother    Diabetes Brother        Type I    Social History   Socioeconomic History   Marital status: Married    Spouse name: Casimiro Needle   Number of children: 4   Years of education: some college   Highest education level: Some college, no degree  Occupational History   Occupation: Retired  Tobacco Use   Smoking status: Never    Passive exposure: Never   Smokeless tobacco: Never  Vaping Use   Vaping Use: Never used  Substance and Sexual Activity   Alcohol use: Yes    Comment: socially on rare occasions   Drug use: No   Sexual activity: Yes    Birth control/protection: Other-see comments    Comment: postmenopausal  Other Topics Concern   Not on file  Social History Narrative   Not on file   Social Determinants of Health   Financial Resource Strain: Low Risk  (06/19/2020)   Overall Financial Resource Strain (CARDIA)    Difficulty of Paying Living Expenses:  Not very hard  Food Insecurity: No Food Insecurity (02/10/2021)   Hunger Vital Sign    Worried About Running Out of Food in the Last Year: Never true    Ran Out of Food in the Last Year: Never true  Transportation Needs: No Transportation Needs (02/10/2021)   PRAPARE - Administrator, Civil Service (Medical): No    Lack of Transportation (Non-Medical): No  Physical Activity: Insufficiently Active (02/10/2021)   Exercise Vital Sign    Days of Exercise per Week: 2 days    Minutes of Exercise per Session: 20 min  Stress: No Stress Concern Present (02/10/2021)   [employer]  of Occupational Health - Occupational Stress Questionnaire    Feeling of Stress : Not at all  Social Connections: Moderately Integrated (02/10/2021)   Social Connection and Isolation Panel [NHANES]    Frequency of Communication with Friends and Family: More than three times a week    Frequency of Social Gatherings with Friends and Family: Three times a week    Attends Religious Services: More than 4 times per year    Active Member of Clubs or Organizations: No    Attends Banker Meetings: Never    Marital Status: Married    Tobacco Counseling Counseling given: Not Answered   Clinical Intake:  Pre-visit preparation completed: Yes  Pain : 0-10 Pain Score: 6  Pain Type: Acute pain Pain Location: Hip Pain Orientation: Right Pain Descriptors / Indicators: Aching Pain Onset: More than a month ago Pain Frequency: Intermittent   How often do you need to have someone help you when you read instructions, pamphlets, or other written materials from your doctor or pharmacy?: 1 - Never What is the last grade level you completed in school?: some college  Diabetic?Yes   Activities of Daily Living    03/06/2023    8:08 AM  In your present state of health, do you have any difficulty performing the following activities:  Hearing? 0  Vision? 0  Difficulty concentrating or making decisions? 0   Walking or climbing stairs? 0  Dressing or bathing? 0  Doing errands, shopping? 0    Patient Care Team: Anabel Halon, MD as PCP - General (Internal Medicine) Laqueta Linden, MD (Inactive) as PCP - Cardiology (Cardiology) Erroll Luna, [facility] as Pharmacist (Pharmacist)  Indicate any recent Medical Services you may have received from other than [facility] providers in the past year (date may be approximate).     Assessment:   This is a routine wellness examination for [place].  Hearing/Vision screen No results found.  Dietary issues and exercise activities discussed:     Goals Addressed   None    Depression Screen    03/06/2023    8:08 AM 12/07/2022    1:30 PM 08/02/2022    2:37 PM 06/10/2022   11:12 AM 06/02/2022    2:23 PM 04/21/2022    1:52 PM 03/30/2022    1:06 PM  PHQ 2/9 Scores  PHQ - 2 Score 0 2 2  2  1   PHQ- 9 Score  6 13  6  7      Information is confidential and restricted. Go to Review Flowsheets to unlock data.    Fall Risk    03/06/2023    8:07 AM 12/07/2022    1:30 PM 08/02/2022    2:37 PM 06/02/2022    2:23 PM 03/30/2022    1:06 PM  Fall Risk   Falls in the past year? 0 0 0 0 0  Number falls in past yr:  0 0 0 0  Injury with Fall?  0 0 0 0  Risk for fall due to :   No Fall Risks No Fall Risks No Fall Risks  Follow up   Falls evaluation completed Falls evaluation completed Falls evaluation completed    FALL RISK PREVENTION PERTAINING TO THE HOME:  Any stairs in or around the home? Yes  If so, are there any without handrails? No  Home free of loose throw rugs in walkways, pet beds, electrical cords, etc? Yes  Adequate lighting in your home to reduce risk of falls? Yes  ASSISTIVE DEVICES UTILIZED TO PREVENT FALLS:  Life alert? No  Use of a cane, walker or w/c? No  Grab bars in the bathroom? Yes  Shower chair or bench in shower? No  Elevated toilet seat or a handicapped toilet? No     Cognitive Function:        03/06/2023    8:08 AM  02/14/2022    2:20 PM 02/10/2021    1:26 PM  6CIT Screen  What Year? 0 points 0 points 0 points  What month? 0 points 0 points 0 points  What time? 0 points 0 points 0 points  Count back from 20 0 points 0 points 0 points  Months in reverse 0 points 0 points 0 points  Repeat phrase 0 points 0 points 2 points  Total Score 0 points 0 points 2 points    Immunizations Immunization History  Administered Date(s) Administered   Fluad Quad(high Dose 65+) 09/15/2021, 08/02/2022   Moderna SARS-COV2 Booster Vaccination 11/14/2020   Moderna Sars-Covid-2 Vaccination 04/14/2020, 05/12/2020   PNEUMOCOCCAL CONJUGATE-20 09/27/2021   Zoster Recombinat (Shingrix) 07/01/2022, 09/14/2022    TDAP status: Due, Education has been provided regarding the importance of this vaccine. Advised may receive this vaccine at local pharmacy or Health Dept. Aware to provide a copy of the vaccination record if obtained from local pharmacy or Health Dept. Verbalized acceptance and understanding.  Flu Vaccine status: Up to date  Pneumococcal vaccine status: Up to date  Covid-19 vaccine status: Information provided on how to obtain vaccines.   Qualifies for Shingles Vaccine? Yes   Zostavax completed No   Shingrix Completed?: Yes  Screening Tests Health Maintenance  Topic Date Due   DTaP/Tdap/Td (1 - Tdap) Never done   COVID-19 Vaccine (6 - 2023-24 season) 07/01/2022   Medicare Annual Wellness (AWV)  02/15/2023   MAMMOGRAM  02/18/2023   Diabetic kidney evaluation - Urine ACR  03/31/2023   FOOT EXAM  03/31/2023   INFLUENZA VACCINE  06/01/2023   HEMOGLOBIN A1C  06/07/2023   Diabetic kidney evaluation - eGFR measurement  08/03/2023   OPHTHALMOLOGY EXAM  09/08/2023   COLONOSCOPY (Pts 45-11yrs Insurance coverage will need to be confirmed)  11/11/2030   Pneumonia Vaccine 65+ Years old  Completed   DEXA SCAN  Completed   Hepatitis C Screening  Completed   Zoster Vaccines- Shingrix  Completed   HPV VACCINES  Aged  Out    Health Maintenance  Health Maintenance Due  Topic Date Due   DTaP/Tdap/Td (1 - Tdap) Never done   COVID-19 Vaccine (6 - 2023-24 season) 07/01/2022   Medicare Annual Wellness (AWV)  02/15/2023   MAMMOGRAM  02/18/2023   Diabetic kidney evaluation - Urine ACR  03/31/2023    Colorectal cancer screening: Type of screening: Colonoscopy. Completed 11/11/2020. Repeat every 10 years  Mammogram status: Completed 02/17/2021. Patient is following up with GYN who always orders this study.   Bone Density status: Completed 11/09/2018. Results reflect: Bone density results: NORMAL.   Lung Cancer Screening: (Low Dose CT Chest recommended if Age 71-80 years, 30 pack-year currently smoking OR have quit w/in 15years.) does not qualify.     Additional Screening:  Hepatitis C Screening: does not qualify; Completed 11/28/2018  Vision Screening: Recommended annual ophthalmology exams for early detection of glaucoma and other disorders of the eye. Is the patient up to date with their annual eye exam?  Yes  Who is the provider or what is the name of the office in which the patient attends  annual eye exams? [facility]  If pt is not established with a provider, would they like to be referred to a provider to establish care? No .   Dental Screening: Recommended annual dental exams for proper oral hygiene  Community Resource Referral / Chronic Care Management: CRR required this visit?  No   CCM required this visit?  No      Plan:     I have personally reviewed and noted the following in the patient's chart:   Medical and social history Use of alcohol, tobacco or illicit drugs  Current medications and supplements including opioid prescriptions. Patient is not currently taking opioid prescriptions. Functional ability and status Nutritional status Physical activity Advanced directives List of other physicians Hospitalizations, surgeries, and ER visits in previous 12 months Vitals Screenings  to include cognitive, depression, and falls Referrals and appointments  In addition, I have reviewed and discussed with patient certain preventive protocols, quality metrics, and best practice recommendations. A written personalized care plan for preventive services as well as general preventive health recommendations were provided to patient.     Carolyn Banister, MD   03/06/2023

## 2023-03-09 ENCOUNTER — Encounter: Payer: Medicare Other | Attending: Internal Medicine | Admitting: Nutrition

## 2023-03-09 VITALS — Ht 63.75 in | Wt 187.0 lb

## 2023-03-09 DIAGNOSIS — E782 Mixed hyperlipidemia: Secondary | ICD-10-CM

## 2023-03-09 DIAGNOSIS — E118 Type 2 diabetes mellitus with unspecified complications: Secondary | ICD-10-CM | POA: Diagnosis not present

## 2023-03-09 DIAGNOSIS — Z713 Dietary counseling and surveillance: Secondary | ICD-10-CM | POA: Diagnosis not present

## 2023-03-09 DIAGNOSIS — E785 Hyperlipidemia, unspecified: Secondary | ICD-10-CM | POA: Insufficient documentation

## 2023-03-09 DIAGNOSIS — K760 Fatty (change of) liver, not elsewhere classified: Secondary | ICD-10-CM | POA: Insufficient documentation

## 2023-03-09 DIAGNOSIS — E669 Obesity, unspecified: Secondary | ICD-10-CM | POA: Diagnosis not present

## 2023-03-09 NOTE — Progress Notes (Signed)
Medical Nutrition Therapy  Appointment Start time:  0900  Appointment End time:  1000  Primary concerns today:  DM Type 2, Obesity and Hyperlipidemia, Fatty LIver  Referral diagnosis: E11.8, E66.9, E78.0, K76 Preferred learning sty no preference Learning readiness: ready    NUTRITION ASSESSMENT  71 yr odl wfemale referred for Type 2 DM. Complains of sweets being her downfall. Currently on fargixa. PCP Dr. Allena Katz  She Is willing to work with [facility] to improve her health, lower her BS and lose weight. Willing to participate in the 6 pillars of health to improve her health also.  Anthropometrics  Wt Readings from Last 3 Encounters:  03/09/23 187 lb (84.8 kg)  02/27/23 187 lb 14.4 oz (85.2 kg)  12/07/22 186 lb 12.8 oz (84.7 kg)   Ht Readings from Last 3 Encounters:  03/09/23 5' 3.75" (1.619 m)  02/27/23 5' 1.75" (1.568 m)  12/07/22 5\' 3"  (1.6 m)   Body mass index is 32.35 kg/m. @BMIFA @ Facility age limit for growth %iles is 20 years. Facility age limit for growth %iles is 20 years.    Clinical Medical Hx: . Past Medical History:  Diagnosis Date   Depression    Diabetes mellitus without complication (HCC)    Diastolic dysfunction    Hyperlipidemia    Myasthenia gravis (HCC)    Patient is Jehovah's Witness    Pulmonary embolism (HCC) 05/18/2021   Sleep apnea      Medications:  Current Outpatient Medications on File Prior to Visit  Medication Sig Dispense Refill   Cholecalciferol (VITAMIN D3) 125 MCG (5000 UT) TABS Take 5,000 Units by mouth daily.     Glycerin-Hypromellose-PEG 400 (DRY EYE RELIEF DROPS) 0.2-0.2-1 % SOLN Place 1 drop into both eyes daily as needed (Dry eye). homeopathic     Multiple Vitamins-Minerals (MULTI COMPLETE) CAPS Take 1 capsule by mouth daily.     pantoprazole (PROTONIX) 40 MG tablet Take 1 tablet (40 mg total) by mouth daily. 90 tablet 3   VASCEPA 1 g capsule Take 2 capsules (2 g total) by mouth 2 (two) times daily. (Patient taking  differently: Take 1 g by mouth 2 (two) times daily.) 360 capsule 3   Blood Glucose Monitoring Suppl (BLOOD GLUCOSE SYSTEM PAK) KIT Use as directed to monitor FSBS 2x daily. Dx: E11.9 1 each 1   Coenzyme Q10 (CO Q 10 PO) Take 1 capsule by mouth daily.     glucose blood (ONETOUCH VERIO) test strip USE TO TEST BLOOD SUGAR TWICE DAILY 100 strip 3   glucose blood (ONETOUCH VERIO) test strip USE TO TEST BLOOD SUGAR TWICE DAILY 100 strip 3   MAGNESIUM PO Take 2 tablets by mouth daily at 2 am.     No current facility-administered medications on file prior to visit.    Labs:  Lab Results  Component Value Date   HGBA1C 7.4 (H) 03/20/2023      Latest Ref Rng & Units 03/20/2023    9:24 AM 08/02/2022    3:37 PM 05/29/2022    6:46 AM  CMP  Glucose 70 - 99 mg/dL 409  811  914   BUN 8 - 23 mg/dL 15  13  18    Creatinine 0.44 - 1.00 mg/dL 7.82  9.56  2.13   Sodium 135 - 145 mmol/L 137  144  138   Potassium 3.5 - 5.1 mmol/L 4.1  4.1  3.4   Chloride 98 - 111 mmol/L 99  99  101   CO2 22 -  32 mmol/L 27  25  26    Calcium 8.9 - 10.3 mg/dL 9.5  16.1  9.6   Total Protein 6.5 - 8.1 g/dL 7.4  7.4    Total Bilirubin 0.3 - 1.2 mg/dL 1.3  0.8    Alkaline Phos 38 - 126 U/L 72  82    AST 15 - 41 U/L 25  21    ALT 0 - 44 U/L 33  25      Notable Signs/Symptoms:   Lifestyle & Dietary Hx Lives with her husband,  Estimated daily fluid intake: 30 oz Supplements:  Sleep: varies Stress / self-care:  Current average weekly physical activity: ADL  24-Hr Dietary Recall Eats 2 -3 meals per day   Estimated Energy Needs Calories: 1200 Carbohydrate: 135g Protein: 90g Fat: 33g   NUTRITION DIAGNOSIS  NB-1.1 Food and nutrition-related knowledge deficit As related to DIabetes and Obesity.  As evidenced by A1C 7.1% and BMI > 30.   NUTRITION INTERVENTION  Nutrition education (E-1) on the following topics:  Nutrition and Diabetes education provided on My Plate, CHO counting, meal planning, portion sizes,  timing of meals, avoiding snacks between meals unless having a low blood sugar, target ranges for A1C and blood sugars, signs/symptoms and treatment of hyper/hypoglycemia, monitoring blood sugars, taking medications as prescribed, benefits of exercising 30 minutes per day and prevention of complications of DM.  Lifestyle Medicine  - Whole Food, Plant Predominant Nutrition is highly recommended: Eat Plenty of vegetables, Mushrooms, fruits, Legumes, Whole Grains, Nuts, seeds in lieu of processed meats, processed snacks/pastries red meat, poultry, eggs.    -It is better to avoid simple carbohydrates including: Cakes, Sweet Desserts, Ice Cream, Soda (diet and regular), Sweet Tea, Candies, Chips, Cookies, Store Bought Juices, Alcohol in Excess of  1-2 drinks a day, Lemonade,  Artificial Sweeteners, Doughnuts, Coffee Creamers, "Sugar-free" Products, etc, etc.  This is not a complete list.....  Exercise: If you are able: 30 -60 minutes a day ,4 days a week, or 150 minutes a week.  The longer the better.  Combine stretch, strength, and aerobic activities.  If you were told in the past that you have high risk for cardiovascular diseases, you may seek evaluation by your heart doctor prior to initiating moderate to intense exercise programs.   Handouts Provided Include  Know Your Numbers Lifestyle Medicine  Learning Style & Readiness for Change Teaching method utilized: Visual & Auditory  Demonstrated degree of understanding via: Teach Back  Barriers to learning/adherence to lifestyle change: None  Goals Established by Pt Goals Eat 3 meals per day at times discussed Focus on eating more whole plant based foods from a garden. Cut out processed foods, sweets and junk food Drink only water Avoid snacks and don't eat after 7 pm. Get A1C to 6.5% or below Exercise 30 minutes a day   MONITORING & EVALUATION Dietary intake, weekly physical activity, and BS and weight in 1 month.  Next Steps  Patient  is to work on meal planning and eating more foods from a garden.Marland Kitchen

## 2023-03-20 ENCOUNTER — Encounter (HOSPITAL_COMMUNITY): Payer: Self-pay

## 2023-03-20 ENCOUNTER — Encounter (HOSPITAL_COMMUNITY)
Admission: RE | Admit: 2023-03-20 | Discharge: 2023-03-20 | Disposition: A | Discharge status: Home / Self Care | Payer: Medicare Other | Source: Ambulatory Visit | Attending: Gastroenterology | Admitting: Gastroenterology

## 2023-03-20 ENCOUNTER — Other Ambulatory Visit (HOSPITAL_COMMUNITY)
Admission: RE | Admit: 2023-03-20 | Discharge: 2023-03-20 | Disposition: A | Discharge status: Home / Self Care | Payer: Medicare Other | Source: Ambulatory Visit | Attending: Gastroenterology | Admitting: Gastroenterology

## 2023-03-20 ENCOUNTER — Other Ambulatory Visit (HOSPITAL_COMMUNITY)
Admission: RE | Admit: 2023-03-20 | Discharge: 2023-03-20 | Disposition: A | Discharge status: Home / Self Care | Payer: Medicare Other | Source: Ambulatory Visit | Attending: Internal Medicine | Admitting: Internal Medicine

## 2023-03-20 DIAGNOSIS — E782 Mixed hyperlipidemia: Secondary | ICD-10-CM | POA: Insufficient documentation

## 2023-03-20 DIAGNOSIS — E1169 Type 2 diabetes mellitus with other specified complication: Secondary | ICD-10-CM | POA: Diagnosis not present

## 2023-03-20 DIAGNOSIS — Z01812 Encounter for preprocedural laboratory examination: Secondary | ICD-10-CM | POA: Diagnosis not present

## 2023-03-20 DIAGNOSIS — K219 Gastro-esophageal reflux disease without esophagitis: Secondary | ICD-10-CM | POA: Diagnosis not present

## 2023-03-20 LAB — COMPREHENSIVE METABOLIC PANEL
ALT: 33 U/L (ref 0–44)
AST: 25 U/L (ref 15–41)
Albumin: 4.5 g/dL (ref 3.5–5.0)
Alkaline Phosphatase: 72 U/L (ref 38–126)
Anion gap: 11 (ref 5–15)
BUN: 15 mg/dL (ref 8–23)
CO2: 27 mmol/L (ref 22–32)
Calcium: 9.5 mg/dL (ref 8.9–10.3)
Chloride: 99 mmol/L (ref 98–111)
Creatinine, Ser: 0.89 mg/dL (ref 0.44–1.00)
GFR, Estimated: >60 mL/min (ref >60)
Glucose, Bld: 186 mg/dL — ABNORMAL HIGH (ref 70–99)
Potassium: 4.1 mmol/L (ref 3.5–5.1)
Sodium: 137 mmol/L (ref 135–145)
Total Bilirubin: 1.3 mg/dL — ABNORMAL HIGH (ref 0.3–1.2)
Total Protein: 7.4 g/dL (ref 6.5–8.1)

## 2023-03-20 LAB — HEMOGLOBIN A1C
Hgb A1c MFr Bld: 7.4 % — ABNORMAL HIGH (ref 4.8–5.6)
Mean Plasma Glucose: 165.68 mg/dL

## 2023-03-20 LAB — LIPID PANEL
Cholesterol: 164 mg/dL (ref 0–200)
HDL: 44 mg/dL (ref >40)
LDL Cholesterol: 66 mg/dL (ref 0–99)
Total CHOL/HDL Ratio: 3.7 RATIO
Triglycerides: 269 mg/dL — ABNORMAL HIGH (ref <150)
VLDL: 54 mg/dL — ABNORMAL HIGH (ref 0–40)

## 2023-03-23 ENCOUNTER — Encounter (HOSPITAL_COMMUNITY): Payer: Self-pay | Admitting: Gastroenterology

## 2023-03-23 ENCOUNTER — Other Ambulatory Visit: Payer: Self-pay

## 2023-03-23 ENCOUNTER — Ambulatory Visit (HOSPITAL_COMMUNITY): Payer: Medicare Other | Admitting: Anesthesiology

## 2023-03-23 ENCOUNTER — Ambulatory Visit (HOSPITAL_COMMUNITY)
Admission: RE | Admit: 2023-03-23 | Discharge: 2023-03-23 | Disposition: A | Discharge status: Home / Self Care | Payer: Medicare Other | Attending: Gastroenterology | Admitting: Gastroenterology

## 2023-03-23 ENCOUNTER — Telehealth (INDEPENDENT_AMBULATORY_CARE_PROVIDER_SITE_OTHER): Payer: Self-pay | Admitting: Gastroenterology

## 2023-03-23 ENCOUNTER — Encounter (HOSPITAL_COMMUNITY)
Admission: RE | Disposition: A | Discharge status: Home / Self Care | Payer: Self-pay | Source: Home / Self Care | Attending: Gastroenterology

## 2023-03-23 ENCOUNTER — Ambulatory Visit (HOSPITAL_BASED_OUTPATIENT_CLINIC_OR_DEPARTMENT_OTHER): Payer: Medicare Other | Admitting: Anesthesiology

## 2023-03-23 DIAGNOSIS — K589 Irritable bowel syndrome without diarrhea: Secondary | ICD-10-CM | POA: Insufficient documentation

## 2023-03-23 DIAGNOSIS — K224 Dyskinesia of esophagus: Secondary | ICD-10-CM

## 2023-03-23 DIAGNOSIS — Z79899 Other long term (current) drug therapy: Secondary | ICD-10-CM | POA: Insufficient documentation

## 2023-03-23 DIAGNOSIS — K449 Diaphragmatic hernia without obstruction or gangrene: Secondary | ICD-10-CM

## 2023-03-23 DIAGNOSIS — Z7984 Long term (current) use of oral hypoglycemic drugs: Secondary | ICD-10-CM | POA: Insufficient documentation

## 2023-03-23 DIAGNOSIS — R1319 Other dysphagia: Secondary | ICD-10-CM

## 2023-03-23 DIAGNOSIS — R1310 Dysphagia, unspecified: Secondary | ICD-10-CM

## 2023-03-23 DIAGNOSIS — E785 Hyperlipidemia, unspecified: Secondary | ICD-10-CM | POA: Diagnosis not present

## 2023-03-23 DIAGNOSIS — F32A Depression, unspecified: Secondary | ICD-10-CM | POA: Diagnosis not present

## 2023-03-23 DIAGNOSIS — G7000 Myasthenia gravis without (acute) exacerbation: Secondary | ICD-10-CM | POA: Insufficient documentation

## 2023-03-23 DIAGNOSIS — K219 Gastro-esophageal reflux disease without esophagitis: Secondary | ICD-10-CM | POA: Insufficient documentation

## 2023-03-23 DIAGNOSIS — I119 Hypertensive heart disease without heart failure: Secondary | ICD-10-CM

## 2023-03-23 DIAGNOSIS — F419 Anxiety disorder, unspecified: Secondary | ICD-10-CM | POA: Insufficient documentation

## 2023-03-23 DIAGNOSIS — G4730 Sleep apnea, unspecified: Secondary | ICD-10-CM | POA: Diagnosis not present

## 2023-03-23 DIAGNOSIS — E119 Type 2 diabetes mellitus without complications: Secondary | ICD-10-CM

## 2023-03-23 DIAGNOSIS — I10 Essential (primary) hypertension: Secondary | ICD-10-CM | POA: Diagnosis not present

## 2023-03-23 HISTORY — PX: ESOPHAGEAL DILATION: SHX303

## 2023-03-23 HISTORY — PX: ESOPHAGOGASTRODUODENOSCOPY (EGD) WITH PROPOFOL: SHX5813

## 2023-03-23 LAB — GLUCOSE, CAPILLARY: Glucose-Capillary: 178 mg/dL — ABNORMAL HIGH (ref 70–99)

## 2023-03-23 SURGERY — ESOPHAGOGASTRODUODENOSCOPY (EGD) WITH PROPOFOL
Anesthesia: General

## 2023-03-23 MED ORDER — PROPOFOL 10 MG/ML IV BOLUS
INTRAVENOUS | Status: DC | PRN
  Administered 2023-03-23: 100 mg via INTRAVENOUS
  Administered 2023-03-23: 50 mg via INTRAVENOUS

## 2023-03-23 MED ORDER — LACTATED RINGERS IV SOLN: INTRAVENOUS | Status: DC

## 2023-03-23 MED ORDER — LIDOCAINE HCL (CARDIAC) PF 100 MG/5ML IV SOSY
PREFILLED_SYRINGE | INTRAVENOUS | Status: DC | PRN
  Administered 2023-03-23: 50 mg via INTRAVENOUS

## 2023-03-23 MED ORDER — PROPOFOL 500 MG/50ML IV EMUL
INTRAVENOUS | Status: DC | PRN
  Administered 2023-03-23: 150 ug/kg/min via INTRAVENOUS

## 2023-03-23 NOTE — Discharge Instructions (Signed)
You are being discharged to home.  Resume your previous diet.  Continue present medications.  Schedule esophageal manometry and pH impedance testing off PPI - referral to [facility]. Schedule barium esophagram.

## 2023-03-23 NOTE — Transfer of Care (Signed)
Immediate Anesthesia Transfer of Care Note  Patient: Carolyn [name]  Procedure(s) Performed: ESOPHAGOGASTRODUODENOSCOPY (EGD) WITH PROPOFOL  Patient Location: Short Stay  Anesthesia Type:General  Level of Consciousness: drowsy  Airway & Oxygen Therapy: Patient Spontanous Breathing and Patient connected to nasal cannula oxygen  Post-op Assessment: Report given to RN and Post -op Vital signs reviewed and stable  Post vital signs: Reviewed and stable  Last Vitals:  Vitals Value Taken Time  BP    Temp    Pulse    Resp    SpO2      Last Pain:  Vitals:   03/23/23 0657  TempSrc: Oral  PainSc: 3       Patients Stated Pain Goal: 6 (03/23/23 0657)  Complications: No notable events documented.

## 2023-03-23 NOTE — Addendum Note (Signed)
Addended by: [name] on: 03/23/2023 09:39 AM   Modules accepted: Orders

## 2023-03-23 NOTE — Interval H&P Note (Signed)
History and Physical Interval Note:  03/23/2023 7:36 AM  Carolyn [name]  has presented today for surgery, with the diagnosis of GERD.  The various methods of treatment have been discussed with the patient and family. After consideration of risks, benefits and other options for treatment, the patient has consented to  Procedure(s) with comments: ESOPHAGOGASTRODUODENOSCOPY (EGD) WITH PROPOFOL (N/A) - 7:30am;asa 2 as a surgical intervention.  The patient's history has been reviewed, patient examined, no change in status, stable for surgery.  I have reviewed the patient's chart and labs.  Questions were answered to the patient's satisfaction.     [name]

## 2023-03-23 NOTE — Op Note (Signed)
[facility] Patient Name: Carolyn [name] Procedure Date: 03/23/2023 7:00 AM MRN: [id number] Date of Birth: 04-13-1951 Attending MD: [name] , , [id number] CSN: [id number] Age: 72 Admit Type: Outpatient Procedure:                Upper GI endoscopy Indications:              Dysphagia, Gastro-esophageal reflux disease Providers:                [name], Buel Ream. Thomasena Edis RN, RN, Dyann Ruddle Referring MD:              Medicines:                Monitored Anesthesia Care Complications:            No immediate complications. Estimated Blood Loss:     Estimated blood loss: none. Procedure:                Pre-Anesthesia Assessment:                           - Prior to the procedure, a History and Physical                            was performed, and patient medications, allergies                            and sensitivities were reviewed. The patient's                            tolerance of previous anesthesia was reviewed.                           - The risks and benefits of the procedure and the                            sedation options and risks were discussed with the                            patient. All questions were answered and informed                            consent was obtained.                           - ASA Grade Assessment: II - A patient with mild                            systemic disease.                           After obtaining informed consent, the endoscope was                            passed under direct vision. Throughout the  procedure, the patient's blood pressure, pulse, and                            oxygen saturations were monitored continuously. The                            GIF-H190 ([id number]) scope was introduced through the                            mouth, and advanced to the second part of duodenum.                            The upper GI endoscopy was accomplished  without                            difficulty. The patient tolerated the procedure                            well. Scope In: 7:42:06 AM Scope Out: 7:49:24 AM Total Procedure Duration: 0 hours 7 minutes 18 seconds  Findings:      Abnormal motility was noted in the distal esophagus. The distal       esophagus/lower esophageal sphincter is spastic, but gives up passage to       the endoscope. A TTS dilator was passed through the scope. Dilation with       a 15-16.5-18 mm balloon dilator was performed to 18 mm. The dilation       site was examined and showed no change.      A 1 cm hiatal hernia was present.      The gastroesophageal flap valve was visualized endoscopically and       classified as Hill Grade II (fold present, opens with respiration).      The stomach was normal.      The examined duodenum was normal. Impression:               - Abnormal esophageal motility, suspicious for                            esophageal spasm. Dilated.                           - Normal stomach.                           - Normal examined duodenum.                           - 1 cm hiatal hernia.                           - No specimens collected. Moderate Sedation:      Per Anesthesia Care Recommendation:           - Discharge patient to home (ambulatory).                           - Resume previous diet.                           -  Continue present medications.                           - Schedule esophageal manometry and pH impedance                            testing off PPI - referral to [facility].                           - Schedule barium esophagram. Procedure Code(s):        --- Professional ---                           [phone], Esophagogastroduodenoscopy, flexible,                            transoral; with transendoscopic balloon dilation of                            esophagus (less than 30 mm diameter) Diagnosis Code(s):        --- Professional ---                            K22.4, Dyskinesia of esophagus                           K44.9, Diaphragmatic hernia without obstruction or                            gangrene                           R13.10, Dysphagia, unspecified                           K21.9, Gastro-esophageal reflux disease without                            esophagitis CPT copyright 2022 [employer]. All rights reserved. The codes documented in this report are preliminary and upon coder review may  be revised to meet current compliance requirements. [name], MD [name],  03/23/2023 7:59:17 AM This report has been signed electronically. Number of Addenda: 0

## 2023-03-23 NOTE — Anesthesia Postprocedure Evaluation (Signed)
Anesthesia Post Note  Patient: Carolyn [name]  Procedure(s) Performed: ESOPHAGOGASTRODUODENOSCOPY (EGD) WITH PROPOFOL ESOPHAGEAL DILATION  Patient location during evaluation: Phase II Anesthesia Type: General Level of consciousness: awake and alert and oriented Pain management: pain level controlled Vital Signs Assessment: post-procedure vital signs reviewed and stable Respiratory status: spontaneous breathing, nonlabored ventilation and respiratory function stable Cardiovascular status: blood pressure returned to baseline and stable Postop Assessment: no apparent nausea or vomiting Anesthetic complications: no  No notable events documented.   Last Vitals:  Vitals:   03/23/23 0657 03/23/23 0753  BP: 128/76 (!) 102/53  Pulse: 68 69  Resp: 15 15  Temp: 36.8 C 36.7 C  SpO2: 98% 96%    Last Pain:  Vitals:   03/23/23 0753  TempSrc: Axillary  PainSc: 0-No pain                 Eiman Maret C Faylene Allerton

## 2023-03-23 NOTE — Anesthesia Procedure Notes (Signed)
Date/Time: 03/23/2023 7:44 AM  Performed by: Julian Reil, CRNAPre-anesthesia Checklist: Patient identified, Emergency Drugs available, Suction available and Patient being monitored Patient Re-evaluated:Patient Re-evaluated prior to induction Oxygen Delivery Method: Nasal cannula Induction Type: IV induction Placement Confirmation: positive ETCO2

## 2023-03-23 NOTE — Telephone Encounter (Signed)
[name], MD  [name], LPN; [name], can you please refer the patient to [facility] for esophageal manometry and pH impedance testing off PPI  [name], can you please schedule a barium esophagram? Dx: dysphagia.  Thanks,  [name], MD Gastroenterology and Hepatology [facility]

## 2023-03-23 NOTE — Telephone Encounter (Signed)
Barium esophagram scheduled for 03/30/23 Carolyn [name] at 9:00am; pt is to arrive by 8:45am.  Pt contacted and made aware.

## 2023-03-23 NOTE — Anesthesia Preprocedure Evaluation (Addendum)
Anesthesia Evaluation  Patient identified by MRN, date of birth, ID band Patient awake    Reviewed: Allergy & Precautions, H&P , NPO status , Patient's Chart, lab work & pertinent test results, reviewed documented beta blocker date and time   Airway Mallampati: II  TM Distance: >3 FB Neck ROM: Full    Dental  (+) Dental Advisory Given   Pulmonary sleep apnea , PE   Pulmonary exam normal breath sounds clear to auscultation       Cardiovascular Exercise Tolerance: Good hypertension, Pt. on medications and Pt. on home beta blockers Normal cardiovascular exam Rhythm:Regular Rate:Normal  1. The left ventricle has low normal systolic function, with an ejection  fraction of 50-55%. The cavity size was normal. There is mild concentric  left ventricular hypertrophy. Left ventricular diastolic Doppler  parameters are consistent with impaired  relaxation No evidence of left ventricular regional wall motion  abnormalities.   2. The mitral valve is normal in structure.   3. The tricuspid valve is normal in structure.   4. The aortic valve is tricuspid.   5. No evidence of left ventricular regional wall motion abnormalities.     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  Reproductive/Obstetrics negative OB ROS                             Anesthesia Physical Anesthesia Plan  ASA: 3  Anesthesia Plan: General   Post-op Pain Management: Minimal or  no pain anticipated   Induction: Intravenous  PONV Risk Score and Plan: 0 and Propofol infusion  Airway Management Planned: Nasal Cannula and Natural Airway  Additional Equipment:   Intra-op Plan:   Post-operative Plan:   Informed Consent: I have reviewed the patients History and Physical, chart, labs and discussed the procedure including the risks, benefits and alternatives for the proposed anesthesia with the patient or authorized representative who has indicated his/her understanding and acceptance.     Dental advisory given  Plan Discussed with: CRNA and Surgeon  Anesthesia Plan Comments:         Anesthesia Quick Evaluation

## 2023-03-30 ENCOUNTER — Encounter (HOSPITAL_COMMUNITY): Payer: Self-pay | Admitting: Gastroenterology

## 2023-03-30 ENCOUNTER — Ambulatory Visit (HOSPITAL_COMMUNITY)
Admission: RE | Admit: 2023-03-30 | Discharge: 2023-03-30 | Disposition: A | Discharge status: Home / Self Care | Payer: Medicare Other | Source: Ambulatory Visit | Attending: Gastroenterology | Admitting: Gastroenterology

## 2023-03-30 ENCOUNTER — Ambulatory Visit (HOSPITAL_COMMUNITY): Payer: Medicare Other | Admitting: Psychiatry

## 2023-03-30 DIAGNOSIS — K224 Dyskinesia of esophagus: Secondary | ICD-10-CM | POA: Diagnosis not present

## 2023-03-30 DIAGNOSIS — R1310 Dysphagia, unspecified: Secondary | ICD-10-CM | POA: Insufficient documentation

## 2023-03-30 DIAGNOSIS — K449 Diaphragmatic hernia without obstruction or gangrene: Secondary | ICD-10-CM | POA: Diagnosis not present

## 2023-04-04 ENCOUNTER — Telehealth (HOSPITAL_COMMUNITY): Payer: Self-pay | Admitting: Psychiatry

## 2023-04-04 MED ORDER — BUPROPION HCL ER (SR) 200 MG PO TB12: 200.0000 mg | ORAL_TABLET | Freq: Every day | ORAL | 0 refills | Status: DC

## 2023-04-04 NOTE — Telephone Encounter (Signed)
Patient called to reschedule 04/06/2023 appointment and also requested refill of:  buPROPion (WELLBUTRIN SR) 200 MG 12 hr tablet.  [facility] [phone] - [place], [place] - [address] AT [facility] OF [address] (Ph: [phone])   Last ordered: 12/29/2022 - 30 tablets with 1 refill  Last visit: 12/29/2022  Next visit: 04/27/2023

## 2023-04-05 ENCOUNTER — Telehealth (HOSPITAL_COMMUNITY): Payer: Self-pay | Admitting: *Deleted

## 2023-04-05 MED ORDER — BUPROPION HCL ER (SR) 200 MG PO TB12: 200.0000 mg | ORAL_TABLET | Freq: Every day | ORAL | 0 refills | Status: DC

## 2023-04-05 NOTE — Telephone Encounter (Signed)
buPROPion (WELLBUTRIN SR) 200 MG 12 hr tablet [[id number]]   Order Details Dose: 200 mg Route: Oral Frequency: Daily  Dispense Quantity: 30 tablet Refills: 0   Note to Pharmacy: Brand name SR medically necessary  Indications of Use: Major Depressive Disorder       Sig: Take 1 tablet (200 mg total) by mouth daily.   [facility] [phone] - [place], [place] - [address] AT [facility] OF [address]   LAST APPT -- 12/29/22 NEXT APPT -- 04/27/23

## 2023-04-05 NOTE — Addendum Note (Signed)
Addended by: Thresa Ross on: 04/05/2023 10:42 AM   Modules accepted: Orders

## 2023-04-06 ENCOUNTER — Ambulatory Visit (HOSPITAL_COMMUNITY): Payer: Medicare Other | Admitting: Psychiatry

## 2023-04-07 ENCOUNTER — Other Ambulatory Visit: Payer: Self-pay | Admitting: Internal Medicine

## 2023-04-12 ENCOUNTER — Encounter: Payer: Self-pay | Admitting: Internal Medicine

## 2023-04-12 ENCOUNTER — Ambulatory Visit (INDEPENDENT_AMBULATORY_CARE_PROVIDER_SITE_OTHER): Payer: Medicare Other | Admitting: Internal Medicine

## 2023-04-12 VITALS — BP 118/77 | HR 80 | Ht 62.5 in | Wt 184.2 lb

## 2023-04-12 DIAGNOSIS — M25551 Pain in right hip: Secondary | ICD-10-CM

## 2023-04-12 DIAGNOSIS — I10 Essential (primary) hypertension: Secondary | ICD-10-CM | POA: Diagnosis not present

## 2023-04-12 DIAGNOSIS — E1169 Type 2 diabetes mellitus with other specified complication: Secondary | ICD-10-CM

## 2023-04-12 DIAGNOSIS — M25552 Pain in left hip: Secondary | ICD-10-CM | POA: Diagnosis not present

## 2023-04-12 DIAGNOSIS — M5136 Other intervertebral disc degeneration, lumbar region: Secondary | ICD-10-CM | POA: Diagnosis not present

## 2023-04-12 DIAGNOSIS — R1011 Right upper quadrant pain: Secondary | ICD-10-CM | POA: Diagnosis not present

## 2023-04-12 DIAGNOSIS — M51369 Other intervertebral disc degeneration, lumbar region without mention of lumbar back pain or lower extremity pain: Secondary | ICD-10-CM

## 2023-04-12 DIAGNOSIS — Z780 Asymptomatic menopausal state: Secondary | ICD-10-CM

## 2023-04-12 DIAGNOSIS — F339 Major depressive disorder, recurrent, unspecified: Secondary | ICD-10-CM

## 2023-04-12 DIAGNOSIS — E782 Mixed hyperlipidemia: Secondary | ICD-10-CM | POA: Diagnosis not present

## 2023-04-12 MED ORDER — ROSUVASTATIN CALCIUM 10 MG PO TABS: 10.0000 mg | ORAL_TABLET | Freq: Every day | ORAL | 1 refills | Status: DC

## 2023-04-12 MED ORDER — LOSARTAN POTASSIUM-HCTZ 100-12.5 MG PO TABS: 1.0000 | ORAL_TABLET | Freq: Every day | ORAL | 1 refills | Status: DC

## 2023-04-12 NOTE — Assessment & Plan Note (Addendum)
Lab Results  Component Value Date   HGBA1C 7.4 (H) 03/20/2023   Associated with HLD and HTN Uncontrolled, but she is trying to improve her diet -followed by nutritionist On Farxiga, did not tolerate Metformin Advised to follow diabetic diet On ARB and statin

## 2023-04-12 NOTE — Assessment & Plan Note (Signed)
BP Readings from Last 1 Encounters:  04/12/23 118/77   Usually well-controlled with  Losartan-HCTZ 100-12.5 mg QD and Metoprolol 25 mg QD Counseled for compliance with the medications Advised DASH diet and moderate exercise/walking, at least 150 mins/week

## 2023-04-12 NOTE — Progress Notes (Addendum)
Established Patient Office Visit  Subjective:  Patient ID: Carolyn [name], female    DOB: 1951-04-15  Age: 72 y.o. MRN: [id number]  CC:  Chief Complaint  Patient presents with   Diabetes    Four month follow up for diabetes and hypertension. Patient is concerned that her galbladder is going bad due to recent pain in her stomach and radiates to her back. Patient can feel musculare tension in her hips that move down her legs.     HPI Carolyn [name] is a 72 y.o. female with past medical history of  HTN, PE, OSA, IBS, DM, HLD, depression and obesity who presents for f/u of her chronic medical conditions.  HTN: BP is wnl. She takes Losartan-HCTZ regularly. Patient denies headache, dizziness, chest pain, dyspnea or palpitations.  Type II DM: She takes [place] for it.  Denies any polyuria or polyphagia currently.  She has had nutritionist counseling and has been trying to improve her diet.  She has been taking statin and Vascepa for HLD.  Depression: She complains of anhedonia, which has been stable recently.  She has fatigue, insomnia, agitation, lack of interest in activities and decreased concentration.  She also reports crying spells at times. She denies any SI or HI currently.  She is currently taking Wellbutrin 200 mg BID.  She is followed by psychiatry currently.  She has been on Zoloft, Lexapro and Prozac in the past, which were ineffective.  She reports intermittent episodes of right upper quadrant and epigastric abdominal pain, which radiates towards the back.  Her pain is worse after eating at times.  Denies any recent episode of nausea or vomiting.  She takes pantoprazole for GERD.  She also complains of lower back pain and bilateral hip area pain, which radiates towards upper thigh area.  Pain is constant, worse with bending and slightly better with Tylenol.  Denies any numbness or tingling of the LE currently.  Past Medical History:  Diagnosis Date   Depression    Diabetes  mellitus without complication (HCC)    Diastolic dysfunction    Hyperlipidemia    Myasthenia gravis (HCC)    Patient is Jehovah's Witness    Pulmonary embolism (HCC) 05/18/2021   Sleep apnea     Past Surgical History:  Procedure Laterality Date   APPENDECTOMY     BIOPSY  11/11/2020   Procedure: BIOPSY;  Surgeon: [name], MD;  [facility]: [facility];  Service: Gastroenterology;;  duodenum gastric   BUNIONECTOMY     COLONOSCOPY WITH PROPOFOL N/A 11/11/2020   Procedure: COLONOSCOPY WITH PROPOFOL;  Surgeon: [name], MD;  Location: [facility];  Service: Gastroenterology;  Laterality: N/A;  11:15   ESOPHAGEAL DILATION  03/23/2023   Procedure: ESOPHAGEAL DILATION;  Surgeon: Marguerita Merles, Reuel Boom, MD;  Location: [facility];  Service: Gastroenterology;;   ESOPHAGOGASTRODUODENOSCOPY (EGD) WITH PROPOFOL N/A 11/11/2020   Procedure: ESOPHAGOGASTRODUODENOSCOPY (EGD) WITH PROPOFOL;  Surgeon: [name], MD;  [facility]: [facility];  Service: Gastroenterology;  Laterality: N/A;  to arrive at 0845   ESOPHAGOGASTRODUODENOSCOPY (EGD) WITH PROPOFOL N/A 03/23/2023   Procedure: ESOPHAGOGASTRODUODENOSCOPY (EGD) WITH PROPOFOL;  Surgeon: [name], MD;  Location: [facility];  Service: Gastroenterology;  Laterality: N/A;  7:30am;asa 2   MANDIBLE SURGERY     TONSILECTOMY, ADENOIDECTOMY, BILATERAL MYRINGOTOMY AND TUBES      Family History  Problem Relation Age of Onset   Arthritis Mother    Depression Mother    Diabetes Mother  Hypertension Mother    Alcohol abuse Father    Hypertension Father    Heart disease Father 23       Massive MI   Diabetes Brother    Diabetes Brother    Heart disease Brother    Diabetes Brother        Type I     Social History   Socioeconomic History   Marital status: Married    Spouse name: Casimiro Needle   Number of children: 4   Years of education: some college   Highest education level: Some  college, no degree  Occupational History   Occupation: Retired  Tobacco Use   Smoking status: Never    Passive exposure: Never   Smokeless tobacco: Never  Vaping Use   Vaping Use: Never used  Substance and Sexual Activity   Alcohol use: Yes    Comment: socially on rare occasions   Drug use: No   Sexual activity: Yes    Birth control/protection: Other-see comments    Comment: postmenopausal  Other Topics Concern   Not on file  Social History Narrative   Not on file   Social Determinants of Health   Financial Resource Strain: Low Risk  (06/19/2020)   Overall Financial Resource Strain (CARDIA)    Difficulty of Paying Living Expenses: Not very hard  Food Insecurity: No Food Insecurity (02/10/2021)   Hunger Vital Sign    Worried About Running Out of Food in the Last Year: Never true    Ran Out of Food in the Last Year: Never true  Transportation Needs: No Transportation Needs (02/10/2021)   PRAPARE - Administrator, Civil Service (Medical): No    Lack of Transportation (Non-Medical): No  Physical Activity: Insufficiently Active (02/10/2021)   Exercise Vital Sign    Days of Exercise per Week: 2 days    Minutes of Exercise per Session: 20 min  Stress: No Stress Concern Present (02/10/2021)   [employer] of Occupational Health - Occupational Stress Questionnaire    Feeling of Stress : Not at all  Social Connections: Moderately Integrated (02/10/2021)   Social Connection and Isolation Panel [NHANES]    Frequency of Communication with Friends and Family: More than three times a week    Frequency of Social Gatherings with Friends and Family: Three times a week    Attends Religious Services: More than 4 times per year    Active Member of Clubs or Organizations: No    Attends Banker Meetings: Never    Marital Status: Married  Catering manager Violence: Not At Risk (02/10/2021)   Humiliation, Afraid, Rape, and Kick questionnaire    Fear of Current or  Ex-Partner: No    Emotionally Abused: No    Physically Abused: No    Sexually Abused: No    Outpatient Medications Prior to Visit  Medication Sig Dispense Refill   Blood Glucose Monitoring Suppl (BLOOD GLUCOSE SYSTEM PAK) KIT Use as directed to monitor FSBS 2x daily. Dx: E11.9 1 each 1   buPROPion (WELLBUTRIN SR) 200 MG 12 hr tablet Take 1 tablet (200 mg total) by mouth daily. 30 tablet 0   Cholecalciferol (VITAMIN D3) 125 MCG (5000 UT) TABS Take 5,000 Units by mouth daily.     Coenzyme Q10 (CO Q 10 PO) Take 1 capsule by mouth daily.     FARXIGA 10 MG TABS tablet TAKE 1 TABLET(10 MG) BY MOUTH DAILY BEFORE BREAKFAST 90 tablet 2   glucose blood (ONETOUCH VERIO) test strip  USE TO TEST BLOOD SUGAR TWICE DAILY 100 strip 3   glucose blood (ONETOUCH VERIO) test strip USE TO TEST BLOOD SUGAR TWICE DAILY 100 strip 3   Glycerin-Hypromellose-PEG 400 (DRY EYE RELIEF DROPS) 0.2-0.2-1 % SOLN Place 1 drop into both eyes daily as needed (Dry eye). homeopathic     hyoscyamine (LEVSIN SL) 0.125 MG SL tablet Place 1 tablet (0.125 mg total) under the tongue every 6 (six) hours as needed (spasms). 30 tablet 1   MAGNESIUM PO Take 2 tablets by mouth daily at 2 am.     metoprolol succinate (TOPROL-XL) 25 MG 24 hr tablet Take 1 tablet (25 mg total) by mouth daily. 90 tablet 1   Multiple Vitamins-Minerals (MULTI COMPLETE) CAPS Take 1 capsule by mouth daily.     pantoprazole (PROTONIX) 40 MG tablet Take 1 tablet (40 mg total) by mouth daily. 90 tablet 3   VASCEPA 1 g capsule Take 2 capsules (2 g total) by mouth 2 (two) times daily. (Patient taking differently: Take 1 g by mouth 2 (two) times daily.) 360 capsule 3   losartan-hydrochlorothiazide (HYZAAR) 100-12.5 MG tablet Take 1 tablet by mouth daily. 90 tablet 1   rosuvastatin (CRESTOR) 10 MG tablet TAKE 1 TABLET(10 MG) BY MOUTH DAILY 90 tablet 1   No facility-administered medications prior to visit.    Allergies  Allergen Reactions   Prozac [Fluoxetine Hcl]  Anaphylaxis   Codeine Other (See Comments)    Intolerance    Erythromycin Other (See Comments)   Other     -mycin medications: has myasthenia gravis    Sulfa Antibiotics Hives    ROS Review of Systems  Constitutional:  Negative for chills and fever.  HENT:  Negative for congestion, sinus pressure, sinus pain and sore throat.   Eyes:  Negative for pain and discharge.  Respiratory:  Negative for cough and shortness of breath.   Cardiovascular:  Negative for chest pain and palpitations.  Gastrointestinal:  Positive for abdominal pain. Negative for diarrhea, nausea and vomiting.  Endocrine: Negative for polydipsia and polyuria.  Genitourinary:  Negative for dysuria and hematuria.  Musculoskeletal:  Negative for neck pain and neck stiffness.  Skin:  Negative for rash.  Neurological:  Negative for dizziness and weakness.  Psychiatric/Behavioral:  Positive for dysphoric mood and sleep disturbance. Negative for agitation and behavioral problems. The patient is nervous/anxious.       Objective:    Physical Exam Vitals reviewed.  Constitutional:      General: She is not in acute distress.    Appearance: She is not diaphoretic.  HENT:     Head: Normocephalic and atraumatic.     Nose: Nose normal.     Mouth/Throat:     Mouth: Mucous membranes are moist.  Eyes:     General: No scleral icterus.    Extraocular Movements: Extraocular movements intact.  Cardiovascular:     Rate and Rhythm: Normal rate and regular rhythm.     Pulses: Normal pulses.     Heart sounds: Normal heart sounds. No murmur heard. Pulmonary:     Breath sounds: Normal breath sounds. No wheezing or rales.  Abdominal:     Palpations: Abdomen is soft.     Tenderness: There is abdominal tenderness (Mild, RUQ and epigastric).  Musculoskeletal:     Cervical back: Neck supple. No tenderness.     Lumbar back: Tenderness present. Decreased range of motion.     Right lower leg: No edema.     Left lower leg: No  edema.   Skin:    General: Skin is warm.     Findings: No rash.  Neurological:     General: No focal deficit present.     Mental Status: She is alert and oriented to person, place, and time.     Sensory: No sensory deficit.     Motor: No weakness.  Psychiatric:        Mood and Affect: Mood is depressed.        Behavior: Behavior is slowed.     BP 118/77 (BP Location: Right Arm, Patient Position: Sitting, Cuff Size: Normal)   Pulse 80   Ht 5' 2.5" (1.588 m)   Wt 184 lb 3.2 oz (83.6 kg)   SpO2 95%   BMI 33.15 kg/m  Wt Readings from Last 3 Encounters:  04/12/23 184 lb 3.2 oz (83.6 kg)  03/09/23 187 lb (84.8 kg)  02/27/23 187 lb 14.4 oz (85.2 kg)    Lab Results  Component Value Date   TSH 2.570 08/02/2022   Lab Results  Component Value Date   WBC 6.8 08/02/2022   HGB 16.6 (H) 08/02/2022   HCT 48.6 (H) 08/02/2022   MCV 94 08/02/2022   PLT 244 08/02/2022   Lab Results  Component Value Date   NA 137 03/20/2023   K 4.1 03/20/2023   CO2 27 03/20/2023   GLUCOSE 186 (H) 03/20/2023   BUN 15 03/20/2023   CREATININE 0.89 03/20/2023   BILITOT 1.3 (H) 03/20/2023   ALKPHOS 72 03/20/2023   AST 25 03/20/2023   ALT 33 03/20/2023   PROT 7.4 03/20/2023   ALBUMIN 4.5 03/20/2023   CALCIUM 9.5 03/20/2023   ANIONGAP 11 03/20/2023   EGFR 77 08/02/2022   Lab Results  Component Value Date   CHOL 164 03/20/2023   Lab Results  Component Value Date   HDL 44 03/20/2023   Lab Results  Component Value Date   LDLCALC 66 03/20/2023   Lab Results  Component Value Date   TRIG 269 (H) 03/20/2023   Lab Results  Component Value Date   CHOLHDL 3.7 03/20/2023   Lab Results  Component Value Date   HGBA1C 7.4 (H) 03/20/2023      Assessment & Plan:   Problem List Items Addressed This Visit       Cardiovascular and Mediastinum   Hypertension - Primary    BP Readings from Last 1 Encounters:  04/12/23 118/77  Usually well-controlled with  Losartan-HCTZ 100-12.5 mg QD and Metoprolol  25 mg QD Counseled for compliance with the medications Advised DASH diet and moderate exercise/walking, at least 150 mins/week      Relevant Medications   rosuvastatin (CRESTOR) 10 MG tablet   losartan-hydrochlorothiazide (HYZAAR) 100-12.5 MG tablet     Endocrine   Diabetes mellitus, type II (HCC)    Lab Results  Component Value Date   HGBA1C 7.4 (H) 03/20/2023  Associated with HLD and HTN Uncontrolled, but she is trying to improve her diet -followed by nutritionist On Farxiga, did not tolerate Metformin Advised to follow diabetic diet On ARB and statin      Relevant Medications   rosuvastatin (CRESTOR) 10 MG tablet   losartan-hydrochlorothiazide (HYZAAR) 100-12.5 MG tablet   Other Relevant Orders   Urine Microalbumin w/creat. ratio (Completed)     Musculoskeletal and Integument   DDD (degenerative disc disease), lumbar    Has chronic low back pain -likely due to DDD of lumbar spine Check x-ray of lumbar spine Avoid heavy lifting and frequent  Bendi Tylenol arthritis as needed for pain Avoid oral NSAIDs due to GERD      Relevant Orders   DG Lumbar Spine Complete     Other   Depression, recurrent (HCC)    Flowsheet Row Office Visit from 12/07/2022 in [facility]  PHQ-9 Total Score 6     Uncontrolled with Wellbutrin Had severe anxiety and insomnia with Zoloft 50 mg QD, discontinued Did not have benefit with Lexapro in the past Followed by psychiatry in [place]      Hyperlipidemia   Relevant Medications   rosuvastatin (CRESTOR) 10 MG tablet   losartan-hydrochlorothiazide (HYZAAR) 100-12.5 MG tablet   RUQ abdominal pain    Intermittent episodes of RUQ and epigastric pain could be biliary colic Check [place] RUQ abdomen Avoid oily or fatty food Continue pantoprazole for GERD      Relevant Orders   US Abdomen Limited RUQ (LIVER/GB)   Bilateral hip pain    Unclear if due to OA of hip or DDD of lumbar spine Check x-ray of lumbar spine and  pelvis Tylenol arthritis as needed for pain      Relevant Orders   DG Pelvis 1-2 Views   Other Visit Diagnoses     Postmenopausal       Relevant Orders   DG Bone Density      Meds ordered this encounter  Medications   rosuvastatin (CRESTOR) 10 MG tablet    Sig: Take 1 tablet (10 mg total) by mouth daily.    Dispense:  90 tablet    Refill:  1   losartan-hydrochlorothiazide (HYZAAR) 100-12.5 MG tablet    Sig: Take 1 tablet by mouth daily.    Dispense:  90 tablet    Refill:  1    Follow-up: Return in about 4 months (around 08/12/2023) for Annual physical.    Anabel Halon, MD

## 2023-04-12 NOTE — Patient Instructions (Signed)
Please continue to take medications as prescribed.  Please continue to follow low carb diet and perform moderate exercise/walking at least 150 mins/week.  Please get x-ray of lumbar spine and pelvis done at [facility].

## 2023-04-14 DIAGNOSIS — M5136 Other intervertebral disc degeneration, lumbar region: Secondary | ICD-10-CM | POA: Insufficient documentation

## 2023-04-14 DIAGNOSIS — M169 Osteoarthritis of hip, unspecified: Secondary | ICD-10-CM | POA: Insufficient documentation

## 2023-04-14 DIAGNOSIS — R1011 Right upper quadrant pain: Secondary | ICD-10-CM | POA: Insufficient documentation

## 2023-04-14 DIAGNOSIS — M25551 Pain in right hip: Secondary | ICD-10-CM | POA: Insufficient documentation

## 2023-04-14 LAB — MICROALBUMIN / CREATININE URINE RATIO
Creatinine, Urine: 32.6 mg/dL
Microalb/Creat Ratio: <9 mg/g creat (ref 0–29)
Microalbumin, Urine: <3 ug/mL

## 2023-04-14 NOTE — Assessment & Plan Note (Signed)
Unclear if due to OA of hip or DDD of lumbar spine Check x-ray of lumbar spine and pelvis Tylenol arthritis as needed for pain

## 2023-04-14 NOTE — Assessment & Plan Note (Signed)
Intermittent episodes of RUQ and epigastric pain could be biliary colic Check [place] RUQ abdomen Avoid oily or fatty food Continue pantoprazole for GERD

## 2023-04-14 NOTE — Assessment & Plan Note (Signed)
Flowsheet Row Office Visit from 12/07/2022 in [facility]  PHQ-9 Total Score 6      Uncontrolled with Wellbutrin Had severe anxiety and insomnia with Zoloft 50 mg QD, discontinued Did not have benefit with Lexapro in the past Followed by psychiatry in [place]

## 2023-04-14 NOTE — Assessment & Plan Note (Signed)
Has chronic low back pain -likely due to DDD of lumbar spine Check x-ray of lumbar spine Avoid heavy lifting and frequent Bendi Tylenol arthritis as needed for pain Avoid oral NSAIDs due to GERD

## 2023-04-17 ENCOUNTER — Ambulatory Visit (HOSPITAL_COMMUNITY)
Admission: RE | Admit: 2023-04-17 | Discharge: 2023-04-17 | Disposition: A | Discharge status: Home / Self Care | Payer: Medicare Other | Source: Ambulatory Visit | Attending: Internal Medicine | Admitting: Internal Medicine

## 2023-04-17 DIAGNOSIS — M25552 Pain in left hip: Secondary | ICD-10-CM | POA: Diagnosis not present

## 2023-04-17 DIAGNOSIS — M5136 Other intervertebral disc degeneration, lumbar region: Secondary | ICD-10-CM | POA: Insufficient documentation

## 2023-04-17 DIAGNOSIS — M545 Low back pain, unspecified: Secondary | ICD-10-CM | POA: Diagnosis not present

## 2023-04-17 DIAGNOSIS — M25551 Pain in right hip: Secondary | ICD-10-CM | POA: Diagnosis not present

## 2023-04-27 ENCOUNTER — Encounter (HOSPITAL_COMMUNITY): Payer: Self-pay | Admitting: Psychiatry

## 2023-04-27 ENCOUNTER — Ambulatory Visit (HOSPITAL_COMMUNITY)
Admission: RE | Admit: 2023-04-27 | Discharge: 2023-04-27 | Disposition: A | Discharge status: Home / Self Care | Payer: Medicare Other | Source: Ambulatory Visit | Attending: Internal Medicine | Admitting: Internal Medicine

## 2023-04-27 ENCOUNTER — Ambulatory Visit (HOSPITAL_COMMUNITY): Payer: Medicare Other | Admitting: Psychiatry

## 2023-04-27 VITALS — BP 122/78 | HR 77 | Temp 98.8°F | Ht 62.0 in | Wt 182.0 lb

## 2023-04-27 DIAGNOSIS — R1011 Right upper quadrant pain: Secondary | ICD-10-CM | POA: Diagnosis not present

## 2023-04-27 DIAGNOSIS — F411 Generalized anxiety disorder: Secondary | ICD-10-CM

## 2023-04-27 DIAGNOSIS — F331 Major depressive disorder, recurrent, moderate: Secondary | ICD-10-CM | POA: Diagnosis not present

## 2023-04-27 MED ORDER — BUPROPION HCL ER (SR) 200 MG PO TB12: 200.0000 mg | ORAL_TABLET | Freq: Every day | ORAL | 4 refills | Status: DC

## 2023-04-27 NOTE — Progress Notes (Signed)
[facility] Follow up visit  Patient Identification: Carolyn [name] MRN:  [id number] Date of Evaluation:  04/27/2023 Referral Source: primary care Chief Complaint:   No chief complaint on file. Follow up depression  Visit Diagnosis:    ICD-10-CM   1. MDD (major depressive disorder), recurrent episode, moderate (HCC)  F33.1     2. GAD (generalized anxiety disorder)  F41.1       History of Present Illness:  3 years ol married white female, initially referred by pcp to establish care for depression, anxiety, currently Retired,    Doing fair, moving to [place] near daughter Depression manageable, keeps busy with gardening   Although last daughter in Social worker in accident. Son is managing grief It effected her but doing better   Wellbutrin is helping depression     Aggravating factor: medical co morbidities, some difficult childhood , retirement,  husband sick with one kidney Modifying factor: husband johova witness   Severity stable Duration all adult life   Past Psychiatric History: depression, anxiety  Previous Psychotropic Medications: Yes   Substance Abuse History in the last 12 months:  No.  Consequences of Substance Abuse: NA  Past Medical History:  Past Medical History:  Diagnosis Date   Depression    Diabetes mellitus without complication (HCC)    Diastolic dysfunction    Hyperlipidemia    Myasthenia gravis (HCC)    Patient is Jehovah's Witness    Pulmonary embolism (HCC) 05/18/2021   Sleep apnea     Past Surgical History:  Procedure Laterality Date   APPENDECTOMY     BIOPSY  11/11/2020   Procedure: BIOPSY;  Surgeon: [name], MD;  [facility];  Service: Gastroenterology;;  duodenum gastric   BUNIONECTOMY     COLONOSCOPY WITH PROPOFOL N/A 11/11/2020   Procedure: COLONOSCOPY WITH PROPOFOL;  Surgeon: [name], MD;  [facility];  Service: Gastroenterology;  Laterality: N/A;  11:15   ESOPHAGEAL DILATION   03/23/2023   Procedure: ESOPHAGEAL DILATION;  Surgeon: Marguerita Merles, Reuel Boom, MD;  [facility];  Service: Gastroenterology;;   ESOPHAGOGASTRODUODENOSCOPY (EGD) WITH PROPOFOL N/A 11/11/2020   Procedure: ESOPHAGOGASTRODUODENOSCOPY (EGD) WITH PROPOFOL;  Surgeon: [name], MD;  [facility];  Service: Gastroenterology;  Laterality: N/A;  to arrive at 0845   ESOPHAGOGASTRODUODENOSCOPY (EGD) WITH PROPOFOL N/A 03/23/2023   Procedure: ESOPHAGOGASTRODUODENOSCOPY (EGD) WITH PROPOFOL;  Surgeon: [name], MD;  [facility];  Service: Gastroenterology;  Laterality: N/A;  7:30am;asa 2   MANDIBLE SURGERY     TONSILECTOMY, ADENOIDECTOMY, BILATERAL MYRINGOTOMY AND TUBES      Family Psychiatric History: son; bipolar,  Mom : depression Dad ; alcohol use  Family History:  Family History  Problem Relation Age of Onset   Arthritis Mother    Depression Mother    Diabetes Mother    Hypertension Mother    Alcohol abuse Father    Hypertension Father    Heart disease Father 47       Massive MI   Diabetes Brother    Diabetes Brother    Heart disease Brother    Diabetes Brother        Type I     Social History:   Social History   Socioeconomic History   Marital status: Married    Spouse name: Casimiro Needle   Number of children: 4   Years of education: some college   Highest education level: Some college, no degree  Occupational History  Occupation: Retired  Tobacco Use   Smoking status: Never    Passive exposure: Never   Smokeless tobacco: Never  Vaping Use   Vaping Use: Never used  Substance and Sexual Activity   Alcohol use: Yes    Comment: socially on rare occasions   Drug use: No   Sexual activity: Yes    Birth control/protection: Other-see comments    Comment: postmenopausal  Other Topics Concern   Not on file  Social History Narrative   Not on file   Social Determinants of Health   Financial Resource Strain: Low  Risk  (06/19/2020)   Overall Financial Resource Strain (CARDIA)    Difficulty of Paying Living Expenses: Not very hard  Food Insecurity: No Food Insecurity (02/10/2021)   Hunger Vital Sign    Worried About Running Out of Food in the Last Year: Never true    Ran Out of Food in the Last Year: Never true  Transportation Needs: No Transportation Needs (02/10/2021)   PRAPARE - Administrator, Civil Service (Medical): No    Lack of Transportation (Non-Medical): No  Physical Activity: Insufficiently Active (02/10/2021)   Exercise Vital Sign    Days of Exercise per Week: 2 days    Minutes of Exercise per Session: 20 min  Stress: No Stress Concern Present (02/10/2021)   [employer] of Occupational Health - Occupational Stress Questionnaire    Feeling of Stress : Not at all  Social Connections: Moderately Integrated (02/10/2021)   Social Connection and Isolation Panel [NHANES]    Frequency of Communication with Friends and Family: More than three times a week    Frequency of Social Gatherings with Friends and Family: Three times a week    Attends Religious Services: More than 4 times per year    Active Member of Clubs or Organizations: No    Attends Banker Meetings: Never    Marital Status: Married    Additional Social History: grew up with parents, dad was alcoholic, some challenges  Married for 44 years now  Allergies:   Allergies  Allergen Reactions   Prozac [Fluoxetine Hcl] Anaphylaxis   Codeine Other (See Comments)    Intolerance    Erythromycin Other (See Comments)   Other     -mycin medications: has myasthenia gravis    Sulfa Antibiotics Hives    Metabolic Disorder Labs: Lab Results  Component Value Date   HGBA1C 7.4 (H) 03/20/2023   MPG 165.68 03/20/2023   MPG 148 12/28/2020   No results found for: "PROLACTIN" Lab Results  Component Value Date   CHOL 164 03/20/2023   TRIG 269 (H) 03/20/2023   HDL 44 03/20/2023   CHOLHDL 3.7 03/20/2023    VLDL 54 (H) 03/20/2023   LDLCALC 66 03/20/2023   LDLCALC 127 (H) 08/02/2022   Lab Results  Component Value Date   TSH 2.570 08/02/2022    Therapeutic Level Labs: No results found for: "LITHIUM" No results found for: "CBMZ" No results found for: "VALPROATE"  Current Medications: Current Outpatient Medications  Medication Sig Dispense Refill   Blood Glucose Monitoring Suppl (BLOOD GLUCOSE SYSTEM PAK) KIT Use as directed to monitor FSBS 2x daily. Dx: E11.9 1 each 1   buPROPion (WELLBUTRIN SR) 200 MG 12 hr tablet Take 1 tablet (200 mg total) by mouth daily. 30 tablet 0   Cholecalciferol (VITAMIN D3) 125 MCG (5000 UT) TABS Take 5,000 Units by mouth daily.     Coenzyme Q10 (CO Q 10 PO) Take 1  capsule by mouth daily.     FARXIGA 10 MG TABS tablet TAKE 1 TABLET(10 MG) BY MOUTH DAILY BEFORE BREAKFAST 90 tablet 2   glucose blood (ONETOUCH VERIO) test strip USE TO TEST BLOOD SUGAR TWICE DAILY 100 strip 3   glucose blood (ONETOUCH VERIO) test strip USE TO TEST BLOOD SUGAR TWICE DAILY 100 strip 3   Glycerin-Hypromellose-PEG 400 (DRY EYE RELIEF DROPS) 0.2-0.2-1 % SOLN Place 1 drop into both eyes daily as needed (Dry eye). homeopathic     hyoscyamine (LEVSIN SL) 0.125 MG SL tablet Place 1 tablet (0.125 mg total) under the tongue every 6 (six) hours as needed (spasms). 30 tablet 1   losartan-hydrochlorothiazide (HYZAAR) 100-12.5 MG tablet Take 1 tablet by mouth daily. 90 tablet 1   MAGNESIUM PO Take 2 tablets by mouth daily at 2 am.     metoprolol succinate (TOPROL-XL) 25 MG 24 hr tablet Take 1 tablet (25 mg total) by mouth daily. 90 tablet 1   Multiple Vitamins-Minerals (MULTI COMPLETE) CAPS Take 1 capsule by mouth daily.     pantoprazole (PROTONIX) 40 MG tablet Take 1 tablet (40 mg total) by mouth daily. 90 tablet 3   rosuvastatin (CRESTOR) 10 MG tablet Take 1 tablet (10 mg total) by mouth daily. 90 tablet 1   VASCEPA 1 g capsule Take 2 capsules (2 g total) by mouth 2 (two) times daily. (Patient  taking differently: Take 1 g by mouth 2 (two) times daily.) 360 capsule 3   No current facility-administered medications for this visit.    Psychiatric Specialty Exam: Review of Systems  Cardiovascular:  Negative for chest pain.  Neurological:  Negative for tremors.  Psychiatric/Behavioral:  Negative for agitation and self-injury.     Blood pressure 122/78, pulse 77, temperature 98.8 F (37.1 C), height 5\' 2"  (1.575 m), weight 182 lb (82.6 kg), SpO2 98 %.Body mass index is 33.29 kg/m.  General Appearance: Casual  Eye Contact:  Fair  Speech:  Clear and Coherent  Volume:  Normal  Moods: fair   Affect:  Congruent  Thought Process:  Goal Directed  Orientation:  Full (Time, Place, and Person)  Thought Content:  Rumination  Suicidal Thoughts:  No  Homicidal Thoughts:  No  Memory:  Immediate;   Fair  Judgement:  Fair  Insight:  Fair  Psychomotor Activity:  Normal  Concentration:  Concentration: Fair  Recall:  Fair  Fund of Knowledge:Fair  Language: Good  Akathisia:  No  Handed:    AIMS (if indicated):  not done  Assets:  Desire for Improvement Financial Resources/Insurance Housing Leisure Time  ADL's:  Intact  Cognition: WNL  Sleep:  Fair   Screenings: GAD-7    Flowsheet Row Office Visit from 12/07/2022 in [facility] Office Visit from 03/30/2022 in [facility] Office Visit from 02/22/2022 in [facility] Care Office Visit from 08/31/2021 in [facility] Care Office Visit from 08/19/2021 in [facility] Primary Care  Total GAD-7 Score 5 12 21 6 13       PHQ2-9    Flowsheet Row Office Visit from 04/12/2023 in [facility] Nutrition from 03/09/2023 in [facility] at [place] Office Visit from 03/06/2023 in [facility] Office Visit from 12/07/2022 in [facility] Care Office Visit from  08/02/2022 in [facility]  PHQ-2 Total Score 1 0 0 2 2  PHQ-9 Total Score -- -- --  6 13      Flowsheet Row Admission (Discharged) from 03/23/2023 in [facility] Office Visit from 08[facility]04/2022 in [facility] at [facility] Counselor from 06/09/2022 in [facility] at [facility]  C-SSRS RISK CATEGORY No Risk No Risk No Risk       Assessment and Plan: as follows   Prior documentation reviewed  Major depressive disorder recurrent moderate; manageable continue wellbutrin  Generalized anxiety disorder; fair continue coping skills  Grief : handling it , continue coping skills and wellbutrin  Patient moving to [facility] in August, can initiate discharge. Have sent 3 -4 months refill. Call for concerns in the interim Remains stable Direct care time spent 20 minutes  including face to face , chart review   Thresa Ross, MD 6/27/202412:34 PM

## 2023-05-01 ENCOUNTER — Ambulatory Visit (HOSPITAL_COMMUNITY)
Admission: RE | Admit: 2023-05-01 | Discharge: 2023-05-01 | Disposition: A | Discharge status: Home / Self Care | Payer: Medicare Other | Source: Ambulatory Visit | Attending: Internal Medicine | Admitting: Internal Medicine

## 2023-05-01 DIAGNOSIS — Z780 Asymptomatic menopausal state: Secondary | ICD-10-CM | POA: Diagnosis not present

## 2023-05-15 ENCOUNTER — Ambulatory Visit: Payer: Medicare Other | Admitting: Nutrition

## 2023-05-24 DIAGNOSIS — G4733 Obstructive sleep apnea (adult) (pediatric): Secondary | ICD-10-CM | POA: Diagnosis not present

## 2023-05-25 ENCOUNTER — Encounter: Payer: Self-pay | Admitting: Internal Medicine

## 2023-05-25 ENCOUNTER — Other Ambulatory Visit: Payer: Self-pay | Admitting: Internal Medicine

## 2023-05-25 DIAGNOSIS — N760 Acute vaginitis: Secondary | ICD-10-CM

## 2023-05-25 MED ORDER — FLUCONAZOLE 150 MG PO TABS
150.0000 mg | ORAL_TABLET | ORAL | 0 refills | Status: DC
Start: 2023-05-25 — End: 2023-06-15

## 2023-06-15 ENCOUNTER — Ambulatory Visit (INDEPENDENT_AMBULATORY_CARE_PROVIDER_SITE_OTHER): Payer: Medicare Other | Admitting: Gastroenterology

## 2023-06-15 ENCOUNTER — Encounter (INDEPENDENT_AMBULATORY_CARE_PROVIDER_SITE_OTHER): Payer: Self-pay | Admitting: Gastroenterology

## 2023-06-15 VITALS — BP 103/71 | HR 79 | Temp 98.0°F | Ht 62.0 in | Wt 180.8 lb

## 2023-06-15 DIAGNOSIS — K219 Gastro-esophageal reflux disease without esophagitis: Secondary | ICD-10-CM

## 2023-06-15 DIAGNOSIS — K5900 Constipation, unspecified: Secondary | ICD-10-CM

## 2023-06-15 DIAGNOSIS — K224 Dyskinesia of esophagus: Secondary | ICD-10-CM

## 2023-06-15 DIAGNOSIS — K760 Fatty (change of) liver, not elsewhere classified: Secondary | ICD-10-CM | POA: Diagnosis not present

## 2023-06-15 MED ORDER — HYOSCYAMINE SULFATE 0.125 MG SL SUBL: 0.1250 mg | SUBLINGUAL_TABLET | Freq: Four times a day (QID) | SUBLINGUAL | 1 refills | Status: AC | PRN

## 2023-06-15 NOTE — Patient Instructions (Signed)
Continue with protonix 40mg  daily  Be mindful of greasy, spicy, fried, citrus foods, caffeine, carbonated drinks, and chocolate as these can increase reflux symptoms Stay upright 2-3 hours after eating, prior to lying down and avoid eating late in the evenings.  Continue to use Levsin 0.125mg  up to every 6 hours as needed for esophageal spasms  Please keep appt at [facility] for manometry testing, we will be in touch once the results of this are sent back to [place]  In regards to fatty liver seen on recent Ultrasound, I am providing the mediterranean diet, this is a guideline to help you know which foods you should eat and those you should try to limit or avoid. It is important to make sure you are getting atleast 30 minutes of exercise 4-5x/week You should aim for 5-7% decrease in overall weight. Fatty liver is usually well managed with dietary and lifestyle modifications, however, in rare cases, this can progress to fibrosis/cirrhosis of the liver.   Follow up 6 months  It was a pleasure to see you today. I want to create trusting relationships with patients and provide genuine, compassionate, and quality care. I truly value your feedback! please be on the lookout for a survey regarding your visit with me today. I appreciate your input about our visit and your time in completing this!    Kathyleen Radice L. Jeanmarie Hubert, MSN, APRN, AGNP-C Adult-Gerontology Nurse Practitioner [facility]

## 2023-06-15 NOTE — Progress Notes (Addendum)
Referring Provider: Anabel Halon, MD Primary Care Physician:  Anabel Halon, MD Primary GI Physician: Dr. Levon Hedger   Chief Complaint  Patient presents with   Constipation    Follow up on constipation. Still has to take stool softner or eat prunes to have BM but states she is good with current regimen she is doing. Takes takes fiber daily.    Gastroesophageal Reflux    Follow up on GERD. takes protonix in the evening. Has some breakthrough symptoms almost daily.    esophageal spasms    Follow up on esophageal spasms. Forgot about hysocamine and states it did help when she was taking it.    HPI:   Carolyn [name] is a 72 y.o. female with past medical history of  DM, depression HLD, Myasthenia Gravis and IBS   Patient presenting today for follow up of GERD, esophageal spasms and IBS  Last seen April 2024, at that time using fiber, still straining some, though having a BM usually daily. On protonix 40mg  daily, occasional reflux if bending over too soon after eating. Heartburn maybe 1-2 x/week. Having some issues with esophageal spasms  Recommended to schedule EGD, continue fiber and good water intake, consider anorectal manometry, levsin PRN for esophageal spasm, TIF brochure provided.  EGD as below, recommended to Schedule esophageal manometry and pH impedance testing off PPI - referral to [facility]. Schedule barium esophagram.  Present: She states she is doing okay. Feels that esophageal spasms are less frequent than before. She is not taking anything currently. She has taken hyosciamine a few times which seemed to help though reports she has been moving recently and had forgotten about taking this. Taking protonix 40mg  daily, She has some occasional breakthrough heartburn, usually related to a trigger food. She is having some dysphagia a few times per week that can occur with anything, to include water. Denies abdominal pain, rectal bleeding, or melena. She has some  occasional nausea, no vomiting, she has not been able to identify precipitating factors, she does not take anything for this and does not know of anything that improves symptoms.    Constipation is pretty well managed with using fiber, 1-2 x/week she may use move tea, blueberries or prune juice to help if she is feeling constipated. She is trying to drink water regularly.   She has esophageal manometry set up in September.   Recent RUQ [place] in June with hepatic steatosis  BPE: 03/2023 abnormal esophageal contractions, recommended to continue with esophageal manometry  Last Colonoscopy: 11/11/2020, normal colon with presence of internal hemorrhoids Last Endoscopy: 03/2023 Abnormal esophageal motility, suspicious for                            esophageal spasm. Dilated.                           - Normal stomach.                           - Normal examined duodenum.                           - 1 cm hiatal hernia.                           - No specimens collected.  Recommendations:    Past Medical History:  Diagnosis Date   Depression    Diabetes mellitus without complication (HCC)    Diastolic dysfunction    Hyperlipidemia    Myasthenia gravis (HCC)    Patient is Jehovah's Witness    Pulmonary embolism (HCC) 05/18/2021   Sleep apnea     Past Surgical History:  Procedure Laterality Date   APPENDECTOMY     BIOPSY  11/11/2020   Procedure: BIOPSY;  Surgeon: [name], MD;  [facility]: [facility];  Service: Gastroenterology;;  duodenum gastric   BUNIONECTOMY     COLONOSCOPY WITH PROPOFOL N/A 11/11/2020   Procedure: COLONOSCOPY WITH PROPOFOL;  Surgeon: [name], MD;  Location: [facility];  Service: Gastroenterology;  Laterality: N/A;  11:15   ESOPHAGEAL DILATION  03/23/2023   Procedure: ESOPHAGEAL DILATION;  Surgeon: Marguerita Merles, Reuel Boom, MD;  Location: [facility];  Service: Gastroenterology;;   ESOPHAGOGASTRODUODENOSCOPY (EGD) WITH PROPOFOL  N/A 11/11/2020   Procedure: ESOPHAGOGASTRODUODENOSCOPY (EGD) WITH PROPOFOL;  Surgeon: [name], MD;  Location: [facility];  Service: Gastroenterology;  Laterality: N/A;  to arrive at 0845   ESOPHAGOGASTRODUODENOSCOPY (EGD) WITH PROPOFOL N/A 03/23/2023   Procedure: ESOPHAGOGASTRODUODENOSCOPY (EGD) WITH PROPOFOL;  Surgeon: [name], MD;  Location: [facility];  Service: Gastroenterology;  Laterality: N/A;  7:30am;asa 2   MANDIBLE SURGERY     TONSILECTOMY, ADENOIDECTOMY, BILATERAL MYRINGOTOMY AND TUBES      Current Outpatient Medications  Medication Sig Dispense Refill   Blood Glucose Monitoring Suppl (BLOOD GLUCOSE SYSTEM PAK) KIT Use as directed to monitor FSBS 2x daily. Dx: E11.9 1 each 1   buPROPion (WELLBUTRIN SR) 200 MG 12 hr tablet Take 1 tablet (200 mg total) by mouth daily. 30 tablet 4   Cholecalciferol (VITAMIN D3) 125 MCG (5000 UT) TABS Take 5,000 Units by mouth daily.     Coenzyme Q10 (CO Q 10 PO) Take 1 capsule by mouth daily.     FARXIGA 10 MG TABS tablet TAKE 1 TABLET(10 MG) BY MOUTH DAILY BEFORE BREAKFAST 90 tablet 2   glucose blood (ONETOUCH VERIO) test strip USE TO TEST BLOOD SUGAR TWICE DAILY 100 strip 3   glucose blood (ONETOUCH VERIO) test strip USE TO TEST BLOOD SUGAR TWICE DAILY 100 strip 3   Glycerin-Hypromellose-PEG 400 (DRY EYE RELIEF DROPS) 0.2-0.2-1 % SOLN Place 1 drop into both eyes daily as needed (Dry eye). homeopathic     losartan-hydrochlorothiazide (HYZAAR) 100-12.5 MG tablet Take 1 tablet by mouth daily. 90 tablet 1   MAGNESIUM PO Take 2 tablets by mouth daily at 2 am.     metoprolol succinate (TOPROL-XL) 25 MG 24 hr tablet Take 1 tablet (25 mg total) by mouth daily. 90 tablet 1   Multiple Vitamins-Minerals (MULTI COMPLETE) CAPS Take 1 capsule by mouth daily.     pantoprazole (PROTONIX) 40 MG tablet Take 1 tablet (40 mg total) by mouth daily. 90 tablet 3   rosuvastatin (CRESTOR) 10 MG tablet Take 1 tablet (10 mg total) by  mouth daily. 90 tablet 1   VASCEPA 1 g capsule Take 2 capsules (2 g total) by mouth 2 (two) times daily. (Patient taking differently: Take 1 g by mouth 2 (two) times daily.) 360 capsule 3   Wheat Dextrin (BENEFIBER PO) Take by mouth. Takes one tablespoon tid     hyoscyamine (LEVSIN SL) 0.125 MG SL tablet Place 1 tablet (0.125 mg total) under the tongue every 6 (six) hours as needed (spasms). 60 tablet  1   No current facility-administered medications for this visit.    Allergies as of 06/15/2023 - Review Complete 06/15/2023  Allergen Reaction Noted   Prozac [fluoxetine hcl] Anaphylaxis 12/16/2015   Codeine Other (See Comments) 12/16/2015   Erythromycin Other (See Comments) 12/16/2015   Other  10/30/2017   Sulfa antibiotics Hives 12/16/2015    Family History  Problem Relation Age of Onset   Arthritis Mother    Depression Mother    Diabetes Mother    Hypertension Mother    Alcohol abuse Father    Hypertension Father    Heart disease Father 72       Massive MI   Diabetes Brother    Diabetes Brother    Heart disease Brother    Diabetes Brother        Type I     Social History   Socioeconomic History   Marital status: Married    Spouse name: Casimiro Needle   Number of children: 4   Years of education: some college   Highest education level: Some college, no degree  Occupational History   Occupation: Retired  Tobacco Use   Smoking status: Never    Passive exposure: Never   Smokeless tobacco: Never  Vaping Use   Vaping status: Never Used  Substance and Sexual Activity   Alcohol use: Yes    Comment: socially on rare occasions   Drug use: No   Sexual activity: Yes    Birth control/protection: Other-see comments    Comment: postmenopausal  Other Topics Concern   Not on file  Social History Narrative   Not on file   Social Determinants of Health   Financial Resource Strain: Low Risk  (06/19/2020)   Overall Financial Resource Strain (CARDIA)    Difficulty of Paying Living  Expenses: Not very hard  Food Insecurity: No Food Insecurity (02/10/2021)   Hunger Vital Sign    Worried About Running Out of Food in the Last Year: Never true    Ran Out of Food in the Last Year: Never true  Transportation Needs: No Transportation Needs (02/10/2021)   PRAPARE - Administrator, Civil Service (Medical): No    Lack of Transportation (Non-Medical): No  Physical Activity: Insufficiently Active (02/10/2021)   Exercise Vital Sign    Days of Exercise per Week: 2 days    Minutes of Exercise per Session: 20 min  Stress: No Stress Concern Present (02/10/2021)   [employer] of Occupational Health - Occupational Stress Questionnaire    Feeling of Stress : Not at all  Social Connections: Moderately Integrated (02/10/2021)   Social Connection and Isolation Panel [NHANES]    Frequency of Communication with Friends and Family: More than three times a week    Frequency of Social Gatherings with Friends and Family: Three times a week    Attends Religious Services: More than 4 times per year    Active Member of Clubs or Organizations: No    Attends Banker Meetings: Never    Marital Status: Married    Review of systems General: negative for malaise, night sweats, fever, chills, weight los Neck: Negative for lumps, goiter, pain and significant neck swelling Resp: Negative for cough, wheezing, dyspnea at rest CV: Negative for chest pain, leg swelling, palpitations, orthopnea GI: denies melena, hematochezia, nausea, vomiting, diarrhea, constipation, dysphagia, odyonophagia, early satiety or unintentional weight loss.  MSK: Negative for joint pain or swelling, back pain, and muscle pain. Derm: Negative for itching or rash Psych:  Denies depression, anxiety, memory loss, confusion. No homicidal or suicidal ideation.  Heme: Negative for prolonged bleeding, bruising easily, and swollen nodes. Endocrine: Negative for cold or heat intolerance, polyuria, polydipsia and  goiter. Neuro: negative for tremor, gait imbalance, syncope and seizures. The remainder of the review of systems is noncontributory.  Physical Exam: BP 103/71 (BP Location: Left Arm, Patient Position: Sitting, Cuff Size: Large)   Pulse 79   Temp 98 F (36.7 C) (Oral)   Ht 5\' 2"  (1.575 m)   Wt 180 lb 12.8 oz (82 kg)   BMI 33.07 kg/m  General:   Alert and oriented. No distress noted. Pleasant and cooperative.  Head:  Normocephalic and atraumatic. Eyes:  Conjuctiva clear without scleral icterus. Mouth:  Oral mucosa pink and moist. Good dentition. No lesions. Heart: Normal rate and rhythm, s1 and s2 heart sounds present.  Lungs: Clear lung sounds in all lobes. Respirations equal and unlabored. Abdomen:  +BS, soft, non-tender and non-distended. No rebound or guarding. No HSM or masses noted. Derm: No palmar erythema or jaundice Msk:  Symmetrical without gross deformities. Normal posture. Extremities:  Without edema. Neurologic:  Alert and  oriented x4 Psych:  Alert and cooperative. Normal mood and affect.  Invalid input(s): "6 MONTHS"   ASSESSMENT: STACI DOONAN is a 72 y.o. female presenting today for GERD/Esophageal spasm, Constipation and with recent US showing hepatic steatosis.   GERD/esophageal spasm: GERD pretty well controlled on protonix 40mg  daily, occasional reflux if she eats a trigger food. Has occasional esophageal spasm, though less frequently than previously. Has used levsin for spasms with good result. She had recent EGD concerning for esophageal spasms, followed by Barium swallow which showed esophageal dysmotility, she was recommended to have ph impedence and esophageal manometry testing for further evaluation and in preparation of possible TIF procedure. Scheduled [facility] at [facility] next month. Will continue with current PPI regimen, levsin PRN, and should keep appt at [facility] for further testing.  Constipation: pretty well managed with benefiber. Using move tea,  blueberries or prunes 1-2x/week if feeling more constipated. Trying to maintain good water intake. Recommend continuing with current regimen, Increase water intake, aim for atleast 64 oz per day, Increase fruits, veggies and whole grains, kiwi and prunes are especially good for constipation  Hepatic steatosis: as noted on recent right upper quadrant ultrasound in June ordered by her PCP. FIb4 score 1.27, likely excluding advanced fibrosis, LFTs and plt count are WNL.  I discussed importance of good weight management, implementing Mediterranean diet and routine exercise with the patient.   PLAN:  Continue with protonix 40mg  daily  2. Good reflux precautions  3. Levsin 0.125mg  Q6h PRN  4. Keep esophageal manometry/pH impedence testing 5. Increase water intake, aim for atleast 64 oz per day Increase fruits, veggies and whole grains, kiwi and prunes are especially good for constipation 6. Mediterranean diet, routine exercise and good weight management 7. Continue benefiber   All questions were answered, patient verbalized understanding and is in agreement with plan as outlined above.   Follow Up: 6 months   Ronin Crager L. Jeanmarie Hubert, MSN, APRN, AGNP-C Adult-Gerontology Nurse Practitioner [facility]  I have reviewed the note and agree with the APP's assessment as described in this progress note  [name], MD Gastroenterology and Hepatology [facility]

## 2023-06-22 DIAGNOSIS — Z1231 Encounter for screening mammogram for malignant neoplasm of breast: Secondary | ICD-10-CM | POA: Diagnosis not present

## 2023-06-22 LAB — HM MAMMOGRAPHY

## 2023-07-06 ENCOUNTER — Ambulatory Visit: Payer: Medicare Other | Admitting: Urology

## 2023-07-15 ENCOUNTER — Other Ambulatory Visit: Payer: Self-pay | Admitting: Internal Medicine

## 2023-07-15 DIAGNOSIS — I10 Essential (primary) hypertension: Secondary | ICD-10-CM

## 2023-07-17 ENCOUNTER — Ambulatory Visit: Payer: Medicare Other | Admitting: Nutrition

## 2023-07-27 ENCOUNTER — Encounter: Payer: Self-pay | Admitting: Nutrition

## 2023-07-27 NOTE — Patient Instructions (Addendum)
Goals Eat 3 meals per day at times discussed Focus on eating more whole plant based foods from a garden. Cut out processed foods, sweets and junk food Drink only water Avoid snacks, sweets and don't eat after 7 pm. Exercise 30 minutes a day

## 2023-08-10 ENCOUNTER — Ambulatory Visit: Payer: Medicare Other | Admitting: Urology

## 2023-08-15 ENCOUNTER — Ambulatory Visit: Payer: Medicare Other | Admitting: Internal Medicine

## 2023-08-15 ENCOUNTER — Encounter: Payer: Self-pay | Admitting: Internal Medicine

## 2023-08-15 VITALS — BP 138/82 | HR 85 | Ht 62.0 in | Wt 180.6 lb

## 2023-08-15 DIAGNOSIS — E559 Vitamin D deficiency, unspecified: Secondary | ICD-10-CM

## 2023-08-15 DIAGNOSIS — K760 Fatty (change of) liver, not elsewhere classified: Secondary | ICD-10-CM

## 2023-08-15 DIAGNOSIS — I10 Essential (primary) hypertension: Secondary | ICD-10-CM

## 2023-08-15 DIAGNOSIS — Z0001 Encounter for general adult medical examination with abnormal findings: Secondary | ICD-10-CM | POA: Diagnosis not present

## 2023-08-15 DIAGNOSIS — M160 Bilateral primary osteoarthritis of hip: Secondary | ICD-10-CM

## 2023-08-15 DIAGNOSIS — E782 Mixed hyperlipidemia: Secondary | ICD-10-CM

## 2023-08-15 DIAGNOSIS — Z23 Encounter for immunization: Secondary | ICD-10-CM | POA: Diagnosis not present

## 2023-08-15 DIAGNOSIS — F339 Major depressive disorder, recurrent, unspecified: Secondary | ICD-10-CM

## 2023-08-15 DIAGNOSIS — Z7984 Long term (current) use of oral hypoglycemic drugs: Secondary | ICD-10-CM

## 2023-08-15 DIAGNOSIS — K219 Gastro-esophageal reflux disease without esophagitis: Secondary | ICD-10-CM

## 2023-08-15 DIAGNOSIS — E1169 Type 2 diabetes mellitus with other specified complication: Secondary | ICD-10-CM

## 2023-08-15 MED ORDER — ROSUVASTATIN CALCIUM 10 MG PO TABS
10.0000 mg | ORAL_TABLET | Freq: Every day | ORAL | 1 refills | Status: AC
Start: 2023-08-15 — End: ?

## 2023-08-15 NOTE — Assessment & Plan Note (Addendum)
BP Readings from Last 1 Encounters:  08/15/23 138/82   Usually well-controlled with  Losartan-HCTZ 100-12.5 mg QD and Metoprolol 25 mg QD Counseled for compliance with the medications Advised DASH diet and moderate exercise/walking, at least 150 mins/week

## 2023-08-15 NOTE — Assessment & Plan Note (Addendum)
Flowsheet Row Office Visit from 08/15/2023 in [facility]  PHQ-9 Total Score 7      Well-controlled, on Wellbutrin 200 mg SR QD Had severe anxiety and insomnia with Zoloft 50 mg QD, discontinued Did not have benefit with Lexapro in the past Used to be followed by psychiatry in [place]

## 2023-08-15 NOTE — Assessment & Plan Note (Signed)
Physical exam as documented. Fasting blood tests today. Flu vaccine today.

## 2023-08-15 NOTE — Assessment & Plan Note (Addendum)
Lab Results  Component Value Date   HGBA1C 7.4 (H) 03/20/2023   Associated with HLD and HTN Uncontrolled, but she is trying to improve her diet -followed by nutritionist On Farxiga, did not tolerate Metformin Advised to follow diabetic diet On ARB and statin

## 2023-08-15 NOTE — Progress Notes (Signed)
Established Patient Office Visit  Subjective:  Patient ID: Carolyn [name], female    DOB: November 12, 1950  Age: 72 y.o. MRN: [id number]  CC:  Chief Complaint  Patient presents with   Annual Exam    HPI Carolyn [name] is a 72 y.o. female with past medical history of HTN, PE, OSA, IBS, DM, HLD, depression and obesity who presents for annual physical.  HTN: BP is wnl. She takes Losartan-HCTZ regularly. Patient denies headache, dizziness, chest pain, dyspnea or palpitations.   Type II DM: She takes [place] for it. Her last HbA1c was 7.4 in 05/24. Denies any polyuria or polyphagia currently.  She has had nutritionist counseling and has been trying to improve her diet.  She has been taking statin and Vascepa for HLD.  Depression: She is doing well overall with Wellbutrin SR 200 mg QD.  She has been recently stressed due to her husband's stroke.  She used to see psychiatry, but has been discharged now.  She denies any SI or HI currently. She has been on Zoloft, Lexapro and Prozac in the past, which were ineffective.    Past Medical History:  Diagnosis Date   Depression    Diabetes mellitus without complication (HCC)    Diastolic dysfunction    Hyperlipidemia    Myasthenia gravis (HCC)    Patient is Jehovah's Witness    Pulmonary embolism (HCC) 05/18/2021   Sleep apnea     Past Surgical History:  Procedure Laterality Date   APPENDECTOMY     BIOPSY  11/11/2020   Procedure: BIOPSY;  Surgeon: [name], MD;  Location: [facility];  Service: Gastroenterology;;  duodenum gastric   BUNIONECTOMY     COLONOSCOPY WITH PROPOFOL N/A 11/11/2020   Procedure: COLONOSCOPY WITH PROPOFOL;  Surgeon: [name], MD;  Location: [facility];  Service: Gastroenterology;  Laterality: N/A;  11:15   ESOPHAGEAL DILATION  03/23/2023   Procedure: ESOPHAGEAL DILATION;  Surgeon: Marguerita Merles, Reuel Boom, MD;  Location: [facility];  Service: Gastroenterology;;    ESOPHAGOGASTRODUODENOSCOPY (EGD) WITH PROPOFOL N/A 11/11/2020   Procedure: ESOPHAGOGASTRODUODENOSCOPY (EGD) WITH PROPOFOL;  Surgeon: [name], MD;  [facility]: [facility];  Service: Gastroenterology;  Laterality: N/A;  to arrive at 0845   ESOPHAGOGASTRODUODENOSCOPY (EGD) WITH PROPOFOL N/A 03/23/2023   Procedure: ESOPHAGOGASTRODUODENOSCOPY (EGD) WITH PROPOFOL;  Surgeon: [name], MD;  Location: [facility];  Service: Gastroenterology;  Laterality: N/A;  7:30am;asa 2   MANDIBLE SURGERY     TONSILECTOMY, ADENOIDECTOMY, BILATERAL MYRINGOTOMY AND TUBES      Family History  Problem Relation Age of Onset   Arthritis Mother    Depression Mother    Diabetes Mother    Hypertension Mother    Alcohol abuse Father    Hypertension Father    Heart disease Father 58       Massive MI   Diabetes Brother    Diabetes Brother    Heart disease Brother    Diabetes Brother        Type I     Social History   Socioeconomic History   Marital status: Married    Spouse name: Casimiro Needle   Number of children: 4   Years of education: some college   Highest education level: Some college, no degree  Occupational History   Occupation: Retired  Tobacco Use   Smoking status: Never    Passive exposure: Never   Smokeless tobacco: Never  Vaping Use   Vaping status: Never Used  Substance and Sexual Activity   Alcohol use: Yes    Comment: socially on rare occasions   Drug use: No   Sexual activity: Yes    Birth control/protection: Other-see comments    Comment: postmenopausal  Other Topics Concern   Not on file  Social History Narrative   Not on file   Social Determinants of Health   Financial Resource Strain: Low Risk  (06/19/2020)   Overall Financial Resource Strain (CARDIA)    Difficulty of Paying Living Expenses: Not very hard  Food Insecurity: No Food Insecurity (02/10/2021)   Hunger Vital Sign    Worried About Running Out of Food in the Last Year: Never true     Ran Out of Food in the Last Year: Never true  Transportation Needs: No Transportation Needs (02/10/2021)   PRAPARE - Administrator, Civil Service (Medical): No    Lack of Transportation (Non-Medical): No  Physical Activity: Insufficiently Active (02/10/2021)   Exercise Vital Sign    Days of Exercise per Week: 2 days    Minutes of Exercise per Session: 20 min  Stress: No Stress Concern Present (02/10/2021)   [employer] of Occupational Health - Occupational Stress Questionnaire    Feeling of Stress : Not at all  Social Connections: Moderately Integrated (02/10/2021)   Social Connection and Isolation Panel [NHANES]    Frequency of Communication with Friends and Family: More than three times a week    Frequency of Social Gatherings with Friends and Family: Three times a week    Attends Religious Services: More than 4 times per year    Active Member of Clubs or Organizations: No    Attends Banker Meetings: Never    Marital Status: Married  Catering manager Violence: Not At Risk (02/10/2021)   Humiliation, Afraid, Rape, and Kick questionnaire    Fear of Current or Ex-Partner: No    Emotionally Abused: No    Physically Abused: No    Sexually Abused: No    Outpatient Medications Prior to Visit  Medication Sig Dispense Refill   Blood Glucose Monitoring Suppl (BLOOD GLUCOSE SYSTEM PAK) KIT Use as directed to monitor FSBS 2x daily. Dx: E11.9 1 each 1   buPROPion (WELLBUTRIN SR) 200 MG 12 hr tablet Take 1 tablet (200 mg total) by mouth daily. 30 tablet 4   Cholecalciferol (VITAMIN D3) 125 MCG (5000 UT) TABS Take 5,000 Units by mouth daily.     Coenzyme Q10 (CO Q 10 PO) Take 1 capsule by mouth daily.     FARXIGA 10 MG TABS tablet TAKE 1 TABLET(10 MG) BY MOUTH DAILY BEFORE BREAKFAST 90 tablet 2   glucose blood (ONETOUCH VERIO) test strip USE TO TEST BLOOD SUGAR TWICE DAILY 100 strip 3   glucose blood (ONETOUCH VERIO) test strip USE TO TEST BLOOD SUGAR TWICE DAILY 100  strip 3   Glycerin-Hypromellose-PEG 400 (DRY EYE RELIEF DROPS) 0.2-0.2-1 % SOLN Place 1 drop into both eyes daily as needed (Dry eye). homeopathic     hyoscyamine (LEVSIN SL) 0.125 MG SL tablet Place 1 tablet (0.125 mg total) under the tongue every 6 (six) hours as needed (spasms). 60 tablet 1   losartan-hydrochlorothiazide (HYZAAR) 100-12.5 MG tablet Take 1 tablet by mouth daily. 90 tablet 1   MAGNESIUM PO Take 2 tablets by mouth daily at 2 am.     metoprolol succinate (TOPROL-XL) 25 MG 24 hr tablet TAKE 1 TABLET(25 MG) BY MOUTH DAILY 90 tablet 1   Multiple Vitamins-Minerals (  MULTI COMPLETE) CAPS Take 1 capsule by mouth daily.     pantoprazole (PROTONIX) 40 MG tablet Take 1 tablet (40 mg total) by mouth daily. 90 tablet 3   VASCEPA 1 g capsule Take 2 capsules (2 g total) by mouth 2 (two) times daily. (Patient taking differently: Take 1 g by mouth 2 (two) times daily.) 360 capsule 3   Wheat Dextrin (BENEFIBER PO) Take by mouth. Takes one tablespoon tid     rosuvastatin (CRESTOR) 10 MG tablet Take 1 tablet (10 mg total) by mouth daily. 90 tablet 1   No facility-administered medications prior to visit.    Allergies  Allergen Reactions   Prozac [Fluoxetine Hcl] Anaphylaxis   Codeine Other (See Comments)    Intolerance    Erythromycin Other (See Comments)   Other     -mycin medications: has myasthenia gravis    Sulfa Antibiotics Hives    ROS Review of Systems  Constitutional:  Negative for chills and fever.  HENT:  Negative for congestion, sinus pressure, sinus pain and sore throat.   Eyes:  Negative for pain and discharge.  Respiratory:  Negative for cough and shortness of breath.   Cardiovascular:  Negative for chest pain and palpitations.  Gastrointestinal:  Negative for diarrhea, nausea and vomiting.  Endocrine: Negative for polydipsia and polyuria.  Genitourinary:  Negative for dysuria and hematuria.  Musculoskeletal:  Negative for neck pain and neck stiffness.  Skin:   Negative for rash.  Neurological:  Negative for dizziness and weakness.  Psychiatric/Behavioral:  Positive for sleep disturbance. Negative for agitation and behavioral problems. The patient is nervous/anxious.       Objective:    Physical Exam Vitals reviewed.  Constitutional:      General: She is not in acute distress.    Appearance: She is not diaphoretic.  HENT:     Head: Normocephalic and atraumatic.     Nose: Nose normal.     Mouth/Throat:     Mouth: Mucous membranes are moist.  Eyes:     General: No scleral icterus.    Extraocular Movements: Extraocular movements intact.  Cardiovascular:     Rate and Rhythm: Normal rate and regular rhythm.     Pulses: Normal pulses.     Heart sounds: Normal heart sounds. No murmur heard. Pulmonary:     Breath sounds: Normal breath sounds. No wheezing or rales.  Abdominal:     Palpations: Abdomen is soft.     Tenderness: There is no abdominal tenderness.  Musculoskeletal:     Cervical back: Neck supple. No tenderness.     Lumbar back: Tenderness present. Decreased range of motion.     Right lower leg: No edema.     Left lower leg: No edema.  Skin:    General: Skin is warm.     Findings: No rash.  Neurological:     General: No focal deficit present.     Mental Status: She is alert and oriented to person, place, and time.     Sensory: No sensory deficit.     Motor: No weakness.  Psychiatric:        Mood and Affect: Mood normal.        Behavior: Behavior is slowed. Behavior is cooperative.     BP 138/82 (BP Location: Right Arm)   Pulse 85   Ht 5\' 2"  (1.575 m)   Wt 180 lb 9.6 oz (81.9 kg)   SpO2 95%   BMI 33.03 kg/m  Wt Readings from Last  3 Encounters:  08/15/23 180 lb 9.6 oz (81.9 kg)  06/15/23 180 lb 12.8 oz (82 kg)  04/27/23 182 lb (82.6 kg)    Lab Results  Component Value Date   TSH 2.570 08/02/2022   Lab Results  Component Value Date   WBC 6.8 08/02/2022   HGB 16.6 (H) 08/02/2022   HCT 48.6 (H) 08/02/2022    MCV 94 08/02/2022   PLT 244 08/02/2022   Lab Results  Component Value Date   NA 137 03/20/2023   K 4.1 03/20/2023   CO2 27 03/20/2023   GLUCOSE 186 (H) 03/20/2023   BUN 15 03/20/2023   CREATININE 0.89 03/20/2023   BILITOT 1.3 (H) 03/20/2023   ALKPHOS 72 03/20/2023   AST 25 03/20/2023   ALT 33 03/20/2023   PROT 7.4 03/20/2023   ALBUMIN 4.5 03/20/2023   CALCIUM 9.5 03/20/2023   ANIONGAP 11 03/20/2023   EGFR 77 08/02/2022   Lab Results  Component Value Date   CHOL 164 03/20/2023   Lab Results  Component Value Date   HDL 44 03/20/2023   Lab Results  Component Value Date   LDLCALC 66 03/20/2023   Lab Results  Component Value Date   TRIG 269 (H) 03/20/2023   Lab Results  Component Value Date   CHOLHDL 3.7 03/20/2023   Lab Results  Component Value Date   HGBA1C 7.4 (H) 03/20/2023      Assessment & Plan:   Problem List Items Addressed This Visit       Cardiovascular and Mediastinum   Hypertension    BP Readings from Last 1 Encounters:  08/15/23 138/82   Usually well-controlled with  Losartan-HCTZ 100-12.5 mg QD and Metoprolol 25 mg QD Counseled for compliance with the medications Advised DASH diet and moderate exercise/walking, at least 150 mins/week      Relevant Medications   rosuvastatin (CRESTOR) 10 MG tablet   Other Relevant Orders   TSH   CMP14+EGFR   CBC with Differential/Platelet     Digestive   Hepatic steatosis    Noted on [place] of abdomen Liver enzymes have been WNL Continue to follow low-carb diet      Gastroesophageal reflux disease    On pantoprazole now Chest/epigastric pain could be due to GERD Avoid hot and spicy food        Endocrine   Diabetes mellitus, type II (HCC)    Lab Results  Component Value Date   HGBA1C 7.4 (H) 03/20/2023   Associated with HLD and HTN Uncontrolled, but she is trying to improve her diet -followed by nutritionist On Farxiga, did not tolerate Metformin Advised to follow diabetic diet On ARB and  statin      Relevant Medications   rosuvastatin (CRESTOR) 10 MG tablet   Other Relevant Orders   Hemoglobin A1c   CMP14+EGFR     Musculoskeletal and Integument   Osteoarthritis of hip    Has OA of hip and DDD of lumbar spine Checked x-ray of lumbar spine and pelvis Tylenol arthritis as needed for pain If persistent, offered Orthopedic surgery referral, she prefers to wait for now        Other   Depression, recurrent (HCC)    Flowsheet Row Office Visit from 08/15/2023 in [facility]  PHQ-9 Total Score 7      Well-controlled, on Wellbutrin 200 mg SR QD Had severe anxiety and insomnia with Zoloft 50 mg QD, discontinued Did not have benefit with Lexapro in the past Used to  be followed by psychiatry in [place]      Relevant Orders   TSH   Hyperlipidemia    Check lipid profile On Crestor and Vascepa      Relevant Medications   rosuvastatin (CRESTOR) 10 MG tablet   Other Relevant Orders   Lipid panel   Encounter for general adult medical examination with abnormal findings - Primary    Physical exam as documented. Fasting blood tests today. Flu vaccine today.      Other Visit Diagnoses     Vitamin D deficiency       Relevant Orders   VITAMIN D 25 Hydroxy (Vit-D Deficiency, Fractures)   Encounter for immunization       Relevant Orders   Flu Vaccine Trivalent High Dose (Fluad) (Completed)       Meds ordered this encounter  Medications   rosuvastatin (CRESTOR) 10 MG tablet    Sig: Take 1 tablet (10 mg total) by mouth daily.    Dispense:  90 tablet    Refill:  1    Follow-up: Return in about 4 months (around 12/16/2023) for DM and HTN.    Anabel Halon, MD

## 2023-08-15 NOTE — Assessment & Plan Note (Signed)
On pantoprazole now Chest/epigastric pain could be due to GERD Avoid hot and spicy food

## 2023-08-15 NOTE — Assessment & Plan Note (Signed)
Noted on [place] of abdomen Liver enzymes have been WNL Continue to follow low-carb diet

## 2023-08-15 NOTE — Assessment & Plan Note (Addendum)
Has OA of hip and DDD of lumbar spine Checked x-ray of lumbar spine and pelvis Tylenol arthritis as needed for pain If persistent, offered Orthopedic surgery referral, she prefers to wait for now

## 2023-08-15 NOTE — Patient Instructions (Addendum)
Please continue to take medications as prescribed.  Please continue to follow low carb diet and perform moderate exercise/walking at least 150 mins/week.  Please consider getting Tdap vaccine at local pharmacy.

## 2023-08-15 NOTE — Assessment & Plan Note (Signed)
Check lipid profile On Crestor and Vascepa

## 2023-08-16 NOTE — Progress Notes (Signed)
Name: Carolyn [name] DOB: October 22, 1951 MRN: [id number]  History of Present Illness: [name] is a 72 y.o. female who presents today for follow up visit at [facility] [place]. - GU history: 1. Voiding dysfunction with weak / intermittent urinary stream, frequency, sensation of incomplete bladder emptying. Possibly related to her "Cystocele that could be kinking the urethra", T2DM, myasthenia gravis. 2. Mixed urinary incontinence. 3. Cystocele. Did pelvic floor physical therapy previously. 4. Small rectocele. 5. Vaginal atrophy.  At last visit with Dr. Annabell [name] on 07/07/2022: - Reported improvement with positional voiding. - PVR = 2 ml.   Since last visit: > 03/20/2023: Normal renal function (creatinine 0.89, GFR >60).  Today: She reports persistent mixed urinary incontinence which is mildly bothersome. Wears 1-2 pads per day.   Reports terminal dribbling. She denies dysuria, gross hematuria, hesitancy, straining to void, or sensations of incomplete emptying.  She reports 1 suspected UTI over the past year for which her PCP prescribed antibiotics based on symptoms (no urine testing performed).   She reports mild increase in bother from vaginal bulge sensation compared to last year.   She is using a topical skin protectorant cream routinely due to vulvovaginal irritation related to urine leakage. Denies history of topical vaginal estrogen cream use.    Fall Screening: Do you usually have a device to assist in your mobility? No   Medications: Current Outpatient Medications  Medication Sig Dispense Refill   Blood Glucose Monitoring Suppl (BLOOD GLUCOSE SYSTEM PAK) KIT Use as directed to monitor FSBS 2x daily. Dx: E11.9 1 each 1   buPROPion (WELLBUTRIN SR) 200 MG 12 hr tablet Take 1 tablet (200 mg total) by mouth daily. 30 tablet 4   Cholecalciferol (VITAMIN D3) 125 MCG (5000 UT) TABS Take 5,000 Units by mouth daily.     Coenzyme Q10 (CO Q 10 PO) Take 1 capsule by mouth  daily.     FARXIGA 10 MG TABS tablet TAKE 1 TABLET(10 MG) BY MOUTH DAILY BEFORE BREAKFAST 90 tablet 2   glucose blood (ONETOUCH VERIO) test strip USE TO TEST BLOOD SUGAR TWICE DAILY 100 strip 3   glucose blood (ONETOUCH VERIO) test strip USE TO TEST BLOOD SUGAR TWICE DAILY 100 strip 3   Glycerin-Hypromellose-PEG 400 (DRY EYE RELIEF DROPS) 0.2-0.2-1 % SOLN Place 1 drop into both eyes daily as needed (Dry eye). homeopathic     hyoscyamine (LEVSIN SL) 0.125 MG SL tablet Place 1 tablet (0.125 mg total) under the tongue every 6 (six) hours as needed (spasms). 60 tablet 1   losartan-hydrochlorothiazide (HYZAAR) 100-12.5 MG tablet Take 1 tablet by mouth daily. 90 tablet 1   MAGNESIUM PO Take 2 tablets by mouth daily at 2 am.     metoprolol succinate (TOPROL-XL) 25 MG 24 hr tablet TAKE 1 TABLET(25 MG) BY MOUTH DAILY 90 tablet 1   Multiple Vitamins-Minerals (MULTI COMPLETE) CAPS Take 1 capsule by mouth daily.     pantoprazole (PROTONIX) 40 MG tablet Take 1 tablet (40 mg total) by mouth daily. 90 tablet 3   rosuvastatin (CRESTOR) 10 MG tablet Take 1 tablet (10 mg total) by mouth daily. 90 tablet 1   VASCEPA 1 g capsule Take 2 capsules (2 g total) by mouth 2 (two) times daily. (Patient taking differently: Take 1 g by mouth 2 (two) times daily.) 360 capsule 3   Wheat Dextrin (BENEFIBER PO) Take by mouth. Takes one tablespoon tid     No current facility-administered medications for this visit.  Allergies: Allergies  Allergen Reactions   Prozac [Fluoxetine Hcl] Anaphylaxis   Codeine Other (See Comments)    Intolerance    Erythromycin Other (See Comments)   Other     -mycin medications: has myasthenia gravis    Sulfa Antibiotics Hives    Past Medical History:  Diagnosis Date   Depression    Diabetes mellitus without complication (HCC)    Diastolic dysfunction    Hyperlipidemia    Myasthenia gravis (HCC)    Patient is Jehovah's Witness    Pulmonary embolism (HCC) 05/18/2021   Sleep apnea     Past Surgical History:  Procedure Laterality Date   APPENDECTOMY     BIOPSY  11/11/2020   Procedure: BIOPSY;  Surgeon: [name], MD;  Location: [facility];  Service: Gastroenterology;;  duodenum gastric   BUNIONECTOMY     COLONOSCOPY WITH PROPOFOL N/A 11/11/2020   Procedure: COLONOSCOPY WITH PROPOFOL;  Surgeon: [name], MD;  Location: [facility];  Service: Gastroenterology;  Laterality: N/A;  11:15   ESOPHAGEAL DILATION  03/23/2023   Procedure: ESOPHAGEAL DILATION;  Surgeon: Marguerita Merles, Reuel Boom, MD;  Location: [facility];  Service: Gastroenterology;;   ESOPHAGOGASTRODUODENOSCOPY (EGD) WITH PROPOFOL N/A 11/11/2020   Procedure: ESOPHAGOGASTRODUODENOSCOPY (EGD) WITH PROPOFOL;  Surgeon: [name], MD;  Location: [facility];  Service: Gastroenterology;  Laterality: N/A;  to arrive at 0845   ESOPHAGOGASTRODUODENOSCOPY (EGD) WITH PROPOFOL N/A 03/23/2023   Procedure: ESOPHAGOGASTRODUODENOSCOPY (EGD) WITH PROPOFOL;  Surgeon: [name], MD;  Location: [facility];  Service: Gastroenterology;  Laterality: N/A;  7:30am;asa 2   MANDIBLE SURGERY     TONSILECTOMY, ADENOIDECTOMY, BILATERAL MYRINGOTOMY AND TUBES     Family History  Problem Relation Age of Onset   Arthritis Mother    Depression Mother    Diabetes Mother    Hypertension Mother    Alcohol abuse Father    Hypertension Father    Heart disease Father 11       Massive MI   Diabetes Brother    Diabetes Brother    Heart disease Brother    Diabetes Brother        Type I    Social History   Socioeconomic History   Marital status: Married    Spouse name: Casimiro Needle   Number of children: 4   Years of education: some college   Highest education level: Some college, no degree  Occupational History   Occupation: Retired  Tobacco Use   Smoking status: Never    Passive exposure: Never   Smokeless tobacco: Never  Vaping Use   Vaping status: Never Used   Substance and Sexual Activity   Alcohol use: Yes    Comment: socially on rare occasions   Drug use: No   Sexual activity: Yes    Birth control/protection: Other-see comments    Comment: postmenopausal  Other Topics Concern   Not on file  Social History Narrative   Not on file   Social Determinants of Health   Financial Resource Strain: Low Risk  (06/19/2020)   Overall Financial Resource Strain (CARDIA)    Difficulty of Paying Living Expenses: Not very hard  Food Insecurity: No Food Insecurity (02/10/2021)   Hunger Vital Sign    Worried About Running Out of Food in the Last Year: Never true    Ran Out of Food in the Last Year: Never true  Transportation Needs: No Transportation Needs (02/10/2021)   PRAPARE - Transportation    Lack  of Transportation (Medical): No    Lack of Transportation (Non-Medical): No  Physical Activity: Insufficiently Active (02/10/2021)   Exercise Vital Sign    Days of Exercise per Week: 2 days    Minutes of Exercise per Session: 20 min  Stress: No Stress Concern Present (02/10/2021)   [employer] of Occupational Health - Occupational Stress Questionnaire    Feeling of Stress : Not at all  Social Connections: Moderately Integrated (02/10/2021)   Social Connection and Isolation Panel [NHANES]    Frequency of Communication with Friends and Family: More than three times a week    Frequency of Social Gatherings with Friends and Family: Three times a week    Attends Religious Services: More than 4 times per year    Active Member of Clubs or Organizations: No    Attends Banker Meetings: Never    Marital Status: Married  Catering manager Violence: Not At Risk (02/10/2021)   Humiliation, Afraid, Rape, and Kick questionnaire    Fear of Current or Ex-Partner: No    Emotionally Abused: No    Physically Abused: No    Sexually Abused: No   Review of Systems Constitutional: Patient denies any unintentional weight loss or change in  strength lntegumentary: Patient denies any rashes or pruritus Cardiovascular: Patient denies chest pain or syncope Respiratory: Patient denies shortness of breath Gastrointestinal: Patient denies nausea, vomiting, constipation, or diarrhea Musculoskeletal: Patient denies muscle cramps or weakness Neurologic: Patient denies convulsions or seizures Allergic/Immunologic: Patient denies recent allergic reaction(s) Hematologic/Lymphatic: Patient denies bleeding tendencies Endocrine: Patient denies heat/cold intolerance  GU: As per HPI.  OBJECTIVE Vitals:   08/24/23 1118  BP: 115/73  Pulse: 69  Temp: 98.3 F (36.8 C)   There is no height or weight on file to calculate BMI.  Physical Examination Constitutional: No obvious distress; patient is non-toxic appearing  Cardiovascular: No visible lower extremity edema.  Respiratory: The patient does not have audible wheezing/stridor; respirations do not appear labored  Gastrointestinal: Abdomen non-distended Musculoskeletal: Normal ROM of UEs  Skin: No obvious rashes/open sores  Neurologic: CN 2-12 grossly intact Psychiatric: Answered questions appropriately with normal affect  Hematologic/Lymphatic/Immunologic: No obvious bruises or sites of spontaneous bleeding  UA: negative  PVR: 1 ml  ASSESSMENT OAB (overactive bladder) - Plan: BLADDER SCAN AMB NON-IMAGING, Urinalysis, Routine w reflex microscopic  Mixed urge and stress incontinence - Plan: BLADDER SCAN AMB NON-IMAGING, Urinalysis, Routine w reflex microscopic  Cystocele, midline  Rectocele  Vaginal atrophy  OAB with urge incontinence and terminal dribbling.  - Neurogenic risk factors for OAB-type symptoms include T2DM and myasthenia gravis. - Likely exacerbated by diuretic use. - Advised double voiding.  Stress urinary incontinence. - Manageable / minimally bothersome per patient.  Cystocele / small rectocele.  We discussed the above diagnoses at length. A visual  demonstration of her vaginal prolapse was provided. We reviewed the non-surgical and surgical treatment options to include: - Pelvic floor PT: not significantly helpful previously - Pessary Surgical consultation After discussion, she decided to consider pessary or surgical consultation and is aware to notify provider if she elects to proceed with referral for either of those. Educational handouts provided.   Vaginal atrophy.  - Likely contributing to her vulvovaginal irritation.  - The etiology and consequences of urogenital epithelial atrophy was explained to patient. The thinning of the epithelium of the urethra can contribute to urinary urgency and frequency syndromes.  - In addition, the normal bacterial flora that colonizes the perineum may contribute to UTI  risk because the thin urethral epithelium allows the bacteria to become adherent and the change in vaginal pH can disrupt the vaginal / urethral microbiome and allow for bacterial overgrowth. - We discussed that there have been studies that evaluate use of low-dose intravaginal estrogen cream that shows minimal systemic absorption that is negligible after 3 weeks. There have been no studies indicating increased risk of contributing to breast cancer development or recurrence. - She decided to consider topical vaginal estrogen cream use for the future and is aware to notify provider if she elects to have that prescription sent.  For her reference, educational information also provided for UTI prevention.   Will plan for follow up in 1 year or sooner if needed. Pt verbalized understanding and agreement. All questions were answered.  PLAN Advised the following: 1. Return in about 1 year (around 08/23/2024) for UA, PVR, & f/u with Evette Georges NP.  Orders Placed This Encounter  Procedures   Urinalysis, Routine w reflex microscopic   BLADDER SCAN AMB NON-IMAGING   Total time spent caring for the patient today was over 30 minutes. This  includes time spent on the date of the visit reviewing the patient's chart before the visit, time spent during the visit, and time spent after the visit on documentation. Over 50% of that time was spent in face-to-face time with this patient for direct counseling. E&M based on time and complexity of medical decision making.  It has been explained that the patient is to follow regularly with their PCP in addition to all other providers involved in their care and to follow instructions provided by these respective offices. Patient advised to contact urology clinic if any urologic-pertaining questions, concerns, new symptoms or problems arise in the interim period.  Patient Instructions  AUGS: American Urogynec[employer] Society Patient Fact Sheets  AUGS  https://www.augs.org/patient-fact-sheets/         Recommendations regarding UTI prevention / management:  When UTI symptoms occur: Call urology office to request order for urine culture. We recommend waiting for urine culture result prior to use of any antibiotics.  For bladder pain/ burning with urination: Over the counter Pyridium (phenazopyridine) as needed (commonly known under the "AZO" brand). No more than 3 days consecutively at a time due to risk for methemoglobinemia, liver function issues, and bone health damage with long term use of Pyridium.  Routine use for UTI prevention: - Low dose antibiotic daily for UTI prophylaxis. - Topical vaginal estrogen for vaginal atrophy. Adequate fluid intake (>1.5 liters/day) to flush out the urinary tract. - Go to the bathroom to urinate every 4-6 hours while awake to minimize urinary stasis / bacterial overgrowth in the bladder. - Proanthocyanidin (PAC) supplement 36 mg daily; must be soluble (insoluble form of PAC will be ineffective). Recommended brand: Ellura. This is an over-the-counter supplement (often must be found/ purchased online) supplement derived from cranberries with concentrated  active component: Proanthocyanidin (PAC) 36 mg daily. Decreases bacterial adherence to bladder lining. Not recommended for patients with interstitial cystitis due to acidity. - D-mannose powder (2 grams daily). This is an over-the-counter supplement which decreases bacterial adherence to bladder lining (it is a sugar that inhibits bacterial adherence to urothelial cells by binding to the pili of enteric bacteria). Take as per manufacturer recommendation. Can be used as an alternative or in addition to the concentrated cranberry supplement. Not recommended for diabetic patients due to sugar content. - Vitamin C supplement to acidify urine to minimize bacterial growth. Not recommended for patients with  interstitial cystitis due to acidity. - Probiotic to maintain healthy vaginal microbiome to suppress bacteria at urethral opening. Brand recommendations: Darrold Junker (includes probiotic & D-mannose ), Feminine Balance (highest concentration of lactobacillus) or Hyperbiotic Pro 15.  Note for patients with diabetes: You may read about D-mannose powder for UTI prevention. That is an over the-counter supplement which decreases bacterial adherence to bladder lining. I would NOT advise that for you as a person with diabetes due to its sugar content.   Electronically signed by:  Donnita Falls, FNP   08/24/23    12:14 PM

## 2023-08-17 LAB — CBC WITH DIFFERENTIAL/PLATELET
Basophils Absolute: 0 10*3/uL (ref 0.0–0.2)
Basos: 0 %
EOS (ABSOLUTE): 0.1 10*3/uL (ref 0.0–0.4)
Eos: 1 %
Hematocrit: 48.7 % — ABNORMAL HIGH (ref 34.0–46.6)
Hemoglobin: 16.3 g/dL — ABNORMAL HIGH (ref 11.1–15.9)
Immature Grans (Abs): 0 10*3/uL (ref 0.0–0.1)
Immature Granulocytes: 0 %
Lymphocytes Absolute: 1.9 10*3/uL (ref 0.7–3.1)
Lymphs: 28 %
MCH: 31.9 pg (ref 26.6–33.0)
MCHC: 33.5 g/dL (ref 31.5–35.7)
MCV: 95 fL (ref 79–97)
Monocytes Absolute: 0.6 10*3/uL (ref 0.1–0.9)
Monocytes: 9 %
Neutrophils Absolute: 4.1 10*3/uL (ref 1.4–7.0)
Neutrophils: 62 %
Platelets: 225 10*3/uL (ref 150–450)
RBC: 5.11 x10E6/uL (ref 3.77–5.28)
RDW: 12.4 % (ref 11.7–15.4)
WBC: 6.7 10*3/uL (ref 3.4–10.8)

## 2023-08-17 LAB — HEMOGLOBIN A1C
Est. average glucose Bld gHb Est-mCnc: 171 mg/dL
Hgb A1c MFr Bld: 7.6 % — ABNORMAL HIGH (ref 4.8–5.6)

## 2023-08-17 LAB — LIPID PANEL
Chol/HDL Ratio: 3.2 {ratio} (ref 0.0–4.4)
Cholesterol, Total: 149 mg/dL (ref 100–199)
HDL: 46 mg/dL (ref >39)
LDL Chol Calc (NIH): 68 mg/dL (ref 0–99)
Triglycerides: 210 mg/dL — ABNORMAL HIGH (ref 0–149)
VLDL Cholesterol Cal: 35 mg/dL (ref 5–40)

## 2023-08-17 LAB — CMP14+EGFR
ALT: 20 [IU]/L (ref 0–32)
AST: 21 [IU]/L (ref 0–40)
Albumin: 5.1 g/dL — ABNORMAL HIGH (ref 3.8–4.8)
Alkaline Phosphatase: 89 [IU]/L (ref 44–121)
BUN/Creatinine Ratio: 14 (ref 12–28)
BUN: 13 mg/dL (ref 8–27)
Bilirubin Total: 1.1 mg/dL (ref 0.0–1.2)
CO2: 23 mmol/L (ref 20–29)
Calcium: 10 mg/dL (ref 8.7–10.3)
Chloride: 99 mmol/L (ref 96–106)
Creatinine, Ser: 0.91 mg/dL (ref 0.57–1.00)
Globulin, Total: 2.2 g/dL (ref 1.5–4.5)
Glucose: 107 mg/dL — ABNORMAL HIGH (ref 70–99)
Potassium: 3.9 mmol/L (ref 3.5–5.2)
Sodium: 142 mmol/L (ref 134–144)
Total Protein: 7.3 g/dL (ref 6.0–8.5)
eGFR: 67 mL/min/{1.73_m2} (ref >59)

## 2023-08-17 LAB — VITAMIN D 25 HYDROXY (VIT D DEFICIENCY, FRACTURES): Vit D, 25-Hydroxy: 71.8 ng/mL (ref 30.0–100.0)

## 2023-08-17 LAB — TSH: TSH: 2.46 u[IU]/mL (ref 0.450–4.500)

## 2023-08-24 ENCOUNTER — Encounter: Payer: Self-pay | Admitting: Urology

## 2023-08-24 ENCOUNTER — Ambulatory Visit: Payer: Medicare Other | Admitting: Urology

## 2023-08-24 VITALS — BP 115/73 | HR 69 | Temp 98.3°F

## 2023-08-24 DIAGNOSIS — N398 Other specified disorders of urinary system: Secondary | ICD-10-CM

## 2023-08-24 DIAGNOSIS — N3281 Overactive bladder: Secondary | ICD-10-CM | POA: Diagnosis not present

## 2023-08-24 DIAGNOSIS — N8111 Cystocele, midline: Secondary | ICD-10-CM | POA: Diagnosis not present

## 2023-08-24 DIAGNOSIS — N816 Rectocele: Secondary | ICD-10-CM | POA: Diagnosis not present

## 2023-08-24 DIAGNOSIS — N952 Postmenopausal atrophic vaginitis: Secondary | ICD-10-CM | POA: Insufficient documentation

## 2023-08-24 DIAGNOSIS — N3946 Mixed incontinence: Secondary | ICD-10-CM

## 2023-08-24 LAB — URINALYSIS, ROUTINE W REFLEX MICROSCOPIC
Bilirubin, UA: NEGATIVE
Ketones, UA: NEGATIVE
Leukocytes,UA: NEGATIVE
Nitrite, UA: NEGATIVE
Protein,UA: NEGATIVE
RBC, UA: NEGATIVE
Specific Gravity, UA: 1.01 (ref 1.005–1.030)
Urobilinogen, Ur: 0.2 mg/dL (ref 0.2–1.0)
pH, UA: 6 (ref 5.0–7.5)

## 2023-08-24 LAB — BLADDER SCAN AMB NON-IMAGING: Scan Result: 1

## 2023-08-24 NOTE — Progress Notes (Signed)
post void residual=1

## 2023-08-24 NOTE — Patient Instructions (Addendum)
AUGS: American Urogynecologic Society Patient Fact Sheets  AUGS  https://www.augs.org/patient-fact-sheets/         Recommendations regarding UTI prevention / management:  When UTI symptoms occur: Call urology office to request order for urine culture. We recommend waiting for urine culture result prior to use of any antibiotics.  For bladder pain/ burning with urination: Over the counter Pyridium (phenazopyridine) as needed (commonly known under the "AZO" brand). No more than 3 days consecutively at a time due to risk for methemoglobinemia, liver function issues, and bone health damage with long term use of Pyridium.  Routine use for UTI prevention: - Low dose antibiotic daily for UTI prophylaxis. - Topical vaginal estrogen for vaginal atrophy. Adequate fluid intake (>1.5 liters/day) to flush out the urinary tract. - Go to the bathroom to urinate every 4-6 hours while awake to minimize urinary stasis / bacterial overgrowth in the bladder. - Proanthocyanidin (PAC) supplement 36 mg daily; must be soluble (insoluble form of PAC will be ineffective). Recommended brand: Ellura. This is an over-the-counter supplement (often must be found/ purchased online) supplement derived from cranberries with concentrated active component: Proanthocyanidin (PAC) 36 mg daily. Decreases bacterial adherence to bladder lining. Not recommended for patients with interstitial cystitis due to acidity. - D-mannose powder (2 grams daily). This is an over-the-counter supplement which decreases bacterial adherence to bladder lining (it is a sugar that inhibits bacterial adherence to urothelial cells by binding to the pili of enteric bacteria). Take as per manufacturer recommendation. Can be used as an alternative or in addition to the concentrated cranberry supplement. Not recommended for diabetic patients due to sugar content. - Vitamin C supplement to acidify urine to minimize bacterial growth. Not recommended for  patients with interstitial cystitis due to acidity. - Probiotic to maintain healthy vaginal microbiome to suppress bacteria at urethral opening. Brand recommendations: Darrold Junker (includes probiotic & D-mannose ), Feminine Balance (highest concentration of lactobacillus) or Hyperbiotic Pro 15.  Note for patients with diabetes: You may read about D-mannose powder for UTI prevention. That is an over the-counter supplement which decreases bacterial adherence to bladder lining. I would NOT advise that for you as a person with diabetes due to its sugar content.

## 2023-08-28 ENCOUNTER — Ambulatory Visit: Payer: Medicare Other | Admitting: Nutrition

## 2023-09-06 ENCOUNTER — Other Ambulatory Visit: Payer: Self-pay | Admitting: Internal Medicine

## 2023-09-06 DIAGNOSIS — E1169 Type 2 diabetes mellitus with other specified complication: Secondary | ICD-10-CM

## 2023-09-06 MED ORDER — ONETOUCH VERIO VI STRP: ORAL_STRIP | 3 refills | Status: DC

## 2023-09-19 ENCOUNTER — Encounter: Payer: Medicare Other | Attending: Internal Medicine | Admitting: Nutrition

## 2023-09-19 ENCOUNTER — Encounter: Payer: Self-pay | Admitting: Nutrition

## 2023-09-19 DIAGNOSIS — Z713 Dietary counseling and surveillance: Secondary | ICD-10-CM | POA: Insufficient documentation

## 2023-09-19 DIAGNOSIS — E1169 Type 2 diabetes mellitus with other specified complication: Secondary | ICD-10-CM | POA: Diagnosis not present

## 2023-09-19 NOTE — Patient Instructions (Signed)
Goals  Find time for self care- bath, walk or reading Try to incorporate more fruits, vegetables and whole grains. Drink lemon water Get A1C 7% or less

## 2023-09-19 NOTE — Progress Notes (Signed)
Medical Nutrition Therapy  Appointment Start time:  18 Appointment End time:  107  Primary concerns today:  DM Type 2, Obesity and Hyperlipidemia, Fatty LIver  Referral diagnosis: E11.8, E66.9, E78.0, K76 Preferred learning sty no preference Learning readiness: ready    NUTRITION ASSESSMENT  71 yr odl wfemale referred for Type 2 DM referred from Dr. Allena Katz. Currently taking Fargixa.Has been having to care for her husband who has had mini strokes. Had to move recently.  Under a lot stress. Hasn't been able to cook and plan meals or exercise like she had in the past.  Willing to get back on track with meal planning and focus on more whole plant based foods Tends to snack some. Lab Results  Component Value Date   HGBA1C 7.6 (H) 08/16/2023    She Is willing to work with [facility] to improve her health, lower her BS and lose weight. Willing to participate in the 6 pillars of health to improve her health also.  Anthropometrics  Wt Readings from Last 3 Encounters:  08/15/23 180 lb 9.6 oz (81.9 kg)  06/15/23 180 lb 12.8 oz (82 kg)  04/12/23 184 lb 3.2 oz (83.6 kg)   Ht Readings from Last 3 Encounters:  08/15/23 5\' 2"  (1.575 m)  06/15/23 5\' 2"  (1.575 m)  04/12/23 5' 2.5" (1.588 m)   There is no height or weight on file to calculate BMI. @BMIFA @ Facility age limit for growth %iles is 20 years. Facility age limit for growth %iles is 20 years.    Clinical Medical Hx: . Past Medical History:  Diagnosis Date   Depression    Diabetes mellitus without complication (HCC)    Diastolic dysfunction    Hyperlipidemia    Myasthenia gravis (HCC)    Patient is Jehovah's Witness    Pulmonary embolism (HCC) 05/18/2021   Sleep apnea      Medications:  Current Outpatient Medications on File Prior to Visit  Medication Sig Dispense Refill   Blood Glucose Monitoring Suppl (BLOOD GLUCOSE SYSTEM PAK) KIT Use as directed to monitor FSBS 2x daily. Dx: E11.9 1 each 1   buPROPion  (WELLBUTRIN SR) 200 MG 12 hr tablet Take 1 tablet (200 mg total) by mouth daily. 30 tablet 4   Cholecalciferol (VITAMIN D3) 125 MCG (5000 UT) TABS Take 5,000 Units by mouth daily.     Coenzyme Q10 (CO Q 10 PO) Take 1 capsule by mouth daily.     FARXIGA 10 MG TABS tablet TAKE 1 TABLET(10 MG) BY MOUTH DAILY BEFORE BREAKFAST 90 tablet 2   glucose blood (ONETOUCH VERIO) test strip USE TO TEST BLOOD SUGAR TWICE DAILY 100 strip 3   Glycerin-Hypromellose-PEG 400 (DRY EYE RELIEF DROPS) 0.2-0.2-1 % SOLN Place 1 drop into both eyes daily as needed (Dry eye). homeopathic     hyoscyamine (LEVSIN SL) 0.125 MG SL tablet Place 1 tablet (0.125 mg total) under the tongue every 6 (six) hours as needed (spasms). 60 tablet 1   losartan-hydrochlorothiazide (HYZAAR) 100-12.5 MG tablet Take 1 tablet by mouth daily. 90 tablet 1   MAGNESIUM PO Take 2 tablets by mouth daily at 2 am.     metoprolol succinate (TOPROL-XL) 25 MG 24 hr tablet TAKE 1 TABLET(25 MG) BY MOUTH DAILY 90 tablet 1   Multiple Vitamins-Minerals (MULTI COMPLETE) CAPS Take 1 capsule by mouth daily.     pantoprazole (PROTONIX) 40 MG tablet Take 1 tablet (40 mg total) by mouth daily. 90 tablet 3   rosuvastatin (CRESTOR) 10 MG  tablet Take 1 tablet (10 mg total) by mouth daily. 90 tablet 1   VASCEPA 1 g capsule Take 2 capsules (2 g total) by mouth 2 (two) times daily. (Patient taking differently: Take 1 g by mouth 2 (two) times daily.) 360 capsule 3   Wheat Dextrin (BENEFIBER PO) Take by mouth. Takes one tablespoon tid     No current facility-administered medications on file prior to visit.    Labs:  Lab Results  Component Value Date   HGBA1C 7.6 (H) 08/16/2023      Latest Ref Rng & Units 08/16/2023    4:28 PM 03/20/2023    9:24 AM 08/02/2022    3:37 PM  CMP  Glucose 70 - 99 mg/dL 562  130  865   BUN 8 - 27 mg/dL 13  15  13    Creatinine 0.57 - 1.00 mg/dL 7.84  6.96  2.95   Sodium 134 - 144 mmol/L 142  137  144   Potassium 3.5 - 5.2 mmol/L 3.9  4.1   4.1   Chloride 96 - 106 mmol/L 99  99  99   CO2 20 - 29 mmol/L 23  27  25    Calcium 8.7 - 10.3 mg/dL 28.4  9.5  13.2   Total Protein 6.0 - 8.5 g/dL 7.3  7.4  7.4   Total Bilirubin 0.0 - 1.2 mg/dL 1.1  1.3  0.8   Alkaline Phos 44 - 121 IU/L 89  72  82   AST 0 - 40 IU/L 21  25  21    ALT 0 - 32 IU/L 20  33  25     Notable Signs/Symptoms:   Lifestyle & Dietary Hx Lives with her husband,  Estimated daily fluid intake: 30 oz Supplements:  Sleep: varies Stress / self-care:  Current average weekly physical activity: ADL  24-Hr Dietary Recall Eats 2 -3 meals per day   Estimated Energy Needs Calories: 1200 Carbohydrate: 135g Protein: 90g Fat: 33g   NUTRITION DIAGNOSIS  NB-1.1 Food and nutrition-related knowledge deficit As related to DIabetes and Obesity.  As evidenced by A1C 7.1% and BMI > 30.   NUTRITION INTERVENTION  Nutrition education (E-1) on the following topics:  Nutrition and Diabetes education provided on My Plate, CHO counting, meal planning, portion sizes, timing of meals, avoiding snacks between meals unless having a low blood sugar, target ranges for A1C and blood sugars, signs/symptoms and treatment of hyper/hypoglycemia, monitoring blood sugars, taking medications as prescribed, benefits of exercising 30 minutes per day and prevention of complications of DM.  Lifestyle Medicine  - Whole Food, Plant Predominant Nutrition is highly recommended: Eat Plenty of vegetables, Mushrooms, fruits, Legumes, Whole Grains, Nuts, seeds in lieu of processed meats, processed snacks/pastries red meat, poultry, eggs.    -It is better to avoid simple carbohydrates including: Cakes, Sweet Desserts, Ice Cream, Soda (diet and regular), Sweet Tea, Candies, Chips, Cookies, Store Bought Juices, Alcohol in Excess of  1-2 drinks a day, Lemonade,  Artificial Sweeteners, Doughnuts, Coffee Creamers, "Sugar-free" Products, etc, etc.  This is not a complete list.....  Exercise: If you are able:  30 -60 minutes a day ,4 days a week, or 150 minutes a week.  The longer the better.  Combine stretch, strength, and aerobic activities.  If you were told in the past that you have high risk for cardiovascular diseases, you may seek evaluation by your heart doctor prior to initiating moderate to intense exercise programs.   Handouts Provided Include  Know Your  Numbers Lifestyle Medicine  Learning Style & Readiness for Change Teaching method utilized: Visual & Auditory  Demonstrated degree of understanding via: Teach Back  Barriers to learning/adherence to lifestyle change: None  Goals Established by Pt Goals  Find time for self care- bath, walk or reading Try to incorporate more fruits, vegetables and whole grains. Drink lemon water Get A1C 7% or less   MONITORING & EVALUATION Dietary intake, weekly physical activity, and BS and weight in 1 month.  Next Steps  Patient is to work on meal planning and eating more foods from a garden.Marland Kitchen

## 2023-09-26 ENCOUNTER — Encounter: Payer: Self-pay | Admitting: Adult Health

## 2023-09-26 ENCOUNTER — Ambulatory Visit: Payer: Medicare Other | Admitting: Adult Health

## 2023-09-26 VITALS — BP 142/88 | HR 68 | Ht 62.0 in | Wt 180.0 lb

## 2023-09-26 DIAGNOSIS — G4733 Obstructive sleep apnea (adult) (pediatric): Secondary | ICD-10-CM | POA: Diagnosis not present

## 2023-09-26 NOTE — Progress Notes (Signed)
@Patient  ID: Carolyn [name], female    DOB: [birth year], 72 y.o.   MRN: [id number]  Chief Complaint  Patient presents with   Follow-up    Referring provider: Anabel Halon, MD  HPI: 72 yo female followed for OSA  History of PE 2022 , treated with AC x 6 months (managed by PCP )  TEST/EVENTS :  CT angio chest 05/18/21 >> small distal segmental and subsegmental PE in RLL   Sleep Tests:  HST 10/27/15 >> AHI 45 New CPAP 2023 Change from [facility] to [facility] 09/26/23   09/26/2023 Follow up : OSA  Patient complains for 1 year follow-up.  She has severe obstructive sleep apnea is on CPAP.  Says she is doing well on CPAP.  Feels that she is rested .  Feels that she benefits from CPAP with decreased daytime sleepiness.  Wears her CPAP every single night.  Uses nasal pillows.  Her CPAP is about 72 years old.  She would like to change from [facility] to [facility] for insurance reasons. CPAP download shows excellent compliance with 100% usage.  Daily average usage at 6 hours.  She is on auto CPAP 5 to 10 cm H2O.  Daily average pressure 9.8 cm H2O.  AHI 0.5/hour Patient remains active and fully independent.    Allergies  Allergen Reactions   Prozac [Fluoxetine Hcl] Anaphylaxis   Codeine Other (See Comments)    Intolerance    Erythromycin Other (See Comments)   Other     -mycin medications: has myasthenia gravis    Sulfa Antibiotics Hives    Immunization History  Administered Date(s) Administered   Fluad Quad(high Dose 65+) 09/15/2021, 08/02/2022   Fluad Trivalent(High Dose 65+) 08/15/2023   Moderna SARS-COV2 Booster Vaccination 11/14/2020   Moderna Sars-Covid-2 Vaccination 04/14/2020, 05/12/2020   PNEUMOCOCCAL CONJUGATE-20 09/27/2021   Zoster Recombinant(Shingrix) 07/01/2022, 09/14/2022    Past Medical History:  Diagnosis Date   Depression    Diabetes mellitus without complication (HCC)    Diastolic dysfunction    Hyperlipidemia    Myasthenia gravis  (HCC)    Patient is Jehovah's Witness    Pulmonary embolism (HCC) 05/18/2021   Sleep apnea     Tobacco History: Social History   Tobacco Use  Smoking Status Never   Passive exposure: Never  Smokeless Tobacco Never   Counseling given: Not Answered   Outpatient Medications Prior to Visit  Medication Sig Dispense Refill   Blood Glucose Monitoring Suppl (BLOOD GLUCOSE SYSTEM PAK) KIT Use as directed to monitor FSBS 2x daily. Dx: E11.9 1 each 1   buPROPion (WELLBUTRIN SR) 200 MG 12 hr tablet Take 1 tablet (200 mg total) by mouth daily. 30 tablet 4   Cholecalciferol (VITAMIN D3) 125 MCG (5000 UT) TABS Take 5,000 Units by mouth daily.     Coenzyme Q10 (CO Q 10 PO) Take 1 capsule by mouth daily.     FARXIGA 10 MG TABS tablet TAKE 1 TABLET(10 MG) BY MOUTH DAILY BEFORE BREAKFAST 90 tablet 2   glucose blood (ONETOUCH VERIO) test strip USE TO TEST BLOOD SUGAR TWICE DAILY 100 strip 3   Glycerin-Hypromellose-PEG 400 (DRY EYE RELIEF DROPS) 0.2-0.2-1 % SOLN Place 1 drop into both eyes daily as needed (Dry eye). homeopathic     hyoscyamine (LEVSIN SL) 0.125 MG SL tablet Place 1 tablet (0.125 mg total) under the tongue every 6 (six) hours as needed (spasms). 60 tablet 1   losartan-hydrochlorothiazide (HYZAAR) 100-12.5 MG tablet Take 1 tablet by mouth  daily. 90 tablet 1   MAGNESIUM PO Take 2 tablets by mouth daily at 2 am.     metoprolol succinate (TOPROL-XL) 25 MG 24 hr tablet TAKE 1 TABLET(25 MG) BY MOUTH DAILY 90 tablet 1   Multiple Vitamins-Minerals (MULTI COMPLETE) CAPS Take 1 capsule by mouth daily.     pantoprazole (PROTONIX) 40 MG tablet Take 1 tablet (40 mg total) by mouth daily. 90 tablet 3   rosuvastatin (CRESTOR) 10 MG tablet Take 1 tablet (10 mg total) by mouth daily. 90 tablet 1   VASCEPA 1 g capsule Take 2 capsules (2 g total) by mouth 2 (two) times daily. (Patient taking differently: Take 1 g by mouth 2 (two) times daily.) 360 capsule 3   Wheat Dextrin (BENEFIBER PO) Take by mouth.  Takes one tablespoon tid     No facility-administered medications prior to visit.     Review of Systems:   Constitutional:   No  weight loss, night sweats,  Fevers, chills, fatigue, or  lassitude.  HEENT:   No headaches,  Difficulty swallowing,  Tooth/dental problems, or  Sore throat,                No sneezing, itching, ear ache, nasal congestion, post nasal drip,   CV:  No chest pain,  Orthopnea, PND, swelling in lower extremities, anasarca, dizziness, palpitations, syncope.   GI  No heartburn, indigestion, abdominal pain, nausea, vomiting, diarrhea, change in bowel habits, loss of appetite, bloody stools.   Resp: No shortness of breath with exertion or at rest.  No excess mucus, no productive cough,  No non-productive cough,  No coughing up of blood.  No change in color of mucus.  No wheezing.  No chest wall deformity  Skin: no rash or lesions.  GU: no dysuria, change in color of urine, no urgency or frequency.  No flank pain, no hematuria   MS:  No joint pain or swelling.  No decreased range of motion.  No back pain.    Physical Exam  BP (!) 142/88   Pulse 68   Ht 5\' 2"  (1.575 m)   Wt 180 lb (81.6 kg)   SpO2 98%   BMI 32.92 kg/m   GEN: A/Ox3; pleasant , NAD, well nourished    HEENT:  [place]/AT,   NOSE-clear, THROAT-clear, no lesions, no postnasal drip or exudate noted.  Class II-III MP airway  NECK:  Supple w/ fair ROM; no JVD; normal carotid impulses w/o bruits; no thyromegaly or nodules palpated; no lymphadenopathy.    RESP  Clear  P & A; w/o, wheezes/ rales/ or rhonchi. no accessory muscle use, no dullness to percussion  CARD:  RRR, no m/r/g, no peripheral edema, pulses intact, no cyanosis or clubbing.  GI:   Soft & nt; nml bowel sounds; no organomegaly or masses detected.   Musco: Warm bil, no deformities or joint swelling noted.   Neuro: alert, no focal deficits noted.    Skin: Warm, no lesions or rashes    Lab Results:  CBC   BMET   BNP No  results found for: "BNP"  ProBNP No results found for: "PROBNP"  Imaging: No results found.  Administration History     None           No data to display          No results found for: "NITRICOXIDE"      Assessment & Plan:   Sleep apnea Excellent control and compliance on CPAP.  Current settings  Patient education given on CPAP care. Change from [facility] to [facility]  Plan  Patient Instructions  Keep up good work  Continue on CPAP At bedtime .  Work on healthy weight  Do not drive if sleepy  Follow up in 1 year and As needed        Marathon [employer], NP 09/26/2023

## 2023-09-26 NOTE — Addendum Note (Signed)
Addended by: Judd Gaudier on: 09/26/2023 10:17 AM   Modules accepted: Orders

## 2023-09-26 NOTE — Assessment & Plan Note (Signed)
Excellent control and compliance on CPAP.  Current settings Patient education given on CPAP care. Change from [facility] to [facility]  Plan  Patient Instructions  Keep up good work  Continue on CPAP At bedtime .  Work on healthy weight  Do not drive if sleepy  Follow up in 1 year and As needed

## 2023-09-26 NOTE — Patient Instructions (Signed)
Keep up good work  Continue on CPAP At bedtime .  Work on healthy weight  Do not drive if sleepy  Follow up in 1 year and As needed

## 2023-10-03 LAB — HM DIABETES EYE EXAM

## 2023-10-06 ENCOUNTER — Other Ambulatory Visit: Payer: Self-pay | Admitting: Internal Medicine

## 2023-10-06 ENCOUNTER — Encounter: Payer: Self-pay | Admitting: Internal Medicine

## 2023-10-06 DIAGNOSIS — M160 Bilateral primary osteoarthritis of hip: Secondary | ICD-10-CM

## 2023-10-16 ENCOUNTER — Telehealth: Payer: Self-pay | Admitting: Internal Medicine

## 2023-10-16 ENCOUNTER — Telehealth: Payer: Self-pay | Admitting: Adult Health

## 2023-10-16 NOTE — Telephone Encounter (Signed)
Spoke with some one at [facility]  she said that she did see that that order had been received and that she will have Brad to give [place] call back regarding this pt order.

## 2023-10-16 NOTE — Telephone Encounter (Signed)
Spoke with patient and informed her [facility] will be contacting her about shipping her supplies ASAP.  Patient voiced her understanding.

## 2023-10-16 NOTE — Telephone Encounter (Signed)
Patient states her "information" has not been sent to [facility] so she can receive her CPAP supplies---can we take care of this for her?  Patient call back [phone]

## 2023-10-16 NOTE — Telephone Encounter (Signed)
Called and had to lvm with [facility] regarding this patient , something was going with their phone line.

## 2023-10-16 NOTE — Telephone Encounter (Signed)
Patient has a sinus headache and would like to be seen by dr patel sooner then what he has available

## 2023-10-17 NOTE — Patient Instructions (Signed)

## 2023-10-17 NOTE — Progress Notes (Unsigned)
   Established Patient Office Visit   Subjective  Patient ID: Carolyn [name], female    DOB: 1951-02-26  Age: 72 y.o. MRN: [id number]  No chief complaint on file.   She  has a past medical history of Depression, Diabetes mellitus without complication (HCC), Diastolic dysfunction, Hyperlipidemia, Myasthenia gravis (HCC), Patient is Jehovah's Witness, Pulmonary embolism (HCC) (05/18/2021), and Sleep apnea.  Patient complains of {pneum rfv:14244}. Patient describes symptoms of {pneumonia symptoms:12520}. Symptoms began {0-10:33138} {unit:11::"days"} ago and are {course:17::"unchanged"} since that time. Patient denies {pneumonia denies:14121}. Treatment thus far includes {pneumonia tx to date:14122} Past pulmonary history is significant for {resp history:412}     ROS    Objective:     There were no vitals taken for this visit. {Vitals History (Optional):23777}  Physical Exam   No results found for any visits on 10/18/23.  The 10-year ASCVD risk score (Arnett DK, et al., 2019) is: 31.9%    Assessment & Plan:  There are no diagnoses linked to this encounter.  No follow-ups on file.   Cruzita Lederer Newman Nip, FNP

## 2023-10-18 ENCOUNTER — Ambulatory Visit (INDEPENDENT_AMBULATORY_CARE_PROVIDER_SITE_OTHER): Payer: Medicare Other | Admitting: Family Medicine

## 2023-10-18 ENCOUNTER — Encounter: Payer: Self-pay | Admitting: Family Medicine

## 2023-10-18 VITALS — BP 135/84 | HR 85 | Ht 62.0 in | Wt 178.1 lb

## 2023-10-18 DIAGNOSIS — J069 Acute upper respiratory infection, unspecified: Secondary | ICD-10-CM

## 2023-10-18 MED ORDER — AMOXICILLIN-POT CLAVULANATE 875-125 MG PO TABS: 1.0000 | ORAL_TABLET | Freq: Two times a day (BID) | ORAL | 0 refills | Status: AC

## 2023-10-18 MED ORDER — BENZONATATE 200 MG PO CAPS: 200.0000 mg | ORAL_CAPSULE | Freq: Two times a day (BID) | ORAL | 0 refills | Status: DC | PRN

## 2023-10-18 MED ORDER — SALINE SPRAY 0.65 % NA SOLN: 1.0000 | NASAL | 0 refills | Status: DC | PRN

## 2023-10-18 NOTE — Assessment & Plan Note (Signed)
Benzonatate 200 mg PRN,  Augmentin 875-125 mg twice daily x 7 days Advise patient to rest to support your body's recovery. Stay hydrated by drinking water, tea, or broth. Using a humidifier can help soothe throat irritation and ease nasal congestion. For fever or pain, acetaminophen (Tylenol) is recommended. To relieve other symptoms, try saline nasal sprays, throat lozenges, or gargling with saltwater. Focus on eating light, healthy meals like fruits and vegetables to keep your strength up. Practice good hygiene by washing your hands frequently and covering your mouth when coughing or sneezing.Follow-up for worsening or persistent symptoms. Patient verbalizes understanding regarding plan of care and all questions answered

## 2023-10-26 DIAGNOSIS — K1120 Sialoadenitis, unspecified: Secondary | ICD-10-CM | POA: Insufficient documentation

## 2023-10-26 DIAGNOSIS — R0982 Postnasal drip: Secondary | ICD-10-CM | POA: Insufficient documentation

## 2023-10-26 DIAGNOSIS — R5383 Other fatigue: Secondary | ICD-10-CM | POA: Insufficient documentation

## 2023-10-26 DIAGNOSIS — M25519 Pain in unspecified shoulder: Secondary | ICD-10-CM | POA: Insufficient documentation

## 2023-10-26 DIAGNOSIS — J018 Other acute sinusitis: Secondary | ICD-10-CM | POA: Insufficient documentation

## 2023-10-26 DIAGNOSIS — M25511 Pain in right shoulder: Secondary | ICD-10-CM | POA: Insufficient documentation

## 2023-10-26 DIAGNOSIS — F419 Anxiety disorder, unspecified: Secondary | ICD-10-CM | POA: Insufficient documentation

## 2023-10-30 ENCOUNTER — Ambulatory Visit: Payer: Medicare Other | Admitting: Orthopedic Surgery

## 2023-10-30 ENCOUNTER — Encounter: Payer: Self-pay | Admitting: Orthopedic Surgery

## 2023-10-30 ENCOUNTER — Other Ambulatory Visit: Payer: Self-pay | Admitting: Internal Medicine

## 2023-10-30 VITALS — Ht 62.0 in | Wt 181.0 lb

## 2023-10-30 DIAGNOSIS — M5136 Other intervertebral disc degeneration, lumbar region with discogenic back pain only: Secondary | ICD-10-CM

## 2023-10-30 DIAGNOSIS — M461 Sacroiliitis, not elsewhere classified: Secondary | ICD-10-CM | POA: Diagnosis not present

## 2023-10-30 DIAGNOSIS — G8929 Other chronic pain: Secondary | ICD-10-CM

## 2023-10-30 MED ORDER — MELOXICAM 7.5 MG PO TABS: 7.5000 mg | ORAL_TABLET | Freq: Every day | ORAL | 5 refills | Status: DC

## 2023-10-30 NOTE — Addendum Note (Signed)
Addended by: Vickki Hearing on: 10/30/2023 02:20 PM   Modules accepted: Orders

## 2023-10-30 NOTE — Progress Notes (Signed)
Chief Complaint  Patient presents with   Shoulder Pain    Right shoulder pain not as bad but is painful can not lift or reach across the body    Hip Pain    Bil hip pain  pain in the left joint and groin only the right hip radiates to groin and down the leg    72 year old female presents for evaluation of back and hip pain complaining of pain in both sacroiliac joints  Pain does radiate around into the front  No treatment to date   Review of Systems  Constitutional:  Negative for chills, fever, malaise/fatigue and weight loss.  Gastrointestinal:  Negative for constipation.       Denies loss bowel control   Genitourinary:        Denies urinary retention or los of bladder control    Ht 5\' 2"  (1.575 m)   Wt 181 lb (82.1 kg)   BMI 33.11 kg/m   Physical Exam Vitals and nursing note reviewed.  Constitutional:      Appearance: Normal appearance.  HENT:     Head: Normocephalic and atraumatic.  Eyes:     General: No scleral icterus.       Right eye: No discharge.        Left eye: No discharge.     Extraocular Movements: Extraocular movements intact.     Conjunctiva/sclera: Conjunctivae normal.     Pupils: Pupils are equal, round, and reactive to light.  Cardiovascular:     Rate and Rhythm: Normal rate.     Pulses: Normal pulses.  Skin:    General: Skin is warm and dry.     Capillary Refill: Capillary refill takes less than 2 seconds.  Neurological:     General: No focal deficit present.     Mental Status: She is alert and oriented to person, place, and time.  Psychiatric:        Mood and Affect: Mood normal.        Behavior: Behavior normal.        Thought Content: Thought content normal.        Judgment: Judgment normal.    Hip examination normal range of motion without pain no leg length discrepancies.  Lumbar spine examination shows tenderness over both SI joints and also in the right and left posterior quadratus lumborum area  Reflexes were normal.  Strength was  normal in both legs.  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   Degeneration of intervertebral disc of lumbar region with discogenic back pain Yes   SI joint arthritis (HCC)     Meds ordered this encounter  Medications   meloxicam (MOBIC) 7.5 MG tablet    Sig: Take 1 tablet (7.5 mg total) by mouth daily.    Dispense:  30 tablet    Refill:  5   Return as needed for back and hips but new referral requested for shoulder

## 2023-10-30 NOTE — Addendum Note (Signed)
Addended by: Jodene Nam A on: 10/30/2023 02:27 PM   Modules accepted: Orders

## 2023-10-31 ENCOUNTER — Telehealth: Payer: Self-pay | Admitting: Orthopedic Surgery

## 2023-10-31 DIAGNOSIS — M5136 Other intervertebral disc degeneration, lumbar region with discogenic back pain only: Secondary | ICD-10-CM

## 2023-10-31 DIAGNOSIS — M461 Sacroiliitis, not elsewhere classified: Secondary | ICD-10-CM

## 2023-10-31 MED ORDER — MELOXICAM 7.5 MG PO TABS: 7.5000 mg | ORAL_TABLET | Freq: Every day | ORAL | 5 refills | Status: DC

## 2023-10-31 NOTE — Telephone Encounter (Signed)
 Patient states yesterday the Rx for Meloxicam  was supposed to go to [facility] but was sent to [facility], she states she told the assistant twice but pharmacy was not changed, I have resent it to [facility] pharmacy in [place]   She also wanted Physical therapy in [place] I sent order and advised patient if she doesn't hear back by Friday to let me know.

## 2023-11-02 ENCOUNTER — Telehealth: Payer: Medicare Other | Admitting: Family Medicine

## 2023-11-02 DIAGNOSIS — J069 Acute upper respiratory infection, unspecified: Secondary | ICD-10-CM | POA: Diagnosis not present

## 2023-11-02 MED ORDER — CEFDINIR 300 MG PO CAPS: 300.0000 mg | ORAL_CAPSULE | Freq: Two times a day (BID) | ORAL | 0 refills | Status: AC

## 2023-11-02 MED ORDER — LEVOCETIRIZINE DIHYDROCHLORIDE 5 MG PO TABS: 5.0000 mg | ORAL_TABLET | Freq: Every evening | ORAL | 1 refills | Status: DC

## 2023-11-02 NOTE — Progress Notes (Signed)
 Virtual Visit via Video Note  I connected with Carolyn [name] on 11/02/23 at  2:20 PM EST by a video enabled telemedicine application and verified that I am speaking with the correct person using two identifiers.  Patient Location: Home Provider Location: Office/Clinic  I discussed the limitations, risks, security, and privacy concerns of performing an evaluation and management service by video and the availability of in person appointments. I also discussed with the patient that there may be a patient responsible charge related to this service. The patient expressed understanding and agreed to proceed.  Subjective: PCP: Tobie Suzzane POUR, MD  Chief Complaint  Patient presents with   Cough   Headache    sinus   yellow phelgm   Nasal Congestion    Pt stats they have no fever.   Patient complains of persistent cough. Patient describes symptoms of cough, fatigue, headache, malaise, myalgias, sputum production, and wheezing. Symptoms began several weeks ago and are gradually worsening since that time. Patient denies dyspnea or nausea and vomiting. Treatment thus far includes  amoxicillin  a few weeks ago analgesics/antipyretics: somewhat effective and anti-tussive: somewhat effective Past pulmonary history is significant for bronchitis        ROS: Per HPI  Current Outpatient Medications:    cefdinir  (OMNICEF ) 300 MG capsule, Take 1 capsule (300 mg total) by mouth 2 (two) times daily for 5 days., Disp: 10 capsule, Rfl: 0   levocetirizine (XYZAL ) 5 MG tablet, Take 1 tablet (5 mg total) by mouth every evening., Disp: 30 tablet, Rfl: 1   Blood Glucose Monitoring Suppl (BLOOD GLUCOSE SYSTEM PAK) KIT, Use as directed to monitor FSBS 2x daily. Dx: E11.9, Disp: 1 each, Rfl: 1   buPROPion  (WELLBUTRIN  SR) 200 MG 12 hr tablet, Take 1 tablet (200 mg total) by mouth daily., Disp: 30 tablet, Rfl: 4   Cholecalciferol (VITAMIN D3) 125 MCG (5000 UT) TABS, Take 5,000 Units by mouth daily., Disp: ,  Rfl:    Coenzyme Q10 (CO Q 10 PO), Take 1 capsule by mouth daily., Disp: , Rfl:    FARXIGA  10 MG TABS tablet, TAKE 1 TABLET(10 MG) BY MOUTH DAILY BEFORE BREAKFAST, Disp: 90 tablet, Rfl: 2   glucose blood (ONETOUCH VERIO) test strip, USE TO TEST BLOOD SUGAR TWICE DAILY, Disp: 100 strip, Rfl: 3   Glycerin-Hypromellose-PEG 400 (DRY EYE RELIEF DROPS) 0.2-0.2-1 % SOLN, Place 1 drop into both eyes daily as needed (Dry eye). homeopathic, Disp: , Rfl:    hyoscyamine  (LEVSIN  SL) 0.125 MG SL tablet, Place 1 tablet (0.125 mg total) under the tongue every 6 (six) hours as needed (spasms)., Disp: 60 tablet, Rfl: 1   losartan -hydrochlorothiazide (HYZAAR) 100-12.5 MG tablet, Take 1 tablet by mouth daily., Disp: 90 tablet, Rfl: 1   MAGNESIUM PO, Take 2 tablets by mouth daily at 2 am., Disp: , Rfl:    meloxicam  (MOBIC ) 7.5 MG tablet, Take 1 tablet (7.5 mg total) by mouth daily., Disp: 30 tablet, Rfl: 5   metoprolol  succinate (TOPROL -XL) 25 MG 24 hr tablet, TAKE 1 TABLET(25 MG) BY MOUTH DAILY, Disp: 90 tablet, Rfl: 1   Multiple Vitamins-Minerals (MULTI COMPLETE) CAPS, Take 1 capsule by mouth daily., Disp: , Rfl:    pantoprazole  (PROTONIX ) 40 MG tablet, Take 1 tablet (40 mg total) by mouth daily., Disp: 90 tablet, Rfl: 3   rosuvastatin  (CRESTOR ) 10 MG tablet, Take 1 tablet (10 mg total) by mouth daily., Disp: 90 tablet, Rfl: 1   sodium chloride (OCEAN) 0.65 % SOLN nasal spray,  Place 1 spray into both nostrils as needed for congestion., Disp: 50 mL, Rfl: 0   VASCEPA  1 g capsule, Take 2 capsules (2 g total) by mouth 2 (two) times daily. (Patient taking differently: Take 1 g by mouth 2 (two) times daily.), Disp: 360 capsule, Rfl: 3   Wheat Dextrin (BENEFIBER PO), Take by mouth. Takes one tablespoon tid, Disp: , Rfl:   Observations/Objective: There were no vitals filed for this visit. Physical Exam Patient is alert and no acute distress noted.   Assessment and Plan: Upper respiratory tract infection, unspecified  type Assessment & Plan: Patient symptoms worsening since last visit, failed on Augmentin  875-125 mg twice daily x 7 days  Chest Xray ordered Trial on Cefdinir  300 mg twice daily x 5 days, Xyzal  5 mg PRN  Orders: -     DG Chest 2 View; Future -     Cefdinir ; Take 1 capsule (300 mg total) by mouth 2 (two) times daily for 5 days.  Dispense: 10 capsule; Refill: 0  Other orders -     Levocetirizine Dihydrochloride ; Take 1 tablet (5 mg total) by mouth every evening.  Dispense: 30 tablet; Refill: 1    Follow Up Instructions: No follow-ups on file.   I discussed the assessment and treatment plan with the patient. The patient was provided an opportunity to ask questions, and all were answered. The patient agreed with the plan and demonstrated an understanding of the instructions.   The patient was advised to call back or seek an in-person evaluation if the symptoms worsen or if the condition fails to improve as anticipated.  The above assessment and management plan was discussed with the patient. The patient verbalized understanding of and has agreed to the management plan.   Hilario Kidd Wilhelmena Falter, FNP

## 2023-11-02 NOTE — Assessment & Plan Note (Signed)
 Patient symptoms worsening since last visit, failed on Augmentin 875-125 mg twice daily x 7 days  Chest Xray ordered Trial on Cefdinir 300 mg twice daily x 5 days, Xyzal 5 mg PRN

## 2023-11-08 NOTE — Progress Notes (Signed)
  Intake history:  BP 125/85   Pulse 86   Ht 5' 2 (1.575 m)   Wt 180 lb (81.6 kg)   BMI 32.92 kg/m  Body mass index is 32.92 kg/m.    WHAT ARE WE SEEING YOU FOR TODAY?  Right shoulder and neck   How long has this bothered you?   Couple months  Anticoag.  No  Diabetes Yes  Heart disease No  Hypertension Yes  SMOKING HX No  Kidney disease No  Any ALLERGIES _____ Allergies  Allergen Reactions   Prednisolone Acetate-Nepafenac Other (See Comments)    1% eye solution Causes eye pain and severe headaches   Prozac [Fluoxetine Hcl] Anaphylaxis   Codeine Other (See Comments) and Nausea And Vomiting    Intolerance   Erythromycin Other (See Comments)   Fluoxetine Hives   Other     -mycin medications: has myasthenia gravis    Sulfa Antibiotics Hives   _________________________________________   Treatment:  Have you taken:  Tylenol  No  Advil No  Had PT No  Had injection No  Other  ____has tried muscle rubs not helpful pain worse at night _____________________

## 2023-11-09 ENCOUNTER — Other Ambulatory Visit (INDEPENDENT_AMBULATORY_CARE_PROVIDER_SITE_OTHER): Payer: Medicare Other

## 2023-11-09 ENCOUNTER — Ambulatory Visit: Payer: Medicare Other | Admitting: Orthopedic Surgery

## 2023-11-09 ENCOUNTER — Encounter: Payer: Self-pay | Admitting: Orthopedic Surgery

## 2023-11-09 VITALS — BP 125/85 | HR 86 | Ht 62.0 in | Wt 180.0 lb

## 2023-11-09 DIAGNOSIS — M25511 Pain in right shoulder: Secondary | ICD-10-CM

## 2023-11-09 DIAGNOSIS — M542 Cervicalgia: Secondary | ICD-10-CM

## 2023-11-09 MED ORDER — METHYLPREDNISOLONE ACETATE 40 MG/ML IJ SUSP
40.0000 mg | Freq: Once | INTRAMUSCULAR | Status: AC
  Administered 2023-11-09: 40 mg via INTRA_ARTICULAR

## 2023-11-09 MED ORDER — NAPROXEN 500 MG PO TABS: 500.0000 mg | ORAL_TABLET | Freq: Two times a day (BID) | ORAL | 2 refills | Status: DC

## 2023-11-09 NOTE — Progress Notes (Signed)
 Subjective:     Patient ID: Carolyn [name], female   DOB: 27-Nov-1950, 73 y.o.   MRN: [id number]  Patient presents with: Shoulder Pain: Right for a couple months no injury painful ROM and pain at night  Neck Pain: Worse at night    73 year old female pain right shoulder no history of trauma.  Pain goes up into the neck and extends down at the right upper humerus.  This is causing some night pain and some discomfort when she lifts the arm above her head of her extends the shoulder joint   Intake history:   BP 125/85   Pulse 86   Ht 5' 2 (1.575 m)   Wt 180 lb (81.6 kg)   BMI 32.92 kg/m  Body mass index is 32.92 kg/m.       WHAT ARE WE SEEING YOU FOR TODAY?  Right shoulder and neck    How long has this bothered you?    Couple months   Anticoag.  No   Diabetes Yes   Heart disease No   Hypertension Yes   SMOKING HX No   Kidney disease No   Any ALLERGIES _____ Allergies Allergies Allergen Reactions  Prednisolone Acetate-Nepafenac Other (See Comments)     1% eye solution Causes eye pain and severe headaches  Prozac [Fluoxetine Hcl] Anaphylaxis  Codeine Other (See Comments) and Nausea And Vomiting     Intolerance  Erythromycin Other (See Comments)  Fluoxetine Hives  Other       -mycin medications: has myasthenia gravis    Sulfa Antibiotics Hives    _________________________________________     Treatment:  Have you taken:  Tylenol  No  Advil No  Had PT No  Had injection No  Other  ____has tried muscle rubs not helpful pain worse at night _____________________            Review of Systems-noncontributory     Objective:   Physical Exam Vitals and nursing note reviewed.  Constitutional:      Appearance: Normal appearance.  HENT:     Head: Normocephalic and atraumatic.  Eyes:     General: No scleral icterus.       Right eye: No discharge.        Left eye: No discharge.     Extraocular Movements: Extraocular movements intact.      Conjunctiva/sclera: Conjunctivae normal.     Pupils: Pupils are equal, round, and reactive to light.  Cardiovascular:     Rate and Rhythm: Normal rate.     Pulses: Normal pulses.  Musculoskeletal:     Comments: Her C-spine range of motion is very good her neck is tender over the midline at the base and also on the right side and the muscle  She has no weakness but pain against resistance with abduction and empty can position mild impingement also noted  Skin:    General: Skin is warm and dry.     Capillary Refill: Capillary refill takes less than 2 seconds.  Neurological:     General: No focal deficit present.     Mental Status: She is alert and oriented to person, place, and time.  Psychiatric:        Mood and Affect: Mood normal.        Behavior: Behavior normal.        Thought Content: Thought content normal.        Judgment: Judgment normal.        Assessment:     DG  Shoulder Right Result Date: 11/09/2023 Right shoulder pain Some pain at night normal glenohumeral joint normal humeral shaft nothing on the right lateral chest wall of any concern chest looks clear AC joint shows normal joint space with small osteophyte superiorly mild AC arthritis Impression normal glenohumeral joint   DG Cervical Spine 2 or 3 views Result Date: 11/09/2023 Right shoulder pain neck pain X-ray C-spine 2 views AP and lateral.  Degenerative changes are seen in the mid cervical spine with uncovertebral joint arthrosis slight loss of cervical lordosis anterior lipping of the vertebrae starting at C5 extending to C6 mild joint space narrowing in these areas consistent with Impression cervical spondylosis       Plan:     Recommend subacromial injection  Anti-inflammatories  Home exercise program  Return if no improvement   Procedure note the subacromial injection shoulder RIGHT    Verbal consent was obtained to inject the  RIGHT   Shoulder  Timeout was completed to confirm the injection site  is a subacromial space of the  RIGHT  shoulder   Medication used Depo-Medrol  40 mg and lidocaine  1% 3 cc  Anesthesia was provided by ethyl chloride  The injection was performed in the RIGHT  posterior subacromial space. After pinning the skin with alcohol and anesthetized the skin with ethyl chloride the subacromial space was injected using a 20-gauge needle. There were no complications  Sterile dressing was applied.  Meds ordered this encounter  Medications   naproxen  (NAPROSYN ) 500 MG tablet    Sig: Take 1 tablet (500 mg total) by mouth 2 (two) times daily with a meal.    Dispense:  60 tablet    Refill:  2

## 2023-11-09 NOTE — Addendum Note (Signed)
 Addended by: Vickki Hearing on: 11/09/2023 03:25 PM   Modules accepted: Orders

## 2023-11-09 NOTE — Addendum Note (Signed)
 Addended byCaffie Damme on: 11/09/2023 03:25 PM   Modules accepted: Orders

## 2023-11-09 NOTE — Patient Instructions (Signed)
 Joint Steroid Injection A joint steroid injection is a procedure to relieve swelling and pain in a joint. Steroids are medicines that reduce inflammation. In this procedure, your health care provider uses a syringe and a needle to inject a steroid medicine into a painful and inflamed joint. A pain-relieving medicine (anesthetic) may be injected along with the steroid. In some cases, your health care provider may use an imaging technique such as ultrasound or fluoroscopy to guide the injection. Joints that are often treated with steroid injections include the knee, shoulder, hip, and spine. These injections may also be used in the elbow, ankle, and joints of the hands or feet. You may have joint steroid injections as part of your treatment for inflammation caused by: Gout. Rheumatoid arthritis. Advanced wear-and-tear arthritis (osteoarthritis). Tendinitis. Bursitis. Joint steroid injections may be repeated, but having them too often can damage a joint or the skin over the joint. You should not have joint steroid injections less than 6 weeks apart or more than four times a year. Tell a health care provider about: Any allergies you have. All medicines you are taking, including vitamins, herbs, eye drops, creams, and over-the-counter medicines. Any problems you or family members have had with anesthetic medicines. Any blood disorders you have. Any surgeries you have had. Any medical conditions you have. Whether you are pregnant or may be pregnant. What are the risks? Generally, this is a safe treatment. However, problems may occur, including: Infection. Bleeding. Allergic reactions to medicines. Damage to the joint or tissues around the joint. Thinning of skin or loss of skin color over the joint. Temporary flushing of the face or chest. Temporary increase in pain. Temporary increase in blood sugar. Failure to relieve inflammation or pain. What happens before the treatment? Medicines Ask  your health care provider about: Changing or stopping your regular medicines. This is especially important if you are taking diabetes medicines or blood thinners. Taking medicines such as aspirin and ibuprofen. These medicines can thin your blood. Do not take these medicines unless your health care provider tells you to take them. Taking over-the-counter medicines, vitamins, herbs, and supplements. General instructions You may have imaging tests of your joint. Ask your health care provider if you can drive yourself home after the procedure. What happens during the treatment?  Your health care provider will position you for the injection and locate the injection site over your joint. The skin over the joint will be cleaned with a germ-killing soap. Your health care provider may: Spray a numbing solution (topical anesthetic) over the injection site. Inject a local anesthetic under the skin above your joint. The needle will be placed through your skin into your joint. Your health care provider may use imaging to guide the needle to the right spot for the injection. If imaging is used, a special contrast dye may be injected to confirm that the needle is in the correct location. The steroid medicine will be injected into your joint. Anesthetic may be injected along with the steroid. This may be a medicine that relieves pain for a short time (short-acting anesthetic) or for a longer time (long-acting anesthetic). The needle will be removed, and an adhesive bandage (dressing) will be placed over the injection site. The procedure may vary among health care providers and hospitals. What can I expect after the treatment? You will be able to go home after the treatment. It is normal to feel slight flushing for a few days after the injection. After the treatment, it is  common to have an increase in joint pain after the anesthetic has worn off. This may happen about an hour after a short-acting anesthetic  or about 8 hours after a longer-acting anesthetic. You should begin to feel relief from joint pain and swelling after 24 to 48 hours. Contact your health care provider if you do not begin to feel relief after 2 days. Follow these instructions at home: Injection site care Leave the adhesive dressing over your injection site in place until your health care provider says you can remove it. Check your injection site every day for signs of infection. Check for: More redness, swelling, or pain. Fluid or blood. Warmth. Pus or a bad smell. Activity Return to your normal activities as told by your health care provider. Ask your health care provider what activities are safe for you. You may be asked to limit activities that put stress on the joint for a few days. Do joint exercises as told by your health care provider. Do not take baths, swim, or use a hot tub until your health care provider approves. Ask your health care provider if you may take showers. You may only be allowed to take sponge baths. Managing pain, stiffness, and swelling  If directed, put ice on the joint. To do this: Put ice in a plastic bag. Place a towel between your skin and the bag. Leave the ice on for 20 minutes, 2-3 times a day. Remove the ice if your skin turns bright red. This is very important. If you cannot feel pain, heat, or cold, you have a greater risk of damage to the area. Raise (elevate) your joint above the level of your heart when you are sitting or lying down. General instructions Take over-the-counter and prescription medicines only as told by your health care provider. Do not use any products that contain nicotine or tobacco, such as cigarettes, e-cigarettes, and chewing tobacco. These can delay joint healing. If you need help quitting, ask your health care provider. If you have diabetes, be aware that your blood sugar may be slightly elevated for several days after the injection. Keep all follow-up visits.  This is important. Contact a health care provider if you have: Chills or a fever. Any signs of infection at your injection site. Increased pain or swelling or no relief after 2 days. Summary A joint steroid injection is a treatment to relieve pain and swelling in a joint. Steroids are medicines that reduce inflammation. Your health care provider may add an anesthetic along with the steroid. You may have joint steroid injections as part of your arthritis treatment. Joint steroid injections may be repeated, but having them too often can damage a joint or the skin over the joint. Contact your health care provider if you have a fever, chills, or signs of infection, or if you get no relief from joint pain or swelling. This information is not intended to replace advice given to you by your health care provider. Make sure you discuss any questions you have with your health care provider. Document Revised: 03/27/2020 Document Reviewed: 03/27/2020 Elsevier Patient Education  2024 [employer].

## 2023-11-20 ENCOUNTER — Other Ambulatory Visit (HOSPITAL_COMMUNITY): Payer: Self-pay | Admitting: Psychiatry

## 2023-11-23 ENCOUNTER — Other Ambulatory Visit: Payer: Self-pay | Admitting: Internal Medicine

## 2023-11-23 ENCOUNTER — Encounter (INDEPENDENT_AMBULATORY_CARE_PROVIDER_SITE_OTHER): Payer: Self-pay | Admitting: Gastroenterology

## 2023-11-23 ENCOUNTER — Telehealth: Payer: Self-pay | Admitting: Internal Medicine

## 2023-11-23 DIAGNOSIS — F339 Major depressive disorder, recurrent, unspecified: Secondary | ICD-10-CM

## 2023-11-23 MED ORDER — BUPROPION HCL ER (SR) 200 MG PO TB12: 200.0000 mg | ORAL_TABLET | Freq: Every day | ORAL | 5 refills | Status: DC

## 2023-11-23 NOTE — Telephone Encounter (Signed)
Ok to fill 

## 2023-11-23 NOTE — Telephone Encounter (Signed)
Copied from CRM [id number]. Topic: Clinical - Prescription Issue >> Nov 23, 2023  8:39 AM Joanette Gula wrote: Patient wants buPROPion [facility] SR) 200 MG 12 hr tablet to be filled but was told by the Pharmacy that a new order is needed..   Please send new order to [facility] - [place], [place] - [address] [place] [address] Phone: [phone] Fax: [phone]

## 2023-12-14 ENCOUNTER — Other Ambulatory Visit (HOSPITAL_COMMUNITY)
Admission: RE | Admit: 2023-12-14 | Discharge: 2023-12-14 | Disposition: A | Discharge status: Home / Self Care | Payer: Medicare Other | Source: Ambulatory Visit | Attending: Internal Medicine | Admitting: Internal Medicine

## 2023-12-14 DIAGNOSIS — E782 Mixed hyperlipidemia: Secondary | ICD-10-CM | POA: Diagnosis present

## 2023-12-14 DIAGNOSIS — E119 Type 2 diabetes mellitus without complications: Secondary | ICD-10-CM | POA: Diagnosis not present

## 2023-12-14 LAB — COMPREHENSIVE METABOLIC PANEL
ALT: 43 U/L (ref 0–44)
AST: 26 U/L (ref 15–41)
Albumin: 4.7 g/dL (ref 3.5–5.0)
Alkaline Phosphatase: 77 U/L (ref 38–126)
Anion gap: 13 (ref 5–15)
BUN: 16 mg/dL (ref 8–23)
CO2: 25 mmol/L (ref 22–32)
Calcium: 9.8 mg/dL (ref 8.9–10.3)
Chloride: 102 mmol/L (ref 98–111)
Creatinine, Ser: 0.71 mg/dL (ref 0.44–1.00)
GFR, Estimated: >60 mL/min (ref >60)
Glucose, Bld: 124 mg/dL — ABNORMAL HIGH (ref 70–99)
Potassium: 3.8 mmol/L (ref 3.5–5.1)
Sodium: 140 mmol/L (ref 135–145)
Total Bilirubin: 1.1 mg/dL (ref 0.0–1.2)
Total Protein: 7.9 g/dL (ref 6.5–8.1)

## 2023-12-14 LAB — HEMOGLOBIN A1C
Hgb A1c MFr Bld: 7.4 % — ABNORMAL HIGH (ref 4.8–5.6)
Mean Plasma Glucose: 165.68 mg/dL

## 2023-12-14 LAB — LIPID PANEL
Cholesterol: 142 mg/dL (ref 0–200)
HDL: 53 mg/dL (ref >40)
LDL Cholesterol: 48 mg/dL (ref 0–99)
Total CHOL/HDL Ratio: 2.7 {ratio}
Triglycerides: 207 mg/dL — ABNORMAL HIGH (ref <150)
VLDL: 41 mg/dL — ABNORMAL HIGH (ref 0–40)

## 2023-12-14 NOTE — Telephone Encounter (Signed)
Spoke to pt states she was able to get labs done.

## 2023-12-14 NOTE — Telephone Encounter (Signed)
Copied from CRM [id number]. Topic: Clinical - Request for Lab/Test Order >> Dec 14, 2023 12:26 PM Martha Clan wrote: Reason for CRM: Patient is at outpatient lab awaiting orders to be out through for her appointment on Feb 17th.  Fax [phone]

## 2023-12-18 ENCOUNTER — Ambulatory Visit: Payer: Medicare Other | Admitting: Internal Medicine

## 2023-12-18 ENCOUNTER — Ambulatory Visit (INDEPENDENT_AMBULATORY_CARE_PROVIDER_SITE_OTHER): Payer: Medicare Other | Admitting: Gastroenterology

## 2023-12-18 ENCOUNTER — Encounter: Payer: Self-pay | Admitting: Internal Medicine

## 2023-12-18 VITALS — BP 122/80 | HR 75 | Ht 62.0 in | Wt 183.6 lb

## 2023-12-18 DIAGNOSIS — M5136 Other intervertebral disc degeneration, lumbar region with discogenic back pain only: Secondary | ICD-10-CM

## 2023-12-18 DIAGNOSIS — E782 Mixed hyperlipidemia: Secondary | ICD-10-CM | POA: Diagnosis not present

## 2023-12-18 DIAGNOSIS — E1169 Type 2 diabetes mellitus with other specified complication: Secondary | ICD-10-CM | POA: Diagnosis not present

## 2023-12-18 DIAGNOSIS — K219 Gastro-esophageal reflux disease without esophagitis: Secondary | ICD-10-CM

## 2023-12-18 DIAGNOSIS — I10 Essential (primary) hypertension: Secondary | ICD-10-CM

## 2023-12-18 DIAGNOSIS — F339 Major depressive disorder, recurrent, unspecified: Secondary | ICD-10-CM | POA: Diagnosis not present

## 2023-12-18 MED ORDER — VASCEPA 1 G PO CAPS: 2.0000 g | ORAL_CAPSULE | Freq: Two times a day (BID) | ORAL | 3 refills | Status: AC

## 2023-12-18 MED ORDER — PANTOPRAZOLE SODIUM 40 MG PO TBEC: 40.0000 mg | DELAYED_RELEASE_TABLET | Freq: Every day | ORAL | 3 refills | Status: AC

## 2023-12-18 MED ORDER — TIRZEPATIDE 2.5 MG/0.5ML ~~LOC~~ SOAJ: 2.5000 mg | SUBCUTANEOUS | 0 refills | Status: DC

## 2023-12-18 MED ORDER — METOPROLOL SUCCINATE ER 25 MG PO TB24: 25.0000 mg | ORAL_TABLET | Freq: Every day | ORAL | 1 refills | Status: DC

## 2023-12-18 MED ORDER — LOSARTAN POTASSIUM-HCTZ 100-12.5 MG PO TABS
1.0000 | ORAL_TABLET | Freq: Every day | ORAL | 1 refills | Status: AC
Start: 2023-12-18 — End: ?

## 2023-12-18 NOTE — Patient Instructions (Signed)
 Please start taking Mounjaro 2.5 mg once weekly.  Please continue to take medications as prescribed.  Please continue to follow low carb diet and perform moderate exercise/walking as tolerated.

## 2023-12-18 NOTE — Assessment & Plan Note (Addendum)
 BP Readings from Last 1 Encounters:  12/18/23 122/80   Usually well-controlled with  Losartan-HCTZ 100-12.5 mg QD and Metoprolol 25 mg QD Counseled for compliance with the medications Advised DASH diet and moderate exercise/walking, at least 150 mins/week

## 2023-12-18 NOTE — Assessment & Plan Note (Signed)
 Checked lipid profile - has h/o DM and hypertriglyceridemia On Crestor and Vascepa

## 2023-12-18 NOTE — Progress Notes (Signed)
 Established Patient Office Visit  Subjective:  Patient ID: Carolyn [name], female    DOB: [birth year]  Age: 73 y.o. MRN: [id number]  CC:  Chief Complaint  Patient presents with   Diabetes   Hypertension   Depression    HPI Carolyn [name] is a 73 y.o. female with past medical history of HTN, PE, OSA, IBS, DM, HLD, depression and obesity who presents for f/u of her chronic medical conditions.  HTN: BP is wnl. She takes Losartan-hydrochlorothiazide 100-12.5 mg once daily and Metoprolol 25 mg once daily regularly. Patient denies headache, dizziness, chest pain, dyspnea or palpitations.  Type II DM: She takes Farxiga 10 mg QD for it. Her last HbA1c was 7.4 in 02/25. Denies any polyuria or polyphagia currently.  She has had nutritionist counseling and has been trying to improve her diet.  She has been taking statin and Vascepa for HLD.  Depression: She is doing well overall with Wellbutrin SR 200 mg QD.  She has been recently stressed due to her husband's stroke.  She used to see psychiatry, but has been discharged now.  She denies any SI or HI currently. She has been on Zoloft, Lexapro and Prozac in the past, which were ineffective.    Past Medical History:  Diagnosis Date   Depression    Diabetes mellitus without complication (HCC)    Diastolic dysfunction    Hyperlipidemia    Myasthenia gravis (HCC)    Patient is Jehovah's Witness    Pulmonary embolism (HCC) 05/18/2021   Sleep apnea     Past Surgical History:  Procedure Laterality Date   APPENDECTOMY     BIOPSY  11/11/2020   Procedure: BIOPSY;  Surgeon: [name], MD;  [facility]: [facility];  Service: Gastroenterology;;  duodenum gastric   BUNIONECTOMY     COLONOSCOPY WITH PROPOFOL N/A 11/11/2020   Procedure: COLONOSCOPY WITH PROPOFOL;  Surgeon: [name], MD;  Location: [facility];  Service: Gastroenterology;  Laterality: N/A;  11:15   ESOPHAGEAL DILATION  03/23/2023   Procedure:  ESOPHAGEAL DILATION;  Surgeon: Marguerita Merles, Reuel Boom, MD;  Location: [facility];  Service: Gastroenterology;;   ESOPHAGOGASTRODUODENOSCOPY (EGD) WITH PROPOFOL N/A 11/11/2020   Procedure: ESOPHAGOGASTRODUODENOSCOPY (EGD) WITH PROPOFOL;  Surgeon: [name], MD;  [facility]: [facility];  Service: Gastroenterology;  Laterality: N/A;  to arrive at 0845   ESOPHAGOGASTRODUODENOSCOPY (EGD) WITH PROPOFOL N/A 03/23/2023   Procedure: ESOPHAGOGASTRODUODENOSCOPY (EGD) WITH PROPOFOL;  Surgeon: [name], MD;  Location: [facility];  Service: Gastroenterology;  Laterality: N/A;  7:30am;asa 2   MANDIBLE SURGERY     TONSILECTOMY, ADENOIDECTOMY, BILATERAL MYRINGOTOMY AND TUBES      Family History  Problem Relation Age of Onset   Arthritis Mother    Depression Mother    Diabetes Mother    Hypertension Mother    Alcohol abuse Father    Hypertension Father    Heart disease Father 29       Massive MI   Diabetes Brother    Diabetes Brother    Heart disease Brother    Diabetes Brother        Type I     Social History   Socioeconomic History   Marital status: Married    Spouse name: Casimiro Needle   Number of children: 4   Years of education: some college   Highest education level: Some college, no degree  Occupational History   Occupation: Retired  Tobacco Use   Smoking status:  Never    Passive exposure: Never   Smokeless tobacco: Never  Vaping Use   Vaping status: Never Used  Substance and Sexual Activity   Alcohol use: Yes    Comment: socially on rare occasions   Drug use: No   Sexual activity: Yes    Birth control/protection: Other-see comments    Comment: postmenopausal  Other Topics Concern   Not on file  Social History Narrative   Not on file   Social Drivers of Health   Financial Resource Strain: Low Risk  (11/02/2023)   Overall Financial Resource Strain (CARDIA)    Difficulty of Paying Living Expenses: Not hard at all  Food Insecurity: No Food  Insecurity (11/02/2023)   Hunger Vital Sign    Worried About Running Out of Food in the Last Year: Never true    Ran Out of Food in the Last Year: Never true  Transportation Needs: No Transportation Needs (11/02/2023)   PRAPARE - Administrator, Civil Service (Medical): No    Lack of Transportation (Non-Medical): No  Physical Activity: Insufficiently Active (11/02/2023)   Exercise Vital Sign    Days of Exercise per Week: 2 days    Minutes of Exercise per Session: 20 min  Stress: No Stress Concern Present (11/02/2023)   [employer] of Occupational Health - Occupational Stress Questionnaire    Feeling of Stress : Only a little  Social Connections: Moderately Integrated (11/02/2023)   Social Connection and Isolation Panel [NHANES]    Frequency of Communication with Friends and Family: More than three times a week    Frequency of Social Gatherings with Friends and Family: Three times a week    Attends Religious Services: More than 4 times per year    Active Member of Clubs or Organizations: No    Attends Banker Meetings: Not on file    Marital Status: Married  Intimate Partner Violence: Not At Risk (02/10/2021)   Humiliation, Afraid, Rape, and Kick questionnaire    Fear of Current or Ex-Partner: No    Emotionally Abused: No    Physically Abused: No    Sexually Abused: No    Outpatient Medications Prior to Visit  Medication Sig Dispense Refill   Blood Glucose Monitoring Suppl (BLOOD GLUCOSE SYSTEM PAK) KIT Use as directed to monitor FSBS 2x daily. Dx: E11.9 1 each 1   buPROPion (WELLBUTRIN SR) 200 MG 12 hr tablet Take 1 tablet (200 mg total) by mouth daily. 30 tablet 5   Cholecalciferol (VITAMIN D3) 125 MCG (5000 UT) TABS Take 5,000 Units by mouth daily.     Coenzyme Q10 (CO Q 10 PO) Take 1 capsule by mouth daily.     FARXIGA 10 MG TABS tablet TAKE 1 TABLET(10 MG) BY MOUTH DAILY BEFORE BREAKFAST 90 tablet 2   glucose blood (ONETOUCH VERIO) test strip USE TO TEST  BLOOD SUGAR TWICE DAILY 100 strip 3   Glycerin-Hypromellose-PEG 400 (DRY EYE RELIEF DROPS) 0.2-0.2-1 % SOLN Place 1 drop into both eyes daily as needed (Dry eye). homeopathic     hyoscyamine (LEVSIN SL) 0.125 MG SL tablet Place 1 tablet (0.125 mg total) under the tongue every 6 (six) hours as needed (spasms). 60 tablet 1   MAGNESIUM PO Take 2 tablets by mouth daily at 2 am.     Multiple Vitamins-Minerals (MULTI COMPLETE) CAPS Take 1 capsule by mouth daily.     naproxen (NAPROSYN) 500 MG tablet Take 1 tablet (500 mg total) by mouth 2 (two) times  daily with a meal. 60 tablet 2   rosuvastatin (CRESTOR) 10 MG tablet Take 1 tablet (10 mg total) by mouth daily. 90 tablet 1   Wheat Dextrin (BENEFIBER PO) Take by mouth. Takes one tablespoon tid     levocetirizine (XYZAL) 5 MG tablet Take 1 tablet (5 mg total) by mouth every evening. 30 tablet 1   losartan-hydrochlorothiazide (HYZAAR) 100-12.5 MG tablet Take 1 tablet by mouth daily. 90 tablet 1   metoprolol succinate (TOPROL-XL) 25 MG 24 hr tablet TAKE 1 TABLET(25 MG) BY MOUTH DAILY 90 tablet 1   pantoprazole (PROTONIX) 40 MG tablet Take 1 tablet (40 mg total) by mouth daily. 90 tablet 3   sodium chloride (OCEAN) 0.65 % SOLN nasal spray Place 1 spray into both nostrils as needed for congestion. 50 mL 0   VASCEPA 1 g capsule Take 2 capsules (2 g total) by mouth 2 (two) times daily. (Patient taking differently: Take 1 g by mouth 2 (two) times daily.) 360 capsule 3   No facility-administered medications prior to visit.    Allergies  Allergen Reactions   Prednisolone Acetate-Nepafenac Other (See Comments)    1% eye solution Causes eye pain and severe headaches   Prozac [Fluoxetine Hcl] Anaphylaxis   Codeine Other (See Comments) and Nausea And Vomiting    Intolerance   Erythromycin Other (See Comments)   Fluoxetine Hives   Other     -mycin medications: has myasthenia gravis    Sulfa Antibiotics Hives    ROS Review of Systems  Constitutional:   Negative for chills and fever.  HENT:  Negative for congestion, sinus pressure, sinus pain and sore throat.   Eyes:  Negative for pain and discharge.  Respiratory:  Negative for cough and shortness of breath.   Cardiovascular:  Negative for chest pain and palpitations.  Gastrointestinal:  Negative for diarrhea, nausea and vomiting.  Endocrine: Negative for polydipsia and polyuria.  Genitourinary:  Negative for dysuria and hematuria.  Musculoskeletal:  Positive for back pain. Negative for neck pain and neck stiffness.       R shoulder pain  Skin:  Negative for rash.  Neurological:  Negative for dizziness and weakness.  Psychiatric/Behavioral:  Positive for sleep disturbance. Negative for agitation and behavioral problems. The patient is nervous/anxious.       Objective:    Physical Exam Vitals reviewed.  Constitutional:      General: She is not in acute distress.    Appearance: She is not diaphoretic.  HENT:     Head: Normocephalic and atraumatic.     Nose: Nose normal.     Mouth/Throat:     Mouth: Mucous membranes are moist.  Eyes:     General: No scleral icterus.    Extraocular Movements: Extraocular movements intact.  Cardiovascular:     Rate and Rhythm: Normal rate and regular rhythm.     Pulses: Normal pulses.     Heart sounds: Normal heart sounds. No murmur heard. Pulmonary:     Breath sounds: Normal breath sounds. No wheezing or rales.  Abdominal:     Palpations: Abdomen is soft.     Tenderness: There is no abdominal tenderness.  Musculoskeletal:     Cervical back: Neck supple. No tenderness.     Lumbar back: Tenderness present. Decreased range of motion.     Right lower leg: No edema.     Left lower leg: No edema.  Skin:    General: Skin is warm.     Findings: No rash.  Neurological:     General: No focal deficit present.     Mental Status: She is alert and oriented to person, place, and time.     Sensory: No sensory deficit.     Motor: No weakness.   Psychiatric:        Mood and Affect: Mood normal.        Behavior: Behavior is slowed. Behavior is cooperative.     BP 122/80 (BP Location: Left Arm)   Pulse 75   Ht 5\' 2"  (1.575 m)   Wt 183 lb 9.6 oz (83.3 kg)   SpO2 97%   BMI 33.58 kg/m  Wt Readings from Last 3 Encounters:  12/18/23 183 lb 9.6 oz (83.3 kg)  11/09/23 180 lb (81.6 kg)  10/30/23 181 lb (82.1 kg)    Lab Results  Component Value Date   TSH 2.460 08/16/2023   Lab Results  Component Value Date   WBC 6.7 08/16/2023   HGB 16.3 (H) 08/16/2023   HCT 48.7 (H) 08/16/2023   MCV 95 08/16/2023   PLT 225 08/16/2023   Lab Results  Component Value Date   NA 140 12/14/2023   K 3.8 12/14/2023   CO2 25 12/14/2023   GLUCOSE 124 (H) 12/14/2023   BUN 16 12/14/2023   CREATININE 0.71 12/14/2023   BILITOT 1.1 12/14/2023   ALKPHOS 77 12/14/2023   AST 26 12/14/2023   ALT 43 12/14/2023   PROT 7.9 12/14/2023   ALBUMIN 4.7 12/14/2023   CALCIUM 9.8 12/14/2023   ANIONGAP 13 12/14/2023   EGFR 67 08/16/2023   Lab Results  Component Value Date   CHOL 142 12/14/2023   Lab Results  Component Value Date   HDL 53 12/14/2023   Lab Results  Component Value Date   LDLCALC 48 12/14/2023   Lab Results  Component Value Date   TRIG 207 (H) 12/14/2023   Lab Results  Component Value Date   CHOLHDL 2.7 12/14/2023   Lab Results  Component Value Date   HGBA1C 7.4 (H) 12/14/2023      Assessment & Plan:   Problem List Items Addressed This Visit       Cardiovascular and Mediastinum   Hypertension   BP Readings from Last 1 Encounters:  12/18/23 122/80   Usually well-controlled with  Losartan-HCTZ 100-12.5 mg QD and Metoprolol 25 mg QD Counseled for compliance with the medications Advised DASH diet and moderate exercise/walking, at least 150 mins/week      Relevant Medications   metoprolol succinate (TOPROL-XL) 25 MG 24 hr tablet   VASCEPA 1 g capsule   losartan-hydrochlorothiazide (HYZAAR) 100-12.5 MG tablet      Digestive   Gastroesophageal reflux disease   Well controlled with pantoprazole now Avoid hot and spicy food      Relevant Medications   pantoprazole (PROTONIX) 40 MG tablet     Endocrine   Type 2 diabetes mellitus with other specified complication (HCC) - Primary   Lab Results  Component Value Date   HGBA1C 7.4 (H) 12/14/2023   Associated with HLD and HTN Uncontrolled, but she is trying to improve her diet -followed by nutritionist On Farxiga, did not tolerate Metformin Started Mounjaro 2.5 mg qw Advised to follow diabetic diet On ARB and statin      Relevant Medications   tirzepatide (MOUNJARO) 2.5 MG/0.5ML Pen   losartan-hydrochlorothiazide (HYZAAR) 100-12.5 MG tablet     Musculoskeletal and Integument   DDD (degenerative disc disease), lumbar   Has chronic low back pain -likely  due to DDD of lumbar spine Checked x-ray of lumbar spine Avoid heavy lifting and frequent Bendi Tylenol arthritis as needed for pain, can take Naproxen on alternate basis for persistent pain Followed by orthopedic surgery - planned to restart PT        Other   Depression, recurrent (HCC)   Flowsheet Row Office Visit from 12/18/2023 in [facility]  PHQ-9 Total Score 0      Well-controlled, on Wellbutrin 200 mg SR QD Had severe anxiety and insomnia with Zoloft 50 mg QD, discontinued Did not have benefit with Lexapro in the past Used to be followed by psychiatry in [place]      Hyperlipidemia   Checked lipid profile - has h/o DM and hypertriglyceridemia On Crestor and Vascepa      Relevant Medications   metoprolol succinate (TOPROL-XL) 25 MG 24 hr tablet   VASCEPA 1 g capsule   losartan-hydrochlorothiazide (HYZAAR) 100-12.5 MG tablet    Meds ordered this encounter  Medications   metoprolol succinate (TOPROL-XL) 25 MG 24 hr tablet    Sig: Take 1 tablet (25 mg total) by mouth daily.    Dispense:  90 tablet    Refill:  1   pantoprazole (PROTONIX)  40 MG tablet    Sig: Take 1 tablet (40 mg total) by mouth daily.    Dispense:  90 tablet    Refill:  3   VASCEPA 1 g capsule    Sig: Take 2 capsules (2 g total) by mouth 2 (two) times daily.    Dispense:  360 capsule    Refill:  3   tirzepatide (MOUNJARO) 2.5 MG/0.5ML Pen    Sig: Inject 2.5 mg into the skin once a week.    Dispense:  2 mL    Refill:  0   losartan-hydrochlorothiazide (HYZAAR) 100-12.5 MG tablet    Sig: Take 1 tablet by mouth daily.    Dispense:  90 tablet    Refill:  1    Follow-up: Return in about 4 months (around 04/16/2024) for DM.    Anabel Halon, MD

## 2023-12-18 NOTE — Assessment & Plan Note (Addendum)
 Lab Results  Component Value Date   HGBA1C 7.4 (H) 12/14/2023   Associated with HLD and HTN Uncontrolled, but she is trying to improve her diet -followed by nutritionist On Farxiga, did not tolerate Metformin Started Mounjaro 2.5 mg qw Advised to follow diabetic diet On ARB and statin

## 2023-12-18 NOTE — Assessment & Plan Note (Signed)
 Flowsheet Row Office Visit from 12/18/2023 in [facility]  PHQ-9 Total Score 0      Well-controlled, on Wellbutrin 200 mg SR QD Had severe anxiety and insomnia with Zoloft 50 mg QD, discontinued Did not have benefit with Lexapro in the past Used to be followed by psychiatry in [place]

## 2023-12-18 NOTE — Assessment & Plan Note (Signed)
 Well controlled with pantoprazole now Avoid hot and spicy food

## 2023-12-18 NOTE — Assessment & Plan Note (Signed)
 Has chronic low back pain -likely due to DDD of lumbar spine Checked x-ray of lumbar spine Avoid heavy lifting and frequent Bendi Tylenol arthritis as needed for pain, can take Naproxen on alternate basis for persistent pain Followed by orthopedic surgery - planned to restart PT

## 2024-01-01 ENCOUNTER — Ambulatory Visit: Payer: Self-pay | Admitting: Internal Medicine

## 2024-01-01 ENCOUNTER — Telehealth: Payer: Self-pay | Admitting: Internal Medicine

## 2024-01-01 NOTE — Telephone Encounter (Signed)
  Chief Complaint: vision problem, double vision Symptoms: double vision, eye pain Frequency: 2.5 weeks Pertinent Negatives: Patient denies fever, injury to eyes, HA,  Disposition: [] ED /[] Urgent Care (no appt availability in office) / [x] Appointment(In office/virtual)/ []  [facility]/ [] Home Care/ [] Refused Recommended Disposition /[] [facility]/ []  Follow-up with PCP Additional Notes: Pt states that for about 2.5 weeks she has been experiencing double vision and pain in her R eye. Pt states that she has had cataract surg, does not wear glasses nor contacts. States that she has been told in the past that she has MG, but states that she doesn't know if that dx was real. Pt states that she called an eye specialist and they told her that she could not be seen until April. Pt sched today with PCP.   Reason for Disposition  Eye pain present > 24 hours  Answer Assessment - Initial Assessment Questions 1. ONSET: "When did the pain start?" (e.g., minutes, hours, days)     About 2.5 weeks ago 2. TIMING: "Does the pain come and go, or has it been constant since it started?" (e.g., constant, intermittent, fleeting)     intermittent 3. SEVERITY: "How bad is the pain?"   (Scale 1-10; mild, moderate or severe)   - MILD (1-3): doesn't interfere with normal activities    - MODERATE (4-7): interferes with normal activities or awakens from sleep    - SEVERE (8-10): excruciating pain and patient unable to do normal activities     3 4. LOCATION: "Where does it hurt?"  (e.g., eyelid, eye, cheekbone)     Pain only in R eye 5. CAUSE: "What do you think is causing the pain?"     I have been told in the past that I have MG 6. VISION: "Do you have blurred vision or changes in your vision?"      Double vision 7. EYE DISCHARGE: "Is there any discharge (pus) from the eye(s)?"  If Yes, ask: "What color is it?"      Tiny bit, clear 8. FEVER: "Do you have a fever?" If Yes, ask: "What is it, how  was it measured, and when did it start?"      denies 9. OTHER SYMPTOMS: "Do you have any other symptoms?" (e.g., headache, nasal discharge, facial rash)     denies  Protocols used: Eye Pain and Other Symptoms-A-AH

## 2024-01-01 NOTE — Telephone Encounter (Signed)
 Asking for sooner appt to be work into the schedule. To see Dr Allena Katz only can not til end of April.

## 2024-01-01 NOTE — Telephone Encounter (Signed)
 Scheduled for tomorrow for eval.

## 2024-01-01 NOTE — Telephone Encounter (Unsigned)
 Copied from CRM [id number]. Topic: Clinical - Medical Advice >> Jan 01, 2024  8:56 AM Marland Kitchen D wrote: Reason for CRM: Patient is having double vision and it started 3 days ago patient's eye doctor retired .Patient worries about driving sometimes and wants to know if she needs a referral fro Dr. Allena Katz to have this checked. Patient mentioned it's painful.

## 2024-01-02 ENCOUNTER — Telehealth: Payer: Self-pay | Admitting: Internal Medicine

## 2024-01-02 ENCOUNTER — Ambulatory Visit: Admitting: Internal Medicine

## 2024-01-02 ENCOUNTER — Ambulatory Visit (INDEPENDENT_AMBULATORY_CARE_PROVIDER_SITE_OTHER): Payer: Medicare Other | Admitting: Gastroenterology

## 2024-01-02 ENCOUNTER — Encounter (INDEPENDENT_AMBULATORY_CARE_PROVIDER_SITE_OTHER): Payer: Self-pay | Admitting: Gastroenterology

## 2024-01-02 VITALS — BP 107/72 | HR 79 | Temp 97.2°F | Ht 62.0 in | Wt 185.8 lb

## 2024-01-02 DIAGNOSIS — K5900 Constipation, unspecified: Secondary | ICD-10-CM | POA: Diagnosis not present

## 2024-01-02 DIAGNOSIS — R1310 Dysphagia, unspecified: Secondary | ICD-10-CM | POA: Diagnosis not present

## 2024-01-02 DIAGNOSIS — K219 Gastro-esophageal reflux disease without esophagitis: Secondary | ICD-10-CM | POA: Diagnosis not present

## 2024-01-02 NOTE — Progress Notes (Signed)
 We do not have any availability for March for Dr.C. Is pt ok to wait until April? Or if some one cancels I can schedule.

## 2024-01-02 NOTE — Progress Notes (Addendum)
 Referring Provider: Anabel Halon, MD Primary Care Physician:  Anabel Halon, MD Primary GI Physician: Dr. Levon Hedger   Chief Complaint  Patient presents with   Follow-up    Patient here today for a follow up on Constipation. She is still having issues with Constipation. She is taking benefiber once to twice per day, she is also eating a high fiber diet.   HPI:   Carolyn [name] is a 73 y.o. female with past medical history of DM, depression HLD, Myasthenia Gravis and IBS   Patient presenting today for follow up of: Constipation GERD Esophageal/spasms  Last seen August 2024, at that time patient reported esophageal spasms occurring less frequent than before. Taken hyoscyamine a few times which seem to help though not taking it currently.  Taking Protonix 40 mg daily, occasional breakthrough heartburn usually with trigger foods.  Having some dysphagia few times per week.  Constipation pretty well-managed and Benefiber 1-2 times per week, sometimes uses move tea, blueberries or prune juice to help with feeling constipated.  Upcoming esophageal manometry in September.  Present: Patient is maintained on pantoprazole 40mg  once daily. Having rare breakthrough symptoms but note she is having more episodes of dysphagia. She notes that she had to be off of her protonix for a few weeks as she had an issues with moving pharmacies. She notes that dysphagia felt worse while off the PPI but has not had much improvement since being back on her medication. She is having issues with dysphagia almost daily, even with water. She does note that she felt some improvement after last EGD. She has some occasional upper abdominal pain. No nausea or vomiting.   Constipation is manageable. She is taking benefiber 1- 2 times per day based on how she is feeling. She did recently go on mounjaro which she knows can affect her bowel habits. She does note a decline in appetite since starting this and is trying to eat  atleast 3 times per day even if just a snack.  She is usually having a BM about 5 days out of the week. Doing more fruits with blueberries, kiwi and prunes as needed. She drinks water all throughout the day.    Does levsin on occasion for rectal spasms, needs this only 2-3 times per month. She has occasional esophageal spasms but notes she forgot she could also use levsin for this.   pH impedence testing 07/2023:The patient has a normal esophageal acid exposure time for a patient off medication measured by pH according to the [place] [employer].  There is a normal reflux frequency measured by impedance. Symptom indices  as outlined above. The clinical significance of positive indices in a  normal study is of unknown clinical significance and could suggest  visceral hypersensitivity.  esophageal manometry 07/2023: This is an abnormal esophageal motility study with a  hypertensive basal LES tone and an elevated median IRP.  There is evidnce  of hypercontractile peristalsis.. The IRP is elevated in both the primary  (supine) and secondary (upright) position. 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 Of note, there is an elevated UES residual pressure.  RUQ [place] in June 2024 with hepatic steatosis BPE: 03/2023 abnormal esophageal contractions, recommended to continue with esophageal manometry  Last Colonoscopy: 11/11/2020, normal colon with presence of internal hemorrhoids Last Endoscopy: 03/2023 Abnormal esophageal motility, suspicious for  esophageal spasm. Dilated.                           - Normal stomach.                           - Normal examined duodenum.                           - 1 cm hiatal hernia.                           - No specimens collected.   Filed Weights   01/02/24 1010  Weight: 185 lb 12.8 oz (84.3 kg)     Past  Medical History:  Diagnosis Date   Depression    Diabetes mellitus without complication (HCC)    Diastolic dysfunction    Hyperlipidemia    Myasthenia gravis (HCC)    Patient is Jehovah's Witness    Pulmonary embolism (HCC) 05/18/2021   Sleep apnea     Past Surgical History:  Procedure Laterality Date   APPENDECTOMY     BIOPSY  11/11/2020   Procedure: BIOPSY;  Surgeon: [name], MD;  [facility];  Service: Gastroenterology;;  duodenum gastric   BUNIONECTOMY     COLONOSCOPY WITH PROPOFOL N/A 11/11/2020   Procedure: COLONOSCOPY WITH PROPOFOL;  Surgeon: [name], MD;  Location: [facility];  Service: Gastroenterology;  Laterality: N/A;  11:15   ESOPHAGEAL DILATION  03/23/2023   Procedure: ESOPHAGEAL DILATION;  Surgeon: Marguerita Merles, Reuel Boom, MD;  Location: [facility];  Service: Gastroenterology;;   ESOPHAGOGASTRODUODENOSCOPY (EGD) WITH PROPOFOL N/A 11/11/2020   Procedure: ESOPHAGOGASTRODUODENOSCOPY (EGD) WITH PROPOFOL;  Surgeon: [name], MD;  [facility]: [facility];  Service: Gastroenterology;  Laterality: N/A;  to arrive at 0845   ESOPHAGOGASTRODUODENOSCOPY (EGD) WITH PROPOFOL N/A 03/23/2023   Procedure: ESOPHAGOGASTRODUODENOSCOPY (EGD) WITH PROPOFOL;  Surgeon: [name], MD;  Location: [facility];  Service: Gastroenterology;  Laterality: N/A;  7:30am;asa 2   MANDIBLE SURGERY     TONSILECTOMY, ADENOIDECTOMY, BILATERAL MYRINGOTOMY AND TUBES      Current Outpatient Medications  Medication Sig Dispense Refill   Blood Glucose Monitoring Suppl (BLOOD GLUCOSE SYSTEM PAK) KIT Use as directed to monitor FSBS 2x daily. Dx: E11.9 1 each 1   buPROPion (WELLBUTRIN SR) 200 MG 12 hr tablet Take 1 tablet (200 mg total) by mouth daily. 30 tablet 5   Cholecalciferol (VITAMIN D3) 125 MCG (5000 UT) TABS Take 5,000 Units by mouth daily.     Coenzyme Q10 (CO Q 10 PO) Take 1 capsule by mouth daily.     FARXIGA 10 MG  TABS tablet TAKE 1 TABLET(10 MG) BY MOUTH DAILY BEFORE BREAKFAST 90 tablet 2   glucose blood (ONETOUCH VERIO) test strip USE TO TEST BLOOD SUGAR TWICE DAILY 100 strip 3   Glycerin-Hypromellose-PEG 400 (DRY EYE RELIEF DROPS) 0.2-0.2-1 % SOLN Place 1 drop into both eyes daily as needed (Dry eye). homeopathic     hyoscyamine (LEVSIN SL) 0.125 MG SL tablet Place 1 tablet (0.125 mg total) under the tongue every 6 (six) hours as needed (spasms). 60 tablet 1   losartan-hydrochlorothiazide (HYZAAR) 100-12.5 MG tablet Take 1 tablet by mouth daily. 90 tablet 1   MAGNESIUM PO Take 2 tablets by mouth daily at 2 am.  metoprolol succinate (TOPROL-XL) 25 MG 24 hr tablet Take 1 tablet (25 mg total) by mouth daily. 90 tablet 1   Multiple Vitamins-Minerals (MULTI COMPLETE) CAPS Take 1 capsule by mouth daily.     naproxen (NAPROSYN) 500 MG tablet Take 1 tablet (500 mg total) by mouth 2 (two) times daily with a meal. 60 tablet 2   pantoprazole (PROTONIX) 40 MG tablet Take 1 tablet (40 mg total) by mouth daily. 90 tablet 3   rosuvastatin (CRESTOR) 10 MG tablet Take 1 tablet (10 mg total) by mouth daily. 90 tablet 1   tirzepatide (MOUNJARO) 2.5 MG/0.5ML Pen Inject 2.5 mg into the skin once a week. 2 mL 0   VASCEPA 1 g capsule Take 2 capsules (2 g total) by mouth 2 (two) times daily. 360 capsule 3   Wheat Dextrin (BENEFIBER PO) Take by mouth. Takes one tablespoon tid     No current facility-administered medications for this visit.    Allergies as of 01/02/2024 - Review Complete 01/02/2024  Allergen Reaction Noted   Prednisolone acetate-nepafenac Other (See Comments) 10/18/2023   Prozac [fluoxetine hcl] Anaphylaxis 12/16/2015   Codeine Other (See Comments) and Nausea And Vomiting 12/16/2015   Erythromycin Other (See Comments) 12/16/2015   Fluoxetine Hives 10/18/2023   Other  10/30/2017   Sulfa antibiotics Hives 12/16/2015    Social History   Socioeconomic History   Marital status: Married    Spouse name:  Casimiro Needle   Number of children: 4   Years of education: some college   Highest education level: Some college, no degree  Occupational History   Occupation: Retired  Tobacco Use   Smoking status: Never    Passive exposure: Never   Smokeless tobacco: Never  Vaping Use   Vaping status: Never Used  Substance and Sexual Activity   Alcohol use: Yes    Comment: socially on rare occasions   Drug use: No   Sexual activity: Yes    Birth control/protection: Other-see comments    Comment: postmenopausal  Other Topics Concern   Not on file  Social History Narrative   Not on file   Social Drivers of Health   Financial Resource Strain: Low Risk  (11/02/2023)   Overall Financial Resource Strain (CARDIA)    Difficulty of Paying Living Expenses: Not hard at all  Food Insecurity: No Food Insecurity (11/02/2023)   Hunger Vital Sign    Worried About Running Out of Food in the Last Year: Never true    Ran Out of Food in the Last Year: Never true  Transportation Needs: No Transportation Needs (11/02/2023)   PRAPARE - Administrator, Civil Service (Medical): No    Lack of Transportation (Non-Medical): No  Physical Activity: Insufficiently Active (11/02/2023)   Exercise Vital Sign    Days of Exercise per Week: 2 days    Minutes of Exercise per Session: 20 min  Stress: No Stress Concern Present (11/02/2023)   [employer] of Occupational Health - Occupational Stress Questionnaire    Feeling of Stress : Only a little  Social Connections: Moderately Integrated (11/02/2023)   Social Connection and Isolation Panel [NHANES]    Frequency of Communication with Friends and Family: More than three times a week    Frequency of Social Gatherings with Friends and Family: Three times a week    Attends Religious Services: More than 4 times per year    Active Member of Clubs or Organizations: No    Attends Banker Meetings: Not on  file    Marital Status: Married    Review of  systems General: negative for malaise, night sweats, fever, chills, weight los Neck: Negative for lumps, goiter, pain and significant neck swelling Resp: Negative for cough, wheezing, dyspnea at rest CV: Negative for chest pain, leg swelling, palpitations, orthopnea GI: denies melena, hematochezia, nausea, vomiting, diarrhea, constipation, odyonophagia, early satiety or unintentional weight loss. +dysphagia  The remainder of the review of systems is noncontributory.  Physical Exam: BP 107/72 (BP Location: Left Arm, Patient Position: Sitting, Cuff Size: Large)   Pulse 79   Temp (!) 97.2 F (36.2 C) (Temporal)   Ht 5\' 2"  (1.575 m)   Wt 185 lb 12.8 oz (84.3 kg)   BMI 33.98 kg/m  General:   Alert and oriented. No distress noted. Pleasant and cooperative.  Head:  Normocephalic and atraumatic. Eyes:  Conjuctiva clear without scleral icterus. Mouth:  Oral mucosa pink and moist. Good dentition. No lesions. Heart: Normal rate and rhythm, s1 and s2 heart sounds present.  Lungs: Clear lung sounds in all lobes. Respirations equal and unlabored. Abdomen:  +BS, soft, and non-distended. TTP of mid epigastric to RUQ area. No rebound or guarding. No HSM or masses noted. Neurologic:  Alert and  oriented x4 Psych:  Alert and cooperative. Normal mood and affect.  Invalid input(s): "6 MONTHS"   ASSESSMENT: MYLI PAE is a 73 y.o. female presenting today for follow up of GERD, dysphagia and constipation  GERD: Denies any breakthrough heartburn or acid regurgitation on pantoprazole 40 mg daily.  Continue with current PPI regimen  Dysphagia: Patient continues to experience dysphagia and notes this has been worse recently.  Fairly recent EGD in May 2024 as well as BPE around that time with evidence of esophageal dysmotility.  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   She does have history of what has presented like esophageal spasms which she has taken Levsin for in the past with some improvement.  She is not currently on any opiates.  I discussed the case with Dr. Levon Hedger who felt that we could schedule patient for repeat upper endoscopy with dilation with large dilator.  Will put her scheduled for this.  Constipation: Well-managed with Benefiber 1-2 times per day.  She did recently start [facility] which she knows may affect her bowel habits though so far doing okay.  She tried to do more fruits in her diet and drinks water all day long.  No issues or constipation at this time.  She continue with current bowel regimen.Marland Kitchen   PLAN:  -continue protonix 40mg  daily  -continue levsin PRN for esophageal/rectal spasms -Increase water intake, aim for atleast 64 oz per day -Increase fruits, veggies and whole grains, kiwi and prunes are especially good for constipation -continue benefiber 1-2 T daily -schedule EGD +dilation with large dilator, ASA II   All questions were answered, patient verbalized understanding and is in agreement with plan as outlined above.   Follow Up: 6 months   Carolyn Leider L. Jeanmarie Hubert, MSN, APRN, AGNP-C Adult-Gerontology Nurse Practitioner [facility] . I have reviewed the note and agree with the APP's assessment as described in this progress note  [name], MD Gastroenterology and Hepatology [facility]astroenterology

## 2024-01-02 NOTE — Telephone Encounter (Signed)
 Pt added to wait list.

## 2024-01-02 NOTE — Telephone Encounter (Signed)
 Copied from CRM [id number]. Topic: General - Other >> Jan 01, 2024  5:59 PM Eunice Blase wrote: Reason for CRM: , Pt called to convert to video. It would not allow me to convert. Please call pt at [phone].

## 2024-01-02 NOTE — Patient Instructions (Signed)
-  continue protonix 40mg  daily  -continue levsin as needed for esophageal/rectal spasms -Increase water intake, aim for atleast 64 oz per day -Increase fruits, veggies and whole grains, kiwi and prunes are especially good for constipation -continue benefiber 1-2 T daily -I will discuss next steps in regard to your swallowing with Dr. Levon Hedger, I will be in touch after I speak with him  Follow up 4 months  It was a pleasure to see you today. I want to create trusting relationships with patients and provide genuine, compassionate, and quality care. I truly value your feedback! please be on the lookout for a survey regarding your visit with me today. I appreciate your input about our visit and your time in completing this!    Carolyn [name] L. Jeanmarie Hubert, MSN, APRN, AGNP-C Adult-Gerontology Nurse Practitioner [facility]

## 2024-01-02 NOTE — Telephone Encounter (Signed)
 Called patient 4x to schedule overbook appointment with Dr. Allena Katz on 01/03/24 in the afternoon.  Left voicemail to return call to schedule appointment.

## 2024-01-02 NOTE — H&P (View-Only) (Signed)
 Referring Provider: Anabel Halon, MD Primary Care Physician:  Anabel Halon, MD Primary GI Physician: Dr. Levon Hedger   Chief Complaint  Patient presents with   Follow-up    Patient here today for a follow up on Constipation. She is still having issues with Constipation. She is taking benefiber once to twice per day, she is also eating a high fiber diet.   HPI:   Carolyn [name] is a 73 y.o. female with past medical history of DM, depression HLD, Myasthenia Gravis and IBS   Patient presenting today for follow up of: Constipation GERD Esophageal/spasms  Last seen August 2024, at that time patient reported esophageal spasms occurring less frequent than before. Taken hyoscyamine a few times which seem to help though not taking it currently.  Taking Protonix 40 mg daily, occasional breakthrough heartburn usually with trigger foods.  Having some dysphagia few times per week.  Constipation pretty well-managed and Benefiber 1-2 times per week, sometimes uses move tea, blueberries or prune juice to help with feeling constipated.  Upcoming esophageal manometry in September.  Present: Patient is maintained on pantoprazole 40mg  once daily. Having rare breakthrough symptoms but note she is having more episodes of dysphagia. She notes that she had to be off of her protonix for a few weeks as she had an issues with moving pharmacies. She notes that dysphagia felt worse while off the PPI but has not had much improvement since being back on her medication. She is having issues with dysphagia almost daily, even with water. She does note that she felt some improvement after last EGD. She has some occasional upper abdominal pain. No nausea or vomiting.   Constipation is manageable. She is taking benefiber 1- 2 times per day based on how she is feeling. She did recently go on mounjaro which she knows can affect her bowel habits. She does note a decline in appetite since starting this and is trying to eat  atleast 3 times per day even if just a snack.  She is usually having a BM about 5 days out of the week. Doing more fruits with blueberries, kiwi and prunes as needed. She drinks water all throughout the day.    Does levsin on occasion for rectal spasms, needs this only 2-3 times per month. She has occasional esophageal spasms but notes she forgot she could also use levsin for this.   pH impedence testing 07/2023:The patient has a normal esophageal acid exposure time for a patient off medication measured by pH according to the [place] Consensus.  There is a normal reflux frequency measured by impedance. Symptom indices  as outlined above. The clinical significance of positive indices in a  normal study is of unknown clinical significance and could suggest  visceral hypersensitivity.  esophageal manometry 07/2023: This is an abnormal esophageal motility study with a  hypertensive basal LES tone and an elevated median IRP.  There is evidnce  of hypercontractile peristalsis.. The IRP is elevated in both the primary  (supine) and secondary (upright) position. 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 Of note, there is an elevated UES residual pressure.  RUQ [place] in June 2024 with hepatic steatosis BPE: 03/2023 abnormal esophageal contractions, recommended to continue with esophageal manometry  Last Colonoscopy: 11/11/2020, normal colon with presence of internal hemorrhoids Last Endoscopy: 03/2023 Abnormal esophageal motility, suspicious for  esophageal spasm. Dilated.                           - Normal stomach.                           - Normal examined duodenum.                           - 1 cm hiatal hernia.                           - No specimens collected.   Filed Weights   01/02/24 1010  Weight: 185 lb 12.8 oz (84.3 kg)     Past  Medical History:  Diagnosis Date   Depression    Diabetes mellitus without complication (HCC)    Diastolic dysfunction    Hyperlipidemia    Myasthenia gravis (HCC)    Patient is Jehovah's Witness    Pulmonary embolism (HCC) 05/18/2021   Sleep apnea     Past Surgical History:  Procedure Laterality Date   APPENDECTOMY     BIOPSY  11/11/2020   Procedure: BIOPSY;  Surgeon: [name], MD;  [facility];  Service: Gastroenterology;;  duodenum gastric   BUNIONECTOMY     COLONOSCOPY WITH PROPOFOL N/A 11/11/2020   Procedure: COLONOSCOPY WITH PROPOFOL;  Surgeon: [name], MD;  [facility];  Service: Gastroenterology;  Laterality: N/A;  11:15   ESOPHAGEAL DILATION  03/23/2023   Procedure: ESOPHAGEAL DILATION;  Surgeon: Marguerita Merles, Reuel Boom, MD;  [facility];  Service: Gastroenterology;;   ESOPHAGOGASTRODUODENOSCOPY (EGD) WITH PROPOFOL N/A 11/11/2020   Procedure: ESOPHAGOGASTRODUODENOSCOPY (EGD) WITH PROPOFOL;  Surgeon: [name], MD;  [facility];  Service: Gastroenterology;  Laterality: N/A;  to arrive at 0845   ESOPHAGOGASTRODUODENOSCOPY (EGD) WITH PROPOFOL N/A 03/23/2023   Procedure: ESOPHAGOGASTRODUODENOSCOPY (EGD) WITH PROPOFOL;  Surgeon: [name], MD;  [facility];  Service: Gastroenterology;  Laterality: N/A;  7:30am;asa 2   MANDIBLE SURGERY     TONSILECTOMY, ADENOIDECTOMY, BILATERAL MYRINGOTOMY AND TUBES      Current Outpatient Medications  Medication Sig Dispense Refill   Blood Glucose Monitoring Suppl (BLOOD GLUCOSE SYSTEM PAK) KIT Use as directed to monitor FSBS 2x daily. Dx: E11.9 1 each 1   buPROPion (WELLBUTRIN SR) 200 MG 12 hr tablet Take 1 tablet (200 mg total) by mouth daily. 30 tablet 5   Cholecalciferol (VITAMIN D3) 125 MCG (5000 UT) TABS Take 5,000 Units by mouth daily.     Coenzyme Q10 (CO Q 10 PO) Take 1 capsule by mouth daily.     FARXIGA 10 MG  TABS tablet TAKE 1 TABLET(10 MG) BY MOUTH DAILY BEFORE BREAKFAST 90 tablet 2   glucose blood (ONETOUCH VERIO) test strip USE TO TEST BLOOD SUGAR TWICE DAILY 100 strip 3   Glycerin-Hypromellose-PEG 400 (DRY EYE RELIEF DROPS) 0.2-0.2-1 % SOLN Place 1 drop into both eyes daily as needed (Dry eye). homeopathic     hyoscyamine (LEVSIN SL) 0.125 MG SL tablet Place 1 tablet (0.125 mg total) under the tongue every 6 (six) hours as needed (spasms). 60 tablet 1   losartan-hydrochlorothiazide (HYZAAR) 100-12.5 MG tablet Take 1 tablet by mouth daily. 90 tablet 1   MAGNESIUM PO Take 2 tablets by mouth daily at 2 am.  metoprolol succinate (TOPROL-XL) 25 MG 24 hr tablet Take 1 tablet (25 mg total) by mouth daily. 90 tablet 1   Multiple Vitamins-Minerals (MULTI COMPLETE) CAPS Take 1 capsule by mouth daily.     naproxen (NAPROSYN) 500 MG tablet Take 1 tablet (500 mg total) by mouth 2 (two) times daily with a meal. 60 tablet 2   pantoprazole (PROTONIX) 40 MG tablet Take 1 tablet (40 mg total) by mouth daily. 90 tablet 3   rosuvastatin (CRESTOR) 10 MG tablet Take 1 tablet (10 mg total) by mouth daily. 90 tablet 1   tirzepatide (MOUNJARO) 2.5 MG/0.5ML Pen Inject 2.5 mg into the skin once a week. 2 mL 0   VASCEPA 1 g capsule Take 2 capsules (2 g total) by mouth 2 (two) times daily. 360 capsule 3   Wheat Dextrin (BENEFIBER PO) Take by mouth. Takes one tablespoon tid     No current facility-administered medications for this visit.    Allergies as of 01/02/2024 - Review Complete 01/02/2024  Allergen Reaction Noted   Prednisolone acetate-nepafenac Other (See Comments) 10/18/2023   Prozac [fluoxetine hcl] Anaphylaxis 12/16/2015   Codeine Other (See Comments) and Nausea And Vomiting 12/16/2015   Erythromycin Other (See Comments) 12/16/2015   Fluoxetine Hives 10/18/2023   Other  10/30/2017   Sulfa antibiotics Hives 12/16/2015    Social History   Socioeconomic History   Marital status: Married    Spouse name:  Casimiro Needle   Number of children: 4   Years of education: some college   Highest education level: Some college, no degree  Occupational History   Occupation: Retired  Tobacco Use   Smoking status: Never    Passive exposure: Never   Smokeless tobacco: Never  Vaping Use   Vaping status: Never Used  Substance and Sexual Activity   Alcohol use: Yes    Comment: socially on rare occasions   Drug use: No   Sexual activity: Yes    Birth control/protection: Other-see comments    Comment: postmenopausal  Other Topics Concern   Not on file  Social History Narrative   Not on file   Social Drivers of Health   Financial Resource Strain: Low Risk  (11/02/2023)   Overall Financial Resource Strain (CARDIA)    Difficulty of Paying Living Expenses: Not hard at all  Food Insecurity: No Food Insecurity (11/02/2023)   Hunger Vital Sign    Worried About Running Out of Food in the Last Year: Never true    Ran Out of Food in the Last Year: Never true  Transportation Needs: No Transportation Needs (11/02/2023)   PRAPARE - Administrator, Civil Service (Medical): No    Lack of Transportation (Non-Medical): No  Physical Activity: Insufficiently Active (11/02/2023)   Exercise Vital Sign    Days of Exercise per Week: 2 days    Minutes of Exercise per Session: 20 min  Stress: No Stress Concern Present (11/02/2023)   [employer] of Occupational Health - Occupational Stress Questionnaire    Feeling of Stress : Only a little  Social Connections: Moderately Integrated (11/02/2023)   Social Connection and Isolation Panel [NHANES]    Frequency of Communication with Friends and Family: More than three times a week    Frequency of Social Gatherings with Friends and Family: Three times a week    Attends Religious Services: More than 4 times per year    Active Member of Clubs or Organizations: No    Attends Banker Meetings: Not on  file    Marital Status: Married    Review of  systems General: negative for malaise, night sweats, fever, chills, weight los Neck: Negative for lumps, goiter, pain and significant neck swelling Resp: Negative for cough, wheezing, dyspnea at rest CV: Negative for chest pain, leg swelling, palpitations, orthopnea GI: denies melena, hematochezia, nausea, vomiting, diarrhea, constipation, odyonophagia, early satiety or unintentional weight loss. +dysphagia  The remainder of the review of systems is noncontributory.  Physical Exam: BP 107/72 (BP Location: Left Arm, Patient Position: Sitting, Cuff Size: Large)   Pulse 79   Temp (!) 97.2 F (36.2 C) (Temporal)   Ht 5\' 2"  (1.575 m)   Wt 185 lb 12.8 oz (84.3 kg)   BMI 33.98 kg/m  General:   Alert and oriented. No distress noted. Pleasant and cooperative.  Head:  Normocephalic and atraumatic. Eyes:  Conjuctiva clear without scleral icterus. Mouth:  Oral mucosa pink and moist. Good dentition. No lesions. Heart: Normal rate and rhythm, s1 and s2 heart sounds present.  Lungs: Clear lung sounds in all lobes. Respirations equal and unlabored. Abdomen:  +BS, soft, and non-distended. TTP of mid epigastric to RUQ area. No rebound or guarding. No HSM or masses noted. Neurologic:  Alert and  oriented x4 Psych:  Alert and cooperative. Normal mood and affect.  Invalid input(s): "6 MONTHS"   ASSESSMENT: Carolyn [name] is a 73 y.o. female presenting today for follow up of GERD, dysphagia and constipation  GERD: Denies any breakthrough heartburn or acid regurgitation on pantoprazole 40 mg daily.  Continue with current PPI regimen  Dysphagia: Patient continues to experience dysphagia and notes this has been worse recently.  Fairly recent EGD in May 2024 as well as BPE around that time with evidence of esophageal dysmotility.  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   She does have history of what has presented like esophageal spasms which she has taken Levsin for in the past with some improvement.  She is not currently on any opiates.  I discussed the case with Dr. Levon Hedger who felt that we could schedule patient for repeat upper endoscopy with dilation with large dilator.  Will put her scheduled for this.  Constipation: Well-managed with Benefiber 1-2 times per day.  She did recently start [facility] which she knows may affect her bowel habits though so far doing okay.  She tried to do more fruits in her diet and drinks water all day long.  No issues or constipation at this time.  She continue with current bowel regimen.Marland Kitchen   PLAN:  -continue protonix 40mg  daily  -continue levsin PRN for esophageal/rectal spasms -Increase water intake, aim for atleast 64 oz per day -Increase fruits, veggies and whole grains, kiwi and prunes are especially good for constipation -continue benefiber 1-2 T daily -schedule EGD +dilation with large dilator, ASA II   All questions were answered, patient verbalized understanding and is in agreement with plan as outlined above.   Follow Up: 6 months   Harjit Leider L. Jeanmarie Hubert, MSN, APRN, AGNP-C Adult-Gerontology Nurse Practitioner [facility] . I have reviewed the note and agree with the APP's assessment as described in this progress note  [name], MD Gastroenterology and Hepatology [facility] Gastroenterology

## 2024-01-03 ENCOUNTER — Encounter: Payer: Self-pay | Admitting: Internal Medicine

## 2024-01-03 ENCOUNTER — Ambulatory Visit (INDEPENDENT_AMBULATORY_CARE_PROVIDER_SITE_OTHER): Admitting: Internal Medicine

## 2024-01-03 VITALS — BP 133/77 | HR 85 | Ht 62.0 in | Wt 185.4 lb

## 2024-01-03 DIAGNOSIS — H532 Diplopia: Secondary | ICD-10-CM | POA: Insufficient documentation

## 2024-01-03 DIAGNOSIS — H5711 Ocular pain, right eye: Secondary | ICD-10-CM | POA: Diagnosis not present

## 2024-01-03 NOTE — Progress Notes (Signed)
 Pt contacted and scheduled for 01/24/24 at 9 am. Instructions mailed to pt. No PA needed per insurance.

## 2024-01-03 NOTE — Assessment & Plan Note (Addendum)
 Considering recent onset double vision with unilateral eye pain, could be glaucoma (?) - referred to ophthalmology CN II-XII intact, no focal neurological deficit Advised to avoid applying any new eyedrops for now

## 2024-01-03 NOTE — Patient Instructions (Addendum)
 You are being referred to [facility].  [address], [place], [place] [address] [phone]

## 2024-01-03 NOTE — Addendum Note (Signed)
 Addended by: [name] on: 01/03/2024 08:24 AM   Modules accepted: Orders

## 2024-01-03 NOTE — Progress Notes (Signed)
 I was able to get her in at the end of March:)

## 2024-01-03 NOTE — Progress Notes (Signed)
 Acute Office Visit  Subjective:    Patient ID: Carolyn [name], female    DOB: April 23, 1951, 73 y.o.   MRN: [id number]  Chief Complaint  Patient presents with   Diplopia    Double vision     HPI Patient is in today for complaint of right-sided eye pain and double vision for the last 2 weeks.  She denies any eye redness or watering currently.  Denies any recent worsening of nasal congestion, sinus pressure related headache or postnasal drip.  Denies any fever or chills.  Does not report any new focal neurologic deficits, such as numbness or tingling of UE or LE.  She has history of cataract surgeries bilaterally. She used to see Dr Nile Riggs, but does not have Ophthalmologist currently.  Past Medical History:  Diagnosis Date   Depression    Diabetes mellitus without complication (HCC)    Diastolic dysfunction    Hyperlipidemia    Myasthenia gravis (HCC)    Patient is Jehovah's Witness    Pulmonary embolism (HCC) 05/18/2021   Sleep apnea     Past Surgical History:  Procedure Laterality Date   APPENDECTOMY     BIOPSY  11/11/2020   Procedure: BIOPSY;  Surgeon: [name], MD;  [facility];  Service: Gastroenterology;;  duodenum gastric   BUNIONECTOMY     COLONOSCOPY WITH PROPOFOL N/A 11/11/2020   Procedure: COLONOSCOPY WITH PROPOFOL;  Surgeon: [name], MD;  [facility];  Service: Gastroenterology;  Laterality: N/A;  11:15   ESOPHAGEAL DILATION  03/23/2023   Procedure: ESOPHAGEAL DILATION;  Surgeon: Marguerita Merles, Reuel Boom, MD;  [facility];  Service: Gastroenterology;;   ESOPHAGOGASTRODUODENOSCOPY (EGD) WITH PROPOFOL N/A 11/11/2020   Procedure: ESOPHAGOGASTRODUODENOSCOPY (EGD) WITH PROPOFOL;  Surgeon: [name], MD;  [facility];  Service: Gastroenterology;  Laterality: N/A;  to arrive at 0845   ESOPHAGOGASTRODUODENOSCOPY (EGD) WITH PROPOFOL N/A 03/23/2023   Procedure:  ESOPHAGOGASTRODUODENOSCOPY (EGD) WITH PROPOFOL;  Surgeon: [name], MD;  [facility];  Service: Gastroenterology;  Laterality: N/A;  7:30am;asa 2   MANDIBLE SURGERY     TONSILECTOMY, ADENOIDECTOMY, BILATERAL MYRINGOTOMY AND TUBES      Family History  Problem Relation Age of Onset   Arthritis Mother    Depression Mother    Diabetes Mother    Hypertension Mother    Alcohol abuse Father    Hypertension Father    Heart disease Father 88       Massive MI   Diabetes Brother    Diabetes Brother    Heart disease Brother    Diabetes Brother        Type I     Social History   Socioeconomic History   Marital status: Married    Spouse name: Casimiro Needle   Number of children: 4   Years of education: some college   Highest education level: Some college, no degree  Occupational History   Occupation: Retired  Tobacco Use   Smoking status: Never    Passive exposure: Never   Smokeless tobacco: Never  Vaping Use   Vaping status: Never Used  Substance and Sexual Activity   Alcohol use: Yes    Comment: socially on rare occasions   Drug use: No   Sexual activity: Yes    Birth control/protection: Other-see comments    Comment: postmenopausal  Other Topics Concern   Not on file  Social History Narrative   Not on file   Social  Drivers of Health   Financial Resource Strain: Low Risk  (11/02/2023)   Overall Financial Resource Strain (CARDIA)    Difficulty of Paying Living Expenses: Not hard at all  Food Insecurity: No Food Insecurity (11/02/2023)   Hunger Vital Sign    Worried About Running Out of Food in the Last Year: Never true    Ran Out of Food in the Last Year: Never true  Transportation Needs: No Transportation Needs (11/02/2023)   PRAPARE - Administrator, Civil Service (Medical): No    Lack of Transportation (Non-Medical): No  Physical Activity: Insufficiently Active (11/02/2023)   Exercise Vital Sign    Days of Exercise per Week: 2 days     Minutes of Exercise per Session: 20 min  Stress: No Stress Concern Present (11/02/2023)   [employer] of Occupational Health - Occupational Stress Questionnaire    Feeling of Stress : Only a little  Social Connections: Moderately Integrated (11/02/2023)   Social Connection and Isolation Panel [NHANES]    Frequency of Communication with Friends and Family: More than three times a week    Frequency of Social Gatherings with Friends and Family: Three times a week    Attends Religious Services: More than 4 times per year    Active Member of Clubs or Organizations: No    Attends Banker Meetings: Not on file    Marital Status: Married  Intimate Partner Violence: Not At Risk (02/10/2021)   Humiliation, Afraid, Rape, and Kick questionnaire    Fear of Current or Ex-Partner: No    Emotionally Abused: No    Physically Abused: No    Sexually Abused: No    Outpatient Medications Prior to Visit  Medication Sig Dispense Refill   Blood Glucose Monitoring Suppl (BLOOD GLUCOSE SYSTEM PAK) KIT Use as directed to monitor FSBS 2x daily. Dx: E11.9 1 each 1   buPROPion (WELLBUTRIN SR) 200 MG 12 hr tablet Take 1 tablet (200 mg total) by mouth daily. 30 tablet 5   Cholecalciferol (VITAMIN D3) 125 MCG (5000 UT) TABS Take 5,000 Units by mouth daily.     Coenzyme Q10 (CO Q 10 PO) Take 1 capsule by mouth daily.     FARXIGA 10 MG TABS tablet TAKE 1 TABLET(10 MG) BY MOUTH DAILY BEFORE BREAKFAST 90 tablet 2   glucose blood (ONETOUCH VERIO) test strip USE TO TEST BLOOD SUGAR TWICE DAILY 100 strip 3   Glycerin-Hypromellose-PEG 400 (DRY EYE RELIEF DROPS) 0.2-0.2-1 % SOLN Place 1 drop into both eyes daily as needed (Dry eye). homeopathic     hyoscyamine (LEVSIN SL) 0.125 MG SL tablet Place 1 tablet (0.125 mg total) under the tongue every 6 (six) hours as needed (spasms). 60 tablet 1   losartan-hydrochlorothiazide (HYZAAR) 100-12.5 MG tablet Take 1 tablet by mouth daily. 90 tablet 1   MAGNESIUM PO Take 2  tablets by mouth daily at 2 am.     metoprolol succinate (TOPROL-XL) 25 MG 24 hr tablet Take 1 tablet (25 mg total) by mouth daily. 90 tablet 1   Multiple Vitamins-Minerals (MULTI COMPLETE) CAPS Take 1 capsule by mouth daily.     naproxen (NAPROSYN) 500 MG tablet Take 1 tablet (500 mg total) by mouth 2 (two) times daily with a meal. 60 tablet 2   pantoprazole (PROTONIX) 40 MG tablet Take 1 tablet (40 mg total) by mouth daily. 90 tablet 3   rosuvastatin (CRESTOR) 10 MG tablet Take 1 tablet (10 mg total) by mouth daily. 90 tablet  1   tirzepatide (MOUNJARO) 2.5 MG/0.5ML Pen Inject 2.5 mg into the skin once a week. 2 mL 0   VASCEPA 1 g capsule Take 2 capsules (2 g total) by mouth 2 (two) times daily. 360 capsule 3   Wheat Dextrin (BENEFIBER PO) Take by mouth. Takes one tablespoon tid     No facility-administered medications prior to visit.    Allergies  Allergen Reactions   Prednisolone Acetate-Nepafenac Other (See Comments)    1% eye solution Causes eye pain and severe headaches   Prozac [Fluoxetine Hcl] Anaphylaxis   Codeine Other (See Comments) and Nausea And Vomiting    Intolerance   Erythromycin Other (See Comments)   Fluoxetine Hives   Other     -mycin medications: has myasthenia gravis    Sulfa Antibiotics Hives    Review of Systems  Constitutional:  Negative for chills and fever.  HENT:  Negative for congestion, sinus pressure, sinus pain and sore throat.   Eyes:  Positive for pain (Right eye) and visual disturbance. Negative for discharge.  Respiratory:  Negative for cough and shortness of breath.   Cardiovascular:  Negative for chest pain and palpitations.  Gastrointestinal:  Negative for diarrhea, nausea and vomiting.  Endocrine: Negative for polydipsia and polyuria.  Genitourinary:  Negative for dysuria and hematuria.  Musculoskeletal:  Positive for back pain. Negative for neck pain and neck stiffness.       R shoulder pain  Skin:  Negative for rash.  Neurological:   Negative for dizziness and weakness.  Psychiatric/Behavioral:  Positive for sleep disturbance. Negative for agitation and behavioral problems. The patient is nervous/anxious.        Objective:    Physical Exam Vitals reviewed.  Constitutional:      General: She is not in acute distress.    Appearance: She is not diaphoretic.  HENT:     Head: Normocephalic and atraumatic.     Nose: Nose normal.     Mouth/Throat:     Mouth: Mucous membranes are moist.  Eyes:     General: No scleral icterus.       Right eye: No discharge.        Left eye: No discharge.     Extraocular Movements: Extraocular movements intact.     Pupils: Pupils are equal, round, and reactive to light.  Cardiovascular:     Rate and Rhythm: Normal rate and regular rhythm.     Pulses: Normal pulses.     Heart sounds: Normal heart sounds. No murmur heard. Pulmonary:     Breath sounds: Normal breath sounds. No wheezing or rales.  Musculoskeletal:     Cervical back: Neck supple. No tenderness.     Lumbar back: Tenderness present. Decreased range of motion.     Right lower leg: No edema.     Left lower leg: No edema.  Skin:    General: Skin is warm.     Findings: No rash.  Neurological:     General: No focal deficit present.     Mental Status: She is alert and oriented to person, place, and time.     Cranial Nerves: No cranial nerve deficit.     Sensory: No sensory deficit.     Motor: No weakness.  Psychiatric:        Mood and Affect: Mood normal.        Behavior: Behavior is slowed. Behavior is cooperative.     BP 133/77 (BP Location: Right Arm, Patient Position: Sitting, Cuff Size:  Normal)   Pulse 85   Ht 5\' 2"  (1.575 m)   Wt 185 lb 6.4 oz (84.1 kg)   SpO2 96%   BMI 33.91 kg/m  Wt Readings from Last 3 Encounters:  01/03/24 185 lb 6.4 oz (84.1 kg)  01/02/24 185 lb 12.8 oz (84.3 kg)  12/18/23 183 lb 9.6 oz (83.3 kg)        Assessment & Plan:   Problem List Items Addressed This Visit        Other   Double vision   Considering recent onset double vision with unilateral eye pain, could be glaucoma (?) - referred to ophthalmology CN II-XII intact, no focal neurological deficit Advised to avoid applying any new eyedrops for now      Relevant Orders   Ambulatory referral to Ophthalmology   Other Visit Diagnoses       Eye pain, right    -  Primary   Relevant Orders   Ambulatory referral to Ophthalmology        No orders of the defined types were placed in this encounter.    Anabel Halon, MD

## 2024-01-04 ENCOUNTER — Ambulatory Visit: Admitting: Internal Medicine

## 2024-01-05 LAB — HM DIABETES EYE EXAM

## 2024-01-08 ENCOUNTER — Encounter: Payer: Self-pay | Admitting: Internal Medicine

## 2024-01-09 ENCOUNTER — Other Ambulatory Visit: Payer: Self-pay | Admitting: Internal Medicine

## 2024-01-09 ENCOUNTER — Other Ambulatory Visit (HOSPITAL_COMMUNITY)
Admission: RE | Admit: 2024-01-09 | Discharge: 2024-01-09 | Disposition: A | Discharge status: Home / Self Care | Source: Ambulatory Visit | Attending: Physician Assistant | Admitting: Physician Assistant

## 2024-01-09 DIAGNOSIS — H532 Diplopia: Secondary | ICD-10-CM | POA: Insufficient documentation

## 2024-01-09 DIAGNOSIS — R519 Headache, unspecified: Secondary | ICD-10-CM | POA: Insufficient documentation

## 2024-01-09 DIAGNOSIS — E1169 Type 2 diabetes mellitus with other specified complication: Secondary | ICD-10-CM

## 2024-01-09 LAB — CBC
HCT: 46 % (ref 36.0–46.0)
Hemoglobin: 15.9 g/dL — ABNORMAL HIGH (ref 12.0–15.0)
MCH: 31.7 pg (ref 26.0–34.0)
MCHC: 34.6 g/dL (ref 30.0–36.0)
MCV: 91.8 fL (ref 80.0–100.0)
Platelets: 218 10*3/uL (ref 150–400)
RBC: 5.01 MIL/uL (ref 3.87–5.11)
RDW: 12.6 % (ref 11.5–15.5)
WBC: 6.2 10*3/uL (ref 4.0–10.5)
nRBC: 0 % (ref 0.0–0.2)

## 2024-01-09 LAB — C-REACTIVE PROTEIN: CRP: 0.8 mg/dL (ref <1.0)

## 2024-01-09 LAB — SEDIMENTATION RATE: Sed Rate: 6 mm/h (ref 0–22)

## 2024-01-09 MED ORDER — TIRZEPATIDE 5 MG/0.5ML ~~LOC~~ SOAJ: 5.0000 mg | SUBCUTANEOUS | 1 refills | Status: DC

## 2024-01-11 DIAGNOSIS — G4733 Obstructive sleep apnea (adult) (pediatric): Secondary | ICD-10-CM

## 2024-01-15 ENCOUNTER — Other Ambulatory Visit: Payer: Self-pay

## 2024-01-15 DIAGNOSIS — G4733 Obstructive sleep apnea (adult) (pediatric): Secondary | ICD-10-CM

## 2024-01-17 ENCOUNTER — Telehealth: Payer: Self-pay | Admitting: Adult Health

## 2024-01-17 ENCOUNTER — Ambulatory Visit: Payer: Medicare Other | Admitting: Nutrition

## 2024-01-17 NOTE — Telephone Encounter (Signed)
 Routing to [facility] to check status of CPAP supplies request.

## 2024-01-17 NOTE — Telephone Encounter (Signed)
 Patient states she has been trying since November to get new CPAP supplies from [facility] and she keeps being told they have not received any requests from our office---items needed---Sleep study report,prescription for supplies and most recent office visit note---fax to [phone]---phone note from December 2024    Patient call back is [phone] [employer] with some one at [employer]  she said that she did see that that order had been received and that she will have Brad to give [place] call back regarding this pt order.        Electronically signed by Annabell Sabal, CMA at 10/16/2023  4:40 PM

## 2024-01-19 ENCOUNTER — Other Ambulatory Visit (HOSPITAL_COMMUNITY)
Admission: RE | Admit: 2024-01-19 | Discharge: 2024-01-19 | Disposition: A | Discharge status: Home / Self Care | Source: Ambulatory Visit | Attending: Gastroenterology | Admitting: Gastroenterology

## 2024-01-19 DIAGNOSIS — R1310 Dysphagia, unspecified: Secondary | ICD-10-CM | POA: Insufficient documentation

## 2024-01-19 LAB — BASIC METABOLIC PANEL
Anion gap: 10 (ref 5–15)
BUN: 19 mg/dL (ref 8–23)
CO2: 25 mmol/L (ref 22–32)
Calcium: 9.5 mg/dL (ref 8.9–10.3)
Chloride: 103 mmol/L (ref 98–111)
Creatinine, Ser: 0.87 mg/dL (ref 0.44–1.00)
GFR, Estimated: >60 mL/min (ref >60)
Glucose, Bld: 175 mg/dL — ABNORMAL HIGH (ref 70–99)
Potassium: 3.7 mmol/L (ref 3.5–5.1)
Sodium: 138 mmol/L (ref 135–145)

## 2024-01-19 NOTE — Telephone Encounter (Signed)
 Spoke with Nida Boatman at [employer] had been sent to the rep to ask details about pts machine so they could fill order. Nida Boatman will be contacting rep to contact pt to get her supplies. NFN

## 2024-01-24 ENCOUNTER — Other Ambulatory Visit: Payer: Self-pay | Admitting: Gastroenterology

## 2024-01-24 ENCOUNTER — Telehealth (INDEPENDENT_AMBULATORY_CARE_PROVIDER_SITE_OTHER): Payer: Self-pay

## 2024-01-24 ENCOUNTER — Ambulatory Visit (HOSPITAL_COMMUNITY): Admitting: Anesthesiology

## 2024-01-24 ENCOUNTER — Ambulatory Visit (HOSPITAL_COMMUNITY)
Admission: RE | Admit: 2024-01-24 | Discharge: 2024-01-24 | Disposition: A | Discharge status: Home / Self Care | Attending: Gastroenterology | Admitting: Gastroenterology

## 2024-01-24 ENCOUNTER — Encounter (HOSPITAL_COMMUNITY)
Admission: RE | Disposition: A | Discharge status: Home / Self Care | Payer: Self-pay | Source: Home / Self Care | Attending: Gastroenterology

## 2024-01-24 ENCOUNTER — Other Ambulatory Visit: Payer: Self-pay

## 2024-01-24 ENCOUNTER — Other Ambulatory Visit: Payer: Self-pay | Admitting: Internal Medicine

## 2024-01-24 ENCOUNTER — Ambulatory Visit (HOSPITAL_BASED_OUTPATIENT_CLINIC_OR_DEPARTMENT_OTHER): Admitting: Anesthesiology

## 2024-01-24 ENCOUNTER — Encounter (HOSPITAL_COMMUNITY): Payer: Self-pay | Admitting: Gastroenterology

## 2024-01-24 DIAGNOSIS — G4730 Sleep apnea, unspecified: Secondary | ICD-10-CM | POA: Diagnosis not present

## 2024-01-24 DIAGNOSIS — Z7984 Long term (current) use of oral hypoglycemic drugs: Secondary | ICD-10-CM | POA: Diagnosis not present

## 2024-01-24 DIAGNOSIS — F418 Other specified anxiety disorders: Secondary | ICD-10-CM | POA: Diagnosis not present

## 2024-01-24 DIAGNOSIS — K760 Fatty (change of) liver, not elsewhere classified: Secondary | ICD-10-CM | POA: Diagnosis not present

## 2024-01-24 DIAGNOSIS — F32A Depression, unspecified: Secondary | ICD-10-CM | POA: Diagnosis not present

## 2024-01-24 DIAGNOSIS — G709 Myoneural disorder, unspecified: Secondary | ICD-10-CM | POA: Insufficient documentation

## 2024-01-24 DIAGNOSIS — R1310 Dysphagia, unspecified: Secondary | ICD-10-CM | POA: Diagnosis present

## 2024-01-24 DIAGNOSIS — K589 Irritable bowel syndrome without diarrhea: Secondary | ICD-10-CM | POA: Insufficient documentation

## 2024-01-24 DIAGNOSIS — K219 Gastro-esophageal reflux disease without esophagitis: Secondary | ICD-10-CM | POA: Insufficient documentation

## 2024-01-24 DIAGNOSIS — Z79899 Other long term (current) drug therapy: Secondary | ICD-10-CM | POA: Insufficient documentation

## 2024-01-24 DIAGNOSIS — Z7985 Long-term (current) use of injectable non-insulin antidiabetic drugs: Secondary | ICD-10-CM | POA: Insufficient documentation

## 2024-01-24 DIAGNOSIS — G7000 Myasthenia gravis without (acute) exacerbation: Secondary | ICD-10-CM | POA: Diagnosis not present

## 2024-01-24 DIAGNOSIS — E785 Hyperlipidemia, unspecified: Secondary | ICD-10-CM | POA: Insufficient documentation

## 2024-01-24 DIAGNOSIS — K224 Dyskinesia of esophagus: Secondary | ICD-10-CM | POA: Diagnosis not present

## 2024-01-24 DIAGNOSIS — E119 Type 2 diabetes mellitus without complications: Secondary | ICD-10-CM | POA: Diagnosis not present

## 2024-01-24 DIAGNOSIS — I10 Essential (primary) hypertension: Secondary | ICD-10-CM | POA: Diagnosis not present

## 2024-01-24 HISTORY — PX: ESOPHAGEAL DILATION: SHX303

## 2024-01-24 HISTORY — PX: ESOPHAGOGASTRODUODENOSCOPY: SHX5428

## 2024-01-24 LAB — GLUCOSE, CAPILLARY: Glucose-Capillary: 108 mg/dL — ABNORMAL HIGH (ref 70–99)

## 2024-01-24 SURGERY — EGD (ESOPHAGOGASTRODUODENOSCOPY)
Anesthesia: General

## 2024-01-24 MED ORDER — PROPOFOL 10 MG/ML IV BOLUS
INTRAVENOUS | Status: DC | PRN
  Administered 2024-01-24: 50 mg via INTRAVENOUS
  Administered 2024-01-24: 100 mg via INTRAVENOUS

## 2024-01-24 MED ORDER — LACTATED RINGERS IV SOLN: INTRAVENOUS | Status: DC

## 2024-01-24 MED ORDER — PHENYLEPHRINE 80 MCG/ML (10ML) SYRINGE FOR IV PUSH (FOR BLOOD PRESSURE SUPPORT)
PREFILLED_SYRINGE | INTRAVENOUS | Status: DC | PRN
  Administered 2024-01-24: 160 ug via INTRAVENOUS

## 2024-01-24 MED ORDER — LIDOCAINE HCL (PF) 2 % IJ SOLN
INTRAMUSCULAR | Status: DC | PRN
  Administered 2024-01-24: 10 mg via INTRADERMAL

## 2024-01-24 MED ORDER — PROPOFOL 500 MG/50ML IV EMUL
INTRAVENOUS | Status: DC | PRN
  Administered 2024-01-24: 150 ug/kg/min via INTRAVENOUS

## 2024-01-24 NOTE — Anesthesia Procedure Notes (Signed)
 Date/Time: 01/24/2024 8:52 AM  Performed by: Julian Reil, CRNAPre-anesthesia Checklist: Patient identified, Emergency Drugs available, Suction available and Patient being monitored Patient Re-evaluated:Patient Re-evaluated prior to induction Oxygen Delivery Method: Nasal cannula Induction Type: IV induction Placement Confirmation: positive ETCO2 Comments: Optiflow High Flow [place] O2 used.

## 2024-01-24 NOTE — Telephone Encounter (Signed)
 Agree, thanks

## 2024-01-24 NOTE — Telephone Encounter (Signed)
Patient states understanding.

## 2024-01-24 NOTE — Discharge Instructions (Signed)
 You are being discharged to home.  Resume your previous diet.  Continue your present medications.

## 2024-01-24 NOTE — Op Note (Signed)
 [facility] Patient Name: Carolyn [name] Procedure Date: 01/24/2024 8:42 AM MRN: [id number] Date of Birth: May 15, 1951 Attending MD: [name] , , [id number] CSN: [id number] Age: 73 Admit Type: Outpatient Procedure:                Upper GI endoscopy Indications:              Dysphagia, EGJOO Providers:                [name], Francoise Ceo RN, RN, Dyann Ruddle Referring MD:              Medicines:                Monitored Anesthesia Care Complications:            No immediate complications. Estimated Blood Loss:     Estimated blood loss: none. Procedure:                Pre-Anesthesia Assessment:                           - Prior to the procedure, a History and Physical                            was performed, and patient medications, allergies                            and sensitivities were reviewed. The patient's                            tolerance of previous anesthesia was reviewed.                           - The risks and benefits of the procedure and the                            sedation options and risks were discussed with the                            patient. All questions were answered and informed                            consent was obtained.                           - ASA Grade Assessment: II - A patient with mild                            systemic disease.                           After obtaining informed consent, the endoscope was                            passed under direct vision. Throughout the                            procedure, the patient's blood  pressure, pulse, and                            oxygen saturations were monitored continuously. The                            GIF-H190 ([id number]) scope was introduced through the                            mouth, and advanced to the second part of duodenum.                            The upper GI endoscopy was accomplished without                            difficulty. The patient  tolerated the procedure                            well. Scope In: 8:57:07 AM Scope Out: 9:07:09 AM Total Procedure Duration: 0 hours 10 minutes 2 seconds  Findings:      The Z-line was regular and was found 39 cm from the incisors.      No endoscopic abnormality was evident in the esophagus to explain the       patient's complaint of dysphagia. It was decided, however, to proceed       with dilation of the entire esophagus. A guidewire was placed and the       scope was withdrawn. Dilation was performed with a Savary dilator. I       initially attempteed to dilate with a 20 mm dilator but it did not       advance all the way down smoothly, so I decided to switch to a 19 mm.       The dilation site was examined following endoscope reinsertion and       showed mild mucosal disruption in the upper esophagus.      The stomach was normal.      The examined duodenum was normal. Impression:               - Z-line regular, 39 cm from the incisors.                           - No endoscopic esophageal abnormality to explain                            patient's dysphagia. Esophagus dilated.                           - Normal stomach.                           - Normal examined duodenum.                           - No specimens collected. Moderate Sedation:      Per Anesthesia Care Recommendation:           - Discharge patient to home (ambulatory).                           -  Resume previous diet.                           - Continue present medications. Procedure Code(s):        --- Professional ---                           [id number], Esophagogastroduodenoscopy, flexible,                            transoral; with insertion of guide wire followed by                            passage of dilator(s) through esophagus over guide                            wire Diagnosis Code(s):        --- Professional ---                           R13.10, Dysphagia, unspecified CPT copyright 2022 American Medical  Association. All rights reserved. The codes documented in this report are preliminary and upon coder review may  be revised to meet current compliance requirements. [name], MD [name],  01/24/2024 9:13:52 AM This report has been signed electronically. Number of Addenda: 0

## 2024-01-24 NOTE — Telephone Encounter (Signed)
 Patient called today says she has a sore throat after EGD with Dilation from today. I advised that she can gargle with warm salt water, take tylenol as needed, suck on Cepacol lozenges to help numb the area. I advised to eat light such things as mashed potatoes,apple sauce, broths to help until the soreness is gone.

## 2024-01-24 NOTE — Interval H&P Note (Signed)
 History and Physical Interval Note:  01/24/2024 8:00 AM  Carolyn [name]  has presented today for surgery, with the diagnosis of DYSPHAGIA, EGJOO.  The various methods of treatment have been discussed with the patient and family. After consideration of risks, benefits and other options for treatment, the patient has consented to  Procedure(s) with comments: EGD (ESOPHAGOGASTRODUODENOSCOPY) (N/A) - 9:00AM;ASA 2 DILATION, ESOPHAGUS (N/A) - 9:00AM;ASA 2 as a surgical intervention.  The patient's history has been reviewed, patient examined, no change in status, stable for surgery.  I have reviewed the patient's chart and labs.  Questions were answered to the patient's satisfaction.     [name]

## 2024-01-24 NOTE — Transfer of Care (Signed)
 Immediate Anesthesia Transfer of Care Note  Patient: Carolyn [name]  Procedure(s) Performed: EGD (ESOPHAGOGASTRODUODENOSCOPY) DILATION, ESOPHAGUS  Patient Location: Endoscopy Unit  Anesthesia Type:General  Level of Consciousness: drowsy  Airway & Oxygen Therapy: Patient Spontanous Breathing  Post-op Assessment: Report given to RN and Post -op Vital signs reviewed and stable  Post vital signs: Reviewed and stable  Last Vitals:  Vitals Value Taken Time  BP 87/39 01/24/24 0911  Temp 37.2 C 01/24/24 0911  Pulse 71 01/24/24 0911  Resp 16 01/24/24 0911  SpO2 93 % 01/24/24 0911    Last Pain:  Vitals:   01/24/24 0911  TempSrc: Oral  PainSc: 0-No pain      Patients Stated Pain Goal: 5 (01/24/24 0804)  Complications: No notable events documented.

## 2024-01-24 NOTE — Anesthesia Preprocedure Evaluation (Signed)
 Anesthesia Evaluation  Patient identified by MRN, date of birth, ID band Patient awake    Reviewed: Allergy & Precautions, H&P , NPO status , Patient's Chart, lab work & pertinent test results, reviewed documented beta blocker date and time   Airway Mallampati: II  TM Distance: >3 FB Neck ROM: full    Dental no notable dental hx.    Pulmonary sleep apnea    Pulmonary exam normal breath sounds clear to auscultation       Cardiovascular Exercise Tolerance: Good hypertension,  Rhythm:regular Rate:Normal     Neuro/Psych  PSYCHIATRIC DISORDERS Anxiety Depression     Neuromuscular disease    GI/Hepatic Neg liver ROS,GERD  ,,  Endo/Other  diabetes    Renal/GU negative Renal ROS  negative genitourinary   Musculoskeletal   Abdominal   Peds  Hematology negative hematology ROS (+)   Anesthesia Other Findings   Reproductive/Obstetrics negative OB ROS                             Anesthesia Physical Anesthesia Plan  ASA: 3  Anesthesia Plan: General   Post-op Pain Management:    Induction:   PONV Risk Score and Plan: Propofol infusion  Airway Management Planned:   Additional Equipment:   Intra-op Plan:   Post-operative Plan:   Informed Consent: I have reviewed the patients History and Physical, chart, labs and discussed the procedure including the risks, benefits and alternatives for the proposed anesthesia with the patient or authorized representative who has indicated his/her understanding and acceptance.     Dental Advisory Given  Plan Discussed with: CRNA  Anesthesia Plan Comments:        Anesthesia Quick Evaluation

## 2024-01-25 ENCOUNTER — Encounter (HOSPITAL_COMMUNITY): Payer: Self-pay | Admitting: Gastroenterology

## 2024-01-25 ENCOUNTER — Other Ambulatory Visit: Payer: Self-pay | Admitting: Gastroenterology

## 2024-01-25 DIAGNOSIS — J029 Acute pharyngitis, unspecified: Secondary | ICD-10-CM

## 2024-01-25 MED ORDER — LIDOCAINE VISCOUS HCL 2 % MT SOLN: 10.0000 mL | Freq: Four times a day (QID) | OROMUCOSAL | 1 refills | Status: DC

## 2024-01-25 NOTE — Telephone Encounter (Signed)
 I sent Magic mouthwash preparation to her pharmacy, symptoms may last for up to a week but she she will slowly advance her diet as tolerated.  We had to dilate with the largest dilator so this is why she may have sore throat, but this should improve with time.

## 2024-01-25 NOTE — Telephone Encounter (Signed)
 Patient called back today 01/25/2024 states she tried the measures we recommended yesterday, but she feels like there is a lump in her throat on the right side and feels like it swollen in there. She says she tried taking her medications last night and a pill got hung in her throat and did not go down until this morning. She is wanting to know how long this is supposed to last and is scared to eat anything solid. Please advise, patient uses [facility] in [place], [place]. Thanks

## 2024-01-25 NOTE — Telephone Encounter (Signed)
 I spoke with the patient and made her aware per Dr. Levon Hedger.  I sent Magic mouthwash preparation to her pharmacy, symptoms may last for up to a week but she she will slowly advance her diet as tolerated.  We had to dilate with the largest dilator so this is why she may have sore throat, but this should improve with time.  Patient states understanding.

## 2024-01-27 LAB — MISC LABCORP TEST (SEND OUT): Labcorp test code: 165620

## 2024-01-27 NOTE — Anesthesia Postprocedure Evaluation (Signed)
 Anesthesia Post Note  Patient: Carolyn [name]  Procedure(s) Performed: EGD (ESOPHAGOGASTRODUODENOSCOPY) DILATION, ESOPHAGUS  Patient location during evaluation: Phase II Anesthesia Type: General Level of consciousness: awake Pain management: pain level controlled Vital Signs Assessment: post-procedure vital signs reviewed and stable Respiratory status: spontaneous breathing and respiratory function stable Cardiovascular status: blood pressure returned to baseline and stable Postop Assessment: no headache and no apparent nausea or vomiting Anesthetic complications: no Comments: Late entry   No notable events documented.   Last Vitals:  Vitals:   01/24/24 0911 01/24/24 0917  BP: (!) 87/39 111/70  Pulse: 71 75  Resp: 16 17  Temp: 37.2 C   SpO2: 93% 97%    Last Pain:  Vitals:   01/24/24 0911  TempSrc: Oral  PainSc: 0-No pain                 Windell Norfolk

## 2024-02-19 ENCOUNTER — Encounter: Payer: Self-pay | Admitting: Nutrition

## 2024-02-19 ENCOUNTER — Encounter: Attending: Internal Medicine | Admitting: Nutrition

## 2024-02-19 DIAGNOSIS — E782 Mixed hyperlipidemia: Secondary | ICD-10-CM | POA: Diagnosis present

## 2024-02-19 DIAGNOSIS — K760 Fatty (change of) liver, not elsewhere classified: Secondary | ICD-10-CM | POA: Diagnosis present

## 2024-02-19 DIAGNOSIS — E118 Type 2 diabetes mellitus with unspecified complications: Secondary | ICD-10-CM | POA: Insufficient documentation

## 2024-02-19 DIAGNOSIS — E66812 Obesity, class 2: Secondary | ICD-10-CM | POA: Insufficient documentation

## 2024-02-19 NOTE — Patient Instructions (Signed)
 Goals Keep up the good work.  Consider ordering food online and pick up vs going in grocery store or use [employer] for delivery Consider Healthy Choice meals Ask MD for referral for water therapy to help with you back and neck and shoulder issues since walking isnt an option for exercise Increase vegetables and fruits as able with high fiber foods. Consider referral for therapist to help with the depression and issues with  your husband.

## 2024-02-19 NOTE — Progress Notes (Signed)
 Medical Nutrition Therapy  Appointment Start time:  98 Appointment End time:  49  Primary concerns today:  DM Type 2, Obesity and Hyperlipidemia, Fatty LIver  Referral diagnosis: E11.8, E66.9, E78.0, K76 Preferred learning sty no preference Learning readiness: ready    NUTRITION ASSESSMENT  73 yr old wfemale referred for Type 2 DM referred from Dr. Lydia Sams. Has had shoulder and hip pain. Had PT and did helped her shoulder some but still having issues with neck and back pain. Started Mounjero  in Feb. Frustrated she hasn't lost more weight. Not hungry at times but doesn't know what she wants to eat. Stressful with husband health issues. He doesn't always want healthy food to eat. Pain limits her activity. Is taking Farxiga  10 mg a day. A1C 7.4% down from 7.6%. Trying to eat healthier.  Lab Results  Component Value Date   HGBA1C 7.4 (H) 12/14/2023   Anthropometrics  Wt Readings from Last 3 Encounters:  01/24/24 180 lb (81.6 kg)  01/03/24 185 lb 6.4 oz (84.1 kg)  01/02/24 185 lb 12.8 oz (84.3 kg)   Ht Readings from Last 3 Encounters:  01/24/24 5\' 2"  (1.575 m)  01/03/24 5\' 2"  (1.575 m)  01/02/24 5\' 2"  (1.575 m)   There is no height or weight on file to calculate BMI. @BMIFA @ Facility age limit for growth %iles is 20 years. Facility age limit for growth %iles is 20 years.    Clinical Medical Hx: . Past Medical History:  Diagnosis Date   Depression    Diabetes mellitus without complication (HCC)    Diastolic dysfunction    Hyperlipidemia    Myasthenia gravis (HCC)    Patient is Jehovah's Witness    Pulmonary embolism (HCC) 05/18/2021   Sleep apnea      Medications:  Current Outpatient Medications on File Prior to Visit  Medication Sig Dispense Refill   Blood Glucose Monitoring Suppl (BLOOD GLUCOSE SYSTEM PAK) KIT Use as directed to monitor FSBS 2x daily. Dx: E11.9 1 each 1   buPROPion  (WELLBUTRIN  SR) 200 MG 12 hr tablet Take 1 tablet (200 mg total) by mouth  daily. 30 tablet 5   Cholecalciferol (VITAMIN D3) 125 MCG (5000 UT) TABS Take 5,000 Units by mouth daily.     Coenzyme Q10 (CO Q 10 PO) Take 1 capsule by mouth daily.     FARXIGA  10 MG TABS tablet TAKE ONE TABLET BY MOUTH DAILY BEFORE BREAKFAST 90 tablet 0   glucose blood (ONETOUCH VERIO) test strip USE TO TEST BLOOD SUGAR TWICE DAILY 100 strip 3   Glycerin-Hypromellose-PEG 400 (DRY EYE RELIEF DROPS) 0.2-0.2-1 % SOLN Place 1 drop into both eyes daily as needed (Dry eye). homeopathic     hyoscyamine  (LEVSIN SL) 0.125 MG SL tablet Place 1 tablet (0.125 mg total) under the tongue every 6 (six) hours as needed (spasms). 60 tablet 1   losartan -hydrochlorothiazide (HYZAAR) 100-12.5 MG tablet Take 1 tablet by mouth daily. 90 tablet 1   magic mouthwash (lidocaine , diphenhydrAMINE, alum & mag hydroxide) suspension Swish and swallow 10 mLs 4 (four) times daily. 360 mL 1   MAGNESIUM PO Take 2 tablets by mouth daily at 2 am.     metoprolol  succinate (TOPROL -XL) 25 MG 24 hr tablet Take 1 tablet (25 mg total) by mouth daily. 90 tablet 1   Multiple Vitamins-Minerals (MULTI COMPLETE) CAPS Take 1 capsule by mouth daily.     naproxen  (NAPROSYN ) 500 MG tablet Take 1 tablet (500 mg total) by mouth 2 (two) times daily  with a meal. 60 tablet 2   pantoprazole  (PROTONIX ) 40 MG tablet Take 1 tablet (40 mg total) by mouth daily. 90 tablet 3   rosuvastatin  (CRESTOR ) 10 MG tablet Take 1 tablet (10 mg total) by mouth daily. 90 tablet 1   tirzepatide (MOUNJARO) 5 MG/0.5ML Pen Inject 5 mg into the skin once a week. 6 mL 1   VASCEPA  1 g capsule Take 2 capsules (2 g total) by mouth 2 (two) times daily. 360 capsule 3   Wheat Dextrin (BENEFIBER PO) Take by mouth. Takes one tablespoon tid     No current facility-administered medications on file prior to visit.    Labs:  Lab Results  Component Value Date   HGBA1C 7.4 (H) 12/14/2023      Latest Ref Rng & Units 01/19/2024    1:43 PM 12/14/2023   12:56 PM 08/16/2023    4:28 PM   CMP  Glucose 70 - 99 mg/dL 119  147  829   BUN 8 - 23 mg/dL 19  16  13    Creatinine 0.44 - 1.00 mg/dL 5.62  1.30  8.65   Sodium 135 - 145 mmol/L 138  140  142   Potassium 3.5 - 5.1 mmol/L 3.7  3.8  3.9   Chloride 98 - 111 mmol/L 103  102  99   CO2 22 - 32 mmol/L 25  25  23    Calcium  8.9 - 10.3 mg/dL 9.5  9.8  78.4   Total Protein 6.5 - 8.1 g/dL  7.9  7.3   Total Bilirubin 0.0 - 1.2 mg/dL  1.1  1.1   Alkaline Phos 38 - 126 U/L  77  89   AST 15 - 41 U/L  26  21   ALT 0 - 44 U/L  43  20     Notable Signs/Symptoms:   Lifestyle & Dietary Hx Lives with her husband,  Estimated daily fluid intake: 30 oz Supplements:  Sleep: varies Stress / self-care:  Current average weekly physical activity: ADL  24-Hr Dietary Recall Eats 2 -3 meals per day   Estimated Energy Needs Calories: 1200 Carbohydrate: 135g Protein: 90g Fat: 33g   NUTRITION DIAGNOSIS  NB-1.1 Food and nutrition-related knowledge deficit As related to DIabetes and Obesity.  As evidenced by A1C 7.1% and BMI > 30.   NUTRITION INTERVENTION  Nutrition education (E-1) on the following topics:  Nutrition and Diabetes education provided on My Plate, CHO counting, meal planning, portion sizes, timing of meals, avoiding snacks between meals unless having a low blood sugar, target ranges for A1C and blood sugars, signs/symptoms and treatment of hyper/hypoglycemia, monitoring blood sugars, taking medications as prescribed, benefits of exercising 30 minutes per day and prevention of complications of DM.  Lifestyle Medicine  - Whole Food, Plant Predominant Nutrition is highly recommended: Eat Plenty of vegetables, Mushrooms, fruits, Legumes, Whole Grains, Nuts, seeds in lieu of processed meats, processed snacks/pastries red meat, poultry, eggs.    -It is better to avoid simple carbohydrates including: Cakes, Sweet Desserts, Ice Cream, Soda (diet and regular), Sweet Tea, Candies, Chips, Cookies, Store Bought Juices, Alcohol in  Excess of  1-2 drinks a day, Lemonade,  Artificial Sweeteners, Doughnuts, Coffee Creamers, "Sugar-free" Products, etc, etc.  This is not a complete list.....  Exercise: If you are able: 30 -60 minutes a day ,4 days a week, or 150 minutes a week.  The longer the better.  Combine stretch, strength, and aerobic activities.  If you were told in the past  that you have high risk for cardiovascular diseases, you may seek evaluation by your heart doctor prior to initiating moderate to intense exercise programs.   Handouts Provided Include  Know Your Numbers Lifestyle Medicine  Learning Style & Readiness for Change Teaching method utilized: Visual & Auditory  Demonstrated degree of understanding via: Teach Back  Barriers to learning/adherence to lifestyle change: None  Goals Established by Pt Goals  Goals Keep up the good work.  Consider ordering food online and pick up vs going in grocery store or use [employer] [facility] for delivery Consider Healthy Choice meals Ask MD for referral for water therapy to help with you back and neck and shoulder issues since walking isnt an option for exercise Increase vegetables and fruits as able with high fiber foods. Consider referral for therapist to help with the depression and issues with  your husband. MONITORING & EVALUATION Dietary intake, weekly physical activity, and BS and weight in 1 month.  Next Steps  Patient is to work on meal planning and eating more foods from a garden.Aaron Aas

## 2024-03-15 ENCOUNTER — Telehealth: Payer: Self-pay | Admitting: Internal Medicine

## 2024-03-15 ENCOUNTER — Ambulatory Visit

## 2024-03-15 VITALS — BP 96/64 | HR 80 | Ht 62.0 in | Wt 183.0 lb

## 2024-03-15 DIAGNOSIS — Z0000 Encounter for general adult medical examination without abnormal findings: Secondary | ICD-10-CM | POA: Diagnosis not present

## 2024-03-15 NOTE — Progress Notes (Signed)
 Subjective:   Carolyn [name] is a 73 y.o. who presents for a Medicare Wellness preventive visit.  As a reminder, Annual Wellness Visits don't include a physical exam, and some assessments may be limited, especially if this visit is performed virtually. We may recommend an in-person follow-up visit with your provider if needed.  Visit Complete: Virtual I connected with  Carolyn [name] on 03/15/24 by a audio enabled telemedicine application and verified that I am speaking with the correct person using two identifiers.  Patient Location: Home  Provider Location: Home Office  I discussed the limitations of evaluation and management by telemedicine. The patient expressed understanding and agreed to proceed.  Vital Signs: Because this visit was a virtual/telehealth visit, some criteria may be missing or patient reported. Any vitals not documented were not able to be obtained and vitals that have been documented are patient reported.  VideoDeclined- This patient declined Librarian, academic. Therefore the visit was completed with audio only.  Persons Participating in Visit: Patient.  AWV Questionnaire: Yes: Patient Medicare AWV questionnaire was completed by the patient on 03/15/2024; I have confirmed that all information answered by patient is correct and no changes since this date.  Cardiac Risk Factors include: advanced age (>55men, >63 women);diabetes mellitus;hypertension;dyslipidemia;obesity (BMI >30kg/m2)     Objective:     Today's Vitals   03/14/24 1217 03/15/24 1216  BP: 112/71 96/64  Pulse: 77 80  Weight:  183 lb (83 kg)  Height:  5\' 2"  (1.575 m)   Body mass index is 33.47 kg/m.     03/15/2024   12:28 PM 01/24/2024    7:51 AM 03/20/2023   12:51 PM 03/06/2023    8:06 AM 05/29/2022    6:41 AM 02/14/2022    2:13 PM 05/18/2021   10:42 AM  Advanced Directives  Does Patient Have a Medical Advance Directive? Yes Yes No Yes No Yes No  Type of Sports coach of [place];Living will Healthcare Power of [employer] Power of Attorney   Does patient want to make changes to medical advance directive? No - Patient declined     No - Patient declined   Copy of Healthcare Power of Attorney in Chart? Yes - validated most recent copy scanned in chart (See row information)     Yes - validated most recent copy scanned in chart (See row information)   Would patient like information on creating a medical advance directive?     No - Patient declined  No - Patient declined    Current Medications (verified) Outpatient Encounter Medications as of 03/15/2024  Medication Sig   Blood Glucose Monitoring Suppl (BLOOD GLUCOSE SYSTEM PAK) KIT Use as directed to monitor FSBS 2x daily. Dx: E11.9   buPROPion  (WELLBUTRIN  SR) 200 MG 12 hr tablet Take 1 tablet (200 mg total) by mouth daily.   Cholecalciferol (VITAMIN D3) 125 MCG (5000 UT) TABS Take 5,000 Units by mouth daily.   Coenzyme Q10 (CO Q 10 PO) Take 1 capsule by mouth daily.   FARXIGA  10 MG TABS tablet TAKE ONE TABLET BY MOUTH DAILY BEFORE BREAKFAST   glucose blood (ONETOUCH VERIO) test strip USE TO TEST BLOOD SUGAR TWICE DAILY   Glycerin-Hypromellose-PEG 400 (DRY EYE RELIEF DROPS) 0.2-0.2-1 % SOLN Place 1 drop into both eyes daily as needed (Dry eye). homeopathic   hyoscyamine  (LEVSIN SL) 0.125 MG SL tablet Place 1 tablet (0.125 mg total) under the tongue every 6 (six) hours as  needed (spasms).   losartan -hydrochlorothiazide (HYZAAR) 100-12.5 MG tablet Take 1 tablet by mouth daily.   MAGNESIUM PO Take 2 tablets by mouth daily at 2 am.   metoprolol  succinate (TOPROL -XL) 25 MG 24 hr tablet Take 1 tablet (25 mg total) by mouth daily.   Multiple Vitamins-Minerals (MULTI COMPLETE) CAPS Take 1 capsule by mouth daily.   naproxen  (NAPROSYN ) 500 MG tablet Take 500 mg by mouth 2 (two) times daily as needed (pain).   pantoprazole  (PROTONIX ) 40 MG tablet Take 1 tablet (40 mg total) by mouth  daily.   rosuvastatin  (CRESTOR ) 10 MG tablet Take 1 tablet (10 mg total) by mouth daily.   tirzepatide (MOUNJARO) 5 MG/0.5ML Pen Inject 5 mg into the skin once a week.   VASCEPA  1 g capsule Take 2 capsules (2 g total) by mouth 2 (two) times daily.   Wheat Dextrin (BENEFIBER PO) Take by mouth. Takes one tablespoon tid   magic mouthwash (lidocaine , diphenhydrAMINE, alum & mag hydroxide) suspension Swish and swallow 10 mLs 4 (four) times daily.   naproxen  (NAPROSYN ) 500 MG tablet Take 1 tablet (500 mg total) by mouth 2 (two) times daily with a meal. (Patient not taking: Reported on 03/15/2024)   No facility-administered encounter medications on file as of 03/15/2024.    Allergies (verified) Prednisolone acetate-nepafenac, Prozac [fluoxetine hcl], Codeine, Erythromycin, Fluoxetine, Other, and Sulfa antibiotics   History: Past Medical History:  Diagnosis Date   Allergy    Anxiety    Arthritis    Cataract    Surgical removal 03/2021   Depression    Diabetes mellitus without complication (HCC)    Diastolic dysfunction    GERD (gastroesophageal reflux disease)    Hyperlipidemia    Hypertension    Myasthenia gravis (HCC)    Patient is Jehovah's Witness    Pulmonary embolism (HCC) 05/18/2021   Sleep apnea    Past Surgical History:  Procedure Laterality Date   APPENDECTOMY     BIOPSY  11/11/2020   Procedure: BIOPSY;  Surgeon: Urban Garden, MD;  [facility];  Service: Gastroenterology;;  duodenum gastric   BUNIONECTOMY     COLONOSCOPY WITH PROPOFOL  N/A 11/11/2020   Procedure: COLONOSCOPY WITH PROPOFOL ;  Surgeon: Urban Garden, MD;  Location: [facility];  Service: Gastroenterology;  Laterality: N/A;  11:15   ESOPHAGEAL DILATION  03/23/2023   Procedure: ESOPHAGEAL DILATION;  Surgeon: Urban Garden, MD;  [facility]: [facility];  Service: Gastroenterology;;   ESOPHAGEAL DILATION N/A 01/24/2024   Procedure: DILATION, ESOPHAGUS;  Surgeon:  Urban Garden, MD;  [facility]: [facility];  Service: Gastroenterology;  Laterality: N/A;  9:00AM;ASA 2   ESOPHAGOGASTRODUODENOSCOPY N/A 01/24/2024   Procedure: EGD (ESOPHAGOGASTRODUODENOSCOPY);  Surgeon: Umberto Ganong, Bearl Limes, MD;  Location: [facility];  Service: Gastroenterology;  Laterality: N/A;  9:00AM;ASA 2   ESOPHAGOGASTRODUODENOSCOPY (EGD) WITH PROPOFOL  N/A 11/11/2020   Procedure: ESOPHAGOGASTRODUODENOSCOPY (EGD) WITH PROPOFOL ;  Surgeon: Urban Garden, MD;  [facility]: [facility];  Service: Gastroenterology;  Laterality: N/A;  to arrive at 0845   ESOPHAGOGASTRODUODENOSCOPY (EGD) WITH PROPOFOL  N/A 03/23/2023   Procedure: ESOPHAGOGASTRODUODENOSCOPY (EGD) WITH PROPOFOL ;  Surgeon: Urban Garden, MD;  [facility];  Service: Gastroenterology;  Laterality: N/A;  7:30am;asa 2   EYE SURGERY  03/2021   Contaracts removed   MANDIBLE SURGERY     TONSILECTOMY, ADENOIDECTOMY, BILATERAL MYRINGOTOMY AND TUBES     TUBAL LIGATION  1990   Family History  Problem Relation Age of Onset  Arthritis Mother    Depression Mother    Diabetes Mother    Hypertension Mother    Anxiety disorder Mother    Cancer Mother    Heart disease Mother    Kidney disease Mother    Obesity Mother    Alcohol abuse Father    Hypertension Father    Heart disease Father 17       Massive MI   Diabetes Brother    Diabetes Brother    Heart disease Brother    Diabetes Brother        Type I    Cancer Brother    Social History   Socioeconomic History   Marital status: Married    Spouse name: Bambi Lever   Number of children: 4   Years of education: some college   Highest education level: Some college, no degree  Occupational History   Occupation: Retired  Tobacco Use   Smoking status: Never    Passive exposure: Never   Smokeless tobacco: Never  Vaping Use   Vaping status: Never Used  Substance and Sexual Activity   Alcohol use: Not Currently    Comment:  socially on rare occasions   Drug use: No   Sexual activity: Yes    Birth control/protection: Other-see comments    Comment: postmenopausal  Other Topics Concern   Not on file  Social History Narrative   Not on file   Social Drivers of Health   Financial Resource Strain: Low Risk  (03/15/2024)   Overall Financial Resource Strain (CARDIA)    Difficulty of Paying Living Expenses: Not hard at all  Food Insecurity: No Food Insecurity (03/15/2024)   Hunger Vital Sign    Worried About Running Out of Food in the Last Year: Never true    Ran Out of Food in the Last Year: Never true  Transportation Needs: No Transportation Needs (03/15/2024)   PRAPARE - Administrator, Civil Service (Medical): No    Lack of Transportation (Non-Medical): No  Physical Activity: Insufficiently Active (03/15/2024)   Exercise Vital Sign    Days of Exercise per Week: 2 days    Minutes of Exercise per Session: 20 min  Stress: No Stress Concern Present (03/15/2024)   [employer] of Occupational Health - Occupational Stress Questionnaire    Feeling of Stress : Only a little  Social Connections: Moderately Integrated (03/15/2024)   Social Connection and Isolation Panel [NHANES]    Frequency of Communication with Friends and Family: More than three times a week    Frequency of Social Gatherings with Friends and Family: More than three times a week    Attends Religious Services: More than 4 times per year    Active Member of [employer] or Organizations: No    Attends Engineer, structural: Never    Marital Status: Married    Tobacco Counseling Counseling given: Yes    Clinical Intake:  Pre-visit preparation completed: Yes  Pain : No/denies pain     BMI - recorded: 33.47 Nutritional Risks: None Diabetes: Yes CBG done?: No (telehealth visit.) Did pt. bring in CBG monitor from home?: No  Lab Results  Component Value Date   HGBA1C 7.4 (H) 12/14/2023   HGBA1C 7.6 (H) 08/16/2023    HGBA1C 7.4 (H) 03/20/2023     How often do you need to have someone help you when you read instructions, pamphlets, or other written materials from your doctor or pharmacy?: 1 - Never  Interpreter Needed?: No  Information  entered by :: Sally Crazier CMA   Activities of Daily Living     03/15/2024    7:51 AM 03/20/2023   12:53 PM  In your present state of health, do you have any difficulty performing the following activities:  Hearing? 0   Vision? 0   Difficulty concentrating or making decisions? 0   Walking or climbing stairs? 0   Dressing or bathing? 0   Doing errands, shopping? 0 0  Preparing Food and eating ? N   Using the Toilet? N   In the past six months, have you accidently leaked urine? Y   Do you have problems with loss of bowel control? N   Managing your Medications? N   Managing your Finances? N   Housekeeping or managing your Housekeeping? Y     Patient Care Team: Meldon Sport, MD as PCP - General (Internal Medicine) Flavia Hughs, MD (Inactive) as PCP - Cardiology (Cardiology) Myrle Aspen, [facility] (Inactive) as Pharmacist (Pharmacist)  Indicate any recent Medical Services you may have received from other than [facility] providers in the past year (date may be approximate).     Assessment:    This is a routine wellness examination for [place].  Hearing/Vision screen Hearing Screening - Comments:: Patient denies any hearing difficulties.   Vision Screening - Comments:: Wears rx glasses - up to date with routine eye exams  Patient prefers to see an ophthalmologist. Will send note to eye care provider.    Goals Addressed             This Visit's Progress    Exercise 3x per week (30 min per time)   On track    Patient Stated   On track    Patient states that her goal is to continue taking care of herself.       Depression Screen     03/15/2024   11:45 AM 01/03/2024    3:02 PM 12/18/2023   11:16 AM 08/15/2023    1:01 PM 04/12/2023    1:19 PM  03/09/2023    9:21 AM 03/06/2023    8:08 AM  PHQ 2/9 Scores  PHQ - 2 Score 1 0 0 1 1 0 0  PHQ- 9 Score 8 0 0 7       Fall Risk     03/15/2024    7:51 AM 01/03/2024    3:02 PM 12/18/2023   11:16 AM 10/18/2023    9:40 AM 08/15/2023    1:01 PM  Fall Risk   Falls in the past year? 0 0 0 0 0  Number falls in past yr: 0 0 0  0  Injury with Fall? 0 0 0 0 0  Risk for fall due to : No Fall Risks No Fall Risks No Fall Risks Other (Comment) No Fall Risks  Risk for fall due to: Comment    age   Follow up Falls prevention discussed;Falls evaluation completed Falls evaluation completed Falls evaluation completed Falls evaluation completed Falls evaluation completed    MEDICARE RISK AT HOME:  Medicare Risk at Home Any stairs in or around the home?: (Patient-Rptd) No Home free of loose throw rugs in walkways, pet beds, electrical cords, etc?: (Patient-Rptd) No Adequate lighting in your home to reduce risk of falls?: (Patient-Rptd) Yes Life alert?: (Patient-Rptd) No Use of a cane, walker or w/c?: (Patient-Rptd) No Grab bars in the bathroom?: (Patient-Rptd) Yes Shower chair or bench in shower?: (Patient-Rptd) Yes Elevated toilet seat or a handicapped  toilet?: (Patient-Rptd) No  TIMED UP AND GO:  Was the test performed?  No  Cognitive Function: 6CIT completed        03/15/2024   12:28 PM 03/06/2023    8:08 AM 02/14/2022    2:20 PM 02/10/2021    1:26 PM  6CIT Screen  What Year? 0 points 0 points 0 points 0 points  What month? 0 points 0 points 0 points 0 points  What time? 0 points 0 points 0 points 0 points  Count back from 20 0 points 0 points 0 points 0 points  Months in reverse 0 points 0 points 0 points 0 points  Repeat phrase 0 points 0 points 0 points 2 points  Total Score 0 points 0 points 0 points 2 points    Immunizations Immunization History  Administered Date(s) Administered   Fluad Quad(high Dose 65+) 09/15/2021, 08/02/2022   Fluad Trivalent(High Dose 65+) 08/15/2023    Moderna SARS-COV2 Booster Vaccination 11/14/2020   Moderna Sars-Covid-2 Vaccination 04/14/2020, 05/12/2020   PNEUMOCOCCAL CONJUGATE-20 09/27/2021   Pfizer(Comirnaty)Fall Seasonal Vaccine 12 years and older 09/23/2023   Tdap 08/30/2023   Zoster Recombinant(Shingrix) 07/01/2022, 09/14/2022    Screening Tests Health Maintenance  Topic Date Due   Diabetic kidney evaluation - Urine ACR  04/11/2024   COVID-19 Vaccine (7 - 2024-25 season) 03/22/2024   FOOT EXAM  04/11/2024   INFLUENZA VACCINE  05/31/2024   HEMOGLOBIN A1C  06/12/2024   MAMMOGRAM  06/21/2024   OPHTHALMOLOGY EXAM  01/04/2025   Diabetic kidney evaluation - eGFR measurement  01/18/2025   Medicare Annual Wellness (AWV)  03/15/2025   DEXA SCAN  04/30/2025   Colonoscopy  11/11/2030   DTaP/Tdap/Td (2 - Td or Tdap) 08/29/2033   Pneumonia Vaccine 30+ Years old  Completed   Hepatitis C Screening  Completed   Zoster Vaccines- Shingrix  Completed   HPV VACCINES  Aged Out   Meningococcal B Vaccine  Aged Out    Health Maintenance  Health Maintenance Due  Topic Date Due   Diabetic kidney evaluation - Urine ACR  04/11/2024   Health Maintenance Items Addressed: Discussed patients self reported fatigue. Offered sooner appointment than April 16, 2024. Patient declined. Encouraged patient to contact office should she change her mind. Per patients primary care provider, Diabetic kidney evaluation-Urine ACR was not ordered. He will order it the day of her next visit.   Additional Screening:  Vision Screening: Recommended annual ophthalmology exams for early detection of glaucoma and other disorders of the eye. Patient is up to date on exams, but last time saw a PA, but prefers to see an MD due to having DM. Will send a message letting them know.  Dental Screening: Recommended annual dental exams for proper oral hygiene  Community Resource Referral / Chronic Care Management: CRR required this visit?  No   CCM required this visit?   No   Plan:    I have personally reviewed and noted the following in the patient's chart:   Medical and social history Use of alcohol, tobacco or illicit drugs  Current medications and supplements including opioid prescriptions. Patient is not currently taking opioid prescriptions. Functional ability and status Nutritional status Physical activity Advanced directives List of other physicians Hospitalizations, surgeries, and ER visits in previous 12 months Vitals Screenings to include cognitive, depression, and falls Referrals and appointments  In addition, I have reviewed and discussed with patient certain preventive protocols, quality metrics, and best practice recommendations. A written personalized care plan for preventive  services as well as general preventive health recommendations were provided to patient.   Darren Nodal, CMA   03/15/2024   After Visit Summary: (MyChart) Due to this being a telephonic visit, the after visit summary with patients personalized plan was offered to patient via MyChart   Notes: Nothing significant to report at this time.

## 2024-03-15 NOTE — Telephone Encounter (Signed)
 Copied from CRM [id number]. Topic: Appointments - Scheduling Inquiry for Clinic >> Mar 15, 2024  9:04 AM Crispin Dolphin wrote: Reason for CRM: Patient Called states she had a phone call schedule for 8am she was available and waiting for call and did not receive a call. She wanted to make sure it was documented. Thank You

## 2024-03-15 NOTE — Patient Instructions (Addendum)
 Ms. Birkle , Thank you for taking time out of your busy schedule to complete your Annual Wellness Visit with me. I enjoyed our conversation and look forward to speaking with you again next year. I, as well as your care team,  appreciate your ongoing commitment to your health goals. Please review the following plan we discussed and let me know if I can assist you in the future.  Your Game plan/ To Do List     Referrals: If you haven't heard from the office you've been referred to, please reach out to them at the phone number provided.  I will send Dr. Marvin Slot office a note letting them know that you prefer to see the ophthalmologist and not the PA  Dr. Lydia Sams got back with me and he will check your A1C in the office during your next appointment on April 16, 2024  Follow up Visits:  Next Medicare AWV with our clinical staff: May 19. 2026 at 10:40 am video visit    Have you seen your provider in the last 6 months (3 months if uncontrolled diabetes)? yes  Next Office Visit with your provider: April 16, 2025 with Dr. Lydia Sams  Clinician Recommendations:    Aim for 30 minutes of exercise or brisk walking, 6-8 glasses of water, and 5 servings of fruits and vegetables each day.   I enjoyed our conversation today and look forward to talking with you again next year!! Have a wonderful and safe year. All the best, Yetunde Leis      This is a list of the screening recommended for you and due dates:  Health Maintenance  Topic Date Due   Medicare Annual Wellness Visit  03/05/2024   Yearly kidney health urinalysis for diabetes  04/11/2024   COVID-19 Vaccine (5 - 2024-25 season) 03/22/2024   Complete foot exam   04/11/2024   Flu Shot  05/31/2024   Hemoglobin A1C  06/12/2024   Mammogram  06/21/2024   Eye exam for diabetics  01/04/2025   Yearly kidney function blood test for diabetes  01/18/2025   DEXA scan (bone density measurement)  04/30/2025   Colon Cancer Screening  11/11/2030   DTaP/Tdap/Td vaccine (2  - Td or Tdap) 08/29/2033   Pneumonia Vaccine  Completed   Hepatitis C Screening  Completed   Zoster (Shingles) Vaccine  Completed   HPV Vaccine  Aged Out   Meningitis B Vaccine  Aged Out    Advanced directives: (In Chart) A copy of your advanced directives are scanned into your chart should your provider ever need it. Advance Care Planning is important because it:  [x]  Makes sure you receive the medical care that is consistent with your values, goals, and preferences  [x]  It provides guidance to your family and loved ones and reduces their decisional burden about whether or not they are making the right decisions based on your wishes.  Follow the link provided in your after visit summary or read over the paperwork we have mailed to you to help you started getting your Advance Directives in place. If you need assistance in completing these, please reach out to us  so that we can help you!  See attachments for Preventive Care and Fall Prevention Tips.   Understanding Your Risk for Falls  Millions of people have serious injuries from falls each year. It is important to understand your risk of falling. Talk with your health care provider about your risk and what you can do to lower it. If you do have  a serious fall, make sure to tell your provider. Falling once raises your risk of falling again. How can falls affect me? Serious injuries from falls are common. These include: Broken bones, such as hip fractures. Head injuries, such as traumatic brain injuries (TBI) or concussions. A fear of falling can cause you to avoid activities and stay at home. This can make your muscles weaker and raise your risk for a fall. What can increase my risk? There are a number of risk factors that increase your risk for falling. The more risk factors you have, the higher your risk of falling. Serious injuries from a fall happen most often to people who are older than 73 years old. Teenagers and young adults ages  74-29 are also at higher risk. Common risk factors include: Weakness in the lower body. Being generally weak or confused due to long-term (chronic) illness. Dizziness or balance problems. Poor vision. Medicines that cause dizziness or drowsiness. These may include: Medicines for your blood pressure, heart, anxiety, insomnia, or swelling (edema). Pain medicines. Muscle relaxants. Other risk factors include: Drinking alcohol. Having had a fall in the past. Having foot pain or wearing improper footwear. Working at a dangerous job. Having any of the following in your home: Tripping hazards, such as floor clutter or loose rugs. Poor lighting. Pets. Having dementia or memory loss. What actions can I take to lower my risk of falling?     Physical activity Stay physically fit. Do strength and balance exercises. Consider taking a regular class to build strength and balance. Yoga and tai chi are good options. Vision Have your eyes checked every year and your prescription for glasses or contacts updated as needed. Shoes and walking aids Wear non-skid shoes. Wear shoes that have rubber soles and low heels. Do not wear high heels. Do not walk around the house in socks or slippers. Use a cane or walker as told by your provider. Home safety Attach secure railings on both sides of your stairs. Install grab bars for your bathtub, shower, and toilet. Use a non-skid mat in your bathtub or shower. Attach bath mats securely with double-sided, non-slip rug tape. Use good lighting in all rooms. Keep a flashlight near your bed. Make sure there is a clear path from your bed to the bathroom. Use night-lights. Do not use throw rugs. Make sure all carpeting is taped or tacked down securely. Remove all clutter from walkways and stairways, including extension cords. Repair uneven or broken steps and floors. Avoid walking on icy or slippery surfaces. Walk on the grass instead of on icy or slick sidewalks.  Use ice melter to get rid of ice on walkways in the winter. Use a cordless phone. Questions to ask your health care provider Can you help me check my risk for a fall? Do any of my medicines make me more likely to fall? Should I take a vitamin D  supplement? What exercises can I do to improve my strength and balance? Should I make an appointment to have my vision checked? Do I need a bone density test to check for weak bones (osteoporosis)? Would it help to use a cane or a walker? Where to find more information Centers for Disease Control and Prevention, STEAD[employer]: TonerPromos.no Community-Based Fall Prevention Programs: TonerPromos.no [employer] on Aging: BaseRingTones.pl Contact a health care provider if: You fall at home. You are afraid of falling at home. You feel weak, drowsy, or dizzy. This information is not intended to replace advice given to you  by your health care provider. Make sure you discuss any questions you have with your health care provider. Document Revised: 06/20/2022 Document Reviewed: 06/20/2022 Elsevier Patient Education  2024 [employer]. Understanding Your Risk for Falls Millions of people have serious injuries from falls each year. It is important to understand your risk of falling. Talk with your health care provider about your risk and what you can do to lower it. If you do have a serious fall, make sure to tell your provider. Falling once raises your risk of falling again. How can falls affect me? Serious injuries from falls are common. These include: Broken bones, such as hip fractures. Head injuries, such as traumatic brain injuries (TBI) or concussions. A fear of falling can cause you to avoid activities and stay at home. This can make your muscles weaker and raise your risk for a fall. What can increase my risk? There are a number of risk factors that increase your risk for falling. The more risk factors you have, the higher your risk of falling. Serious injuries from  a fall happen most often to people who are older than 73 years old. Teenagers and young adults ages 25-29 are also at higher risk. Common risk factors include: Weakness in the lower body. Being generally weak or confused due to long-term (chronic) illness. Dizziness or balance problems. Poor vision. Medicines that cause dizziness or drowsiness. These may include: Medicines for your blood pressure, heart, anxiety, insomnia, or swelling (edema). Pain medicines. Muscle relaxants. Other risk factors include: Drinking alcohol. Having had a fall in the past. Having foot pain or wearing improper footwear. Working at a dangerous job. Having any of the following in your home: Tripping hazards, such as floor clutter or loose rugs. Poor lighting. Pets. Having dementia or memory loss. What actions can I take to lower my risk of falling?     Physical activity Stay physically fit. Do strength and balance exercises. Consider taking a regular class to build strength and balance. Yoga and tai chi are good options. Vision Have your eyes checked every year and your prescription for glasses or contacts updated as needed. Shoes and walking aids Wear non-skid shoes. Wear shoes that have rubber soles and low heels. Do not wear high heels. Do not walk around the house in socks or slippers. Use a cane or walker as told by your provider. Home safety Attach secure railings on both sides of your stairs. Install grab bars for your bathtub, shower, and toilet. Use a non-skid mat in your bathtub or shower. Attach bath mats securely with double-sided, non-slip rug tape. Use good lighting in all rooms. Keep a flashlight near your bed. Make sure there is a clear path from your bed to the bathroom. Use night-lights. Do not use throw rugs. Make sure all carpeting is taped or tacked down securely. Remove all clutter from walkways and stairways, including extension cords. Repair uneven or broken steps and  floors. Avoid walking on icy or slippery surfaces. Walk on the grass instead of on icy or slick sidewalks. Use ice melter to get rid of ice on walkways in the winter. Use a cordless phone. Questions to ask your health care provider Can you help me check my risk for a fall? Do any of my medicines make me more likely to fall? Should I take a vitamin D  supplement? What exercises can I do to improve my strength and balance? Should I make an appointment to have my vision checked? Do I need a  bone density test to check for weak bones (osteoporosis)? Would it help to use a cane or a walker? Where to find more information Centers for Disease Control and Prevention, STEAD[employer]: TonerPromos.no Community-Based Fall Prevention Programs: TonerPromos.no [employer] on Aging: BaseRingTones.pl Contact a health care provider if: You fall at home. You are afraid of falling at home. You feel weak, drowsy, or dizzy. This information is not intended to replace advice given to you by your health care provider. Make sure you discuss any questions you have with your health care provider. Document Revised: 06/20/2022 Document Reviewed: 06/20/2022 Elsevier Patient Education  2024 [employer].

## 2024-03-18 ENCOUNTER — Other Ambulatory Visit: Payer: Self-pay | Admitting: Internal Medicine

## 2024-03-18 DIAGNOSIS — E1169 Type 2 diabetes mellitus with other specified complication: Secondary | ICD-10-CM

## 2024-03-18 NOTE — Telephone Encounter (Signed)
 AWV was completed with patient.

## 2024-04-16 ENCOUNTER — Encounter: Payer: Self-pay | Admitting: Internal Medicine

## 2024-04-16 ENCOUNTER — Ambulatory Visit: Payer: Medicare Other | Admitting: Internal Medicine

## 2024-04-16 VITALS — BP 115/74 | HR 75 | Ht 62.0 in | Wt 185.6 lb

## 2024-04-16 DIAGNOSIS — I10 Essential (primary) hypertension: Secondary | ICD-10-CM

## 2024-04-16 DIAGNOSIS — F339 Major depressive disorder, recurrent, unspecified: Secondary | ICD-10-CM | POA: Diagnosis not present

## 2024-04-16 DIAGNOSIS — M25511 Pain in right shoulder: Secondary | ICD-10-CM

## 2024-04-16 DIAGNOSIS — E782 Mixed hyperlipidemia: Secondary | ICD-10-CM | POA: Diagnosis not present

## 2024-04-16 DIAGNOSIS — E1169 Type 2 diabetes mellitus with other specified complication: Secondary | ICD-10-CM | POA: Diagnosis not present

## 2024-04-16 DIAGNOSIS — Z7984 Long term (current) use of oral hypoglycemic drugs: Secondary | ICD-10-CM

## 2024-04-16 DIAGNOSIS — G8929 Other chronic pain: Secondary | ICD-10-CM

## 2024-04-16 MED ORDER — DAPAGLIFLOZIN PROPANEDIOL 10 MG PO TABS: 10.0000 mg | ORAL_TABLET | Freq: Every day | ORAL | 3 refills | Status: AC

## 2024-04-16 MED ORDER — LOSARTAN POTASSIUM 100 MG PO TABS
100.0000 mg | ORAL_TABLET | Freq: Every day | ORAL | 2 refills | Status: AC
Start: 2024-04-16 — End: ?

## 2024-04-16 MED ORDER — ROSUVASTATIN CALCIUM 10 MG PO TABS: 10.0000 mg | ORAL_TABLET | Freq: Every day | ORAL | 1 refills | Status: AC

## 2024-04-16 NOTE — Assessment & Plan Note (Signed)
 Checked lipid profile - has h/o DM and hypertriglyceridemia On Crestor and Vascepa

## 2024-04-16 NOTE — Assessment & Plan Note (Signed)
 Flowsheet Row Office Visit from 04/16/2024 in [facility]  PHQ-9 Total Score 9   Well-controlled, on Wellbutrin  200 mg SR QD Had severe anxiety and insomnia with Zoloft  50 mg QD, discontinued Did not have benefit with Lexapro  in the past Used to be followed by psychiatry in [place]

## 2024-04-16 NOTE — Assessment & Plan Note (Signed)
 BP Readings from Last 1 Encounters:  04/16/24 115/74   Usually well-controlled with  Losartan -HCTZ 100-12.5 mg QD and Metoprolol  25 mg QD Counseled for compliance with the medications Advised DASH diet and moderate exercise/walking, at least 150 mins/week

## 2024-04-16 NOTE — Patient Instructions (Signed)
 Please take Tylenol  arthritis as needed for shoulder pain. Please apply warm compresses over neck/shoulder area.  Please start taking Losartan  100 mg once daily instead of Losartan -hydrochlorothiazide.  Please continue to take other medications as prescribed.  Please continue to follow low carb diet and perform moderate exercise/walking at least 150 mins/week.

## 2024-04-16 NOTE — Assessment & Plan Note (Addendum)
 Lab Results  Component Value Date   HGBA1C 7.4 (H) 12/14/2023   Associated with HLD and HTN Well-controlled now, and she is trying to improve her diet -followed by nutritionist On Farxiga , did not tolerate Metformin  On Mounjaro  5 mg qw now Check CMP and HbA1c Advised to follow diabetic diet On ARB and statin

## 2024-04-16 NOTE — Assessment & Plan Note (Addendum)
 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

## 2024-04-16 NOTE — Progress Notes (Signed)
 Established Patient Office Visit  Subjective:  Patient ID: Carolyn [name], female    DOB: 22-Apr-1951  Age: 73 y.o. MRN: [id number]  CC:  Chief Complaint  Patient presents with   Medical Management of Chronic Issues    4 month f/u   Shoulder Pain    Pt reports right shoulder pain    HPI Carolyn [name] is a 73 y.o. female with past medical history of HTN, PE, OSA, IBS, DM, HLD, depression and obesity who presents for f/u of her chronic medical conditions.  HTN: BP is wnl. She takes Losartan -hydrochlorothiazide 100-12.5 mg once daily and Metoprolol  25 mg once daily regularly. Patient denies headache, dizziness, chest pain, dyspnea or palpitations.  Type II DM: She takes Mounjaro  5 mg QW and Farxiga  10 mg QD for it. Her last HbA1c was 7.4 in 02/25, after which Mounjaro  was started.  She reports improvement in glycemic profile overall, blood glucose is ranging around 96-145 mostly.  Denies any polyuria or polyphagia currently.  She has had nutritionist counseling and has been trying to improve her diet.  She has been taking statin and Vascepa  for HLD.  Depression: She is doing well overall with Wellbutrin  SR 200 mg QD.  She has been recently stressed due to her husband's stroke.  She used to see psychiatry, but has been discharged now.  She denies any SI or HI currently. She has been on Zoloft , Lexapro  and Prozac in the past, which were ineffective.  Right shoulder pain: She also reports chronic right shoulder pain, associated with neck pain.  Pain is intermittent, dull, worse with neck and shoulder movement.  Denies any recent injury.  She has been evaluated by orthopedic surgeon for it, had x-ray of right shoulder, which showed mild AC arthritis.  She has tried Tylenol  arthritis as needed for pain with adequate relief.  Past Medical History:  Diagnosis Date   Allergy    Anxiety    Arthritis    Cataract    Surgical removal 03/2021   Depression    Diabetes mellitus without complication  (HCC)    Diastolic dysfunction    GERD (gastroesophageal reflux disease)    Hyperlipidemia    Hypertension    Myasthenia gravis (HCC)    Patient is Jehovah's Witness    Pulmonary embolism (HCC) 05/18/2021   Sleep apnea     Past Surgical History:  Procedure Laterality Date   APPENDECTOMY     BIOPSY  11/11/2020   Procedure: BIOPSY;  Surgeon: Urban Garden, MD;  [facility]: [facility];  Service: Gastroenterology;;  duodenum gastric   BUNIONECTOMY     COLONOSCOPY WITH PROPOFOL  N/A 11/11/2020   Procedure: COLONOSCOPY WITH PROPOFOL ;  Surgeon: Urban Garden, MD;  [facility]: [facility];  Service: Gastroenterology;  Laterality: N/A;  11:15   ESOPHAGEAL DILATION  03/23/2023   Procedure: ESOPHAGEAL DILATION;  Surgeon: Urban Garden, MD;  [facility]: [facility];  Service: Gastroenterology;;   ESOPHAGEAL DILATION N/A 01/24/2024   Procedure: DILATION, ESOPHAGUS;  Surgeon: Urban Garden, MD;  [facility]: [facility];  Service: Gastroenterology;  Laterality: N/A;  9:00AM;ASA 2   ESOPHAGOGASTRODUODENOSCOPY N/A 01/24/2024   Procedure: EGD (ESOPHAGOGASTRODUODENOSCOPY);  Surgeon: Umberto Ganong, Bearl Limes, MD;  [facility]: [facility];  Service: Gastroenterology;  Laterality: N/A;  9:00AM;ASA 2   ESOPHAGOGASTRODUODENOSCOPY (EGD) WITH PROPOFOL  N/A 11/11/2020   Procedure: ESOPHAGOGASTRODUODENOSCOPY (EGD) WITH PROPOFOL ;  Surgeon: Urban Garden, MD;  [facility];  Service: Gastroenterology;  Laterality: N/A;  to arrive at 0845   ESOPHAGOGASTRODUODENOSCOPY (EGD) WITH PROPOFOL  N/A 03/23/2023   Procedure: ESOPHAGOGASTRODUODENOSCOPY (EGD) WITH PROPOFOL ;  Surgeon: Urban Garden, MD;  [facility];  Service: Gastroenterology;  Laterality: N/A;  7:30am;asa 2   EYE SURGERY  03/2021   Contaracts removed   MANDIBLE SURGERY     TONSILECTOMY, ADENOIDECTOMY, BILATERAL MYRINGOTOMY AND TUBES     TUBAL LIGATION  1990     Family History  Problem Relation Age of Onset   Arthritis Mother    Depression Mother    Diabetes Mother    Hypertension Mother    Anxiety disorder Mother    Cancer Mother    Heart disease Mother    Kidney disease Mother    Obesity Mother    Alcohol abuse Father    Hypertension Father    Heart disease Father 51       Massive MI   Diabetes Brother    Diabetes Brother    Heart disease Brother    Diabetes Brother        Type I    Cancer Brother     Social History   Socioeconomic History   Marital status: Married    Spouse name: Bambi Lever   Number of children: 4   Years of education: some college   Highest education level: Some college, no degree  Occupational History   Occupation: Retired  Tobacco Use   Smoking status: Never    Passive exposure: Never   Smokeless tobacco: Never  Vaping Use   Vaping status: Never Used  Substance and Sexual Activity   Alcohol use: Not Currently    Comment: socially on rare occasions   Drug use: No   Sexual activity: Yes    Birth control/protection: Other-see comments    Comment: postmenopausal  Other Topics Concern   Not on file  Social History Narrative   Not on file   Social Drivers of Health   Financial Resource Strain: Low Risk  (03/15/2024)   Overall Financial Resource Strain (CARDIA)    Difficulty of Paying Living Expenses: Not hard at all  Food Insecurity: No Food Insecurity (03/15/2024)   Hunger Vital Sign    Worried About Running Out of Food in the Last Year: Never true    Ran Out of Food in the Last Year: Never true  Transportation Needs: No Transportation Needs (03/15/2024)   PRAPARE - Administrator, Civil Service (Medical): No    Lack of Transportation (Non-Medical): No  Physical Activity: Insufficiently Active (03/15/2024)   Exercise Vital Sign    Days of Exercise per Week: 2 days    Minutes of Exercise per Session: 20 min  Stress: No Stress Concern Present (03/15/2024)   [employer] of  Occupational Health - Occupational Stress Questionnaire    Feeling of Stress : Only a little  Social Connections: Moderately Integrated (03/15/2024)   Social Connection and Isolation Panel    Frequency of Communication with Friends and Family: More than three times a week    Frequency of Social Gatherings with Friends and Family: More than three times a week    Attends Religious Services: More than 4 times per year    Active Member of [employer] or Organizations: No    Attends Banker Meetings: Never    Marital Status: Married  Catering manager Violence: Not At Risk (03/15/2024)   Humiliation, Afraid, Rape, and Kick questionnaire    Fear of Current or Ex-Partner: No  Emotionally Abused: No    Physically Abused: No    Sexually Abused: No    Outpatient Medications Prior to Visit  Medication Sig Dispense Refill   Blood Glucose Monitoring Suppl (BLOOD GLUCOSE SYSTEM PAK) KIT Use as directed to monitor FSBS 2x daily. Dx: E11.9 1 each 1   buPROPion  (WELLBUTRIN  SR) 200 MG 12 hr tablet Take 1 tablet (200 mg total) by mouth daily. 30 tablet 5   Cholecalciferol (VITAMIN D3) 125 MCG (5000 UT) TABS Take 5,000 Units by mouth daily.     Coenzyme Q10 (CO Q 10 PO) Take 1 capsule by mouth daily.     glucose blood (ONETOUCH VERIO) test strip USE TO TEST BLOOD SUGAR TWICE DAILY 100 strip 3   Glycerin-Hypromellose-PEG 400 (DRY EYE RELIEF DROPS) 0.2-0.2-1 % SOLN Place 1 drop into both eyes daily as needed (Dry eye). homeopathic     hyoscyamine  (LEVSIN  SL) 0.125 MG SL tablet Place 1 tablet (0.125 mg total) under the tongue every 6 (six) hours as needed (spasms). 60 tablet 1   MAGNESIUM PO Take 2 tablets by mouth daily at 2 am.     metoprolol  succinate (TOPROL -XL) 25 MG 24 hr tablet Take 1 tablet (25 mg total) by mouth daily. 90 tablet 1   MOUNJARO  5 MG/0.5ML Pen INJECT 5 MG INTO THE SKIN ONCE A WEEK. 2 mL 2   Multiple Vitamins-Minerals (MULTI COMPLETE) CAPS Take 1 capsule by mouth daily.      naproxen  (NAPROSYN ) 500 MG tablet Take 1 tablet (500 mg total) by mouth 2 (two) times daily with a meal. (Patient not taking: Reported on 03/15/2024) 60 tablet 2   naproxen  (NAPROSYN ) 500 MG tablet Take 500 mg by mouth 2 (two) times daily as needed (pain).     pantoprazole  (PROTONIX ) 40 MG tablet Take 1 tablet (40 mg total) by mouth daily. 90 tablet 3   VASCEPA  1 g capsule Take 2 capsules (2 g total) by mouth 2 (two) times daily. 360 capsule 3   Wheat Dextrin (BENEFIBER PO) Take by mouth. Takes one tablespoon tid     FARXIGA  10 MG TABS tablet TAKE ONE TABLET BY MOUTH DAILY BEFORE BREAKFAST 90 tablet 0   losartan -hydrochlorothiazide (HYZAAR) 100-12.5 MG tablet Take 1 tablet by mouth daily. 90 tablet 1   magic mouthwash (lidocaine , diphenhydrAMINE, alum & mag hydroxide) suspension Swish and swallow 10 mLs 4 (four) times daily. 360 mL 1   rosuvastatin  (CRESTOR ) 10 MG tablet Take 1 tablet (10 mg total) by mouth daily. 90 tablet 1   No facility-administered medications prior to visit.    Allergies  Allergen Reactions   Prednisolone Acetate-Nepafenac Other (See Comments)    1% eye solution Causes eye pain and severe headaches   Prozac [Fluoxetine Hcl] Anaphylaxis   Codeine Other (See Comments) and Nausea And Vomiting    Intolerance   Erythromycin Other (See Comments)   Fluoxetine Hives   Other     -mycin medications: has myasthenia gravis    Sulfa Antibiotics Hives    ROS Review of Systems  Constitutional:  Negative for chills and fever.  HENT:  Negative for congestion, sinus pressure, sinus pain and sore throat.   Eyes:  Negative for pain and discharge.  Respiratory:  Negative for cough and shortness of breath.   Cardiovascular:  Negative for chest pain and palpitations.  Gastrointestinal:  Negative for diarrhea, nausea and vomiting.  Endocrine: Negative for polydipsia and polyuria.  Genitourinary:  Negative for dysuria and hematuria.  Musculoskeletal:  Positive  for back pain.  Negative for neck pain and neck stiffness.       R shoulder pain  Skin:  Negative for rash.  Neurological:  Negative for dizziness and weakness.  Psychiatric/Behavioral:  Positive for sleep disturbance. Negative for agitation and behavioral problems. The patient is nervous/anxious.       Objective:    Physical Exam Vitals reviewed.  Constitutional:      General: She is not in acute distress.    Appearance: She is not diaphoretic.  HENT:     Head: Normocephalic and atraumatic.     Nose: Nose normal.     Mouth/Throat:     Mouth: Mucous membranes are moist.   Eyes:     General: No scleral icterus.    Extraocular Movements: Extraocular movements intact.    Cardiovascular:     Rate and Rhythm: Normal rate and regular rhythm.     Heart sounds: Normal heart sounds. No murmur heard. Pulmonary:     Breath sounds: Normal breath sounds. No wheezing or rales.   Musculoskeletal:     Right shoulder: Tenderness present. Normal range of motion.     Cervical back: Neck supple. No tenderness. Pain with movement present.     Lumbar back: Tenderness present. Decreased range of motion.     Right lower leg: No edema.     Left lower leg: No edema.   Skin:    General: Skin is warm.     Findings: No rash.   Neurological:     General: No focal deficit present.     Mental Status: She is alert and oriented to person, place, and time.     Sensory: No sensory deficit.     Motor: No weakness.   Psychiatric:        Mood and Affect: Mood normal.        Behavior: Behavior is slowed. Behavior is cooperative.     BP 115/74   Pulse 75   Ht 5' 2 (1.575 m)   Wt 185 lb 9.6 oz (84.2 kg)   SpO2 96%   BMI 33.95 kg/m  Wt Readings from Last 3 Encounters:  04/16/24 185 lb 9.6 oz (84.2 kg)  03/15/24 183 lb (83 kg)  01/24/24 180 lb (81.6 kg)    Lab Results  Component Value Date   TSH 2.460 08/16/2023   Lab Results  Component Value Date   WBC 6.2 01/09/2024   HGB 15.9 (H) 01/09/2024   HCT  46.0 01/09/2024   MCV 91.8 01/09/2024   PLT 218 01/09/2024   Lab Results  Component Value Date   NA 138 01/19/2024   K 3.7 01/19/2024   CO2 25 01/19/2024   GLUCOSE 175 (H) 01/19/2024   BUN 19 01/19/2024   CREATININE 0.87 01/19/2024   BILITOT 1.1 12/14/2023   ALKPHOS 77 12/14/2023   AST 26 12/14/2023   ALT 43 12/14/2023   PROT 7.9 12/14/2023   ALBUMIN 4.7 12/14/2023   CALCIUM  9.5 01/19/2024   ANIONGAP 10 01/19/2024   EGFR 67 08/16/2023   Lab Results  Component Value Date   CHOL 142 12/14/2023   Lab Results  Component Value Date   HDL 53 12/14/2023   Lab Results  Component Value Date   LDLCALC 48 12/14/2023   Lab Results  Component Value Date   TRIG 207 (H) 12/14/2023   Lab Results  Component Value Date   CHOLHDL 2.7 12/14/2023   Lab Results  Component Value Date   HGBA1C 7.4 (  H) 12/14/2023      Assessment & Plan:   Problem List Items Addressed This Visit       Cardiovascular and Mediastinum   Hypertension   BP Readings from Last 1 Encounters:  04/16/24 115/74   Usually well-controlled with  Losartan -HCTZ 100-12.5 mg QD and Metoprolol  25 mg QD Counseled for compliance with the medications Advised DASH diet and moderate exercise/walking, at least 150 mins/week      Relevant Medications   losartan  (COZAAR ) 100 MG tablet   rosuvastatin  (CRESTOR ) 10 MG tablet     Endocrine   Type 2 diabetes mellitus with other specified complication (HCC) - Primary   Lab Results  Component Value Date   HGBA1C 7.4 (H) 12/14/2023   Associated with HLD and HTN Well-controlled now, and she is trying to improve her diet -followed by nutritionist On Farxiga , did not tolerate Metformin  On Mounjaro  5 mg qw now Check CMP and HbA1c Advised to follow diabetic diet On ARB and statin      Relevant Medications   losartan  (COZAAR ) 100 MG tablet   rosuvastatin  (CRESTOR ) 10 MG tablet   dapagliflozin  propanediol (FARXIGA ) 10 MG TABS tablet   Other Relevant Orders    Microalbumin / creatinine urine ratio   CMP14+EGFR   Hemoglobin A1c     Other   Depression, recurrent (HCC)   Flowsheet Row Office Visit from 04/16/2024 in [facility]  PHQ-9 Total Score 9   Well-controlled, on Wellbutrin  200 mg SR QD Had severe anxiety and insomnia with Zoloft  50 mg QD, discontinued Did not have benefit with Lexapro  in the past Used to be followed by psychiatry in [place]      Hyperlipidemia   Checked lipid profile - has h/o DM and hypertriglyceridemia On Crestor  and Vascepa       Relevant Medications   losartan  (COZAAR ) 100 MG tablet   rosuvastatin  (CRESTOR ) 10 MG tablet   Right shoulder pain   Chronic right shoulder pain could be due to [facility] joint arthritis in addition to trapezius strain Likely has biceps tendinitis as well Tylenol  arthritis as needed for pain, can alternate with naproxen  If persistent, will refer to OT       Meds ordered this encounter  Medications   losartan  (COZAAR ) 100 MG tablet    Sig: Take 1 tablet (100 mg total) by mouth daily.    Dispense:  100 tablet    Refill:  2    Please discontinue Losartan -hydrochlorothiazide.   rosuvastatin  (CRESTOR ) 10 MG tablet    Sig: Take 1 tablet (10 mg total) by mouth daily.    Dispense:  90 tablet    Refill:  1   dapagliflozin  propanediol (FARXIGA ) 10 MG TABS tablet    Sig: Take 1 tablet (10 mg total) by mouth daily.    Dispense:  90 tablet    Refill:  3    Follow-up: Return in about 4 months (around 08/16/2024) for Annual physical (after 08/14/24).    Meldon Sport, MD

## 2024-04-17 ENCOUNTER — Ambulatory Visit: Payer: Self-pay | Admitting: Internal Medicine

## 2024-04-17 LAB — CMP14+EGFR
ALT: 26 IU/L (ref 0–32)
AST: 26 IU/L (ref 0–40)
Albumin: 4.9 g/dL — ABNORMAL HIGH (ref 3.8–4.8)
Alkaline Phosphatase: 97 IU/L (ref 44–121)
BUN/Creatinine Ratio: 13 (ref 12–28)
BUN: 11 mg/dL (ref 8–27)
Bilirubin Total: 0.7 mg/dL (ref 0.0–1.2)
CO2: 24 mmol/L (ref 20–29)
Calcium: 9.8 mg/dL (ref 8.7–10.3)
Chloride: 100 mmol/L (ref 96–106)
Creatinine, Ser: 0.84 mg/dL (ref 0.57–1.00)
Globulin, Total: 2 g/dL (ref 1.5–4.5)
Glucose: 93 mg/dL (ref 70–99)
Potassium: 3.6 mmol/L (ref 3.5–5.2)
Sodium: 142 mmol/L (ref 134–144)
Total Protein: 6.9 g/dL (ref 6.0–8.5)
eGFR: 74 mL/min/{1.73_m2} (ref >59)

## 2024-04-17 LAB — HEMOGLOBIN A1C
Est. average glucose Bld gHb Est-mCnc: 126 mg/dL
Hgb A1c MFr Bld: 6 % — ABNORMAL HIGH (ref 4.8–5.6)

## 2024-04-18 LAB — MICROALBUMIN / CREATININE URINE RATIO
Creatinine, Urine: 49 mg/dL
Microalb/Creat Ratio: 14 mg/g{creat} (ref 0–29)
Microalbumin, Urine: 7 ug/mL

## 2024-04-29 ENCOUNTER — Encounter (INDEPENDENT_AMBULATORY_CARE_PROVIDER_SITE_OTHER): Payer: Self-pay | Admitting: Gastroenterology

## 2024-04-29 ENCOUNTER — Ambulatory Visit (INDEPENDENT_AMBULATORY_CARE_PROVIDER_SITE_OTHER): Admitting: Gastroenterology

## 2024-04-29 VITALS — BP 116/79 | HR 82 | Temp 98.2°F | Ht 62.0 in | Wt 184.4 lb

## 2024-04-29 DIAGNOSIS — K5900 Constipation, unspecified: Secondary | ICD-10-CM

## 2024-04-29 DIAGNOSIS — K224 Dyskinesia of esophagus: Secondary | ICD-10-CM | POA: Diagnosis not present

## 2024-04-29 DIAGNOSIS — K219 Gastro-esophageal reflux disease without esophagitis: Secondary | ICD-10-CM

## 2024-04-29 NOTE — Patient Instructions (Addendum)
-  continue current fiber regimen -Increase water intake, aim for atleast 64 oz per day -Increase fruits, veggies and whole grains, kiwi and prunes are especially good for constipation -continue protonix  40mg  daily -can use levsin  as needed for esophageal spasms  Follow up 1 year  It was a pleasure to see you today. I want to create trusting relationships with patients and provide genuine, compassionate, and quality care. I truly value your feedback! please be on the lookout for a survey regarding your visit with me today. I appreciate your input about our visit and your time in completing this!    Erving Sassano L. Carling Liberman, MSN, APRN, AGNP-C Adult-Gerontology Nurse Practitioner [facility] at [facility]

## 2024-04-29 NOTE — Progress Notes (Signed)
 Referring Provider: Tobie Suzzane POUR, MD Primary Care Physician:  Tobie Suzzane POUR, MD Primary GI Physician: Dr. Eartha   Chief Complaint  Patient presents with   Follow-up    Doing well, no issues   HPI:   Carolyn [name] is a 73 y.o. female with past medical history of DM, depression HLD, Myasthenia Gravis and IBS   Patient presenting today for:  Follow up of Constipation, GERD, dysphagia, esophageal spasms  Last seen march 2025, at that time, rare breakthrough GERD symptoms on protonix  40mg  daily, having more dysphagia. Constipation manageable on benefiber, having a Bm about 5 times per week, taking levsin  on occasion for rectal spasms.   Recommended continue protonix  40mg  daily, continue levsin  PRN for esophageal/rectal spasms, increase water intake, fruits, veggies, whole grains, continue benefiber 1-2T daily, schedule EGD  Present: States constipation doing better since doing a more whole foods plant based fiber. She notes that on previous benefiber but was still having to strain a lot to go. She is having a BM usually once daily without having to strain. She tries to drink water throughout the day. Has not needed levsin  in a while for rectal spasms.  Denies rectal bleeding or  melena.   Reflux feels pretty well controlled on protonix  40 daily. She has had a few episodes of esophageal spasms but did not require any medications for this. Denies dysphagia other than when she has esophageal spasm.   No red flag symptoms. Patient denies melena, hematochezia, nausea, vomiting, diarrhea, constipation, dysphagia, odyonophagia, early satiety or weight loss.    Last Endoscopy: march 2025, esophagus normal, dilated, otherwise normal exam pH impedence testing 07/2023:The patient has a normal esophageal acid exposure time for a patient off medication measured by pH according to the [place] Cons[employer].  There is a normal reflux frequency measured by impedance. Symptom indices  as outlined  above. The clinical significance of positive indices in a  normal study is of Carolyn clinical significance and could suggest  visceral hypersensitivity.  esophageal manometry 07/2023: This is an abnormal esophageal motility study with a  hypertensive basal LES tone and an elevated median IRP.  There is evidnce  of hypercontractile peristalsis.. The IRP is elevated in both the primary  (supine) and secondary (upright) position. 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 Of note, there is an elevated UES residual pressure.  RUQ US  in June 2024 with hepatic steatosis BPE: 03/2023 abnormal esophageal contractions, recommended to continue with esophageal manometry  Last Colonoscopy: 11/11/2020, normal colon with presence of internal hemorrhoids    Filed Weights   04/29/24 1144  Weight: 184 lb 6.4 oz (83.6 kg)     Past Medical History:  Diagnosis Date   Allergy    Anxiety    Arthritis    Cataract    Surgical removal 03/2021   Depression    Diabetes mellitus without complication (HCC)    Diastolic dysfunction    GERD (gastroesophageal reflux disease)    Hyperlipidemia    Hypertension    Myasthenia gravis (HCC)    Patient is Jehovah's Witness    Pulmonary embolism (HCC) 05/18/2021   Sleep apnea     Past Surgical History:  Procedure Laterality Date   APPENDECTOMY     BIOPSY  11/11/2020   Procedure: BIOPSY;  Surgeon: Eartha Angelia Sieving, MD;  [facility]: [facility];  Service: Gastroenterology;;  duodenum gastric   BUNIONECTOMY     COLONOSCOPY WITH PROPOFOL  N/A 11/11/2020   Procedure: COLONOSCOPY WITH PROPOFOL ;  Surgeon: Eartha Angelia Sieving, MD;  Location: [facility];  Service: Gastroenterology;  Laterality: N/A;  11:15   ESOPHAGEAL DILATION  03/23/2023   Procedure: ESOPHAGEAL DILATION;  Surgeon: Eartha Angelia Sieving, MD;   Location: [facility];  Service: Gastroenterology;;   ESOPHAGEAL DILATION N/A 01/24/2024   Procedure: DILATION, ESOPHAGUS;  Surgeon: Eartha Angelia Sieving, MD;  Location: [facility];  Service: Gastroenterology;  Laterality: N/A;  9:00AM;ASA 2   ESOPHAGOGASTRODUODENOSCOPY N/A 01/24/2024   Procedure: EGD (ESOPHAGOGASTRODUODENOSCOPY);  Surgeon: Eartha Angelia, Sieving, MD;  Location: [facility];  Service: Gastroenterology;  Laterality: N/A;  9:00AM;ASA 2   ESOPHAGOGASTRODUODENOSCOPY (EGD) WITH PROPOFOL  N/A 11/11/2020   Procedure: ESOPHAGOGASTRODUODENOSCOPY (EGD) WITH PROPOFOL ;  Surgeon: Eartha Angelia Sieving, MD;  Location: [facility];  Service: Gastroenterology;  Laterality: N/A;  to arrive at 0845   ESOPHAGOGASTRODUODENOSCOPY (EGD) WITH PROPOFOL  N/A 03/23/2023   Procedure: ESOPHAGOGASTRODUODENOSCOPY (EGD) WITH PROPOFOL ;  Surgeon: Eartha Angelia Sieving, MD;  Location: [facility];  Service: Gastroenterology;  Laterality: N/A;  7:30am;asa 2   EYE SURGERY  03/2021   Contaracts removed   MANDIBLE SURGERY     TONSILECTOMY, ADENOIDECTOMY, BILATERAL MYRINGOTOMY AND TUBES     TUBAL LIGATION  1990    Current Outpatient Medications  Medication Sig Dispense Refill   amoxicillin  (AMOXIL ) 500 MG capsule Take 500 mg by mouth 3 (three) times daily.     Blood Glucose Monitoring Suppl (BLOOD GLUCOSE SYSTEM PAK) KIT Use as directed to monitor FSBS 2x daily. Dx: E11.9 1 each 1   buPROPion  (WELLBUTRIN  SR) 200 MG 12 hr tablet Take 1 tablet (200 mg total) by mouth daily. 30 tablet 5   Cholecalciferol (VITAMIN D3) 125 MCG (5000 UT) TABS Take 5,000 Units by mouth daily.     Coenzyme Q10 (CO Q 10 PO) Take 1 capsule by mouth daily.     dapagliflozin  propanediol (FARXIGA ) 10 MG TABS tablet Take 1 tablet (10 mg total) by mouth daily. 90 tablet 3   glucose blood (ONETOUCH VERIO) test strip USE TO TEST BLOOD SUGAR TWICE DAILY 100 strip 3   Glycerin-Hypromellose-PEG 400 (DRY EYE RELIEF DROPS)  0.2-0.2-1 % SOLN Place 1 drop into both eyes daily as needed (Dry eye). homeopathic     hyoscyamine  (LEVSIN  SL) 0.125 MG SL tablet Place 1 tablet (0.125 mg total) under the tongue every 6 (six) hours as needed (spasms). 60 tablet 1   losartan  (COZAAR ) 100 MG tablet Take 1 tablet (100 mg total) by mouth daily. 100 tablet 2   MAGNESIUM PO Take 2 tablets by mouth daily at 2 am.     metoprolol  succinate (TOPROL -XL) 25 MG 24 hr tablet Take 1 tablet (25 mg total) by mouth daily. 90 tablet 1   MOUNJARO  5 MG/0.5ML Pen INJECT 5 MG INTO THE SKIN ONCE A WEEK. 2 mL 2   Multiple Vitamins-Minerals (MULTI COMPLETE) CAPS Take 1 capsule by mouth daily.     pantoprazole  (PROTONIX ) 40 MG tablet Take 1 tablet (40 mg total) by mouth daily. 90 tablet 3   rosuvastatin  (CRESTOR ) 10 MG tablet Take 1 tablet (10 mg total) by mouth daily. 90 tablet 1   VASCEPA  1 g capsule Take 2 capsules (2 g total) by mouth 2 (two) times daily. 360 capsule 3   Wheat Dextrin (BENEFIBER PO) Take by mouth. Takes one tablespoon tid     No current facility-administered  medications for this visit.    Allergies as of 04/29/2024 - Review Complete 04/29/2024  Allergen Reaction Noted   Prednisolone acetate-nepafenac Other (See Comments) 10/18/2023   Prozac [fluoxetine hcl] Anaphylaxis 12/16/2015   Codeine Other (See Comments) and Nausea And Vomiting 12/16/2015   Duloxetine   02/19/2024   Erythromycin Other (See Comments) 12/16/2015   Fluoxetine Hives 10/18/2023   Other  10/30/2017   Sulfa antibiotics Hives 12/16/2015    Social History   Socioeconomic History   Marital status: Married    Spouse name: Ozell   Number of children: 4   Years of education: some college   Highest education level: Some college, no degree  Occupational History   Occupation: Retired  Tobacco Use   Smoking status: Never    Passive exposure: Never   Smokeless tobacco: Never  Vaping Use   Vaping status: Never Used  Substance and Sexual Activity   Alcohol  use: Not Currently    Comment: socially on rare occasions   Drug use: No   Sexual activity: Yes    Birth control/protection: Other-see comments    Comment: postmenopausal  Other Topics Concern   Not on file  Social History Narrative   Not on file   Social Drivers of Health   Financial Resource Strain: Low Risk  (03/15/2024)   Overall Financial Resource Strain (CARDIA)    Difficulty of Paying Living Expenses: Not hard at all  Food Insecurity: No Food Insecurity (03/15/2024)   Hunger Vital Sign    Worried About Running Out of Food in the Last Year: Never true    Ran Out of Food in the Last Year: Never true  Transportation Needs: No Transportation Needs (03/15/2024)   PRAPARE - Administrator, Civil Service (Medical): No    Lack of Transportation (Non-Medical): No  Physical Activity: Insufficiently Active (03/15/2024)   Exercise Vital Sign    Days of Exercise per Week: 2 days    Minutes of Exercise per Session: 20 min  Stress: No Stress Concern Present (03/15/2024)   [employer] of Occupational Health - Occupational Stress Questionnaire    Feeling of Stress : Only a little  Social Connections: Moderately Integrated (03/15/2024)   Social Connection and Isolation Panel    Frequency of Communication with Friends and Family: More than three times a week    Frequency of Social Gatherings with Friends and Family: More than three times a week    Attends Religious Services: More than 4 times per year    Active Member of [employer] or Organizations: No    Attends Engineer, structural: Never    Marital Status: Married    Review of systems General: negative for malaise, night sweats, fever, chills, weight loss Neck: Negative for lumps, goiter, pain and significant neck swelling Resp: Negative for cough, wheezing, dyspnea at rest CV: Negative for chest pain, leg swelling, palpitations, orthopnea GI: denies melena, hematochezia, nausea, vomiting, diarrhea, constipation,  dysphagia, odyonophagia, early satiety or unintentional weight loss. +occasional esophageal spasm  MSK: Negative for joint pain or swelling, back pain, and muscle pain. Derm: Negative for itching or rash Psych: Denies depression, anxiety, memory loss, confusion. No homicidal or suicidal ideation.  Heme: Negative for prolonged bleeding, bruising easily, and swollen nodes. Endocrine: Negative for cold or heat intolerance, polyuria, polydipsia and goiter. Neuro: negative for tremor, gait imbalance, syncope and seizures. The remainder of the review of systems is noncontributory.  Physical Exam: BP 116/79 (BP Location: Right Arm, Patient Position: Sitting,  Cuff Size: Large)   Pulse 82   Temp 98.2 F (36.8 C) (Oral)   Ht 5' 2 (1.575 m)   Wt 184 lb 6.4 oz (83.6 kg)   SpO2 97%   BMI 33.73 kg/m  General:   Alert and oriented. No distress noted. Pleasant and cooperative.  Head:  Normocephalic and atraumatic. Eyes:  Conjuctiva clear without scleral icterus. Mouth:  Oral mucosa pink and moist. Good dentition. No lesions. Heart: Normal rate and rhythm, s1 and s2 heart sounds present.  Lungs: Clear lung sounds in all lobes. Respirations equal and unlabored. Abdomen:  +BS, soft, non-tender and non-distended. No rebound or guarding. No HSM or masses noted. Derm: No palmar erythema or jaundice Msk:  Symmetrical without gross deformities. Normal posture. Extremities:  Without edema. Neurologic:  Alert and  oriented x4 Psych:  Alert and cooperative. Normal mood and affect.  Invalid input(s): 6 MONTHS   ASSESSMENT: Carolyn [name] is a 73 y.o. female presenting today for follow up of GERD, constipation and esophageal spasms, dysphagia   Constipation:doing well on plant based, whole food fiber supplement, generally having 1 BM per day without straining. Has not needed levsin  in a while for rectal spasms. No rectal bleeding or melena. Will continue with current bowel regimen.   GERD/esophageal  spasms: GERD well controlled on protonix  40mg  daily. Dysphagia has resolved since EGD with empiric dilation. Having rare esophageal spasm though is not taking levsin  for this as it usually passes spontaneously. Will continue with Protonix  40mg  daily, good reflux precautions.    PLAN:  -continue fiber supplement -Increase water intake, aim for atleast 64 oz per day -Increase fruits, veggies and whole grains, kiwi and prunes are especially good for constipation -continue protonix  40mg  daily -good reflux precautions -levsin  PRN for esophageal/rectal spasms   All questions were answered, patient verbalized understanding and is in agreement with plan as outlined above.   Follow Up: 1 year   Delshawn Stech L. Mariette, MSN, APRN, AGNP-C Adult-Gerontology Nurse Practitioner [facility]  I have reviewed the note and agree with the APP's assessment as described in this progress note  Toribio Fortune, MD Gastroenterology and Hepatology [facility]

## 2024-04-30 ENCOUNTER — Ambulatory Visit (INDEPENDENT_AMBULATORY_CARE_PROVIDER_SITE_OTHER): Admitting: Gastroenterology

## 2024-05-02 ENCOUNTER — Ambulatory Visit (INDEPENDENT_AMBULATORY_CARE_PROVIDER_SITE_OTHER): Admitting: Gastroenterology

## 2024-05-17 ENCOUNTER — Encounter: Payer: Self-pay | Admitting: Internal Medicine

## 2024-05-30 ENCOUNTER — Encounter: Payer: Self-pay | Admitting: Internal Medicine

## 2024-06-02 ENCOUNTER — Other Ambulatory Visit: Payer: Self-pay | Admitting: Internal Medicine

## 2024-06-02 DIAGNOSIS — E1169 Type 2 diabetes mellitus with other specified complication: Secondary | ICD-10-CM

## 2024-06-02 MED ORDER — LANCETS MISC. MISC
1.0000 | Freq: Three times a day (TID) | 3 refills | Status: AC
Start: 2024-06-02 — End: 2024-07-02

## 2024-06-02 MED ORDER — BLOOD GLUCOSE TEST VI STRP: 1.0000 | ORAL_STRIP | Freq: Three times a day (TID) | 3 refills | Status: AC

## 2024-06-02 MED ORDER — BLOOD GLUCOSE MONITORING SUPPL DEVI
1.0000 | Freq: Three times a day (TID) | 0 refills | Status: AC
Start: 2024-06-02 — End: ?

## 2024-06-03 ENCOUNTER — Other Ambulatory Visit: Payer: Self-pay | Admitting: Internal Medicine

## 2024-06-03 DIAGNOSIS — E1169 Type 2 diabetes mellitus with other specified complication: Secondary | ICD-10-CM

## 2024-06-12 ENCOUNTER — Ambulatory Visit: Admitting: Nutrition

## 2024-06-17 ENCOUNTER — Other Ambulatory Visit: Payer: Self-pay | Admitting: Internal Medicine

## 2024-06-17 DIAGNOSIS — F339 Major depressive disorder, recurrent, unspecified: Secondary | ICD-10-CM

## 2024-07-19 ENCOUNTER — Ambulatory Visit: Payer: Self-pay

## 2024-07-19 NOTE — Telephone Encounter (Signed)
 FYI Only or Action Required?: FYI only for provider.  Patient was last seen in primary care on 04/16/2024 by Tobie Suzzane POUR, MD.  Called Nurse Triage reporting Urinary Tract Infection.  Symptoms began a week ago.  Interventions attempted: OTC medications: tylenol  and pain patch.  Symptoms are: gradually worsening.  Triage Disposition: See HCP Within 4 Hours (Or PCP Triage)  Patient/caregiver understands and will follow disposition?: YesCopied from CRM [id number]. Topic: Clinical - Red Word Triage >> Jul 19, 2024  1:26 PM Charlet HERO wrote: Red Word that prompted transfer to Nurse Triage: Pain in her back where her kidneys are and she is stating that she believes she has a uti and would like to get a test to see. [facility] Reason for Disposition  Side (flank) or lower back pain present  Answer Assessment - Initial Assessment Questions I have a weak bladder. Sometimes I go and think I'm done but turns out I'm not. Pain comes and goes. 50% of time I'm in agony. Pt has tried homeopathic medication, tylenol , and pain patch. No appts in office or surrounding offices. RN advised UC. Pt stated she will go. Pt is very upset over pain level and leaves for vacation tomorrow.      1. SYMPTOM: What's the main symptom you're concerned about? (e.g., frequency, incontinence)     Lower back pain  2. ONSET: When did the    start?     Over a week  3. PAIN: Is there any pain? If Yes, ask: How bad is it? (Scale: 1-10; mild, moderate, severe)     10 4. CAUSE: What do you think is causing the symptoms?     Not sure 5. OTHER SYMPTOMS: Do you have any other symptoms? (e.g., blood in urine, fever, flank pain, pain with urination) Not emptying bladder, confusion  Protocols used: Urinary Symptoms-A-AH

## 2024-08-14 ENCOUNTER — Encounter (INDEPENDENT_AMBULATORY_CARE_PROVIDER_SITE_OTHER): Payer: Self-pay | Admitting: Gastroenterology

## 2024-08-21 ENCOUNTER — Ambulatory Visit (INDEPENDENT_AMBULATORY_CARE_PROVIDER_SITE_OTHER): Admitting: Internal Medicine

## 2024-08-21 ENCOUNTER — Encounter: Payer: Self-pay | Admitting: Internal Medicine

## 2024-08-21 VITALS — BP 116/67 | HR 72 | Ht 62.0 in | Wt 186.2 lb

## 2024-08-21 DIAGNOSIS — N3946 Mixed incontinence: Secondary | ICD-10-CM

## 2024-08-21 DIAGNOSIS — Z23 Encounter for immunization: Secondary | ICD-10-CM

## 2024-08-21 DIAGNOSIS — F339 Major depressive disorder, recurrent, unspecified: Secondary | ICD-10-CM | POA: Diagnosis not present

## 2024-08-21 DIAGNOSIS — E1169 Type 2 diabetes mellitus with other specified complication: Secondary | ICD-10-CM

## 2024-08-21 DIAGNOSIS — Z0001 Encounter for general adult medical examination with abnormal findings: Secondary | ICD-10-CM

## 2024-08-21 DIAGNOSIS — I10 Essential (primary) hypertension: Secondary | ICD-10-CM

## 2024-08-21 DIAGNOSIS — E559 Vitamin D deficiency, unspecified: Secondary | ICD-10-CM

## 2024-08-21 DIAGNOSIS — Z7985 Long-term (current) use of injectable non-insulin antidiabetic drugs: Secondary | ICD-10-CM

## 2024-08-21 DIAGNOSIS — E782 Mixed hyperlipidemia: Secondary | ICD-10-CM

## 2024-08-21 MED ORDER — WELLBUTRIN SR 200 MG PO TB12: 200.0000 mg | ORAL_TABLET | Freq: Every day | ORAL | 3 refills | Status: AC

## 2024-08-21 MED ORDER — MOUNJARO 5 MG/0.5ML ~~LOC~~ SOAJ: 5.0000 mg | SUBCUTANEOUS | 1 refills | Status: DC

## 2024-08-21 NOTE — Assessment & Plan Note (Addendum)
 Has chronic urinary frequency/urgency, has tried Oxybutynin  Referred to UroGynecology by her Ob.Gyn.

## 2024-08-21 NOTE — Assessment & Plan Note (Signed)
 BP Readings from Last 1 Encounters:  08/21/24 116/67   Usually well-controlled with  Losartan  100 mg QD and Metoprolol  25 mg QD Due to tightly controlled BP at home, advised to take only half tablet of Losartan  for now Counseled for compliance with the medications Advised DASH diet and moderate exercise/walking, at least 150 mins/week

## 2024-08-21 NOTE — Patient Instructions (Signed)
 Please start taking Losartan  half tablet once daily.  Please continue to take medications as prescribed.  Please continue to follow low carb diet and perform moderate exercise/walking as tolerated.

## 2024-08-21 NOTE — Assessment & Plan Note (Addendum)
 Flowsheet Row Office Visit from 08/21/2024 in [facility]  PHQ-9 Total Score 0   Well-controlled, on Wellbutrin  200 mg SR QD Did not get optimal response to generic bupropion  previously Had severe anxiety and insomnia with Zoloft  50 mg QD, discontinued Did not have benefit with Lexapro , Zoloft  and Prozac in the past Used to be followed by psychiatry in [place]

## 2024-08-21 NOTE — Assessment & Plan Note (Addendum)
 Check lipid profile - has h/o DM and hypertriglyceridemia On Crestor  and Vascepa 

## 2024-08-21 NOTE — Assessment & Plan Note (Signed)
 Lab Results  Component Value Date   HGBA1C 6.0 (H) 04/16/2024   Associated with HLD and HTN Well-controlled now, and she is trying to improve her diet -followed by nutritionist On Farxiga , did not tolerate Metformin  On Mounjaro  5 mg qw now Check CMP and HbA1c Advised to follow diabetic diet On ARB and statin

## 2024-08-21 NOTE — Assessment & Plan Note (Signed)
Physical exam as documented. Fasting blood tests today. Flu vaccine today. 

## 2024-08-22 ENCOUNTER — Ambulatory Visit: Payer: Self-pay | Admitting: Internal Medicine

## 2024-08-22 LAB — LIPID PANEL
Chol/HDL Ratio: 2.4 ratio (ref 0.0–4.4)
Cholesterol, Total: 130 mg/dL (ref 100–199)
HDL: 55 mg/dL (ref >39)
LDL Chol Calc (NIH): 53 mg/dL (ref 0–99)
Triglycerides: 122 mg/dL (ref 0–149)
VLDL Cholesterol Cal: 22 mg/dL (ref 5–40)

## 2024-08-22 LAB — CMP14+EGFR
ALT: 39 IU/L — ABNORMAL HIGH (ref 0–32)
AST: 33 IU/L (ref 0–40)
Albumin: 4.8 g/dL (ref 3.8–4.8)
Alkaline Phosphatase: 80 IU/L (ref 49–135)
BUN/Creatinine Ratio: 12 (ref 12–28)
BUN: 11 mg/dL (ref 8–27)
Bilirubin Total: 0.7 mg/dL (ref 0.0–1.2)
CO2: 25 mmol/L (ref 20–29)
Calcium: 10 mg/dL (ref 8.7–10.3)
Chloride: 102 mmol/L (ref 96–106)
Creatinine, Ser: 0.89 mg/dL (ref 0.57–1.00)
Globulin, Total: 1.9 g/dL (ref 1.5–4.5)
Glucose: 85 mg/dL (ref 70–99)
Potassium: 4.5 mmol/L (ref 3.5–5.2)
Sodium: 143 mmol/L (ref 134–144)
Total Protein: 6.7 g/dL (ref 6.0–8.5)
eGFR: 69 mL/min/1.73 (ref >59)

## 2024-08-22 LAB — CBC WITH DIFFERENTIAL/PLATELET
Basophils Absolute: 0 x10E3/uL (ref 0.0–0.2)
Basos: 1 %
EOS (ABSOLUTE): 0.1 x10E3/uL (ref 0.0–0.4)
Eos: 2 %
Hematocrit: 47.1 % — ABNORMAL HIGH (ref 34.0–46.6)
Hemoglobin: 16 g/dL — ABNORMAL HIGH (ref 11.1–15.9)
Immature Grans (Abs): 0 x10E3/uL (ref 0.0–0.1)
Immature Granulocytes: 0 %
Lymphocytes Absolute: 2 x10E3/uL (ref 0.7–3.1)
Lymphs: 32 %
MCH: 32.7 pg (ref 26.6–33.0)
MCHC: 34 g/dL (ref 31.5–35.7)
MCV: 96 fL (ref 79–97)
Monocytes Absolute: 0.6 x10E3/uL (ref 0.1–0.9)
Monocytes: 9 %
Neutrophils Absolute: 3.5 x10E3/uL (ref 1.4–7.0)
Neutrophils: 56 %
Platelets: 203 x10E3/uL (ref 150–450)
RBC: 4.89 x10E6/uL (ref 3.77–5.28)
RDW: 13.3 % (ref 11.7–15.4)
WBC: 6.2 x10E3/uL (ref 3.4–10.8)

## 2024-08-22 LAB — TSH: TSH: 1.89 u[IU]/mL (ref 0.450–4.500)

## 2024-08-22 LAB — HEMOGLOBIN A1C
Est. average glucose Bld gHb Est-mCnc: 117 mg/dL
Hgb A1c MFr Bld: 5.7 % — ABNORMAL HIGH (ref 4.8–5.6)

## 2024-08-22 LAB — VITAMIN D 25 HYDROXY (VIT D DEFICIENCY, FRACTURES): Vit D, 25-Hydroxy: 75.3 ng/mL (ref 30.0–100.0)

## 2024-08-23 ENCOUNTER — Ambulatory Visit: Payer: Medicare Other | Admitting: Urology

## 2024-09-02 ENCOUNTER — Encounter: Payer: Self-pay | Admitting: Radiology

## 2024-09-16 ENCOUNTER — Other Ambulatory Visit: Payer: Self-pay | Admitting: Internal Medicine

## 2024-09-16 DIAGNOSIS — I10 Essential (primary) hypertension: Secondary | ICD-10-CM

## 2024-10-01 ENCOUNTER — Ambulatory Visit (HOSPITAL_BASED_OUTPATIENT_CLINIC_OR_DEPARTMENT_OTHER)

## 2024-10-08 ENCOUNTER — Telehealth: Payer: Self-pay

## 2024-10-08 DIAGNOSIS — E782 Mixed hyperlipidemia: Secondary | ICD-10-CM

## 2024-10-08 NOTE — Progress Notes (Signed)
 Pharmacy Quality Measure Review  This patient is appearing on a report for being at risk of failing the adherence measure for cholesterol (statin) medications this calendar year.   Medication: Rosuvastatin  Last fill date: 08/12 for 90 day supply  Attempted call x2, unable to reach patient. Sent MyChart message.  Cypress Fanfan, PharmD [facility] Pharmacist

## 2024-10-09 ENCOUNTER — Encounter: Payer: Self-pay | Admitting: Internal Medicine

## 2024-10-09 ENCOUNTER — Other Ambulatory Visit: Payer: Self-pay | Admitting: Internal Medicine

## 2024-10-09 DIAGNOSIS — H1031 Unspecified acute conjunctivitis, right eye: Secondary | ICD-10-CM

## 2024-10-09 MED ORDER — OFLOXACIN 0.3 % OP SOLN: 1.0000 [drp] | Freq: Four times a day (QID) | OPHTHALMIC | 0 refills | Status: DC

## 2024-10-09 NOTE — Telephone Encounter (Signed)
 Carolyn [name] will call

## 2024-10-09 NOTE — Progress Notes (Signed)
 Spoke to the patient.  She has right sided eye redness and discomfort.  Denies any eyelid swelling.  Due to unilateral conjunctival erythema, ofloxacin eyedrop prescribed.

## 2024-10-16 ENCOUNTER — Encounter (HOSPITAL_BASED_OUTPATIENT_CLINIC_OR_DEPARTMENT_OTHER): Payer: Self-pay

## 2024-10-16 ENCOUNTER — Ambulatory Visit (HOSPITAL_BASED_OUTPATIENT_CLINIC_OR_DEPARTMENT_OTHER)

## 2024-10-16 VITALS — BP 137/83 | HR 69 | Wt 189.0 lb

## 2024-10-16 DIAGNOSIS — G4733 Obstructive sleep apnea (adult) (pediatric): Secondary | ICD-10-CM

## 2024-10-16 NOTE — Patient Instructions (Signed)
 Continue CPAP usage nightly with goal of at least 4-6 hours or more.  Plan for follow up in one year; sooner if new complaints or issues.

## 2024-10-16 NOTE — Progress Notes (Signed)
 @Patient  ID: Carolyn [name], female    DOB: [birth year], 73 y.o.   MRN: [id number]  Chief Complaint  Patient presents with   Sleep Apnea    Follow up     Referring provider: Tobie Suzzane POUR, MD  HPI: Discussed the use of AI scribe software for clinical note transcription with the patient, who gave verbal consent to proceed.  History of Present Illness Carolyn [name] is a 73 year old female with sleep apnea who presents for follow-up regarding her CPAP machine issues.  She is experiencing issues with her CPAP machine, specifically with the water reservoir, which started leaking after a few nights of use. Attempts to resolve the issue included swapping it out with the DME provider and purchasing a new one from [employer], but the problem persisted. The [employer] replacement is difficult to remove, suggesting it may not be a brand-specific part. She has resorted to using her husband's old reservoir, which has not caused any issues.  However, she has had several nights where she could not use the machine due to the reservoir issue, resulting in dry conditions that made it uncomfortable to wear. She has been adjusting the humidity settings but has returned to her previous settings as they were effective.  She does not experience additional dryness from the CPAP machine. She reports wearing the machine every night for at least four to six hours.  Residual AHI of 0.6/hr and mildly elevated leak profile at 23.  Last OV:  09/26/2023 Follow up : OSA  Patient complains for 1 year follow-up.  She has severe obstructive sleep apnea is on CPAP.  Says she is doing well on CPAP.  Feels that she is rested .  Feels that she benefits from CPAP with decreased daytime sleepiness.  Wears her CPAP every single night.  Uses nasal pillows.  Her CPAP is about 73 years old.  She would like to change from [facility] to [facility] for insurance reasons. CPAP download shows excellent compliance with 100% usage.   Daily average usage at 6 hours.  She is on auto CPAP 5 to 10 cm H2O.  Daily average pressure 9.8 cm H2O.  AHI 0.5/hour Patient remains active and fully independent.   TEST/EVENTS :  CT angio chest 05/18/21 >> small distal segmental and subsegmental PE in RLL   Sleep Tests:  HST 10/27/15 >> AHI 45 New CPAP 2023 Change from [facility] to [facility] 09/26/23  Allergies[1]  Immunization History  Administered Date(s) Administered   Fluad Quad(high Dose 65+) 09/15/2021, 08/02/2022   Fluad Trivalent(High Dose 65+) 08/15/2023   INFLUENZA, HIGH DOSE SEASONAL PF 08/21/2024   Moderna SARS-COV2 Booster Vaccination 11/14/2020   Moderna Sars-Covid-2 Vaccination 04/14/2020, 05/12/2020   PNEUMOCOCCAL CONJUGATE-20 09/27/2021   Pfizer(Comirnaty)Fall Seasonal Vaccine 12 years and older 09/23/2023   Tdap 08/30/2023   Zoster Recombinant(Shingrix) 07/01/2022, 09/14/2022    Past Medical History:  Diagnosis Date   Allergy    Anxiety    Arthritis    Cataract    Surgical removal 03/2021   Depression    Diabetes mellitus without complication (HCC)    Diastolic dysfunction    GERD (gastroesophageal reflux disease)    Hyperlipidemia    Hypertension    Myasthenia gravis (HCC)    Patient is Jehovah's Witness    Pulmonary embolism (HCC) 05/18/2021   Sleep apnea     Tobacco History: Tobacco Use History[2] Counseling given: Not Answered   Outpatient Medications Prior to Visit  Medication Sig Dispense  Refill   Blood Glucose Monitoring Suppl DEVI 1 each by Does not apply route in the morning, at noon, and at bedtime. May substitute to any manufacturer covered by patient's insurance. 1 each 0   Cholecalciferol (VITAMIN D3) 125 MCG (5000 UT) TABS Take 5,000 Units by mouth daily.     Coenzyme Q10 (CO Q 10 PO) Take 1 capsule by mouth daily.     dapagliflozin  propanediol (FARXIGA ) 10 MG TABS tablet Take 1 tablet (10 mg total) by mouth daily. 90 tablet 3   Glucose Blood (BLOOD GLUCOSE TEST STRIPS)  STRP 1 each by In Vitro route in the morning, at noon, and at bedtime. May substitute to any manufacturer covered by patient's insurance. 100 strip 3   Glycerin-Hypromellose-PEG 400 (DRY EYE RELIEF DROPS) 0.2-0.2-1 % SOLN Place 1 drop into both eyes daily as needed (Dry eye). homeopathic     hyoscyamine  (LEVSIN  SL) 0.125 MG SL tablet Place 1 tablet (0.125 mg total) under the tongue every 6 (six) hours as needed (spasms). 60 tablet 1   losartan  (COZAAR ) 100 MG tablet Take 1 tablet (100 mg total) by mouth daily. 100 tablet 2   MAGNESIUM PO Take 2 tablets by mouth daily at 2 am.     metoprolol  succinate (TOPROL -XL) 25 MG 24 hr tablet TAKE ONE TABLET BY MOUTH DAILY 90 tablet 0   Multiple Vitamins-Minerals (MULTI COMPLETE) CAPS Take 1 capsule by mouth daily.     pantoprazole  (PROTONIX ) 40 MG tablet Take 1 tablet (40 mg total) by mouth daily. 90 tablet 3   rosuvastatin  (CRESTOR ) 10 MG tablet Take 1 tablet (10 mg total) by mouth daily. 90 tablet 1   tirzepatide  (MOUNJARO ) 5 MG/0.5ML Pen Inject 5 mg into the skin once a week. 6 mL 1   VASCEPA  1 g capsule Take 2 capsules (2 g total) by mouth 2 (two) times daily. 360 capsule 3   WELLBUTRIN  SR 200 MG 12 hr tablet Take 1 tablet (200 mg total) by mouth daily. 90 tablet 3   Wheat Dextrin (BENEFIBER PO) Take by mouth. Takes one tablespoon tid     amoxicillin  (AMOXIL ) 500 MG capsule Take 500 mg by mouth 3 (three) times daily. (Patient not taking: Reported on 10/16/2024)     ofloxacin  (OCUFLOX ) 0.3 % ophthalmic solution Place 1 drop into the right eye 4 (four) times daily. 5 mL 0   No facility-administered medications prior to visit.     Review of Systems: as per hpi  Constitutional:   No  weight loss, night sweats,  Fevers, chills, fatigue, or  lassitude.  HEENT:   No headaches,  Difficulty swallowing,  Tooth/dental problems, or  Sore throat,                No sneezing, itching, ear ache, nasal congestion, post nasal drip,   CV:  No chest pain,   Orthopnea, PND, swelling in lower extremities, anasarca, dizziness, palpitations, syncope.   GI  No heartburn, indigestion, abdominal pain, nausea, vomiting, diarrhea, change in bowel habits, loss of appetite, bloody stools.   Resp: No shortness of breath with exertion or at rest.  No excess mucus, no productive cough,  No non-productive cough,  No coughing up of blood.  No change in color of mucus.  No wheezing.  No chest wall deformity  Skin: no rash or lesions.  GU: no dysuria, change in color of urine, no urgency or frequency.  No flank pain, no hematuria   MS:  No joint pain or  swelling.  No decreased range of motion.  No back pain.    Physical Exam  BP 137/83   Pulse 69   Wt 189 lb (85.7 kg)   SpO2 97%   BMI 34.57 kg/m   GEN: A/Ox3; pleasant , NAD, well nourished    HEENT:  [place]/AT,  EACs-clear, TMs-wnl, NOSE-clear, THROAT-clear, no lesions, no postnasal drip or exudate noted. Mallampati 3  NECK:  Supple w/ fair ROM; no JVD; normal carotid impulses w/o bruits; no thyromegaly or nodules palpated; no lymphadenopathy.    RESP  Clear  P & A; w/o, wheezes/ rales/ or rhonchi. no accessory muscle use, no dullness to percussion  CARD:  RRR, no m/r/g, no peripheral edema, pulses intact, no cyanosis or clubbing.  GI:   Soft & nt; nml bowel sounds; no organomegaly or masses detected.   Musco: Warm bil, no deformities or joint swelling noted.   Neuro: alert, no focal deficits noted.    Skin: Warm, no lesions or rashes    Lab Results:  CBC    Component Value Date/Time   WBC 6.2 08/21/2024 1114   WBC 6.2 01/09/2024 0909   RBC 4.89 08/21/2024 1114   RBC 5.01 01/09/2024 0909   HGB 16.0 (H) 08/21/2024 1114   HCT 47.1 (H) 08/21/2024 1114   PLT 203 08/21/2024 1114   MCV 96 08/21/2024 1114   MCH 32.7 08/21/2024 1114   MCH 31.7 01/09/2024 0909   MCHC 34.0 08/21/2024 1114   MCHC 34.6 01/09/2024 0909   RDW 13.3 08/21/2024 1114   LYMPHSABS 2.0 08/21/2024 1114   EOSABS 0.1  08/21/2024 1114   BASOSABS 0.0 08/21/2024 1114    BMET    Component Value Date/Time   NA 143 08/21/2024 1114   K 4.5 08/21/2024 1114   CL 102 08/21/2024 1114   CO2 25 08/21/2024 1114   GLUCOSE 85 08/21/2024 1114   GLUCOSE 175 (H) 01/19/2024 1343   BUN 11 08/21/2024 1114   CREATININE 0.89 08/21/2024 1114   CREATININE 0.87 12/28/2020 0827   CALCIUM  10.0 08/21/2024 1114   GFRNONAA >60 01/19/2024 1343   GFRNONAA 68 12/28/2020 0827   GFRAA 79 12/28/2020 0827    BNP No results found for: BNP  ProBNP No results found for: PROBNP  Imaging: No results found.  Administration History     None           No data to display          No results found for: NITRICOXIDE   Assessment & Plan:   Assessment & Plan OSA on CPAP  Assessment and Plan Assessment & Plan Obstructive sleep apnea Well-controlled with CPAP therapy. Compliance report shows 0.6 events per hour. Occasional higher leak rates noted but within acceptable limits on average. - Continue CPAP therapy.  Goal of nightly usage of at least 4-6 hours or more. - Ensure water reservoir functions properly to prevent dryness. - Maintain current humidity settings.    Return in about 1 year (around 10/16/2025) for CPAP download.  Candis Dandy, PA-C 10/16/2024      [1]  Allergies Allergen Reactions   Prednisolone Acetate-Nepafenac Other (See Comments)    1% eye solution Causes eye pain and severe headaches   Prozac [Fluoxetine Hcl] Anaphylaxis   Codeine Other (See Comments) and Nausea And Vomiting    Intolerance   Duloxetine     Erythromycin Other (See Comments)   Fluoxetine Hives   Other     -mycin medications: has myasthenia gravis  Sulfa Antibiotics Hives  [2]  Social History Tobacco Use  Smoking Status Never   Passive exposure: Never  Smokeless Tobacco Never

## 2024-11-19 ENCOUNTER — Other Ambulatory Visit (HOSPITAL_COMMUNITY): Payer: Self-pay

## 2024-11-19 ENCOUNTER — Telehealth: Payer: Self-pay | Admitting: Pharmacy Technician

## 2024-11-19 NOTE — Telephone Encounter (Signed)
 Pharmacy Patient Advocate Encounter   Received notification from [facility] KEY that prior authorization for Mounjaro  5mg  is required/requested.   Insurance verification completed.   The patient is insured through [place].   Per test claim: PA required; PA submitted to above mentioned insurance via Latent Key/confirmation #/EOC [id number] Status is pending

## 2024-11-21 ENCOUNTER — Other Ambulatory Visit (HOSPITAL_COMMUNITY): Payer: Self-pay

## 2024-11-21 NOTE — Telephone Encounter (Signed)
 Pharmacy Patient Advocate Encounter  Received notification from [employer] that Prior Authorization for Mounjaro  5mg  has been APPROVED from 10/31/24 to 10/30/25. Ran test claim, Copay is $141. This test claim was processed through [facility]- copay amounts may vary at other pharmacies due to pharmacy/plan contracts, or as the patient moves through the different stages of their insurance plan.   PA #/Case ID/Reference #: [id number]

## 2024-11-22 ENCOUNTER — Telehealth: Payer: Self-pay

## 2024-11-22 DIAGNOSIS — E1169 Type 2 diabetes mellitus with other specified complication: Secondary | ICD-10-CM

## 2024-11-22 MED ORDER — MOUNJARO 5 MG/0.5ML ~~LOC~~ SOAJ: 5.0000 mg | SUBCUTANEOUS | 0 refills | Status: DC

## 2024-11-22 NOTE — Telephone Encounter (Signed)
 Copied from CRM [id number]. Topic: Clinical - Medication Refill >> Nov 22, 2024  1:47 PM Zebedee SAUNDERS wrote: Medication: tirzepatide  (MOUNJARO ) 5 MG/0.5ML Pen  Has the patient contacted their pharmacy? Yes (Agent: If no, request that the patient contact the pharmacy for the refill. If patient does not wish to contact the pharmacy document the reason why and proceed with request.) (Agent: If yes, when and what did the pharmacy advise?)  This is the patient's preferred pharmacy:  [facility] - [place], [place] - [address] [place] [address] Phone: [phone] Fax: [phone]  Is this the correct pharmacy for this prescription? Yes If no, delete pharmacy and type the correct one.   Has the prescription been filled recently? Yes  Is the patient out of the medication? Yes  Has the patient been seen for an appointment in the last year OR does the patient have an upcoming appointment? Yes  Can we respond through MyChart? Yes  Agent: Please be advised that Rx refills may take up to 3 business days. We ask that you follow-up with your pharmacy.

## 2024-11-22 NOTE — Telephone Encounter (Signed)
 Mounjaro  refilled x 1 month

## 2024-11-22 NOTE — Addendum Note (Signed)
 Addended by: ANTONETTA ROLLENE BRAVO on: 11/22/2024 05:20 PM   Modules accepted: Orders

## 2024-12-04 ENCOUNTER — Other Ambulatory Visit: Payer: Self-pay | Admitting: Internal Medicine

## 2024-12-04 DIAGNOSIS — E1169 Type 2 diabetes mellitus with other specified complication: Secondary | ICD-10-CM

## 2024-12-04 MED ORDER — MOUNJARO 5 MG/0.5ML ~~LOC~~ SOAJ: 5.0000 mg | SUBCUTANEOUS | 3 refills | Status: AC

## 2024-12-06 ENCOUNTER — Other Ambulatory Visit (HOSPITAL_COMMUNITY): Payer: Self-pay

## 2024-12-26 ENCOUNTER — Ambulatory Visit: Admitting: Internal Medicine

## 2025-03-18 ENCOUNTER — Ambulatory Visit
